# Patient Record
Sex: Male | Born: 1975 | Race: Black or African American | Hispanic: No | Marital: Married | Smoking: Current every day smoker
Health system: Southern US, Community
[De-identification: ages and names within clinical notes are randomized; demographics above are authoritative.]

## PROBLEM LIST (undated history)

## (undated) DIAGNOSIS — Z8719 Personal history of other diseases of the digestive system: Secondary | ICD-10-CM

## (undated) DIAGNOSIS — I10 Essential (primary) hypertension: Secondary | ICD-10-CM

## (undated) DIAGNOSIS — K7460 Unspecified cirrhosis of liver: Secondary | ICD-10-CM

## (undated) DIAGNOSIS — E119 Type 2 diabetes mellitus without complications: Secondary | ICD-10-CM

## (undated) DIAGNOSIS — K760 Fatty (change of) liver, not elsewhere classified: Secondary | ICD-10-CM

## (undated) DIAGNOSIS — K863 Pseudocyst of pancreas: Secondary | ICD-10-CM

## (undated) DIAGNOSIS — M1990 Unspecified osteoarthritis, unspecified site: Secondary | ICD-10-CM

## (undated) DIAGNOSIS — IMO0001 Reserved for inherently not codable concepts without codable children: Secondary | ICD-10-CM

## (undated) DIAGNOSIS — K859 Acute pancreatitis without necrosis or infection, unspecified: Secondary | ICD-10-CM

## (undated) DIAGNOSIS — K219 Gastro-esophageal reflux disease without esophagitis: Secondary | ICD-10-CM

## (undated) HISTORY — DX: Fatty (change of) liver, not elsewhere classified: K76.0

## (undated) HISTORY — DX: Reserved for inherently not codable concepts without codable children: IMO0001

## (undated) HISTORY — DX: Pseudocyst of pancreas: K86.3

## (undated) HISTORY — PX: NECK SURGERY: SHX720

## (undated) HISTORY — DX: Personal history of other diseases of the digestive system: Z87.19

## (undated) HISTORY — DX: Unspecified cirrhosis of liver: K74.60

## (undated) HISTORY — DX: Gastro-esophageal reflux disease without esophagitis: K21.9

## (undated) HISTORY — DX: Acute pancreatitis without necrosis or infection, unspecified: K85.90

## (undated) HISTORY — PX: SPINAL FUSION: SHX223

---

## 2013-02-19 ENCOUNTER — Emergency Department (HOSPITAL_COMMUNITY): Payer: BC Managed Care – PPO

## 2013-02-19 ENCOUNTER — Encounter (HOSPITAL_COMMUNITY): Payer: Self-pay

## 2013-02-19 ENCOUNTER — Emergency Department (HOSPITAL_COMMUNITY)
Admission: EM | Admit: 2013-02-19 | Discharge: 2013-02-20 | Disposition: A | Discharge status: Home / Self Care | Payer: BC Managed Care – PPO | Attending: Emergency Medicine | Admitting: Emergency Medicine

## 2013-02-19 DIAGNOSIS — Z79899 Other long term (current) drug therapy: Secondary | ICD-10-CM | POA: Insufficient documentation

## 2013-02-19 DIAGNOSIS — K2980 Duodenitis without bleeding: Secondary | ICD-10-CM

## 2013-02-19 DIAGNOSIS — F17200 Nicotine dependence, unspecified, uncomplicated: Secondary | ICD-10-CM | POA: Insufficient documentation

## 2013-02-19 DIAGNOSIS — I10 Essential (primary) hypertension: Secondary | ICD-10-CM | POA: Insufficient documentation

## 2013-02-19 DIAGNOSIS — E119 Type 2 diabetes mellitus without complications: Secondary | ICD-10-CM | POA: Insufficient documentation

## 2013-02-19 DIAGNOSIS — R109 Unspecified abdominal pain: Secondary | ICD-10-CM

## 2013-02-19 HISTORY — DX: Type 2 diabetes mellitus without complications: E11.9

## 2013-02-19 HISTORY — DX: Essential (primary) hypertension: I10

## 2013-02-19 LAB — URINALYSIS, ROUTINE W REFLEX MICROSCOPIC
Bilirubin Urine: NEGATIVE
Hgb urine dipstick: NEGATIVE
Nitrite: NEGATIVE
Protein, ur: NEGATIVE mg/dL
Urobilinogen, UA: 0.2 mg/dL (ref 0.0–1.0)

## 2013-02-19 LAB — CBC WITH DIFFERENTIAL/PLATELET
Eosinophils Absolute: 0.2 10*3/uL (ref 0.0–0.7)
Eosinophils Relative: 2 % (ref 0–5)
Hemoglobin: 15.6 g/dL (ref 13.0–17.0)
Lymphocytes Relative: 14 % (ref 12–46)
Lymphs Abs: 1 10*3/uL (ref 0.7–4.0)
MCH: 32.2 pg (ref 26.0–34.0)
MCV: 88.9 fL (ref 78.0–100.0)
Monocytes Relative: 8 % (ref 3–12)
Platelets: 223 10*3/uL (ref 150–400)
RBC: 4.85 MIL/uL (ref 4.22–5.81)
WBC: 7.2 10*3/uL (ref 4.0–10.5)

## 2013-02-19 LAB — COMPREHENSIVE METABOLIC PANEL
ALT: 28 U/L (ref 0–53)
Alkaline Phosphatase: 67 U/L (ref 39–117)
BUN: 12 mg/dL (ref 6–23)
CO2: 29 mEq/L (ref 19–32)
Calcium: 9.4 mg/dL (ref 8.4–10.5)
GFR calc Af Amer: >90 mL/min (ref >90)
GFR calc non Af Amer: 90 mL/min — ABNORMAL LOW (ref >90)
Glucose, Bld: 201 mg/dL — ABNORMAL HIGH (ref 70–99)
Potassium: 4 mEq/L (ref 3.5–5.1)
Sodium: 132 mEq/L — ABNORMAL LOW (ref 135–145)
Total Protein: 7.6 g/dL (ref 6.0–8.3)

## 2013-02-19 LAB — LIPASE, BLOOD: Lipase: 35 U/L (ref 11–59)

## 2013-02-19 MED ORDER — ONDANSETRON HCL 4 MG/2ML IJ SOLN
INTRAMUSCULAR | Status: AC
  Administered 2013-02-19: 4 mg
  Filled 2013-02-19: qty 2

## 2013-02-19 MED ORDER — ONDANSETRON HCL 4 MG/2ML IJ SOLN: 4.0000 mg | Freq: Once | INTRAMUSCULAR | Status: AC

## 2013-02-19 MED ORDER — MORPHINE SULFATE 4 MG/ML IJ SOLN
4.0000 mg | Freq: Once | INTRAMUSCULAR | Status: AC
  Administered 2013-02-19: 4 mg via INTRAVENOUS
  Filled 2013-02-19: qty 1

## 2013-02-19 MED ORDER — SODIUM CHLORIDE 0.9 % IV BOLUS (SEPSIS)
1000.0000 mL | Freq: Once | INTRAVENOUS | Status: AC
  Administered 2013-02-19: 1000 mL via INTRAVENOUS

## 2013-02-19 NOTE — ED Provider Notes (Signed)
History  This chart was scribed for Todd Speak, MD by Jenne Campus, ED Scribe. This patient was seen in room APA10/APA10 and the patient's care was started at 10:34 PM.  CSN: [id number]  Arrival date & time 02/19/13  2158   First MD Initiated Contact with Patient 02/19/13 2233      Chief Complaint  Patient presents with  . Abdominal Pain    The history is provided by the patient. No language interpreter was used.   HPI Comments: Todd Rodriguez is a 37 y.o. male who presents to the Emergency Department complaining of sudden-onset, constant abdominal pain in the RUQ described as a bloated feeling which began 2 days ago. Pt states that the pain radiates to the right side of his back, but he denies that the pain radiates to his groin. Pt reports mild constipation for which he took magnesium citrate about one hour ago. He denies emesis, diarrhea, hematuria, fever, and chills as associated symptoms.  He denies having any sick contacts with similar symptoms. Pt denies any prior abdominal surgeries. Pt also has a h/o of DM and HTN. He is a smoker but denies drinking.   Past Medical History  Diagnosis Date  . Diabetes mellitus without complication   . Hypertension     Past Surgical History  Procedure Laterality Date  . Spinal fusion      No family history on file.  History  Substance Use Topics  . Smoking status: Current Every Day Smoker  . Smokeless tobacco: Not on file  . Alcohol Use: No      Review of Systems  Constitutional: Negative for fever and chills.  Gastrointestinal: Positive for abdominal pain. Negative for nausea, vomiting and diarrhea.  Genitourinary: Negative for dysuria and hematuria.  Musculoskeletal: Positive for back pain (radiation from abdomen).  All other systems reviewed and are negative.    Allergies  Review of patient's allergies indicates no known allergies.  Home Medications   Current Outpatient Rx  Name  Route  Sig  Dispense  Refill  .  lisinopril-hydrochlorothiazide (PRINZIDE,ZESTORETIC) 20-25 MG per tablet   Oral   Take 1 tablet by mouth daily.         . metFORMIN (GLUCOPHAGE) 500 MG tablet   Oral   Take 500 mg by mouth 2 (two) times daily with a meal.         . Misc Natural Products (LAXATIVE FORMULA PO)   Oral   Take 1 tablet by mouth daily as needed (for relief).         . Naphazoline-Glycerin (REDNESS RELIEF) 0.012-0.25 % SOLN   Ophthalmic   Apply 1 drop to eye daily as needed (for redness and irritation).         Marland Kitchen omeprazole (PRILOSEC) 40 MG capsule   Oral   Take 40 mg by mouth daily.         . Simethicone (GAS-X EXTRA STRENGTH PO)   Oral   Take 1 tablet by mouth daily as needed (for relief).           Triage Vitals: BP 161/99  Pulse 91  Temp(Src) 98.5 F (36.9 C) (Oral)  Resp 20  Ht 6' 2"  (1.88 m)  Wt 230 lb (104.327 kg)  BMI 29.52 kg/m2  SpO2 100%  Physical Exam  Nursing note and vitals reviewed. Constitutional: He is oriented to person, place, and time. He appears well-developed and well-nourished. No distress.  HENT:  Head: Normocephalic and atraumatic.  Nose: Nose normal.  Mouth/Throat:  Oropharynx is clear and moist.  Eyes: Conjunctivae and EOM are normal. Pupils are equal, round, and reactive to light.  Neck: Normal range of motion. Neck supple. No tracheal deviation present.  Cardiovascular: Normal rate, regular rhythm and normal heart sounds.   Pulmonary/Chest: Effort normal. No respiratory distress.  Abdominal: Soft. Bowel sounds are normal. There is tenderness. There is no rebound and no guarding.  Tenderness to palpation in the RUQ and right flank. There are no rebound and no guarding. Bowel sounds are present.   Musculoskeletal: Normal range of motion.  Neurological: He is alert and oriented to person, place, and time.  Skin: Skin is warm and dry.  Psychiatric: He has a normal mood and affect. His behavior is normal.    ED Course  Procedures (including critical  care time)  DIAGNOSTIC STUDIES: Oxygen Saturation is 100% on room air, normal by my interpretation.    COORDINATION OF CARE:  10:37 PM Discussed treatment plan which includes CT scan and blood tests to determine if the pain could be due to gallbladder. Pt accepted offer for pain medication. Pt agreed to treatment plan.   Labs Reviewed  CBC WITH DIFFERENTIAL  COMPREHENSIVE METABOLIC PANEL  LIPASE, BLOOD  URINALYSIS, ROUTINE W REFLEX MICROSCOPIC   No results found.   No diagnosis found.    MDM  CT scan reveals inflammation either in the duodenum or uncinate of the pancreas.  As the lipase is normal, I favor duodenitis.  He is feeling better with the meds given and appears very stable for discharge.  I believe he need GI follow up and will supply him with the number for Dr. Gala Romney who is on call this evening.  To return prn for fever, hematemesis, or worsening pain.        I personally performed the services described in this documentation, which was scribed in my presence. The recorded information has been reviewed and is accurate.        Todd Speak, MD 02/20/13 [phone]

## 2013-02-19 NOTE — ED Notes (Addendum)
Pt c/o right side abd pain, denies n/v/d but states he feels like he might be constipated, took mag citrate about an hour ago.

## 2013-02-19 NOTE — ED Notes (Signed)
Patient complained of nausea immediately after administration of morphine. Dr. Stark Jock stated to give 4 mg of zofran IV.

## 2013-02-20 MED ORDER — MORPHINE SULFATE 4 MG/ML IJ SOLN
4.0000 mg | Freq: Once | INTRAMUSCULAR | Status: AC
  Administered 2013-02-20: 4 mg via INTRAVENOUS
  Filled 2013-02-20: qty 1

## 2013-02-20 MED ORDER — PANTOPRAZOLE SODIUM 20 MG PO TBEC: 20.0000 mg | DELAYED_RELEASE_TABLET | Freq: Two times a day (BID) | ORAL | Status: DC

## 2013-02-20 MED ORDER — OXYCODONE-ACETAMINOPHEN 5-325 MG PO TABS: 2.0000 | ORAL_TABLET | ORAL | Status: DC | PRN

## 2013-03-01 ENCOUNTER — Ambulatory Visit: Payer: BC Managed Care – PPO | Admitting: Gastroenterology

## 2013-03-16 ENCOUNTER — Ambulatory Visit: Payer: BC Managed Care – PPO | Admitting: Gastroenterology

## 2013-10-24 ENCOUNTER — Emergency Department (HOSPITAL_COMMUNITY): Payer: BC Managed Care – PPO

## 2013-10-24 ENCOUNTER — Telehealth: Payer: Self-pay

## 2013-10-24 ENCOUNTER — Emergency Department (HOSPITAL_COMMUNITY)
Admission: EM | Admit: 2013-10-24 | Discharge: 2013-10-24 | Disposition: A | Discharge status: Home / Self Care | Payer: BC Managed Care – PPO | Attending: Emergency Medicine | Admitting: Emergency Medicine

## 2013-10-24 ENCOUNTER — Other Ambulatory Visit: Payer: Self-pay

## 2013-10-24 ENCOUNTER — Encounter (HOSPITAL_COMMUNITY): Payer: Self-pay | Admitting: Emergency Medicine

## 2013-10-24 DIAGNOSIS — E119 Type 2 diabetes mellitus without complications: Secondary | ICD-10-CM | POA: Insufficient documentation

## 2013-10-24 DIAGNOSIS — K859 Acute pancreatitis without necrosis or infection, unspecified: Secondary | ICD-10-CM

## 2013-10-24 DIAGNOSIS — Z79899 Other long term (current) drug therapy: Secondary | ICD-10-CM | POA: Insufficient documentation

## 2013-10-24 DIAGNOSIS — I10 Essential (primary) hypertension: Secondary | ICD-10-CM | POA: Insufficient documentation

## 2013-10-24 DIAGNOSIS — F17200 Nicotine dependence, unspecified, uncomplicated: Secondary | ICD-10-CM | POA: Insufficient documentation

## 2013-10-24 LAB — COMPREHENSIVE METABOLIC PANEL
ALK PHOS: 71 U/L (ref 39–117)
ALT: 25 U/L (ref 0–53)
AST: 16 U/L (ref 0–37)
Albumin: 4.1 g/dL (ref 3.5–5.2)
BUN: 12 mg/dL (ref 6–23)
CHLORIDE: 95 meq/L — AB (ref 96–112)
CO2: 26 meq/L (ref 19–32)
CREATININE: 1.23 mg/dL (ref 0.50–1.35)
Calcium: 9.3 mg/dL (ref 8.4–10.5)
GFR calc non Af Amer: 73 mL/min — ABNORMAL LOW (ref >90)
GFR, EST AFRICAN AMERICAN: 84 mL/min — AB (ref >90)
Glucose, Bld: 174 mg/dL — ABNORMAL HIGH (ref 70–99)
Potassium: 3.8 mEq/L (ref 3.7–5.3)
SODIUM: 135 meq/L — AB (ref 137–147)
TOTAL PROTEIN: 7.5 g/dL (ref 6.0–8.3)
Total Bilirubin: 0.8 mg/dL (ref 0.3–1.2)

## 2013-10-24 LAB — CBC WITH DIFFERENTIAL/PLATELET
Basophils Absolute: 0 10*3/uL (ref 0.0–0.1)
Basophils Relative: 0 % (ref 0–1)
Eosinophils Absolute: 0.1 10*3/uL (ref 0.0–0.7)
Eosinophils Relative: 1 % (ref 0–5)
HCT: 44.4 % (ref 39.0–52.0)
HEMOGLOBIN: 15.6 g/dL (ref 13.0–17.0)
LYMPHS ABS: 1.2 10*3/uL (ref 0.7–4.0)
Lymphocytes Relative: 14 % (ref 12–46)
MCH: 31.6 pg (ref 26.0–34.0)
MCHC: 35.1 g/dL (ref 30.0–36.0)
MCV: 90.1 fL (ref 78.0–100.0)
MONOS PCT: 6 % (ref 3–12)
Monocytes Absolute: 0.5 10*3/uL (ref 0.1–1.0)
NEUTROS ABS: 6.9 10*3/uL (ref 1.7–7.7)
NEUTROS PCT: 79 % — AB (ref 43–77)
Platelets: 229 10*3/uL (ref 150–400)
RBC: 4.93 MIL/uL (ref 4.22–5.81)
RDW: 12.2 % (ref 11.5–15.5)
WBC: 8.7 10*3/uL (ref 4.0–10.5)

## 2013-10-24 LAB — TROPONIN I: Troponin I: <0.3 ng/mL (ref <0.30)

## 2013-10-24 LAB — LIPASE, BLOOD: LIPASE: 67 U/L — AB (ref 11–59)

## 2013-10-24 LAB — GLUCOSE, CAPILLARY: GLUCOSE-CAPILLARY: 191 mg/dL — AB (ref 70–99)

## 2013-10-24 MED ORDER — HYDROMORPHONE HCL PF 1 MG/ML IJ SOLN
1.0000 mg | Freq: Once | INTRAMUSCULAR | Status: AC
  Administered 2013-10-24: 1 mg via INTRAVENOUS
  Filled 2013-10-24: qty 1

## 2013-10-24 MED ORDER — IOHEXOL 300 MG/ML  SOLN
100.0000 mL | Freq: Once | INTRAMUSCULAR | Status: AC | PRN
  Administered 2013-10-24: 100 mL via INTRAVENOUS

## 2013-10-24 MED ORDER — IOHEXOL 300 MG/ML  SOLN
50.0000 mL | Freq: Once | INTRAMUSCULAR | Status: AC | PRN
  Administered 2013-10-24: 50 mL via ORAL

## 2013-10-24 MED ORDER — OXYCODONE-ACETAMINOPHEN 5-325 MG PO TABS: 1.0000 | ORAL_TABLET | ORAL | Status: DC | PRN

## 2013-10-24 MED ORDER — ONDANSETRON HCL 4 MG/2ML IJ SOLN
4.0000 mg | Freq: Once | INTRAMUSCULAR | Status: AC
  Administered 2013-10-24: 4 mg via INTRAVENOUS
  Filled 2013-10-24: qty 2

## 2013-10-24 MED ORDER — HYDROMORPHONE HCL PF 1 MG/ML IJ SOLN
1.0000 mg | Freq: Once | INTRAMUSCULAR | Status: AC
Start: 2013-10-24 — End: 2013-10-24
  Administered 2013-10-24: 1 mg via INTRAVENOUS
  Filled 2013-10-24: qty 1

## 2013-10-24 NOTE — Discharge Instructions (Signed)
PLEASE HOLD YOUR METFORMIN FOR TWO DAYS PLEASE AVOID IBUPROFEN OR NAPROXEN BE SURE TO FOLLOWUP FOR FURTHER EVALUATION BY GI DOCTOR  Acute Pancreatitis Acute pancreatitis is a disease in which the pancreas becomes suddenly inflamed. The pancreas is a large gland located behind your stomach. The pancreas produces enzymes that help digest food. The pancreas also releases the hormones glucagon and insulin that help regulate blood sugar. Damage to the pancreas occurs when the digestive enzymes from the pancreas are activated and begin attacking the pancreas before being released into the intestine. Most acute attacks last a couple of days and can cause serious complications. Some people become dehydrated and develop low blood pressure. In severe cases, bleeding into the pancreas can lead to shock and can be life-threatening. The lungs, heart, and kidneys may fail. CAUSES  Pancreatitis can happen to anyone. In some cases, the cause is unknown. Most cases are caused by:  Alcohol abuse.  Gallstones. Other less common causes are:  Certain medicines.  Exposure to certain chemicals.  Infection.  Damage caused by an accident (trauma).  Abdominal surgery. SYMPTOMS   Pain in the upper abdomen that may radiate to the back.  Tenderness and swelling of the abdomen.  Nausea and vomiting. DIAGNOSIS  Your caregiver will perform a physical exam. Blood and stool tests may be done to confirm the diagnosis. Imaging tests may also be done, such as X-rays, CT scans, or an ultrasound of the abdomen. TREATMENT  Treatment usually requires a stay in the hospital. Treatment may include:  Pain medicine.  Fluid replacement through an intravenous line (IV).  Placing a tube in the stomach to remove stomach contents and control vomiting.  Not eating for 3 or 4 days. This gives your pancreas a rest, because enzymes are not being produced that can cause further damage.  Antibiotic medicines if your condition is  caused by an infection.  Surgery of the pancreas or gallbladder. HOME CARE INSTRUCTIONS   Follow the diet advised by your caregiver. This may involve avoiding alcohol and decreasing the amount of fat in your diet.  Eat smaller, more frequent meals. This reduces the amount of digestive juices the pancreas produces.  Drink enough fluids to keep your urine clear or pale yellow.  Only take over-the-counter or prescription medicines as directed by your caregiver.  Avoid drinking alcohol if it caused your condition.  Do not smoke.  Get plenty of rest.  Check your blood sugar at home as directed by your caregiver.  Keep all follow-up appointments as directed by your caregiver. SEEK MEDICAL CARE IF:   You do not recover as quickly as expected.  You develop new or worsening symptoms.  You have persistent pain, weakness, or nausea.  You recover and then have another episode of pain. SEEK IMMEDIATE MEDICAL CARE IF:   You are unable to eat or keep fluids down.  Your pain becomes severe.  You have a fever or persistent symptoms for more than 2 to 3 days.  You have a fever and your symptoms suddenly get worse.  Your skin or the white part of your eyes turn yellow (jaundice).  You develop vomiting.  You feel dizzy, or you faint.  Your blood sugar is high (over 300 mg/dL). MAKE SURE YOU:   Understand these instructions.  Will watch your condition.  Will get help right away if you are not doing well or get worse. Document Released: 09/21/2005 Document Revised: 03/22/2012 Document Reviewed: 12/31/2011 [facility] Patient Information 2014 [place].

## 2013-10-24 NOTE — ED Notes (Addendum)
Pt c/o epigastric pain that radiates intermittent around right ribs/side since yesterday. N/v x 2 last night. Denies dizziness/sob. States pain is aching pain, rating 8. Denies cough. Nondiaphoretic. States had gotten sweaty. Alert/oriented. States eating makes pain worse. Seen at [facility] in [place] this am and sent here due to peaked t wave on EKG and pain no better with Gi cocktail.

## 2013-10-24 NOTE — Telephone Encounter (Signed)
Pt was seen today by the ER and they told him to follow up with our office in one week but we do not have anything until Feb 10. He has Acute Pancreatitis. Please advise what we can do for him he would be a new patient.

## 2013-10-24 NOTE — ED Provider Notes (Signed)
CSN: [id number]     Arrival date & time 10/24/13  1042 History  This chart was scribed for Todd Cable, MD by Roxan Diesel, ED scribe.  This patient was seen in room APA09/APA09 and the patient's care was started at 12:05 PM.   Chief Complaint  Patient presents with  . Abdominal Pain    Patient is a 38 y.o. male presenting with abdominal pain. The history is provided by the patient. No language interpreter was used.  Abdominal Pain Pain location:  Epigastric Pain radiates to:  R flank Pain severity:  Severe Duration:  20 hours Timing:  Constant Chronicity:  New Context: awakening from sleep   Associated symptoms: nausea and vomiting     HPI Comments: Alphonso Gregson is a 38 y.o. male with h/o DM and HTN who presents to the Emergency Department complaining of constant severe epigastric abdominal pain that began yesterday around 4 PM when pt woke up from a nap.  Pt states the pain radiates intermittently around right ribs/side.  He also complains of nausea and 2 episodes of vomiting last night.  He denies fever, vomiting, pain or swelling in legs, SOB, dysuria, or black or bloody stool.He denies h/o abdominal surgery or known gallbladder issues.  He denies h/o heart problems.  He denies drinking alcohol recently.     Past Medical History  Diagnosis Date  . Diabetes mellitus without complication   . Hypertension     Past Surgical History  Procedure Laterality Date  . Spinal fusion      History reviewed. No pertinent family history.   History  Substance Use Topics  . Smoking status: Current Every Day Smoker  . Smokeless tobacco: Not on file  . Alcohol Use: No     Review of Systems  Gastrointestinal: Positive for nausea, vomiting and abdominal pain.  All other systems reviewed and are negative.     Allergies  Review of patient's allergies indicates not on file.  Home Medications   Current Outpatient Rx  Name  Route  Sig  Dispense  Refill  .  lisinopril-hydrochlorothiazide (PRINZIDE,ZESTORETIC) 20-25 MG per tablet   Oral   Take 1 tablet by mouth daily.         . metFORMIN (GLUCOPHAGE) 500 MG tablet   Oral   Take 500 mg by mouth 2 (two) times daily with a meal.         . omeprazole (PRILOSEC) 40 MG capsule   Oral   Take 40 mg by mouth daily.          BP 164/94  Pulse 76  Temp(Src) 98.3 F (36.8 C) (Oral)  Resp 16  Ht 6' 1.5" (1.867 m)  Wt 220 lb (99.791 kg)  BMI 28.63 kg/m2  SpO2 100%   Physical Exam  Nursing note and vitals reviewed. CONSTITUTIONAL: Well developed/well nourished HEAD: Normocephalic/atraumatic EYES: EOMI/PERRL ENMT: Mucous membranes moist NECK: supple no meningeal signs SPINE:entire spine nontender CV: S1/S2 noted, no murmurs/rubs/gallops noted LUNGS: Lungs are clear to auscultation bilaterally, no apparent distress ABDOMEN: soft, moderate RUQ tenderness, no rebound or guarding GU:no cva tenderness NEURO: Pt is awake/alert, moves all extremitiesx4 EXTREMITIES: pulses normal, full ROM SKIN: warm, color normal PSYCH: no abnormalities of mood noted    ED Course  Procedures (including critical care time)  DIAGNOSTIC STUDIES: Oxygen Saturation is 100% on room air, normal by my interpretation.    COORDINATION OF CARE: 12:10 PM-Discussed treatment plan which includes pain medication, anti-emetics, EKG, US abdomen, CXR, and labs with  pt at bedside and pt agreed to plan.  Reviewed ekg from urgent care visit His issues seems mostly abdominal in nature, and I doubt ACS at this time 1:56 PM [place] negative for acute disease He now has RLQ tenderness, will get CT imaging  4:04 PM Ct SHOWS ?PANCREATITIS/DUODENITIS (D/W RADIOLOGY) HE WILL NEED F/U FOR FURTHER EVALUATION AND MAY NEED FURTHER EVALUATION FOR POTENTIAL CANCER EVALUATION (PT WAS INFORMED OF THIS) HE IS FEELING IMPROVED AND WANTS TO GO HOME HE WAS GIVEN PAIN MEDS AND GIVEN GI REFERRAL PT/FAMILY AGREEABLE WITH PLAN  Labs  Review Labs Reviewed  GLUCOSE, CAPILLARY - Abnormal; Notable for the following:    Glucose-Capillary 191 (*)    All other components within normal limits  CBC WITH DIFFERENTIAL - Abnormal; Notable for the following:    Neutrophils Relative % 79 (*)    All other components within normal limits  LIPASE, BLOOD - Abnormal; Notable for the following:    Lipase 67 (*)    All other components within normal limits  COMPREHENSIVE METABOLIC PANEL - Abnormal; Notable for the following:    Sodium 135 (*)    Chloride 95 (*)    Glucose, Bld 174 (*)    GFR calc non Af Amer 73 (*)    GFR calc Af Amer 84 (*)    All other components within normal limits  TROPONIN I    Imaging Review No results found.   EKG Interpretation    Date/Time:  Tuesday October 24 2013 10:39:50 EST Ventricular Rate:  79 PR Interval:  138 QRS Duration: 80 QT Interval:  368 QTC Calculation: 421 R Axis:   68 Text Interpretation:  Normal sinus rhythm with sinus arrhythmia Normal ECG No previous ECGs available Confirmed by Christy Gentles  MD, [place] ([id number]) on 10/24/2013 11:36:12 AM            MDM  No diagnosis found. Nursing notes including past medical history and social history reviewed and considered in documentation Labs/vital reviewed and considered Previous records reviewed and considered     I personally performed the services described in this documentation, which was scribed in my presence. The recorded information has been reviewed and is accurate.      Todd Cable, MD 10/24/13 [phone]

## 2013-10-25 ENCOUNTER — Inpatient Hospital Stay (HOSPITAL_COMMUNITY)
Admission: EM | Admit: 2013-10-25 | Discharge: 2013-10-29 | DRG: 440 | Disposition: A | Discharge status: Home / Self Care | Payer: BC Managed Care – PPO | Attending: Internal Medicine | Admitting: Internal Medicine

## 2013-10-25 ENCOUNTER — Encounter (HOSPITAL_COMMUNITY): Payer: Self-pay | Admitting: Emergency Medicine

## 2013-10-25 DIAGNOSIS — I10 Essential (primary) hypertension: Secondary | ICD-10-CM

## 2013-10-25 DIAGNOSIS — K861 Other chronic pancreatitis: Secondary | ICD-10-CM | POA: Diagnosis present

## 2013-10-25 DIAGNOSIS — D72829 Elevated white blood cell count, unspecified: Secondary | ICD-10-CM

## 2013-10-25 DIAGNOSIS — Z981 Arthrodesis status: Secondary | ICD-10-CM

## 2013-10-25 DIAGNOSIS — E119 Type 2 diabetes mellitus without complications: Secondary | ICD-10-CM | POA: Diagnosis present

## 2013-10-25 DIAGNOSIS — K219 Gastro-esophageal reflux disease without esophagitis: Secondary | ICD-10-CM | POA: Diagnosis present

## 2013-10-25 DIAGNOSIS — K859 Acute pancreatitis without necrosis or infection, unspecified: Secondary | ICD-10-CM

## 2013-10-25 DIAGNOSIS — IMO0001 Reserved for inherently not codable concepts without codable children: Secondary | ICD-10-CM

## 2013-10-25 DIAGNOSIS — F17200 Nicotine dependence, unspecified, uncomplicated: Secondary | ICD-10-CM | POA: Diagnosis present

## 2013-10-25 DIAGNOSIS — E1165 Type 2 diabetes mellitus with hyperglycemia: Secondary | ICD-10-CM

## 2013-10-25 HISTORY — DX: Acute pancreatitis without necrosis or infection, unspecified: K85.90

## 2013-10-25 LAB — URINALYSIS, ROUTINE W REFLEX MICROSCOPIC
Glucose, UA: NEGATIVE mg/dL
HGB URINE DIPSTICK: NEGATIVE
Ketones, ur: NEGATIVE mg/dL
Leukocytes, UA: NEGATIVE
Nitrite: NEGATIVE
Specific Gravity, Urine: >1.03 — ABNORMAL HIGH (ref 1.005–1.030)
UROBILINOGEN UA: 0.2 mg/dL (ref 0.0–1.0)
pH: 6 (ref 5.0–8.0)

## 2013-10-25 LAB — GLUCOSE, CAPILLARY
GLUCOSE-CAPILLARY: 155 mg/dL — AB (ref 70–99)
Glucose-Capillary: 174 mg/dL — ABNORMAL HIGH (ref 70–99)

## 2013-10-25 LAB — CBC WITH DIFFERENTIAL/PLATELET
Basophils Absolute: 0 10*3/uL (ref 0.0–0.1)
Basophils Relative: 0 % (ref 0–1)
EOS PCT: 0 % (ref 0–5)
Eosinophils Absolute: 0 10*3/uL (ref 0.0–0.7)
HEMATOCRIT: 44.7 % (ref 39.0–52.0)
HEMOGLOBIN: 15.9 g/dL (ref 13.0–17.0)
LYMPHS ABS: 0.7 10*3/uL (ref 0.7–4.0)
Lymphocytes Relative: 6 % — ABNORMAL LOW (ref 12–46)
MCH: 32.1 pg (ref 26.0–34.0)
MCHC: 35.6 g/dL (ref 30.0–36.0)
MCV: 90.1 fL (ref 78.0–100.0)
MONOS PCT: 6 % (ref 3–12)
Monocytes Absolute: 0.7 10*3/uL (ref 0.1–1.0)
Neutro Abs: 11.1 10*3/uL — ABNORMAL HIGH (ref 1.7–7.7)
Neutrophils Relative %: 89 % — ABNORMAL HIGH (ref 43–77)
Platelets: 209 10*3/uL (ref 150–400)
RBC: 4.96 MIL/uL (ref 4.22–5.81)
RDW: 12.1 % (ref 11.5–15.5)
WBC: 12.5 10*3/uL — ABNORMAL HIGH (ref 4.0–10.5)

## 2013-10-25 LAB — COMPREHENSIVE METABOLIC PANEL
ALK PHOS: 65 U/L (ref 39–117)
ALT: 21 U/L (ref 0–53)
AST: 15 U/L (ref 0–37)
Albumin: 4 g/dL (ref 3.5–5.2)
BUN: 8 mg/dL (ref 6–23)
CALCIUM: 9.3 mg/dL (ref 8.4–10.5)
CO2: 28 mEq/L (ref 19–32)
Chloride: 92 mEq/L — ABNORMAL LOW (ref 96–112)
Creatinine, Ser: 1.06 mg/dL (ref 0.50–1.35)
GFR, EST NON AFRICAN AMERICAN: 87 mL/min — AB (ref >90)
GLUCOSE: 190 mg/dL — AB (ref 70–99)
Potassium: 4.1 mEq/L (ref 3.7–5.3)
Sodium: 134 mEq/L — ABNORMAL LOW (ref 137–147)
TOTAL PROTEIN: 7.9 g/dL (ref 6.0–8.3)
Total Bilirubin: 0.7 mg/dL (ref 0.3–1.2)

## 2013-10-25 LAB — TROPONIN I: Troponin I: <0.3 ng/mL (ref <0.30)

## 2013-10-25 LAB — HEMOGLOBIN A1C
Hgb A1c MFr Bld: 7.1 % — ABNORMAL HIGH (ref <5.7)
Mean Plasma Glucose: 157 mg/dL — ABNORMAL HIGH (ref <117)

## 2013-10-25 LAB — CG4 I-STAT (LACTIC ACID): LACTIC ACID, VENOUS: 1.6 mmol/L (ref 0.5–2.2)

## 2013-10-25 LAB — LIPASE, BLOOD: LIPASE: 158 U/L — AB (ref 11–59)

## 2013-10-25 LAB — URINE MICROSCOPIC-ADD ON: Urine-Other: NONE SEEN

## 2013-10-25 MED ORDER — SODIUM CHLORIDE 0.9 % IV SOLN: INTRAVENOUS | Status: DC

## 2013-10-25 MED ORDER — PANTOPRAZOLE SODIUM 40 MG IV SOLR
40.0000 mg | Freq: Two times a day (BID) | INTRAVENOUS | Status: DC
  Administered 2013-10-25 – 2013-10-27 (×4): 40 mg via INTRAVENOUS
  Filled 2013-10-25 (×4): qty 40

## 2013-10-25 MED ORDER — GI COCKTAIL ~~LOC~~
ORAL | Status: AC
  Filled 2013-10-25: qty 30

## 2013-10-25 MED ORDER — INSULIN ASPART 100 UNIT/ML ~~LOC~~ SOLN
0.0000 [IU] | Freq: Three times a day (TID) | SUBCUTANEOUS | Status: DC
  Administered 2013-10-25 – 2013-10-26 (×3): 2 [IU] via SUBCUTANEOUS
  Administered 2013-10-26 – 2013-10-29 (×6): 1 [IU] via SUBCUTANEOUS

## 2013-10-25 MED ORDER — MORPHINE SULFATE 2 MG/ML IJ SOLN
1.0000 mg | INTRAMUSCULAR | Status: DC | PRN
  Administered 2013-10-25 (×2): 1 mg via INTRAVENOUS
  Filled 2013-10-25 (×2): qty 1

## 2013-10-25 MED ORDER — GI COCKTAIL ~~LOC~~
30.0000 mL | Freq: Two times a day (BID) | ORAL | Status: DC | PRN
  Administered 2013-10-27: 30 mL via ORAL
  Filled 2013-10-25: qty 30

## 2013-10-25 MED ORDER — DEXTROSE-NACL 5-0.9 % IV SOLN
INTRAVENOUS | Status: DC
  Administered 2013-10-25 – 2013-10-26 (×3): via INTRAVENOUS

## 2013-10-25 MED ORDER — HYDROMORPHONE HCL PF 1 MG/ML IJ SOLN
1.0000 mg | INTRAMUSCULAR | Status: DC | PRN
  Administered 2013-10-25 – 2013-10-29 (×18): 1 mg via INTRAVENOUS
  Filled 2013-10-25 (×18): qty 1

## 2013-10-25 MED ORDER — PANTOPRAZOLE SODIUM 40 MG IV SOLR
40.0000 mg | INTRAVENOUS | Status: DC
  Administered 2013-10-25: 40 mg via INTRAVENOUS
  Filled 2013-10-25: qty 40

## 2013-10-25 MED ORDER — ONDANSETRON HCL 4 MG/2ML IJ SOLN
4.0000 mg | Freq: Once | INTRAMUSCULAR | Status: AC
  Administered 2013-10-25: 4 mg via INTRAVENOUS
  Filled 2013-10-25: qty 2

## 2013-10-25 MED ORDER — LISINOPRIL 10 MG PO TABS
20.0000 mg | ORAL_TABLET | Freq: Every day | ORAL | Status: DC
  Administered 2013-10-26 – 2013-10-29 (×4): 20 mg via ORAL
  Filled 2013-10-25 (×4): qty 2

## 2013-10-25 MED ORDER — HYDROMORPHONE HCL PF 1 MG/ML IJ SOLN
1.0000 mg | Freq: Once | INTRAMUSCULAR | Status: AC
  Administered 2013-10-25: 1 mg via INTRAVENOUS
  Filled 2013-10-25: qty 1

## 2013-10-25 MED ORDER — OXYCODONE-ACETAMINOPHEN 5-325 MG PO TABS
1.0000 | ORAL_TABLET | ORAL | Status: DC | PRN
  Administered 2013-10-26 – 2013-10-28 (×8): 1 via ORAL
  Filled 2013-10-25 (×8): qty 1

## 2013-10-25 MED ORDER — ONDANSETRON HCL 4 MG/2ML IJ SOLN
4.0000 mg | Freq: Three times a day (TID) | INTRAMUSCULAR | Status: AC | PRN
Start: 2013-10-25 — End: 2013-10-26

## 2013-10-25 MED ORDER — SODIUM CHLORIDE 0.9 % IV BOLUS (SEPSIS)
1000.0000 mL | Freq: Once | INTRAVENOUS | Status: AC
  Administered 2013-10-25: 1000 mL via INTRAVENOUS

## 2013-10-25 MED ORDER — ENOXAPARIN SODIUM 40 MG/0.4ML ~~LOC~~ SOLN
40.0000 mg | SUBCUTANEOUS | Status: DC
  Administered 2013-10-25 – 2013-10-29 (×5): 40 mg via SUBCUTANEOUS
  Filled 2013-10-25 (×5): qty 0.4

## 2013-10-25 MED ORDER — HYDROMORPHONE HCL PF 1 MG/ML IJ SOLN
1.0000 mg | INTRAMUSCULAR | Status: DC | PRN
  Filled 2013-10-25: qty 1

## 2013-10-25 NOTE — ED Notes (Signed)
Abdominal pain to entire right side with vomiting since Monday evening. Seen here yesterday for same. EDP was wanting to admit pt but pt did not wish to stay at the time.

## 2013-10-25 NOTE — Telephone Encounter (Signed)
OK to use urgent but prefer my Tuesday, Thursday, or Friday of next week.

## 2013-10-25 NOTE — ED Notes (Signed)
Patient unable to provide a urine specimen at this time.

## 2013-10-25 NOTE — H&P (Signed)
[facility] History and Physical  Todd Rodriguez VWU:[id number] DOB: 03-01-1976 DOA: 10/25/2013 Referring physician: EDP PCP: Todd Dawson, MD   Chief Complaint: abdominal pain since sunday  HPI: Todd Rodriguez is a 38 y.o. male with prior h/o pancreatitis, continues to take alcohol occasionally, comes in for worsening epigastric abdominal pain. On arrival to ED, labs revealed elevated lipase and CT abd evident with pancreatitis. He was referred to medical service for admission.    Review of Systems:  Constitutional:  No weight loss, night sweats, Fevers, chills, fatigue.  HEENT:  No headaches, Difficulty swallowing,Tooth/dental problems,Sore throat,  No sneezing, itching, ear ache, nasal congestion, post nasal drip,  Cardio-vascular:  No chest pain, Orthopnea, PND, swelling in lower extremities, anasarca, dizziness, palpitations  GI:  Positive for abd pain, nausea, vomiting.  Resp:  No shortness of breath with exertion or at rest. No excess mucus, no productive cough, No non-productive cough, No coughing up of blood.No change in color of mucus.No wheezing.No chest wall deformity  Skin:  no rash or lesions.  GU:  no dysuria, change in color of urine, no urgency or frequency. No flank pain.  Musculoskeletal:  No joint pain or swelling. No decreased range of motion. No back pain.  Psych:  No change in mood or affect. No depression or anxiety. No memory loss.   Past Medical History  Diagnosis Date  . Diabetes mellitus without complication   . Hypertension   . Pancreatitis    Past Surgical History  Procedure Laterality Date  . Spinal fusion     Social History:  reports that he has been smoking.  He does not have any smokeless tobacco history on file. He reports that he does not drink alcohol or use illicit drugs.  No Known Allergies  No family history on file.   Prior to Admission medications   Medication Sig Start Date End Date Taking? Authorizing Provider   lisinopril-hydrochlorothiazide (PRINZIDE,ZESTORETIC) 20-25 MG per tablet Take 1 tablet by mouth daily.   Yes Historical Provider, MD  metFORMIN (GLUCOPHAGE) 500 MG tablet Take 500 mg by mouth 2 (two) times daily with a meal.   Yes Historical Provider, MD  omeprazole (PRILOSEC) 40 MG capsule Take 40 mg by mouth daily.   Yes Historical Provider, MD  oxyCODONE-acetaminophen (PERCOCET/ROXICET) 5-325 MG per tablet Take 1 tablet by mouth every 4 (four) hours as needed for severe pain. 10/24/13  Yes Sharyon Cable, MD   Physical Exam: Filed Vitals:   10/25/13 1100  BP: 152/91  Pulse: 92  Temp:   Resp:     BP 152/91  Pulse 92  Temp(Src) 98.7 F (37.1 C) (Oral)  Resp 20  Ht 6' 1.5" (1.867 m)  Wt 103.42 kg (228 lb)  BMI 29.67 kg/m2  SpO2 100%  General:  Appears calm and comfortable Eyes: PERRL, normal lids, irises & conjunctiva ENT: grossly normal hearing, lips & tongue Neck: no LAD, masses or thyromegaly Cardiovascular: RRR, no m/r/g. No LE edema. Telemetry: SR, no arrhythmias  Respiratory: CTA bilaterally, no w/r/r. Normal respiratory effort. Abdomen: soft, tender in the epigastric area more.  Skin: no rash or induration seen on limited exam Musculoskeletal: grossly normal tone BUE/BLE Psychiatric: grossly normal mood and affect, speech fluent and appropriate Neurologic: grossly non-focal.          Labs on Admission:  Basic Metabolic Panel:  Recent Labs Lab 10/24/13 1125 10/25/13 1012  NA 135* 134*  K 3.8 4.1  CL 95* 92*  CO2 26 28  GLUCOSE  174* 190*  BUN 12 8  CREATININE 1.23 1.06  CALCIUM 9.3 9.3   Liver Function Tests:  Recent Labs Lab 10/24/13 1125 10/25/13 1012  AST 16 15  ALT 25 21  ALKPHOS 71 65  BILITOT 0.8 0.7  PROT 7.5 7.9  ALBUMIN 4.1 4.0    Recent Labs Lab 10/24/13 1125 10/25/13 1012  LIPASE 67* 158*   No results found for this basename: AMMONIA,  in the last 168 hours CBC:  Recent Labs Lab 10/24/13 1125 10/25/13 1012  WBC 8.7  12.5*  NEUTROABS 6.9 11.1*  HGB 15.6 15.9  HCT 44.4 44.7  MCV 90.1 90.1  PLT 229 209   Cardiac Enzymes:  Recent Labs Lab 10/24/13 1125 10/25/13 1012  TROPONINI <0.30 <0.30    BNP (last 3 results) No results found for this basename: PROBNP,  in the last 8760 hours CBG:  Recent Labs Lab 10/24/13 1056  GLUCAP 191*    Radiological Exams on Admission: US Abdomen Complete  10/24/2013   CLINICAL DATA:  Abdominal pain.  EXAM: ULTRASOUND ABDOMEN COMPLETE  COMPARISON:  CT, 02/19/2013  FINDINGS: Gallbladder:  No gallstones or wall thickening visualized. No sonographic Murphy sign noted.  Common bile duct:  Diameter: 4.1 mm  Liver:  Liver is mildly enlarged there is increased liver echogenicity consistent with fatty infiltration. Focal fatty sparing is noted along the gallbladder fossa. No liver masses. Hepatopetal flow was documented in the portal vein.  IVC:  No abnormality visualized.  Pancreas:  Visualized portion unremarkable.  Spleen:  Size and appearance within normal limits.  Right Kidney:  Length: 11.9 cm. Echogenicity within normal limits. No mass or hydronephrosis visualized.  Left Kidney:  Length: 11.0 cm. Echogenicity within normal limits. No mass or hydronephrosis visualized.  Abdominal aorta:  No aneurysm visualized.  Other findings:  None.  IMPRESSION: 1. Hepatic steatosis with focal fatty sparing along the gallbladder fossa. 2. No other abnormalities.  No acute findings.   Electronically Signed   By: Lajean Manes M.D.   On: 10/24/2013 13:23   Ct Abdomen Pelvis W Contrast  10/24/2013   CLINICAL DATA:  Intermittent epigastric pain with nausea and vomiting. Diabetes. Hypertension.  EXAM: CT ABDOMEN AND PELVIS WITH CONTRAST  TECHNIQUE: Multidetector CT imaging of the abdomen and pelvis was performed using the standard protocol following bolus administration of intravenous contrast.  CONTRAST:  26m OMNIPAQUE IOHEXOL 300 MG/ML SOLN, 109mOMNIPAQUE IOHEXOL 300 MG/ML SOLN  COMPARISON:   [place] COMPLE[place] dated 10/24/2013; CT ABD/PELV WO CM dated 02/19/2013  FINDINGS: Mild airway thickening observed in both lower lobes. There is abnormal hypodensity within or along the head of the pancreas and uncinate process with abnormal edema and stranding in this vicinity is and tracking around the descending duodenum and proximal transverse duodenum. The remainder the pancreas appears normal, without generalized peripancreatic stranding. The extrahepatic bile ducts are obscured by the surrounding edema/stranding. There is no intrahepatic biliary dilatation, and the gallbladder does not appear to be the epicenter of the inflammatory process.  Adrenal glands, spleen, and liver unremarkable. Kidneys and proximal ureters unremarkable. No pathologic upper abdominal adenopathy is observed. Mild aortoiliac atherosclerosis noted.  Appendix normal. No dilated bowel. No free pelvic fluid. Urinary bladder normal.  IMPRESSION: 1. Abnormal continued appearance of stranding/edema in the pancreaticoduodenal groove with surrounding stranding mildly increased compared to the prior exam. 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 Differential diagnostic considerations include adenocarcinoma in  the head of the pancreas and duodenal ulceration or inflamed duodenal diverticulum in the periampullary region. The likelihood of adenocarcinoma seems remote given the patient's relatively young age.   Electronically Signed   By: Sherryl Barters M.D.   On: 10/24/2013 15:43   Dg Chest Portable 1 View  10/24/2013   CLINICAL DATA:  Chest pain since yesterday.  EXAM: PORTABLE CHEST - 1 VIEW  COMPARISON:  None.  FINDINGS: Heart size and mediastinal contours are within normal limits. Both lungs are clear. Visualized skeletal structures are unremarkable.  IMPRESSION: Negative exam.   Electronically Signed   By: Inge Rise M.D.   On:  10/24/2013 13:02      Assessment/Plan Active Problems:   Pancreatitis   Acute pancreatitis   Acute pancreatitis: Admit to med surg. NPO, bowel rest IV fluids with dextrose. Pain control IV protonix.  Stop HCTZ.   Diabetes Mellitus: hgba1c  SSI.  Holding metformin On dextrose fluids.   Hypertension; On lisinopril, holding HCTZ in view of the acute pancreatitis.    DVT prophylaxis.    Code Status: full code Family Communication:none at bedside. Discussed th eplan of care with the patient.  Disposition Plan: pending.   Time spent: 65 min  [facility] Pager [phone]

## 2013-10-25 NOTE — Progress Notes (Signed)
10/25/13 1825 Patient states abdominal pain unrelieved with morphine as ordered. Rates abdominal pain as 6/10 on pain scale of 0-10. States dilaudid relieved pain while in ER. C/o acid reflux, requested medication if possible. Patient received protonix 40 mg IV as ordered on admission. Text-paged Dr. Karleen Hampshire to notify of patient requests. Donavan Foil, RN

## 2013-10-25 NOTE — Telephone Encounter (Signed)
LMOM to call back

## 2013-10-25 NOTE — Telephone Encounter (Signed)
Put in an appt next week or 2. E 30.

## 2013-10-25 NOTE — ED Provider Notes (Signed)
CSN: [id number]     Arrival date & time 10/25/13  0917 History  This chart was scribed for Carmin Muskrat, MD by Rolanda Lundborg, ED Scribe. This patient was seen in room APA08/APA08 and the patient's care was started at 9:30 AM.    Chief Complaint  Patient presents with  . Abdominal Pain   The history is provided by the patient. No language interpreter was used.   HPI Comments: Boniface Goffe is a 38 y.o. male with a h/o pancreatitis, DM, HTN who presents to the Emergency Department complaining of worsening upper abdominal pain that radiates to the back onset 2 days ago. Pt was seen here yesterday for the same. EDP Dr Christy Gentles wanted to admit pt but pt did not wish to stay. Pt states he is back because he has been unable to manage the pain at home. Wife reports confusion. He is on metformin and lisinopril.    Past Medical History  Diagnosis Date  . Diabetes mellitus without complication   . Hypertension   . Pancreatitis    Past Surgical History  Procedure Laterality Date  . Spinal fusion     No family history on file. History  Substance Use Topics  . Smoking status: Current Every Day Smoker  . Smokeless tobacco: Not on file  . Alcohol Use: No    Review of Systems  Constitutional:       Per HPI, otherwise negative  HENT:       Per HPI, otherwise negative  Respiratory:       Per HPI, otherwise negative  Cardiovascular:       Per HPI, otherwise negative  Gastrointestinal: Negative for vomiting.  Endocrine:       Negative aside from HPI  Genitourinary:       Neg aside from HPI   Musculoskeletal:       Per HPI, otherwise negative  Skin: Negative.   Neurological: Negative for syncope.    Allergies  Review of patient's allergies indicates no known allergies.  Home Medications   Current Outpatient Rx  Name  Route  Sig  Dispense  Refill  . lisinopril-hydrochlorothiazide (PRINZIDE,ZESTORETIC) 20-25 MG per tablet   Oral   Take 1 tablet by mouth daily.         .  metFORMIN (GLUCOPHAGE) 500 MG tablet   Oral   Take 500 mg by mouth 2 (two) times daily with a meal.         . omeprazole (PRILOSEC) 40 MG capsule   Oral   Take 40 mg by mouth daily.         Marland Kitchen oxyCODONE-acetaminophen (PERCOCET/ROXICET) 5-325 MG per tablet   Oral   Take 1 tablet by mouth every 4 (four) hours as needed for severe pain.   15 tablet   0    BP 177/97  Pulse 99  Temp(Src) 98.7 F (37.1 C) (Oral)  Resp 20  Ht 6' 1.5" (1.867 m)  Wt 228 lb (103.42 kg)  BMI 29.67 kg/m2  SpO2 100% Physical Exam  Nursing note and vitals reviewed. Constitutional: He is oriented to person, place, and time. He appears well-developed. No distress.  HENT:  Head: Normocephalic and atraumatic.  Eyes: Conjunctivae and EOM are normal.  Cardiovascular: Normal rate and regular rhythm.   Pulmonary/Chest: Effort normal. No stridor. No respiratory distress. He has no wheezes.  Abdominal: Bowel sounds are normal. He exhibits no distension. There is tenderness (epigastric). There is guarding.  Musculoskeletal: He exhibits no edema.  Neurological: He  is alert and oriented to person, place, and time.  Skin: Skin is warm and dry.  Psychiatric: He has a normal mood and affect.    ED Course  Procedures (including critical care time) Medications - No data to display  DIAGNOSTIC STUDIES: Oxygen Saturation is 100% on RA, normal by my interpretation.    COORDINATION OF CARE: 9:49 AM- Discussed treatment plan with pt which includes blood work and pain management. Pt agrees to plan.    Labs Review Labs Reviewed  CBC WITH DIFFERENTIAL - Abnormal; Notable for the following:    WBC 12.5 (*)    Neutrophils Relative % 89 (*)    Neutro Abs 11.1 (*)    Lymphocytes Relative 6 (*)    All other components within normal limits  COMPREHENSIVE METABOLIC PANEL - Abnormal; Notable for the following:    Sodium 134 (*)    Chloride 92 (*)    Glucose, Bld 190 (*)    GFR calc non Af Amer 87 (*)    All other  components within normal limits  LIPASE, BLOOD - Abnormal; Notable for the following:    Lipase 158 (*)    All other components within normal limits  TROPONIN I  URINALYSIS, ROUTINE W REFLEX MICROSCOPIC  CG4 I-STAT (LACTIC ACID)   Imaging Review US Abdomen Complete  10/24/2013   CLINICAL DATA:  Abdominal pain.  EXAM: ULTRASOUND ABDOMEN COMPLETE  COMPARISON:  CT, 02/19/2013  FINDINGS: Gallbladder:  No gallstones or wall thickening visualized. No sonographic Murphy sign noted.  Common bile duct:  Diameter: 4.1 mm  Liver:  Liver is mildly enlarged there is increased liver echogenicity consistent with fatty infiltration. Focal fatty sparing is noted along the gallbladder fossa. No liver masses. Hepatopetal flow was documented in the portal vein.  IVC:  No abnormality visualized.  Pancreas:  Visualized portion unremarkable.  Spleen:  Size and appearance within normal limits.  Right Kidney:  Length: 11.9 cm. Echogenicity within normal limits. No mass or hydronephrosis visualized.  Left Kidney:  Length: 11.0 cm. Echogenicity within normal limits. No mass or hydronephrosis visualized.  Abdominal aorta:  No aneurysm visualized.  Other findings:  None.  IMPRESSION: 1. Hepatic steatosis with focal fatty sparing along the gallbladder fossa. 2. No other abnormalities.  No acute findings.   Electronically Signed   By: Lajean Manes M.D.   On: 10/24/2013 13:23   Ct Abdomen Pelvis W Contrast  10/24/2013   CLINICAL DATA:  Intermittent epigastric pain with nausea and vomiting. Diabetes. Hypertension.  EXAM: CT ABDOMEN AND PELVIS WITH CONTRAST  TECHNIQUE: Multidetector CT imaging of the abdomen and pelvis was performed using the standard protocol following bolus administration of intravenous contrast.  CONTRAST:  25m OMNIPAQUE IOHEXOL 300 MG/ML SOLN, 1044mOMNIPAQUE IOHEXOL 300 MG/ML SOLN  COMPARISON:  [place] COM[place] dated 10/24/2013; CT ABD/PELV WO CM dated 02/19/2013  FINDINGS: Mild airway thickening observed in  both lower lobes. There is abnormal hypodensity within or along the head of the pancreas and uncinate process with abnormal edema and stranding in this vicinity is and tracking around the descending duodenum and proximal transverse duodenum. The remainder the pancreas appears normal, without generalized peripancreatic stranding. The extrahepatic bile ducts are obscured by the surrounding edema/stranding. There is no intrahepatic biliary dilatation, and the gallbladder does not appear to be the epicenter of the inflammatory process.  Adrenal glands, spleen, and liver unremarkable. Kidneys and proximal ureters unremarkable. No pathologic upper abdominal adenopathy is observed. Mild aortoiliac atherosclerosis noted.  Appendix normal.  No dilated bowel. No free pelvic fluid. Urinary bladder normal.  IMPRESSION: 1. Abnormal continued appearance of stranding/edema in the pancreaticoduodenal groove with surrounding stranding mildly increased compared to the prior exam. 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 Differential diagnostic considerations include adenocarcinoma in the head of the pancreas and duodenal ulceration or inflamed duodenal diverticulum in the periampullary region. The likelihood of adenocarcinoma seems remote given the patient's relatively young age.   Electronically Signed   By: Sherryl Barters M.D.   On: 10/24/2013 15:43   Dg Chest Portable 1 View  10/24/2013   CLINICAL DATA:  Chest pain since yesterday.  EXAM: PORTABLE CHEST - 1 VIEW  COMPARISON:  None.  FINDINGS: Heart size and mediastinal contours are within normal limits. Both lungs are clear. Visualized skeletal structures are unremarkable.  IMPRESSION: Negative exam.   Electronically Signed   By: Inge Rise M.D.   On: 10/24/2013 13:02    EKG Interpretation   None       MDM   1. Pancreatitis     I personally performed the  services described in this documentation, which was scribed in my presence. The recorded information has been reviewed and is accurate. Patient presents for second time in 2 days of abdominal pain.  Notably, the patient had a CT scan performed yesterday, but demonstrated pancreatitis.  Patient was intolerant of oral analgesia at home is worsening pain, increased lipase, he was admitted for further evaluation and management.    Carmin Muskrat, MD 10/25/13 1115

## 2013-10-25 NOTE — ED Notes (Signed)
Pt not able to void at this time. Hospitalist at bedside.

## 2013-10-26 ENCOUNTER — Inpatient Hospital Stay (HOSPITAL_COMMUNITY): Payer: BC Managed Care – PPO

## 2013-10-26 DIAGNOSIS — E1165 Type 2 diabetes mellitus with hyperglycemia: Secondary | ICD-10-CM

## 2013-10-26 DIAGNOSIS — D72829 Elevated white blood cell count, unspecified: Secondary | ICD-10-CM

## 2013-10-26 DIAGNOSIS — IMO0001 Reserved for inherently not codable concepts without codable children: Secondary | ICD-10-CM

## 2013-10-26 DIAGNOSIS — I10 Essential (primary) hypertension: Secondary | ICD-10-CM

## 2013-10-26 LAB — CBC
HCT: 41.4 % (ref 39.0–52.0)
HEMOGLOBIN: 14.2 g/dL (ref 13.0–17.0)
MCH: 31.5 pg (ref 26.0–34.0)
MCHC: 34.3 g/dL (ref 30.0–36.0)
MCV: 91.8 fL (ref 78.0–100.0)
Platelets: 196 10*3/uL (ref 150–400)
RBC: 4.51 MIL/uL (ref 4.22–5.81)
RDW: 12.3 % (ref 11.5–15.5)
WBC: 11.3 10*3/uL — ABNORMAL HIGH (ref 4.0–10.5)

## 2013-10-26 LAB — LIPID PANEL
Cholesterol: 199 mg/dL (ref 0–200)
HDL: 45 mg/dL (ref >39)
LDL Cholesterol: 110 mg/dL — ABNORMAL HIGH (ref 0–99)
TRIGLYCERIDES: 219 mg/dL — AB (ref <150)
Total CHOL/HDL Ratio: 4.4 RATIO
VLDL: 44 mg/dL — ABNORMAL HIGH (ref 0–40)

## 2013-10-26 LAB — GLUCOSE, CAPILLARY
GLUCOSE-CAPILLARY: 154 mg/dL — AB (ref 70–99)
GLUCOSE-CAPILLARY: 141 mg/dL — AB (ref 70–99)
GLUCOSE-CAPILLARY: 156 mg/dL — AB (ref 70–99)
Glucose-Capillary: 131 mg/dL — ABNORMAL HIGH (ref 70–99)

## 2013-10-26 LAB — BASIC METABOLIC PANEL
BUN: 8 mg/dL (ref 6–23)
CO2: 29 mEq/L (ref 19–32)
Calcium: 9 mg/dL (ref 8.4–10.5)
Chloride: 98 mEq/L (ref 96–112)
Creatinine, Ser: 1.1 mg/dL (ref 0.50–1.35)
GFR calc Af Amer: >90 mL/min (ref >90)
GFR, EST NON AFRICAN AMERICAN: 83 mL/min — AB (ref >90)
Glucose, Bld: 166 mg/dL — ABNORMAL HIGH (ref 70–99)
POTASSIUM: 4 meq/L (ref 3.7–5.3)
SODIUM: 138 meq/L (ref 137–147)

## 2013-10-26 LAB — LIPASE, BLOOD: Lipase: 61 U/L — ABNORMAL HIGH (ref 11–59)

## 2013-10-26 LAB — SEDIMENTATION RATE: Sed Rate: 50 mm/hr — ABNORMAL HIGH (ref 0–16)

## 2013-10-26 MED ORDER — SODIUM CHLORIDE 0.9 % IV SOLN
INTRAVENOUS | Status: DC
  Administered 2013-10-26 – 2013-10-29 (×5): via INTRAVENOUS

## 2013-10-26 MED ORDER — GADOBENATE DIMEGLUMINE 529 MG/ML IV SOLN
20.0000 mL | Freq: Once | INTRAVENOUS | Status: AC | PRN
  Administered 2013-10-26: 20 mL via INTRAVENOUS

## 2013-10-26 NOTE — Progress Notes (Signed)
Utilization Review Complete  

## 2013-10-26 NOTE — Plan of Care (Signed)
Problem: Phase II Progression Outcomes Goal: Tolerates PO clear liquids Outcome: Not Progressing [id number] Patient tolerated sips of water this afternoon, clear liquids ordered. Pt preferred to have sips of water only due to abdominal discomfort. Donavan Foil ,RN

## 2013-10-26 NOTE — Telephone Encounter (Signed)
Tried to call with no answer  

## 2013-10-26 NOTE — Progress Notes (Signed)
[facility] PROGRESS NOTE  Todd Rodriguez JYN:[id number] DOB: Mar 28, 1976 DOA: 10/25/2013 PCP: Lottie Dawson, MD  Assessment/Plan: 1. Acute on chronic pancreatitis. Patient presenting with complaints of abdominal pain. He states drinking occasionally, denies alcohol abuse. Will check a fasting lipid panel and sedimentation rate. CT scan of abdomen and pelvis done on admission reported by radiology to reveal findings characteristic for focal chronic peritoneal pancreatitis. However also mentioned radiology report was differential diagnostic considerations include adenocarcinoma of the head of the pancreas. I have ordered an MRCP to help distinguish between chronic pancreatitis and malignancy. Otherwise plan to advance his diet to clears today, continue IV fluids, as needed narcotic analgesics, supportive care.  2. Type 2 diabetes mellitus. Patient having hemoglobin A1c of 7.1 on 10/25/2013. Blood sugars remaining in the 150s to 190s. Metformin was held on admission. Will provide Accu-Cheks with sliding scale coverage. 3. Hypertension. He remains on lisinopril 20 mg by mouth daily. Blood pressure this morning of 145/84. Will continue monitoring vital signs, consider increasing ACE inhibitor dose of blood pressures remain elevated.  4. Gastroesophageal reflux disease. Continue Protonix 40 mg daily 5. DVT prophylaxis. Lovenox  Code Status: Full code Disposition Plan: Advancing diet to clears, MRCP ordered   HPI/Subjective: Patient is a pleasant 38 year old gentleman with a past medical history of chronic pancreatitis, who was admitted to the medicine service on 10/25/2013, presenting with epigastric abdominal pain. Initial lab work revealed a lipase of 158 as a CT scan of abdomen and pelvis revealed findings characteristic for focal chronic paraduodenal pancreatitis. However radiology also mention differential diagnostic considerations include adenocarcinoma in the head of the pancreas.  Patient was made n.p.o. for bowel rest, started on IV fluids and when necessary narcotic analgesics. This morning he complains of some abdominal pain in the epigastric area however improved since yesterday. He continues to require as needed narcotic analgesics.  Objective: Filed Vitals:   10/26/13 0648  BP: 145/84  Pulse: 85  Temp: 98.7 F (37.1 C)  Resp: 19    Intake/Output Summary (Last 24 hours) at 10/26/13 0953 Last data filed at 10/25/13 1900  Gross per 24 hour  Intake    525 ml  Output      0 ml  Net    525 ml   Filed Weights   10/25/13 0927 10/25/13 1238  Weight: 103.42 kg (228 lb) 100 kg (220 lb 7.4 oz)    Exam:   General:  Patient is in no acute stress, awake alert oriented  Cardiovascular: Rregular rate rhythm normal S1-S2  Respiratory: Lungs are clear to auscultation bilaterally, normal respiratory effort  Abdomen: Patient having mild tenderness to palpation over epigastric region, no peritoneal signs, positive bowel sounds  Musculoskeletal: No edema   Data Reviewed: Basic Metabolic Panel:  Recent Labs Lab 10/24/13 1125 10/25/13 1012 10/26/13 0527  NA 135* 134* 138  K 3.8 4.1 4.0  CL 95* 92* 98  CO2 26 28 29   GLUCOSE 174* 190* 166*  BUN 12 8 8   CREATININE 1.23 1.06 1.10  CALCIUM 9.3 9.3 9.0   Liver Function Tests:  Recent Labs Lab 10/24/13 1125 10/25/13 1012  AST 16 15  ALT 25 21  ALKPHOS 71 65  BILITOT 0.8 0.7  PROT 7.5 7.9  ALBUMIN 4.1 4.0    Recent Labs Lab 10/24/13 1125 10/25/13 1012 10/26/13 0523  LIPASE 67* 158* 61*   No results found for this basename: AMMONIA,  in the last 168 hours CBC:  Recent Labs Lab 10/24/13 1125 10/25/13 1012  10/26/13 0527  WBC 8.7 12.5* 11.3*  NEUTROABS 6.9 11.1*  --   HGB 15.6 15.9 14.2  HCT 44.4 44.7 41.4  MCV 90.1 90.1 91.8  PLT 229 209 196   Cardiac Enzymes:  Recent Labs Lab 10/24/13 1125 10/25/13 1012  TROPONINI <0.30 <0.30   BNP (last 3 results) No results found for this  basename: PROBNP,  in the last 8760 hours CBG:  Recent Labs Lab 10/24/13 1056 10/25/13 1159 10/25/13 1637 10/26/13 0740  GLUCAP 191* 174* 155* 154*    No results found for this or any previous visit (from the past 240 hour(s)).   Studies: US Abdomen Complete  10/24/2013   CLINICAL DATA:  Abdominal pain.  EXAM: ULTRASOUND ABDOMEN COMPLETE  COMPARISON:  CT, 02/19/2013  FINDINGS: Gallbladder:  No gallstones or wall thickening visualized. No sonographic Murphy sign noted.  Common bile duct:  Diameter: 4.1 mm  Liver:  Liver is mildly enlarged there is increased liver echogenicity consistent with fatty infiltration. Focal fatty sparing is noted along the gallbladder fossa. No liver masses. Hepatopetal flow was documented in the portal vein.  IVC:  No abnormality visualized.  Pancreas:  Visualized portion unremarkable.  Spleen:  Size and appearance within normal limits.  Right Kidney:  Length: 11.9 cm. Echogenicity within normal limits. No mass or hydronephrosis visualized.  Left Kidney:  Length: 11.0 cm. Echogenicity within normal limits. No mass or hydronephrosis visualized.  Abdominal aorta:  No aneurysm visualized.  Other findings:  None.  IMPRESSION: 1. Hepatic steatosis with focal fatty sparing along the gallbladder fossa. 2. No other abnormalities.  No acute findings.   Electronically Signed   By: Lajean Manes M.D.   On: 10/24/2013 13:23   Ct Abdomen Pelvis W Contrast  10/24/2013   CLINICAL DATA:  Intermittent epigastric pain with nausea and vomiting. Diabetes. Hypertension.  EXAM: CT ABDOMEN AND PELVIS WITH CONTRAST  TECHNIQUE: Multidetector CT imaging of the abdomen and pelvis was performed using the standard protocol following bolus administration of intravenous contrast.  CONTRAST:  72m OMNIPAQUE IOHEXOL 300 MG/ML SOLN, 1035mOMNIPAQUE IOHEXOL 300 MG/ML SOLN  COMPARISON:  [place] COM[place] dated 10/24/2013; CT ABD/PELV WO CM dated 02/19/2013  FINDINGS: Mild airway thickening observed in both  lower lobes. There is abnormal hypodensity within or along the head of the pancreas and uncinate process with abnormal edema and stranding in this vicinity is and tracking around the descending duodenum and proximal transverse duodenum. The remainder the pancreas appears normal, without generalized peripancreatic stranding. The extrahepatic bile ducts are obscured by the surrounding edema/stranding. There is no intrahepatic biliary dilatation, and the gallbladder does not appear to be the epicenter of the inflammatory process.  Adrenal glands, spleen, and liver unremarkable. Kidneys and proximal ureters unremarkable. No pathologic upper abdominal adenopathy is observed. Mild aortoiliac atherosclerosis noted.  Appendix normal. No dilated bowel. No free pelvic fluid. Urinary bladder normal.  IMPRESSION: 1. Abnormal continued appearance of stranding/edema in the pancreaticoduodenal groove with surrounding stranding mildly increased compared to the prior exam. 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 Differential diagnostic considerations include adenocarcinoma in the head of the pancreas and duodenal ulceration or inflamed duodenal diverticulum in the periampullary region. The likelihood of adenocarcinoma seems remote given the patient's relatively young age.   Electronically Signed   By: WaSherryl Barters.D.   On: 10/24/2013 15:43   Dg Chest Portable 1 View  10/24/2013   CLINICAL DATA:  Chest pain since yesterday.  EXAM: PORTABLE CHEST - 1 VIEW  COMPARISON:  None.  FINDINGS: Heart size and mediastinal contours are within normal limits. Both lungs are clear. Visualized skeletal structures are unremarkable.  IMPRESSION: Negative exam.   Electronically Signed   By: Inge Rise M.D.   On: 10/24/2013 13:02    Scheduled Meds: . enoxaparin (LOVENOX) injection  40 mg Subcutaneous Q24H  . insulin aspart  0-9 Units  Subcutaneous TID WC  . lisinopril  20 mg Oral Daily  . pantoprazole (PROTONIX) IV  40 mg Intravenous Q12H   Continuous Infusions: . dextrose 5 % and 0.9% NaCl 100 mL/hr at 10/26/13 2820    Active Problems:   Pancreatitis   Acute pancreatitis   Type II or unspecified type diabetes mellitus without mention of complication, uncontrolled   Essential hypertension, benign   Leukocytosis, unspecified    Time spent: 35 minutes   Kelvin Cellar  [facility] Pager [phone]. If 7PM-7AM, please contact night-coverage at [email or web address], password [facility] 10/26/2013, 9:53 AM  LOS: 1 day

## 2013-10-27 LAB — BASIC METABOLIC PANEL
BUN: 7 mg/dL (ref 6–23)
CHLORIDE: 98 meq/L (ref 96–112)
CO2: 27 meq/L (ref 19–32)
CREATININE: 1.1 mg/dL (ref 0.50–1.35)
Calcium: 8.8 mg/dL (ref 8.4–10.5)
GFR calc Af Amer: >90 mL/min (ref >90)
GFR calc non Af Amer: 83 mL/min — ABNORMAL LOW (ref >90)
GLUCOSE: 127 mg/dL — AB (ref 70–99)
Potassium: 3.8 mEq/L (ref 3.7–5.3)
Sodium: 136 mEq/L — ABNORMAL LOW (ref 137–147)

## 2013-10-27 LAB — CBC
HEMATOCRIT: 38.3 % — AB (ref 39.0–52.0)
HEMOGLOBIN: 13.1 g/dL (ref 13.0–17.0)
MCH: 31.6 pg (ref 26.0–34.0)
MCHC: 34.2 g/dL (ref 30.0–36.0)
MCV: 92.3 fL (ref 78.0–100.0)
Platelets: 207 10*3/uL (ref 150–400)
RBC: 4.15 MIL/uL — AB (ref 4.22–5.81)
RDW: 12.2 % (ref 11.5–15.5)
WBC: 8.6 10*3/uL (ref 4.0–10.5)

## 2013-10-27 LAB — GLUCOSE, CAPILLARY
GLUCOSE-CAPILLARY: 146 mg/dL — AB (ref 70–99)
Glucose-Capillary: 96 mg/dL (ref 70–99)
Glucose-Capillary: 124 mg/dL — ABNORMAL HIGH (ref 70–99)
Glucose-Capillary: 110 mg/dL — ABNORMAL HIGH (ref 70–99)

## 2013-10-27 LAB — LIPASE, BLOOD: LIPASE: 42 U/L (ref 11–59)

## 2013-10-27 MED ORDER — PANTOPRAZOLE SODIUM 40 MG PO TBEC
40.0000 mg | DELAYED_RELEASE_TABLET | Freq: Two times a day (BID) | ORAL | Status: DC
  Administered 2013-10-27 – 2013-10-29 (×4): 40 mg via ORAL
  Filled 2013-10-27 (×4): qty 1

## 2013-10-27 NOTE — Care Management Note (Signed)
    Page 1 of 1   10/27/2013     1:56:40 PM   CARE MANAGEMENT NOTE 10/27/2013  Patient:  [facility]   Account Number:  [id number]  Date Initiated:  10/27/2013  Documentation initiated by:  Claretha Cooper  Subjective/Objective Assessment:   Pt from home. Lives with spouse and plans on returning home. Needs a new CBG meter.     Action/Plan:   Anticipated DC Date:     Anticipated DC Plan:  HOME/SELF CARE      DC Planning Services  CM consult      Choice offered to / List presented to:             Status of service:  Completed, signed off Medicare Important Message given?   (If response is "NO", the following Medicare IM given date fields will be blank) Date Medicare IM given:   Date Additional Medicare IM given:    Discharge Disposition:    Per UR Regulation:    If discussed at Long Length of Stay Meetings, dates discussed:    Comments:  10/27/13 Claretha Cooper RN BSN CM Order for meter in shadow chart.

## 2013-10-27 NOTE — Progress Notes (Signed)
[facility] PROGRESS NOTE  Todd Rodriguez MQK:[id number] DOB: Dec 01, 1975 DOA: 10/25/2013 PCP: Lottie Dawson, MD  Assessment/Plan: 1. Acute on chronic pancreatitis. Patient presenting with complaints of abdominal pain. He states drinking occasionally, denies alcohol abuse. Will check a fasting lipid panel and sedimentation rate. CT scan of abdomen and pelvis done on admission reported by radiology to reveal findings characteristic for focal chronic peritoneal pancreatitis. However also mentioned radiology report was differential diagnostic considerations include adenocarcinoma of the head of the pancreas. We have ordered an MRI  to help distinguish between chronic pancreatitis and malignancy. MRI abdomen showed acute pancreatitis, recommend repeating it after the acute episode is over.  Otherwise plan to advance his diet to full liquids today, continue IV fluids, as needed narcotic analgesics, supportive care.  2. Type 2 diabetes mellitus. Patient having hemoglobin A1c of 7.1 on 10/25/2013. Blood sugars remaining in the 150s to 190s. Metformin was held on admission. Will provide Accu-Cheks with sliding scale coverage. 3. Hypertension. He remains on lisinopril 20 mg by mouth daily. Blood pressure this morning of 145/84. Will continue monitoring vital signs, consider increasing ACE inhibitor dose of blood pressures remain elevated.  4. Gastroesophageal reflux disease. Continue Protonix 40 mg daily 5. DVT prophylaxis. Lovenox  Code Status: Full code Disposition Plan: Advancing diet to full liquid diet.    HPI/Subjective: Patient is a pleasant 38 year old gentleman with a past medical history of chronic pancreatitis, who was admitted to the medicine service on 10/25/2013, presenting with epigastric abdominal pain. Initial lab work revealed a lipase of 158 as a CT scan of abdomen and pelvis revealed findings characteristic for focal chronic paraduodenal pancreatitis. However radiology also  mention differential diagnostic considerations include adenocarcinoma in the head of the pancreas. Patient was made n.p.o. for bowel rest, started on IV fluids and when necessary narcotic analgesics. This morning he complains of some abdominal pain in the epigastric area however improved since yesterday. He continues to require as needed narcotic analgesics.  Objective: Filed Vitals:   10/27/13 0750  BP: 145/83  Pulse:   Temp:   Resp:     Intake/Output Summary (Last 24 hours) at 10/27/13 1148 Last data filed at 10/27/13 0900  Gross per 24 hour  Intake   2605 ml  Output      0 ml  Net   2605 ml   Filed Weights   10/25/13 0927 10/25/13 1238  Weight: 103.42 kg (228 lb) 100 kg (220 lb 7.4 oz)    Exam:   General:  Patient is in no acute stress, awake alert oriented  Cardiovascular: Rregular rate rhythm normal S1-S2  Respiratory: Lungs are clear to auscultation bilaterally, normal respiratory effort  Abdomen: Patient having mild tenderness to palpation over epigastric region, no peritoneal signs, positive bowel sounds  Musculoskeletal: No edema   Data Reviewed: Basic Metabolic Panel:  Recent Labs Lab 10/24/13 1125 10/25/13 1012 10/26/13 0527 10/27/13 0521  NA 135* 134* 138 136*  K 3.8 4.1 4.0 3.8  CL 95* 92* 98 98  CO2 26 28 29 27   GLUCOSE 174* 190* 166* 127*  BUN 12 8 8 7   CREATININE 1.23 1.06 1.10 1.10  CALCIUM 9.3 9.3 9.0 8.8   Liver Function Tests:  Recent Labs Lab 10/24/13 1125 10/25/13 1012  AST 16 15  ALT 25 21  ALKPHOS 71 65  BILITOT 0.8 0.7  PROT 7.5 7.9  ALBUMIN 4.1 4.0    Recent Labs Lab 10/24/13 1125 10/25/13 1012 10/26/13 0523 10/27/13 0745  LIPASE 67* 158*  61* 42   No results found for this basename: AMMONIA,  in the last 168 hours CBC:  Recent Labs Lab 10/24/13 1125 10/25/13 1012 10/26/13 0527 2013-11-01 0521  WBC 8.7 12.5* 11.3* 8.6  NEUTROABS 6.9 11.1*  --   --   HGB 15.6 15.9 14.2 13.1  HCT 44.4 44.7 41.4 38.3*  MCV  90.1 90.1 91.8 92.3  PLT 229 209 196 207   Cardiac Enzymes:  Recent Labs Lab 10/24/13 1125 10/25/13 1012  TROPONINI <0.30 <0.30   BNP (last 3 results) No results found for this basename: PROBNP,  in the last 8760 hours CBG:  Recent Labs Lab 10/26/13 1106 10/26/13 1751 10/26/13 2112 11/01/2013 0816 11/01/2013 1141  GLUCAP 131* 141* 156* 146* 96    No results found for this or any previous visit (from the past 240 hour(s)).   Studies: Mr 3d Recon At Scanner  November 01, 2013   CLINICAL DATA:  Abdominal pain with abnormal pancreatic head and edema/ inflammation in the retroperitoneal tissues around the duodenum and pancreas.  EXAM: MRI ABDOMEN WITHOUT AND WITH CONTRAST (INCLUDING MRCP)  TECHNIQUE: Multiplanar multisequence MR imaging of the abdomen was performed both before and after the administration of intravenous contrast. Heavily T2-weighted images of the biliary and pancreatic ducts were obtained, and three-dimensional MRCP images were rendered by post processing.  CONTRAST:  7m MULTIHANCE GADOBENATE DIMEGLUMINE 529 MG/ML IV SOLN  COMPARISON:  CT scan from 10/24/2013.  FINDINGS: 9 mm cyst is identified in the posterior left liver (see image 36 series 11). Otherwise liver parenchyma is unremarkable.  Trace prominence of the intrahepatic biliary ducts is evident. Extrahepatic common duct diameter is 4 mm, normal. No filling defect within the bile ducts to suggest choledocholithiasis. No evidence for gallstones.  As seen on the recent CT scan, there is edema/inflammation involving the second segment of the duodenum and pancreatic head/ uncinate process. This is associated with circumferential duodenal wall thickening and obscuration of intervening fat planes between the pancreatic parenchyma and the duodenum. Two small areas of hypo enhancement are identified in the pancreatic head, each measuring 1.5- 2.0 cm in maximum diameter. These show intermediate signal intensity on T2 weighted imaging  and are intermediate to high signal intensity on T1 weighted imaging. These areas do not show substantial enhancement after IV contrast administration.  The portal vein, superior mesenteric vein, and splenic vein are patent. Celiac axis and superior mesenteric artery abnormal MR imaging features.  No focal abnormality is seen in the spleen. The stomach, adrenal glands, and kidneys are normal in appearance. There is no abdominal aortic aneurysm.  No abnormal marrow signal within the visualized bony structures.  IMPRESSION: Edema/ inflammation in the region of the pancreatic head and descending duodenum, as noted on recent CT scan. Two abnormal areas within the pancreatic head parenchyma probably represent tiny fluid collections with proteinaceous debris or hemorrhage within. MR imaging features remain compatible with pancreatic head inflammation and secondary irritation of the duodenum. Alternatively, duodenitis/ peptic ulcer disease with secondary inflammation of the pancreas could have this appearance. While the study was performed to assess for pancreatic adenocarcinoma, given the degree of edema/inflammation within the pancreatic parenchyma and the heterogeneous pancreatic enhancement presumably resulting from these changes, it is not possible to exclude neoplastic involvement of the pancreas by imaging today. Consider a repeat MRI without and with contrast after the acute signs/symptoms of disease have resolved.   Electronically Signed   By: EMisty StanleyM.D.   On: 0Jan 28, 201508:31   Mr  Abd W/wo Cm/mrcp  10/27/2013   CLINICAL DATA:  Abdominal pain with abnormal pancreatic head and edema/ inflammation in the retroperitoneal tissues around the duodenum and pancreas.  EXAM: MRI ABDOMEN WITHOUT AND WITH CONTRAST (INCLUDING MRCP)  TECHNIQUE: Multiplanar multisequence MR imaging of the abdomen was performed both before and after the administration of intravenous contrast. Heavily T2-weighted images of the biliary  and pancreatic ducts were obtained, and three-dimensional MRCP images were rendered by post processing.  CONTRAST:  64m MULTIHANCE GADOBENATE DIMEGLUMINE 529 MG/ML IV SOLN  COMPARISON:  CT scan from 10/24/2013.  FINDINGS: 9 mm cyst is identified in the posterior left liver (see image 36 series 11). Otherwise liver parenchyma is unremarkable.  Trace prominence of the intrahepatic biliary ducts is evident. Extrahepatic common duct diameter is 4 mm, normal. No filling defect within the bile ducts to suggest choledocholithiasis. No evidence for gallstones.  As seen on the recent CT scan, there is edema/inflammation involving the second segment of the duodenum and pancreatic head/ uncinate process. This is associated with circumferential duodenal wall thickening and obscuration of intervening fat planes between the pancreatic parenchyma and the duodenum. Two small areas of hypo enhancement are identified in the pancreatic head, each measuring 1.5- 2.0 cm in maximum diameter. These show intermediate signal intensity on T2 weighted imaging and are intermediate to high signal intensity on T1 weighted imaging. These areas do not show substantial enhancement after IV contrast administration.  The portal vein, superior mesenteric vein, and splenic vein are patent. Celiac axis and superior mesenteric artery abnormal MR imaging features.  No focal abnormality is seen in the spleen. The stomach, adrenal glands, and kidneys are normal in appearance. There is no abdominal aortic aneurysm.  No abnormal marrow signal within the visualized bony structures.  IMPRESSION: Edema/ inflammation in the region of the pancreatic head and descending duodenum, as noted on recent CT scan. Two abnormal areas within the pancreatic head parenchyma probably represent tiny fluid collections with proteinaceous debris or hemorrhage within. MR imaging features remain compatible with pancreatic head inflammation and secondary irritation of the duodenum.  Alternatively, duodenitis/ peptic ulcer disease with secondary inflammation of the pancreas could have this appearance. While the study was performed to assess for pancreatic adenocarcinoma, given the degree of edema/inflammation within the pancreatic parenchyma and the heterogeneous pancreatic enhancement presumably resulting from these changes, it is not possible to exclude neoplastic involvement of the pancreas by imaging today. Consider a repeat MRI without and with contrast after the acute signs/symptoms of disease have resolved.   Electronically Signed   By: EMisty StanleyM.D.   On: 10/27/2013 08:31    Scheduled Meds: . enoxaparin (LOVENOX) injection  40 mg Subcutaneous Q24H  . insulin aspart  0-9 Units Subcutaneous TID WC  . lisinopril  20 mg Oral Daily  . pantoprazole  40 mg Oral BID   Continuous Infusions: . sodium chloride 100 mL/hr at 10/26/13 1754    Active Problems:   Pancreatitis   Acute pancreatitis   Type II or unspecified type diabetes mellitus without mention of complication, uncontrolled   Essential hypertension, benign   Leukocytosis, unspecified    Time spent: 35 minutes   Sheriden Archibeque  [facility] Pager [phone] If 7PM-7AM, please contact night-coverage at [email or web address], password [email or web address]1/23/2015, 11:48 AM  LOS: 2 days

## 2013-10-27 NOTE — Progress Notes (Signed)
[place]'s information on pancreatitis and smoking cessation given.

## 2013-10-28 LAB — GLUCOSE, CAPILLARY
GLUCOSE-CAPILLARY: 104 mg/dL — AB (ref 70–99)
GLUCOSE-CAPILLARY: 111 mg/dL — AB (ref 70–99)
GLUCOSE-CAPILLARY: 148 mg/dL — AB (ref 70–99)
Glucose-Capillary: 138 mg/dL — ABNORMAL HIGH (ref 70–99)

## 2013-10-28 NOTE — Progress Notes (Signed)
[facility] PROGRESS NOTE  Todd Rodriguez EOF:[id number] DOB: [birth year] DOA: 10/25/2013 PCP: Lottie Dawson, MD  Assessment/Plan:   Acute on chronic pancreatitis. Patient presenting with complaints of abdominal pain. He states drinking occasionally, denies alcohol abuse. Will check a fasting lipid panel and sedimentation rate. CT scan of abdomen and pelvis done on admission reported by radiology to reveal findings characteristic for focal chronic peritoneal pancreatitis. However also mentioned radiology report was differential diagnostic considerations include adenocarcinoma of the head of the pancreas. We have ordered an MRI  to help distinguish between chronic pancreatitis and malignancy. MRI abdomen showed acute pancreatitis, recommend repeating it after the acute episode is over.   His diet was advanced to solids last night, but this morning, he reports persistent epigastric pain and his diet was changed to full liquids. We will monitor him on full liquds, IV fluids and pain control.   Type 2 diabetes mellitus: Patient having hemoglobin A1c of 7.1 on 10/25/2013. Blood sugars remaining in the 150s to 190s. Metformin was held on admission. Will provide Accu-Cheks with sliding scale coverage.  CBG (last 3)   Recent Labs  10/27/13 1725 10/27/13 2038 10/28/13 0744  GLUCAP 124* 110* 104*       Hypertension. He remains on lisinopril 20 mg by mouth daily. Blood pressure this morning of 145/84. Will continue monitoring vital signs, consider increasing ACE inhibitor dose of blood pressures remain elevated. His bp probably elevated from epigastric pain.    Gastroesophageal reflux disease. Continue Protonix 40 mg daily  DVT prophylaxis. Lovenox  Code Status: Full code Disposition Plan: Advancing diet to full liquid diet.    HPI/Subjective: Patient is a pleasant 38 year old gentleman with a past medical history of chronic pancreatitis, who was admitted to the medicine service on  10/25/2013, presenting with epigastric abdominal pain. Initial lab work revealed a lipase of 158 as a CT scan of abdomen and pelvis revealed findings characteristic for focal chronic paraduodenal pancreatitis. Pt reports persistent abdominal pain after eating solids, he is back on full liquid diet.    Objective: Filed Vitals:   10/28/13 0406  BP: 165/89  Pulse: 77  Temp: 98.1 F (36.7 C)  Resp: 18    Intake/Output Summary (Last 24 hours) at 10/28/13 1100 Last data filed at 10/28/13 0824  Gross per 24 hour  Intake    600 ml  Output      0 ml  Net    600 ml   Filed Weights   10/25/13 0927 10/25/13 1238  Weight: 103.42 kg (228 lb) 100 kg (220 lb 7.4 oz)    Exam:   General:  Patient is in no acute stress, awake alert oriented  Cardiovascular: Rregular rate rhythm normal S1-S2  Respiratory: Lungs are clear to auscultation bilaterally, normal respiratory effort  Abdomen: Patient having mild tenderness to palpation over epigastric region, no peritoneal signs, positive bowel sounds  Musculoskeletal: No edema   Data Reviewed: Basic Metabolic Panel:  Recent Labs Lab 10/24/13 1125 10/25/13 1012 10/26/13 0527 10/27/13 0521  NA 135* 134* 138 136*  K 3.8 4.1 4.0 3.8  CL 95* 92* 98 98  CO2 26 28 29 27   GLUCOSE 174* 190* 166* 127*  BUN 12 8 8 7   CREATININE 1.23 1.06 1.10 1.10  CALCIUM 9.3 9.3 9.0 8.8   Liver Function Tests:  Recent Labs Lab 10/24/13 1125 10/25/13 1012  AST 16 15  ALT 25 21  ALKPHOS 71 65  BILITOT 0.8 0.7  PROT 7.5 7.9  ALBUMIN 4.1  4.0    Recent Labs Lab 10/24/13 1125 10/25/13 1012 10/26/13 0523 11/04/2013 0745  LIPASE 67* 158* 61* 42   No results found for this basename: AMMONIA,  in the last 168 hours CBC:  Recent Labs Lab 10/24/13 1125 10/25/13 1012 10/26/13 0527 11-04-13 0521  WBC 8.7 12.5* 11.3* 8.6  NEUTROABS 6.9 11.1*  --   --   HGB 15.6 15.9 14.2 13.1  HCT 44.4 44.7 41.4 38.3*  MCV 90.1 90.1 91.8 92.3  PLT 229 209 196  207   Cardiac Enzymes:  Recent Labs Lab 10/24/13 1125 10/25/13 1012  TROPONINI <0.30 <0.30   BNP (last 3 results) No results found for this basename: PROBNP,  in the last 8760 hours CBG:  Recent Labs Lab November 04, 2013 0816 2013/11/04 1141 04-Nov-2013 1725 11-04-13 2038 10/28/13 0744  GLUCAP 146* 96 124* 110* 104*    No results found for this or any previous visit (from the past 240 hour(s)).   Studies: Mr 3d Recon At Scanner  Nov 04, 2013   CLINICAL DATA:  Abdominal pain with abnormal pancreatic head and edema/ inflammation in the retroperitoneal tissues around the duodenum and pancreas.  EXAM: MRI ABDOMEN WITHOUT AND WITH CONTRAST (INCLUDING MRCP)  TECHNIQUE: Multiplanar multisequence MR imaging of the abdomen was performed both before and after the administration of intravenous contrast. Heavily T2-weighted images of the biliary and pancreatic ducts were obtained, and three-dimensional MRCP images were rendered by post processing.  CONTRAST:  64m MULTIHANCE GADOBENATE DIMEGLUMINE 529 MG/ML IV SOLN  COMPARISON:  CT scan from 10/24/2013.  FINDINGS: 9 mm cyst is identified in the posterior left liver (see image 36 series 11). Otherwise liver parenchyma is unremarkable.  Trace prominence of the intrahepatic biliary ducts is evident. Extrahepatic common duct diameter is 4 mm, normal. No filling defect within the bile ducts to suggest choledocholithiasis. No evidence for gallstones.  As seen on the recent CT scan, there is edema/inflammation involving the second segment of the duodenum and pancreatic head/ uncinate process. This is associated with circumferential duodenal wall thickening and obscuration of intervening fat planes between the pancreatic parenchyma and the duodenum. Two small areas of hypo enhancement are identified in the pancreatic head, each measuring 1.5- 2.0 cm in maximum diameter. These show intermediate signal intensity on T2 weighted imaging and are intermediate to high signal  intensity on T1 weighted imaging. These areas do not show substantial enhancement after IV contrast administration.  The portal vein, superior mesenteric vein, and splenic vein are patent. Celiac axis and superior mesenteric artery abnormal MR imaging features.  No focal abnormality is seen in the spleen. The stomach, adrenal glands, and kidneys are normal in appearance. There is no abdominal aortic aneurysm.  No abnormal marrow signal within the visualized bony structures.  IMPRESSION: Edema/ inflammation in the region of the pancreatic head and descending duodenum, as noted on recent CT scan. Two abnormal areas within the pancreatic head parenchyma probably represent tiny fluid collections with proteinaceous debris or hemorrhage within. MR imaging features remain compatible with pancreatic head inflammation and secondary irritation of the duodenum. Alternatively, duodenitis/ peptic ulcer disease with secondary inflammation of the pancreas could have this appearance. While the study was performed to assess for pancreatic adenocarcinoma, given the degree of edema/inflammation within the pancreatic parenchyma and the heterogeneous pancreatic enhancement presumably resulting from these changes, it is not possible to exclude neoplastic involvement of the pancreas by imaging today. Consider a repeat MRI without and with contrast after the acute signs/symptoms of disease have resolved.  Electronically Signed   By: Misty Stanley M.D.   On: 10/27/2013 08:31   Mr Jeananne Rama W/wo Cm/mrcp  10/27/2013   CLINICAL DATA:  Abdominal pain with abnormal pancreatic head and edema/ inflammation in the retroperitoneal tissues around the duodenum and pancreas.  EXAM: MRI ABDOMEN WITHOUT AND WITH CONTRAST (INCLUDING MRCP)  TECHNIQUE: Multiplanar multisequence MR imaging of the abdomen was performed both before and after the administration of intravenous contrast. Heavily T2-weighted images of the biliary and pancreatic ducts were obtained,  and three-dimensional MRCP images were rendered by post processing.  CONTRAST:  81m MULTIHANCE GADOBENATE DIMEGLUMINE 529 MG/ML IV SOLN  COMPARISON:  CT scan from 10/24/2013.  FINDINGS: 9 mm cyst is identified in the posterior left liver (see image 36 series 11). Otherwise liver parenchyma is unremarkable.  Trace prominence of the intrahepatic biliary ducts is evident. Extrahepatic common duct diameter is 4 mm, normal. No filling defect within the bile ducts to suggest choledocholithiasis. No evidence for gallstones.  As seen on the recent CT scan, there is edema/inflammation involving the second segment of the duodenum and pancreatic head/ uncinate process. This is associated with circumferential duodenal wall thickening and obscuration of intervening fat planes between the pancreatic parenchyma and the duodenum. Two small areas of hypo enhancement are identified in the pancreatic head, each measuring 1.5- 2.0 cm in maximum diameter. These show intermediate signal intensity on T2 weighted imaging and are intermediate to high signal intensity on T1 weighted imaging. These areas do not show substantial enhancement after IV contrast administration.  The portal vein, superior mesenteric vein, and splenic vein are patent. Celiac axis and superior mesenteric artery abnormal MR imaging features.  No focal abnormality is seen in the spleen. The stomach, adrenal glands, and kidneys are normal in appearance. There is no abdominal aortic aneurysm.  No abnormal marrow signal within the visualized bony structures.  IMPRESSION: Edema/ inflammation in the region of the pancreatic head and descending duodenum, as noted on recent CT scan. Two abnormal areas within the pancreatic head parenchyma probably represent tiny fluid collections with proteinaceous debris or hemorrhage within. MR imaging features remain compatible with pancreatic head inflammation and secondary irritation of the duodenum. Alternatively, duodenitis/ peptic  ulcer disease with secondary inflammation of the pancreas could have this appearance. While the study was performed to assess for pancreatic adenocarcinoma, given the degree of edema/inflammation within the pancreatic parenchyma and the heterogeneous pancreatic enhancement presumably resulting from these changes, it is not possible to exclude neoplastic involvement of the pancreas by imaging today. Consider a repeat MRI without and with contrast after the acute signs/symptoms of disease have resolved.   Electronically Signed   By: EMisty StanleyM.D.   On: 10/27/2013 08:31    Scheduled Meds: . enoxaparin (LOVENOX) injection  40 mg Subcutaneous Q24H  . insulin aspart  0-9 Units Subcutaneous TID WC  . lisinopril  20 mg Oral Daily  . pantoprazole  40 mg Oral BID   Continuous Infusions: . sodium chloride 100 mL/hr at 10/27/13 2338    Active Problems:   Pancreatitis   Acute pancreatitis   Type II or unspecified type diabetes mellitus without mention of complication, uncontrolled   Essential hypertension, benign   Leukocytosis, unspecified    Time spent: 35 minutes   Leylanie Woodmansee  [facility] Pager [phone] If 7PM-7AM, please contact night-coverage at [email or web address], password [facility]1/24/2015, 11:00 AM  LOS: 3 days

## 2013-10-29 LAB — GLUCOSE, CAPILLARY
GLUCOSE-CAPILLARY: 135 mg/dL — AB (ref 70–99)
GLUCOSE-CAPILLARY: 110 mg/dL — AB (ref 70–99)
Glucose-Capillary: 109 mg/dL — ABNORMAL HIGH (ref 70–99)

## 2013-10-29 MED ORDER — LISINOPRIL 20 MG PO TABS: 20.0000 mg | ORAL_TABLET | Freq: Every day | ORAL | Status: DC

## 2013-10-29 MED ORDER — PANTOPRAZOLE SODIUM 40 MG PO TBEC: 40.0000 mg | DELAYED_RELEASE_TABLET | Freq: Two times a day (BID) | ORAL | Status: DC

## 2013-10-29 NOTE — Discharge Summary (Signed)
Physician Discharge Summary  Todd Rodriguez TUU:[id number] DOB: 23-Apr-1976 DOA: 10/25/2013  PCP: Lottie Dawson, MD  Admit date: 10/25/2013 Discharge date: 10/29/2013  Time spent: 30 minutes  Recommendations for Outpatient Follow-up:  1. Follow up with PCP in one week 2. Follow up with MRI of the abdomen in 4 weeks to further evaluate pancreas  Discharge Diagnoses:  Active Problems:   Pancreatitis   Acute pancreatitis   Type II or unspecified type diabetes mellitus without mention of complication, uncontrolled   Essential hypertension, benign   Leukocytosis, unspecified   Discharge Condition: improved.   Diet recommendation: carb modified diet  Filed Weights   10/25/13 7915 10/25/13 1238 10/28/13 1740  Weight: 103.42 kg (228 lb) 100 kg (220 lb 7.4 oz) 101.424 kg (223 lb 9.6 oz)    History of present illness:  Todd Rodriguez is a 38 y.o. male with prior h/o pancreatitis, continues to take alcohol occasionally, comes in for worsening epigastric abdominal pain. On arrival to ED, labs revealed elevated lipase and CT abd evident with pancreatitis. He was referred to medical service for admission   Hospital Course:  Acute on chronic pancreatitis.  Resolved on discharge. Patient presenting with complaints of abdominal pain. He states drinking occasionally, denies alcohol abuse.CT scan of abdomen and pelvis done on admission reported by radiology to reveal findings characteristic for focal chronic peritoneal pancreatitis. However also mentioned radiology report was differential diagnostic considerations include adenocarcinoma of the head of the pancreas. We have ordered an MRI to help distinguish between chronic pancreatitis and malignancy. MRI abdomen showed acute pancreatitis, recommend repeating it after the acute episode is over.  His diet was advanced to solids  And he remained pain free.  Type 2 diabetes mellitus: Patient having hemoglobin A1c of 7.1 on 10/25/2013. Blood sugars  remaining in the 150s to 190s. CBG (last 3)   Recent Labs   10/27/13 1725  10/27/13 2038  10/28/13 0744   GLUCAP  124*  110*  104*    Hypertension. He remains on lisinopril 20 mg by mouth daily. Blood pressure this morning of 145/84.   Gastroesophageal reflux disease. Continue Protonix 40 mg daily   Procedures:  MRI abdomen  Consultations: none Discharge Exam: Filed Vitals:   10/29/13 1500  BP: 145/72  Pulse: 71  Temp: 97.9 F (36.6 C)  Resp: 18    General: Patient is in no acute stress, awake alert oriented  Cardiovascular: Rregular rate rhythm normal S1-S2  Respiratory: Lungs are clear to auscultation bilaterally, normal respiratory effort  Abdomen: no tenderness.  no peritoneal signs, positive bowel sounds  Musculoskeletal: No edema    Discharge Instructions  Discharge Orders   Future Appointments Provider Department Dept Phone   01/03/2014 2:30 PM Burnis Medin, MD [facility]   Future Orders Complete By Expires   Discharge instructions  As directed    Comments:     Follow up withPCP in 1 one week and obtain a repeat MRI of the abdomen to evaluate for resolution of the pancreatitis in 2 to 4 weeks.       Medication List    STOP taking these medications       lisinopril-hydrochlorothiazide 20-25 MG per tablet  Commonly known as:  PRINZIDE,ZESTORETIC     omeprazole 40 MG capsule  Commonly known as:  PRILOSEC      TAKE these medications       lisinopril 20 MG tablet  Commonly known as:  PRINIVIL,ZESTRIL  Take 1 tablet (20  mg total) by mouth daily.     metFORMIN 500 MG tablet  Commonly known as:  GLUCOPHAGE  Take 500 mg by mouth 2 (two) times daily with a meal.     oxyCODONE-acetaminophen 5-325 MG per tablet  Commonly known as:  PERCOCET/ROXICET  Take 1 tablet by mouth every 4 (four) hours as needed for severe pain.     pantoprazole 40 MG tablet  Commonly known as:  PROTONIX  Take 1 tablet (40 mg total) by  mouth 2 (two) times daily.       No Known Allergies     Follow-up Information   Follow up with Lottie Dawson, MD. Schedule an appointment as soon as possible for a visit in 1 week. (please obtain MRi of the abdomen in 2 to 4 weeks to evaluate for resolution of the pancreatitis. )    Specialty:  Internal Medicine   Contact information:   [place] [address] [phone]        The results of significant diagnostics from this hospitalization (including imaging, microbiology, ancillary and laboratory) are listed below for reference.    Significant Diagnostic Studies: US Abdomen Complete  10/24/2013   CLINICAL DATA:  Abdominal pain.  EXAM: ULTRASOUND ABDOMEN COMPLETE  COMPARISON:  CT, 02/19/2013  FINDINGS: Gallbladder:  No gallstones or wall thickening visualized. No sonographic Murphy sign noted.  Common bile duct:  Diameter: 4.1 mm  Liver:  Liver is mildly enlarged there is increased liver echogenicity consistent with fatty infiltration. Focal fatty sparing is noted along the gallbladder fossa. No liver masses. Hepatopetal flow was documented in the portal vein.  IVC:  No abnormality visualized.  Pancreas:  Visualized portion unremarkable.  Spleen:  Size and appearance within normal limits.  Right Kidney:  Length: 11.9 cm. Echogenicity within normal limits. No mass or hydronephrosis visualized.  Left Kidney:  Length: 11.0 cm. Echogenicity within normal limits. No mass or hydronephrosis visualized.  Abdominal aorta:  No aneurysm visualized.  Other findings:  None.  IMPRESSION: 1. Hepatic steatosis with focal fatty sparing along the gallbladder fossa. 2. No other abnormalities.  No acute findings.   Electronically Signed   By: Lajean Manes M.D.   On: 10/24/2013 13:23   Ct Abdomen Pelvis W Contrast  10/24/2013   CLINICAL DATA:  Intermittent epigastric pain with nausea and vomiting. Diabetes. Hypertension.  EXAM: CT ABDOMEN AND PELVIS WITH CONTRAST  TECHNIQUE:  Multidetector CT imaging of the abdomen and pelvis was performed using the standard protocol following bolus administration of intravenous contrast.  CONTRAST:  4m OMNIPAQUE IOHEXOL 300 MG/ML SOLN, 1033mOMNIPAQUE IOHEXOL 300 MG/ML SOLN  COMPARISON:  [place]OM[place] COMPLETE dated 10/24/2013; CT ABD/PELV WO CM dated 02/19/2013  FINDINGS: Mild airway thickening observed in both lower lobes. There is abnormal hypodensity within or along the head of the pancreas and uncinate process with abnormal edema and stranding in this vicinity is and tracking around the descending duodenum and proximal transverse duodenum. The remainder the pancreas appears normal, without generalized peripancreatic stranding. The extrahepatic bile ducts are obscured by the surrounding edema/stranding. There is no intrahepatic biliary dilatation, and the gallbladder does not appear to be the epicenter of the inflammatory process.  Adrenal glands, spleen, and liver unremarkable. Kidneys and proximal ureters unremarkable. No pathologic upper abdominal adenopathy is observed. Mild aortoiliac atherosclerosis noted.  Appendix normal. No dilated bowel. No free pelvic fluid. Urinary bladder normal.  IMPRESSION: 1. Abnormal continued appearance of stranding/edema in the pancreaticoduodenal groove with surrounding stranding mildly  increased compared to the prior exam. 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 Differential diagnostic considerations include adenocarcinoma in the head of the pancreas and duodenal ulceration or inflamed duodenal diverticulum in the periampullary region. The likelihood of adenocarcinoma seems remote given the patient's relatively young age.   Electronically Signed   By: Sherryl Barters M.D.   On: 10/24/2013 15:43   Mr 3d Recon At Scanner  10/27/2013   CLINICAL DATA:  Abdominal pain with abnormal pancreatic head and edema/  inflammation in the retroperitoneal tissues around the duodenum and pancreas.  EXAM: MRI ABDOMEN WITHOUT AND WITH CONTRAST (INCLUDING MRCP)  TECHNIQUE: Multiplanar multisequence MR imaging of the abdomen was performed both before and after the administration of intravenous contrast. Heavily T2-weighted images of the biliary and pancreatic ducts were obtained, and three-dimensional MRCP images were rendered by post processing.  CONTRAST:  72m MULTIHANCE GADOBENATE DIMEGLUMINE 529 MG/ML IV SOLN  COMPARISON:  CT scan from 10/24/2013.  FINDINGS: 9 mm cyst is identified in the posterior left liver (see image 36 series 11). Otherwise liver parenchyma is unremarkable.  Trace prominence of the intrahepatic biliary ducts is evident. Extrahepatic common duct diameter is 4 mm, normal. No filling defect within the bile ducts to suggest choledocholithiasis. No evidence for gallstones.  As seen on the recent CT scan, there is edema/inflammation involving the second segment of the duodenum and pancreatic head/ uncinate process. This is associated with circumferential duodenal wall thickening and obscuration of intervening fat planes between the pancreatic parenchyma and the duodenum. Two small areas of hypo enhancement are identified in the pancreatic head, each measuring 1.5- 2.0 cm in maximum diameter. These show intermediate signal intensity on T2 weighted imaging and are intermediate to high signal intensity on T1 weighted imaging. These areas do not show substantial enhancement after IV contrast administration.  The portal vein, superior mesenteric vein, and splenic vein are patent. Celiac axis and superior mesenteric artery abnormal MR imaging features.  No focal abnormality is seen in the spleen. The stomach, adrenal glands, and kidneys are normal in appearance. There is no abdominal aortic aneurysm.  No abnormal marrow signal within the visualized bony structures.  IMPRESSION: Edema/ inflammation in the region of the  pancreatic head and descending duodenum, as noted on recent CT scan. Two abnormal areas within the pancreatic head parenchyma probably represent tiny fluid collections with proteinaceous debris or hemorrhage within. MR imaging features remain compatible with pancreatic head inflammation and secondary irritation of the duodenum. Alternatively, duodenitis/ peptic ulcer disease with secondary inflammation of the pancreas could have this appearance. While the study was performed to assess for pancreatic adenocarcinoma, given the degree of edema/inflammation within the pancreatic parenchyma and the heterogeneous pancreatic enhancement presumably resulting from these changes, it is not possible to exclude neoplastic involvement of the pancreas by imaging today. Consider a repeat MRI without and with contrast after the acute signs/symptoms of disease have resolved.   Electronically Signed   By: EMisty StanleyM.D.   On: 10/27/2013 08:31   Dg Chest Portable 1 View  10/24/2013   CLINICAL DATA:  Chest pain since yesterday.  EXAM: PORTABLE CHEST - 1 VIEW  COMPARISON:  None.  FINDINGS: Heart size and mediastinal contours are within normal limits. Both lungs are clear. Visualized skeletal structures are unremarkable.  IMPRESSION: Negative exam.   Electronically Signed   By: TInge RiseM.D.   On: 10/24/2013 13:02   Mr Abd  W/wo Cm/mrcp  10/27/2013   CLINICAL DATA:  Abdominal pain with abnormal pancreatic head and edema/ inflammation in the retroperitoneal tissues around the duodenum and pancreas.  EXAM: MRI ABDOMEN WITHOUT AND WITH CONTRAST (INCLUDING MRCP)  TECHNIQUE: Multiplanar multisequence MR imaging of the abdomen was performed both before and after the administration of intravenous contrast. Heavily T2-weighted images of the biliary and pancreatic ducts were obtained, and three-dimensional MRCP images were rendered by post processing.  CONTRAST:  1m MULTIHANCE GADOBENATE DIMEGLUMINE 529 MG/ML IV SOLN   COMPARISON:  CT scan from 10/24/2013.  FINDINGS: 9 mm cyst is identified in the posterior left liver (see image 36 series 11). Otherwise liver parenchyma is unremarkable.  Trace prominence of the intrahepatic biliary ducts is evident. Extrahepatic common duct diameter is 4 mm, normal. No filling defect within the bile ducts to suggest choledocholithiasis. No evidence for gallstones.  As seen on the recent CT scan, there is edema/inflammation involving the second segment of the duodenum and pancreatic head/ uncinate process. This is associated with circumferential duodenal wall thickening and obscuration of intervening fat planes between the pancreatic parenchyma and the duodenum. Two small areas of hypo enhancement are identified in the pancreatic head, each measuring 1.5- 2.0 cm in maximum diameter. These show intermediate signal intensity on T2 weighted imaging and are intermediate to high signal intensity on T1 weighted imaging. These areas do not show substantial enhancement after IV contrast administration.  The portal vein, superior mesenteric vein, and splenic vein are patent. Celiac axis and superior mesenteric artery abnormal MR imaging features.  No focal abnormality is seen in the spleen. The stomach, adrenal glands, and kidneys are normal in appearance. There is no abdominal aortic aneurysm.  No abnormal marrow signal within the visualized bony structures.  IMPRESSION: Edema/ inflammation in the region of the pancreatic head and descending duodenum, as noted on recent CT scan. Two abnormal areas within the pancreatic head parenchyma probably represent tiny fluid collections with proteinaceous debris or hemorrhage within. MR imaging features remain compatible with pancreatic head inflammation and secondary irritation of the duodenum. Alternatively, duodenitis/ peptic ulcer disease with secondary inflammation of the pancreas could have this appearance. While the study was performed to assess for pancreatic  adenocarcinoma, given the degree of edema/inflammation within the pancreatic parenchyma and the heterogeneous pancreatic enhancement presumably resulting from these changes, it is not possible to exclude neoplastic involvement of the pancreas by imaging today. Consider a repeat MRI without and with contrast after the acute signs/symptoms of disease have resolved.   Electronically Signed   By: EMisty StanleyM.D.   On: 10/27/2013 08:31    Microbiology: No results found for this or any previous visit (from the past 240 hour(s)).   Labs: Basic Metabolic Panel:  Recent Labs Lab 10/24/13 1125 10/25/13 1012 10/26/13 0527 10/27/13 0521  NA 135* 134* 138 136*  K 3.8 4.1 4.0 3.8  CL 95* 92* 98 98  CO2 26 28 29 27   GLUCOSE 174* 190* 166* 127*  BUN 12 8 8 7   CREATININE 1.23 1.06 1.10 1.10  CALCIUM 9.3 9.3 9.0 8.8   Liver Function Tests:  Recent Labs Lab 10/24/13 1125 10/25/13 1012  AST 16 15  ALT 25 21  ALKPHOS 71 65  BILITOT 0.8 0.7  PROT 7.5 7.9  ALBUMIN 4.1 4.0    Recent Labs Lab 10/24/13 1125 10/25/13 1012 10/26/13 0523 10/27/13 0745  LIPASE 67* 158* 61* 42   No results found for this basename: AMMONIA,  in the  last 168 hours CBC:  Recent Labs Lab 10/24/13 1125 10/25/13 1012 10/26/13 0527 10/27/13 0521  WBC 8.7 12.5* 11.3* 8.6  NEUTROABS 6.9 11.1*  --   --   HGB 15.6 15.9 14.2 13.1  HCT 44.4 44.7 41.4 38.3*  MCV 90.1 90.1 91.8 92.3  PLT 229 209 196 207   Cardiac Enzymes:  Recent Labs Lab 10/24/13 1125 10/25/13 1012  TROPONINI <0.30 <0.30   BNP: BNP (last 3 results) No results found for this basename: PROBNP,  in the last 8760 hours CBG:  Recent Labs Lab 10/28/13 1120 10/28/13 1650 10/28/13 2048 10/29/13 0752 10/29/13 1156  GLUCAP 111* 138* 148* 135* 109*       Signed:  Elynore Dolinski  [facility] 10/29/2013, 4:20 PM

## 2013-10-29 NOTE — Progress Notes (Signed)
Dc instructions reviewed with patient and wife.  Verbalized understanding.  Pt dc'd to home with wife. Schonewitz, Eulis Canner 10/29/2013

## 2013-10-30 ENCOUNTER — Telehealth: Payer: Self-pay | Admitting: Internal Medicine

## 2013-10-30 NOTE — Telephone Encounter (Signed)
Pt was just released from hospital on yesterday for pancreatitis and needs to see PCP ASAP, pt does not establish care with Dr. Mamie Nick until 4/1.  Will you see this pt for a post hosp? Please advise.

## 2013-10-30 NOTE — Telephone Encounter (Signed)
Tried to call with no answer  

## 2013-11-05 NOTE — Telephone Encounter (Signed)
i reviewed record  Advise get him in with a GI doc  . I see that he lives in [place].  ask him if he has one since this is not the first episode of pancreatitis    No PCP or GI docs  was mentioned in the dc summary. Although it  appears that [facility] has been trying to contact him to get appt  FU ( see phone notes )  He must have a previous pcp to manage his medications . Please find out who is managing his meds  If urgent intervention  is needed they may be able to help.  Until we can get him in.  Let me know to see  We  can see what path is best . And when we can fit him in

## 2013-11-06 NOTE — Telephone Encounter (Signed)
Pt returned my call.  Tried calling him back at the below listed number and received a message that this number is no longer in service.

## 2013-11-06 NOTE — Telephone Encounter (Signed)
Left message at the below listed number for the pt to return my call.

## 2013-11-07 NOTE — Telephone Encounter (Signed)
Tried reaching the patient.  Received a message that the number listed below is no longer in service.

## 2013-11-08 ENCOUNTER — Encounter: Payer: Self-pay | Admitting: Family Medicine

## 2013-11-08 NOTE — Telephone Encounter (Signed)
Tried reaching the patient and received a message that the number listed below is no longer in service.  Will now send a contact letter.

## 2013-11-13 ENCOUNTER — Telehealth: Payer: Self-pay | Admitting: Internal Medicine

## 2013-11-13 NOTE — Telephone Encounter (Signed)
Pt's wife calling on behalf of pt to request a sooner "new patient" appt with Dr.Panosh due to pt being diagnosed with pancreatitis.  Pt has an appt scheduled with Dr. Regis Bill on 01/03/14.  Pt also needs medication (Percocet) refilled as the hospital only prescribed a temporary supply.  Please advise.

## 2013-11-13 NOTE — Telephone Encounter (Signed)
Spoke to the pt.  Advised him that he should be seen by GI per [facility] as he has a hx of pancreatitis and they could refill his percocet.  Informed him that GI has been trying to reach him.  Notified him that I would get in contact with GI so they could reach him to make an appt.  Sent a staff message to Ginger at GI for their office to contact the pt.  Received a message back that the pt has an appt on 12/20/13 @ 3pm.

## 2013-11-22 ENCOUNTER — Encounter: Payer: Self-pay | Admitting: Gastroenterology

## 2013-11-22 ENCOUNTER — Ambulatory Visit (INDEPENDENT_AMBULATORY_CARE_PROVIDER_SITE_OTHER): Payer: BC Managed Care – PPO | Admitting: Gastroenterology

## 2013-11-22 VITALS — BP 151/96 | HR 83 | Temp 97.4°F | Ht 74.0 in | Wt 233.6 lb

## 2013-11-22 DIAGNOSIS — K859 Acute pancreatitis without necrosis or infection, unspecified: Secondary | ICD-10-CM

## 2013-11-22 DIAGNOSIS — K219 Gastro-esophageal reflux disease without esophagitis: Secondary | ICD-10-CM | POA: Insufficient documentation

## 2013-11-22 NOTE — Progress Notes (Signed)
Primary Care Physician:  Lottie Dawson, MD  Primary Gastroenterologist:  Barney Drain, MD   Chief Complaint  Patient presents with  . Abdominal Pain    HPI:  Todd Rodriguez is a 38 y.o. male here for further evaluation of pancreatitis. He was initially seen in emergent department on January 20. He was sent home but returned the following day for admission. He had a benign three-day hospitalization. Etiology for pancreatitis suspected to be related to alcohol use.  -Abdominal ultrasound showed fatty liver but otherwise no gallstones. -CT of the abdomen and pelvis with contrast showed stranding and edema in the pancreaticoduodenal groove with surrounding stranding mildly increased from study back in May of 2014. 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 Lipase was elevated at 67 at that time. The following day lipase is 158. -MRI abdomen showed edema/inflammation in the region of the pancreatic head and descending duodenum, 2 abnormal areas within the pancreatic head parenchyma probably representing tiny fluid collections with proteinaceous debris or hemorrhage. Features most compatible with pancreatic head inflammation and secondary irritation duodenum however cannot exclude daily /Peptic ulcer disease with secondary inflammation the pancreas. Study inconclusive for excluding neoplastic process with ongoing inflammation.  Patient reports that he has had an additional MRI last week in [place]. We have requested these records.  At this point he states he feels much better. He denies any abdominal pain. He is back to eating a regular diet. Denies any vomiting. Heartburn is well controlled on pantoprazole 40 mg daily. States he was discharged on twice a day but his insurance will not pay for it. Stopped the hctz while in hospital. States he had a similar episode back in May of 2014 but was not hospitalized at that time. At that  time his lipase was normal. Bowel movements are regular. No melena rectal bleeding.    Current Outpatient Prescriptions  Medication Sig Dispense Refill  . lisinopril (PRINIVIL,ZESTRIL) 20 MG tablet Take 1 tablet (20 mg total) by mouth daily.  30 tablet  2  . metFORMIN (GLUCOPHAGE) 500 MG tablet Take 500 mg by mouth 2 (two) times daily with a meal.      . pantoprazole (PROTONIX) 40 MG tablet Take 40 mg by mouth daily.       No current facility-administered medications for this visit.    Allergies as of 11/22/2013  . (No Known Allergies)    Past Medical History  Diagnosis Date  . Diabetes mellitus without complication   . Hypertension   . Pancreatitis     Past Surgical History  Procedure Laterality Date  . Spinal fusion    . Neck surgery      2 disc    Family History  Problem Relation Age of Onset  . Colon cancer Neg Hx   . Pancreatitis Neg Hx   . Pancreatic cancer Neg Hx   . Diabetes Mother   . Diabetes Father     History   Social History  . Marital Status: Married    Spouse Name: N/A    Number of Children: 1  . Years of Education: N/A   Occupational History  .  [place] History Main Topics  . Smoking status: Current Every Day Smoker -- 1.00 packs/day  . Smokeless tobacco: Not on file  . Alcohol Use: No     Comment: None 10/2013. Prior to that couple of days per week, heavy drinker for years. vodka, beer, states "usually something each  day"  . Drug Use: No  . Sexual Activity: Not on file   Other Topics Concern  . Not on file   Social History Narrative  . No narrative on file      ROS:  General: Negative for anorexia, weight loss, fever, chills, fatigue, weakness. Eyes: Negative for vision changes.  ENT: Negative for hoarseness, difficulty swallowing , nasal congestion. CV: Negative for chest pain, angina, palpitations, dyspnea on exertion, peripheral edema.  Respiratory: Negative for dyspnea at rest, dyspnea on exertion, cough,  sputum, wheezing.  GI: See history of present illness. GU:  Negative for dysuria, hematuria, urinary incontinence, urinary frequency, nocturnal urination.  MS: Negative for joint pain, low back pain.  Derm: Negative for rash or itching.  Neuro: Negative for weakness, abnormal sensation, seizure, frequent headaches, memory loss, confusion.  Psych: Negative for anxiety, depression, suicidal ideation, hallucinations.  Endo: Negative for unusual weight change.  Heme: Negative for bruising or bleeding. Allergy: Negative for rash or hives.    Physical Examination:  BP 151/96  Pulse 83  Temp(Src) 97.4 F (36.3 C) (Oral)  Ht 6' 2"  (1.88 m)  Wt 233 lb 9.6 oz (105.96 kg)  BMI 29.98 kg/m2   General: Well-nourished, well-developed in no acute distress.  Head: Normocephalic, atraumatic.   Eyes: Conjunctiva pink, no icterus. Mouth: Oropharyngeal mucosa moist and pink , no lesions erythema or exudate. Neck: Supple without thyromegaly, masses, or lymphadenopathy.  Lungs: Clear to auscultation bilaterally.  Heart: Regular rate and rhythm, no murmurs rubs or gallops.  Abdomen: Bowel sounds are normal, nontender, nondistended, no hepatosplenomegaly or masses, no abdominal bruits or    hernia , no rebound or guarding.   Rectal: Not performed Extremities: No lower extremity edema. No clubbing or deformities.  Neuro: Alert and oriented x 4 , grossly normal neurologically.  Skin: Warm and dry, no rash or jaundice.   Psych: Alert and cooperative, normal mood and affect.  Labs: Lab Results  Component Value Date   LIPASE 42 10/27/2013   Lab Results  Component Value Date   CREATININE 1.10 10/27/2013   BUN 7 10/27/2013   NA 136* 10/27/2013   K 3.8 10/27/2013   CL 98 10/27/2013   CO2 27 10/27/2013   Lab Results  Component Value Date   ALT 21 10/25/2013   AST 15 10/25/2013   ALKPHOS 65 10/25/2013   BILITOT 0.7 10/25/2013   Lab Results  Component Value Date   WBC 8.6 10/27/2013   HGB 13.1 10/27/2013    HCT 38.3* 10/27/2013   MCV 92.3 10/27/2013   PLT 207 10/27/2013     Imaging Studies: US Abdomen Complete  10/24/2013   CLINICAL DATA:  Abdominal pain.  EXAM: ULTRASOUND ABDOMEN COMPLETE  COMPARISON:  CT, 02/19/2013  FINDINGS: Gallbladder:  No gallstones or wall thickening visualized. No sonographic Murphy sign noted.  Common bile duct:  Diameter: 4.1 mm  Liver:  Liver is mildly enlarged there is increased liver echogenicity consistent with fatty infiltration. Focal fatty sparing is noted along the gallbladder fossa. No liver masses. Hepatopetal flow was documented in the portal vein.  IVC:  No abnormality visualized.  Pancreas:  Visualized portion unremarkable.  Spleen:  Size and appearance within normal limits.  Right Kidney:  Length: 11.9 cm. Echogenicity within normal limits. No mass or hydronephrosis visualized.  Left Kidney:  Length: 11.0 cm. Echogenicity within normal limits. No mass or hydronephrosis visualized.  Abdominal aorta:  No aneurysm visualized.  Other findings:  None.  IMPRESSION: 1. Hepatic steatosis  with focal fatty sparing along the gallbladder fossa. 2. No other abnormalities.  No acute findings.   Electronically Signed   By: Lajean Manes M.D.   On: 10/24/2013 13:23   Ct Abdomen Pelvis W Contrast  10/24/2013   CLINICAL DATA:  Intermittent epigastric pain with nausea and vomiting. Diabetes. Hypertension.  EXAM: CT ABDOMEN AND PELVIS WITH CONTRAST  TECHNIQUE: Multidetector CT imaging of the abdomen and pelvis was performed using the standard protocol following bolus administration of intravenous contrast.  CONTRAST:  64m OMNIPAQUE IOHEXOL 300 MG/ML SOLN, 1035mOMNIPAQUE IOHEXOL 300 MG/ML SOLN  COMPARISON:  [place] dated 10/24/2013; CT ABD/PELV WO CM dated 02/19/2013  FINDINGS: Mild airway thickening observed in both lower lobes. There is abnormal hypodensity within or along the head of the pancreas and uncinate process with abnormal edema and stranding in this vicinity is  and tracking around the descending duodenum and proximal transverse duodenum. The remainder the pancreas appears normal, without generalized peripancreatic stranding. The extrahepatic bile ducts are obscured by the surrounding edema/stranding. There is no intrahepatic biliary dilatation, and the gallbladder does not appear to be the epicenter of the inflammatory process.  Adrenal glands, spleen, and liver unremarkable. Kidneys and proximal ureters unremarkable. No pathologic upper abdominal adenopathy is observed. Mild aortoiliac atherosclerosis noted.  Appendix normal. No dilated bowel. No free pelvic fluid. Urinary bladder normal.  IMPRESSION: 1. Abnormal continued appearance of stranding/edema in the pancreaticoduodenal groove with surrounding stranding mildly increased compared to the prior exam. 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 Differential diagnostic considerations include adenocarcinoma in the head of the pancreas and duodenal ulceration or inflamed duodenal diverticulum in the periampullary region. The likelihood of adenocarcinoma seems remote given the patient's relatively young age.   Electronically Signed   By: WaSherryl Barters.D.   On: 10/24/2013 15:43   Mr 3d Recon At Scanner  10/27/2013   CLINICAL DATA:  Abdominal pain with abnormal pancreatic head and edema/ inflammation in the retroperitoneal tissues around the duodenum and pancreas.  EXAM: MRI ABDOMEN WITHOUT AND WITH CONTRAST (INCLUDING MRCP)  TECHNIQUE: Multiplanar multisequence MR imaging of the abdomen was performed both before and after the administration of intravenous contrast. Heavily T2-weighted images of the biliary and pancreatic ducts were obtained, and three-dimensional MRCP images were rendered by post processing.  CONTRAST:  2073mULTIHANCE GADOBENATE DIMEGLUMINE 529 MG/ML IV SOLN  COMPARISON:  CT scan from 10/24/2013.   FINDINGS: 9 mm cyst is identified in the posterior left liver (see image 36 series 11). Otherwise liver parenchyma is unremarkable.  Trace prominence of the intrahepatic biliary ducts is evident. Extrahepatic common duct diameter is 4 mm, normal. No filling defect within the bile ducts to suggest choledocholithiasis. No evidence for gallstones.  As seen on the recent CT scan, there is edema/inflammation involving the second segment of the duodenum and pancreatic head/ uncinate process. This is associated with circumferential duodenal wall thickening and obscuration of intervening fat planes between the pancreatic parenchyma and the duodenum. Two small areas of hypo enhancement are identified in the pancreatic head, each measuring 1.5- 2.0 cm in maximum diameter. These show intermediate signal intensity on T2 weighted imaging and are intermediate to high signal intensity on T1 weighted imaging. These areas do not show substantial enhancement after IV contrast administration.  The portal vein, superior mesenteric vein, and splenic vein are patent. Celiac axis and superior mesenteric artery abnormal MR imaging features.  No  focal abnormality is seen in the spleen. The stomach, adrenal glands, and kidneys are normal in appearance. There is no abdominal aortic aneurysm.  No abnormal marrow signal within the visualized bony structures.  IMPRESSION: Edema/ inflammation in the region of the pancreatic head and descending duodenum, as noted on recent CT scan. Two abnormal areas within the pancreatic head parenchyma probably represent tiny fluid collections with proteinaceous debris or hemorrhage within. MR imaging features remain compatible with pancreatic head inflammation and secondary irritation of the duodenum. Alternatively, duodenitis/ peptic ulcer disease with secondary inflammation of the pancreas could have this appearance. While the study was performed to assess for pancreatic adenocarcinoma, given the degree of  edema/inflammation within the pancreatic parenchyma and the heterogeneous pancreatic enhancement presumably resulting from these changes, it is not possible to exclude neoplastic involvement of the pancreas by imaging today. Consider a repeat MRI without and with contrast after the acute signs/symptoms of disease have resolved.   Electronically Signed   By: Misty Stanley M.D.   On: 10/27/2013 08:31   Dg Chest Portable 1 View  10/24/2013   CLINICAL DATA:  Chest pain since yesterday.  EXAM: PORTABLE CHEST - 1 VIEW  COMPARISON:  None.  FINDINGS: Heart size and mediastinal contours are within normal limits. Both lungs are clear. Visualized skeletal structures are unremarkable.  IMPRESSION: Negative exam.   Electronically Signed   By: Inge Rise M.D.   On: 10/24/2013 13:02   Mr Jeananne Rama W/wo Cm/mrcp  10/27/2013   CLINICAL DATA:  Abdominal pain with abnormal pancreatic head and edema/ inflammation in the retroperitoneal tissues around the duodenum and pancreas.  EXAM: MRI ABDOMEN WITHOUT AND WITH CONTRAST (INCLUDING MRCP)  TECHNIQUE: Multiplanar multisequence MR imaging of the abdomen was performed both before and after the administration of intravenous contrast. Heavily T2-weighted images of the biliary and pancreatic ducts were obtained, and three-dimensional MRCP images were rendered by post processing.  CONTRAST:  92m MULTIHANCE GADOBENATE DIMEGLUMINE 529 MG/ML IV SOLN  COMPARISON:  CT scan from 10/24/2013.  FINDINGS: 9 mm cyst is identified in the posterior left liver (see image 36 series 11). Otherwise liver parenchyma is unremarkable.  Trace prominence of the intrahepatic biliary ducts is evident. Extrahepatic common duct diameter is 4 mm, normal. No filling defect within the bile ducts to suggest choledocholithiasis. No evidence for gallstones.  As seen on the recent CT scan, there is edema/inflammation involving the second segment of the duodenum and pancreatic head/ uncinate process. This is associated  with circumferential duodenal wall thickening and obscuration of intervening fat planes between the pancreatic parenchyma and the duodenum. Two small areas of hypo enhancement are identified in the pancreatic head, each measuring 1.5- 2.0 cm in maximum diameter. These show intermediate signal intensity on T2 weighted imaging and are intermediate to high signal intensity on T1 weighted imaging. These areas do not show substantial enhancement after IV contrast administration.  The portal vein, superior mesenteric vein, and splenic vein are patent. Celiac axis and superior mesenteric artery abnormal MR imaging features.  No focal abnormality is seen in the spleen. The stomach, adrenal glands, and kidneys are normal in appearance. There is no abdominal aortic aneurysm.  No abnormal marrow signal within the visualized bony structures.  IMPRESSION: Edema/ inflammation in the region of the pancreatic head and descending duodenum, as noted on recent CT scan. Two abnormal areas within the pancreatic head parenchyma probably represent tiny fluid collections with proteinaceous debris or hemorrhage within. MR imaging features remain compatible with pancreatic head  inflammation and secondary irritation of the duodenum. Alternatively, duodenitis/ peptic ulcer disease with secondary inflammation of the pancreas could have this appearance. While the study was performed to assess for pancreatic adenocarcinoma, given the degree of edema/inflammation within the pancreatic parenchyma and the heterogeneous pancreatic enhancement presumably resulting from these changes, it is not possible to exclude neoplastic involvement of the pancreas by imaging today. Consider a repeat MRI without and with contrast after the acute signs/symptoms of disease have resolved.   Electronically Signed   By: Misty Stanley M.D.   On: 10/27/2013 08:31

## 2013-11-22 NOTE — Assessment & Plan Note (Signed)
Well-controlled on current regimen. ?

## 2013-11-22 NOTE — Patient Instructions (Signed)
1. Please continue to avoid alcohol. This is in your best interest especially with regards to protecting your pancreas from further damage. 2. Once we review your most recent MRI findings, we will give further recommendations. 3. Continue pantoprazole once daily. 4. Call if you have recurrent abdominal pain.

## 2013-11-22 NOTE — Assessment & Plan Note (Signed)
Suspect pancreatitis related to alcohol use. Patient reports last MRI done in [place] was normal. We have requested these results. Findings so far have been managed most consistent with pancreatitis rather than a duodenal process. Not mentioned above however patient did have history of aspirin powder use which he has not used since his hospitalization. He has had one alcoholic beverage since his hospitalization. Triglyceride levels unremarkable. Serum calcium was normal. Abdominal ultrasound showed no evidence of stones.  Obtain copy of MRI findings. Further recommendations to follow. He'll call if he has any recurrent symptoms. Avoid alcohol.

## 2013-11-23 NOTE — Progress Notes (Signed)
cc'd to pcp 

## 2014-01-03 ENCOUNTER — Telehealth: Payer: Self-pay | Admitting: Internal Medicine

## 2014-01-03 ENCOUNTER — Ambulatory Visit (INDEPENDENT_AMBULATORY_CARE_PROVIDER_SITE_OTHER): Payer: BC Managed Care – PPO | Admitting: Internal Medicine

## 2014-01-03 ENCOUNTER — Encounter: Payer: Self-pay | Admitting: Internal Medicine

## 2014-01-03 VITALS — BP 166/102 | HR 78 | Temp 98.3°F | Ht 73.0 in | Wt 233.0 lb

## 2014-01-03 DIAGNOSIS — I10 Essential (primary) hypertension: Secondary | ICD-10-CM

## 2014-01-03 DIAGNOSIS — E785 Hyperlipidemia, unspecified: Secondary | ICD-10-CM

## 2014-01-03 DIAGNOSIS — K219 Gastro-esophageal reflux disease without esophagitis: Secondary | ICD-10-CM

## 2014-01-03 DIAGNOSIS — IMO0001 Reserved for inherently not codable concepts without codable children: Secondary | ICD-10-CM

## 2014-01-03 DIAGNOSIS — F17200 Nicotine dependence, unspecified, uncomplicated: Secondary | ICD-10-CM

## 2014-01-03 DIAGNOSIS — E1165 Type 2 diabetes mellitus with hyperglycemia: Principal | ICD-10-CM

## 2014-01-03 DIAGNOSIS — Z8719 Personal history of other diseases of the digestive system: Secondary | ICD-10-CM

## 2014-01-03 MED ORDER — ATORVASTATIN CALCIUM 20 MG PO TABS: 20.0000 mg | ORAL_TABLET | Freq: Every day | ORAL | Status: DC

## 2014-01-03 MED ORDER — LISINOPRIL-HYDROCHLOROTHIAZIDE 20-25 MG PO TABS: 1.0000 | ORAL_TABLET | Freq: Every day | ORAL | Status: DC

## 2014-01-03 NOTE — Assessment & Plan Note (Signed)
Plan for followup A1c in about 3 months lab immunizations other parameters if needed

## 2014-01-03 NOTE — Assessment & Plan Note (Signed)
Elevation today will add back the diuretic with caution get back with [place] with any recurrent symptoms

## 2014-01-03 NOTE — Assessment & Plan Note (Signed)
Continue on Protonix we'll refill at this time uncertain if GI should be managing this complaint but he is stable at this time

## 2014-01-03 NOTE — Telephone Encounter (Signed)
Relevant patient education mailed to patient.  

## 2014-01-03 NOTE — Patient Instructions (Addendum)
Begin  lipitor gemeric for cholesterol.  Refill the protonix  Today stay on this.  Blood pressure    Is up  Monitor and we will change back to diuretic based medication  Contact us if have problem s   bmp lipid [id number] pre visit in about 3 months or as needed. Avoid all alcohol at this time. Continue tobacco cessation process.

## 2014-01-03 NOTE — Progress Notes (Signed)
Pre visit review using our clinic review tool, if applicable. No additional management support is needed unless otherwise documented below in the visit note.   Chief Complaint  Patient presents with  . Establish Care    Pt is changing PCP.  Marland Kitchen Hypertension  . Diabetes    HPI: Patient comes in as new patient visit . Spouse Aniceto Boss in  our practice   He has diabetes been on medication for 2 years metformin no side effects had checks his blood sugar reasonably well-controlled denies complication has had eye check within last year normal. No neuropathy hypoglycemia.  Tobacco dependence;  has been on one pack per day trying to quit has been on chantix for about 2 months.  gerd ; Out of medication.  Alanda Amass had given him omeprazole and trhen changed at [facility]. When he was hospitalized earlier this year for acute pancreatitis of unknown cause possible alcohol.  Refill protonix needed it seems to help his reflux gastritis.   History of pancreatitis earlier in this year February; has been seen at [facility] him GI post hospital awaiting records do not feel he is having destructive lesion or secondary problem. When he was hospitalized with severe pain and some vomiting his hydrochlorothiazide was stopped in case it was adding to his problem. He is discharged and has drank only a rare alcohol since them before he was admitted he stated he had only one to 2 drinks a night after he got home from work sleep during the day. 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 Denies a significant alcohol problem otherwise.  Bp up off hctz and    Stress uncle to go to [employer] may have terminal respiratory failure.  Lipids;  Used to be on crestor   Not prescrived at some point and after hospitalization Review of Systems  Constitutional: Negative.   HENT: Negative.   Respiratory: Negative.   Cardiovascular: Negative.   Genitourinary: Negative.   Musculoskeletal: Negative.   Skin: Negative.    Neurological: Negative.   Endo/Heme/Allergies: Negative.   Psychiatric/Behavioral: Negative.      Past Medical History  Diagnosis Date  . Diabetes mellitus without complication   . Hypertension   . Pancreatitis   . GERD (gastroesophageal reflux disease)     Family History  Problem Relation Age of Onset  . Colon cancer Neg Hx   . Pancreatitis Neg Hx   . Pancreatic cancer Neg Hx   . Diabetes Mother   . Hypertension Mother   . Diabetes Father   . Hypertension Father   . Arthritis Maternal Grandmother   . Hypertension Maternal Grandfather   . Diabetes Maternal Grandfather     History   Social History  . Marital Status: Married    Spouse Name: N/A    Number of Children: 1  . Years of Education: N/A   Occupational History  .  [place] History Main Topics  . Smoking status: Current Every Day Smoker -- 1.00 packs/day  . Smokeless tobacco: Not on file  . Alcohol Use: Yes     Comment: Social Drinker  . Drug Use: No  . Sexual Activity: Yes    Partners: Female     Comment: Patient's Wife   Other Topics Concern  . Not on file   Social History Narrative   5 hours of sleep    2 people living in the home   A dog living in the home   [employer]  education tirerunner    Eye exam nany clark    FA tobacco some etoh neg rd exercises             Outpatient Encounter Prescriptions as of 01/03/2014  Medication Sig  . metFORMIN (GLUCOPHAGE) 500 MG tablet Take 500 mg by mouth 2 (two) times daily with a meal.  . pantoprazole (PROTONIX) 40 MG tablet Take 40 mg by mouth daily.  . tadalafil (CIALIS) 20 MG tablet Take 20 mg by mouth daily as needed for erectile dysfunction.  . [DISCONTINUED] lisinopril (PRINIVIL,ZESTRIL) 20 MG tablet Take 1 tablet (20 mg total) by mouth daily.  Marland Kitchen atorvastatin (LIPITOR) 20 MG tablet Take 1 tablet (20 mg total) by mouth daily.  Marland Kitchen lisinopril-hydrochlorothiazide (PRINZIDE,ZESTORETIC) 20-25 MG per tablet Take 1 tablet by mouth  daily.    EXAM:  BP 166/102  Pulse 78  Temp(Src) 98.3 F (36.8 C) (Oral)  Ht 6' 1"  (1.854 m)  Wt 233 lb (105.688 kg)  BMI 30.75 kg/m2  SpO2 98%  Body mass index is 30.75 kg/(m^2). Physical Exam: Vital signs reviewed WRU:EAVW is a well-developed well-nourished alert cooperative   male  who appears   stated age in no acute distress.  HEENT: normocephalic  traumatic , Eyes: PERRL EOM's full, conjunctiva pink  Nares: patent no deformity discharge or tenderness., Ears: no deformity EAC's clear TMs with normal landmarks. Mouth: clear OP, no lesions, edema.  Moist mucous membranes. Dentition in adequate repair. NECK: supple without masses, thyromegaly or bruits. CHEST/PULM:  Clear to auscultation and percussion breath sounds equal no wheeze , rales or rhonchi. No chest wall deformities or tenderness. CV: PMI is nondisplaced, S1 S2 no gallops, murmurs, rubs. Peripheral pulses are full without delay.No JVD .  ABDOMEN: Bowel sounds normal nontender  No guard or rebound, no hepato splenomegal no CVA tenderness.   Extremtities:  No clubbing cyanosis or edema, no acute joint swelling or redness no focal atrophy NEURO:  Oriented x3, cranial nerves 3-12 appear to be intact, no obvious focal weakness,gait within normal limits no abnormal reflexes or asymmetrical SKIN: No acute rashes normal turgor, color, no bruising or petechiae. PSYCH: Oriented, good eye contact, no obvious depression anxiety, cognition and judgment appear normal. LN:  No cervical  adenopathy   Lab Results  Component Value Date   WBC 8.6 10/27/2013   HGB 13.1 10/27/2013   HCT 38.3* 10/27/2013   PLT 207 10/27/2013   GLUCOSE 127* 10/27/2013   CHOL 199 10/26/2013   TRIG 219* 10/26/2013   HDL 45 10/26/2013   LDLCALC 110* 10/26/2013   ALT 21 10/25/2013   AST 15 10/25/2013   NA 136* 10/27/2013   K 3.8 10/27/2013   CL 98 10/27/2013   CREATININE 1.10 10/27/2013   BUN 7 10/27/2013   CO2 27 10/27/2013   HGBA1C 7.1* 10/25/2013     ASSESSMENT AND PLAN:  Discussed the following assessment and plan:  Type II or unspecified type diabetes mellitus without mention of complication, uncontrolled - on metformin  Essential hypertension, benign  Hx of acute pancreatitis - pt says no fu uncertain if this is correct  certainly stable at this time avoid etoh  GERD (gastroesophageal reflux disease)  Tobacco dependence  Other and unspecified hyperlipidemia - Had been on Crestor before hospitalization we'll give atorvastatin and followup labs in 3 months.  -Patient advised to return or notify health care team  if symptoms worsen ,persist or new concerns arise.  Patient Instructions  Begin  lipitor gemeric for cholesterol.  Refill the protonix  Today stay on this.  Blood pressure    Is up  Monitor and we will change back to diuretic based medication  Contact us if have problem s   bmp lipid hgba1c pre visit in about 3 months or as needed. Avoid all alcohol at this time. Continue tobacco cessation process.    Standley Brooking. Panosh M.D.

## 2014-01-09 ENCOUNTER — Other Ambulatory Visit: Payer: Self-pay | Admitting: Internal Medicine

## 2014-01-22 ENCOUNTER — Telehealth: Payer: Self-pay

## 2014-01-22 NOTE — Telephone Encounter (Signed)
Relevant patient education assigned to patient using Emmi. ° °

## 2014-03-26 ENCOUNTER — Encounter: Payer: Self-pay | Admitting: Internal Medicine

## 2014-03-26 ENCOUNTER — Ambulatory Visit (INDEPENDENT_AMBULATORY_CARE_PROVIDER_SITE_OTHER)
Admission: RE | Admit: 2014-03-26 | Discharge: 2014-03-26 | Disposition: A | Discharge status: Home / Self Care | Payer: BC Managed Care – PPO | Source: Ambulatory Visit | Attending: Internal Medicine | Admitting: Internal Medicine

## 2014-03-26 ENCOUNTER — Ambulatory Visit (INDEPENDENT_AMBULATORY_CARE_PROVIDER_SITE_OTHER): Payer: BC Managed Care – PPO | Admitting: Internal Medicine

## 2014-03-26 VITALS — BP 130/80 | Temp 98.4°F | Ht 73.0 in | Wt 212.0 lb

## 2014-03-26 DIAGNOSIS — R509 Fever, unspecified: Secondary | ICD-10-CM

## 2014-03-26 DIAGNOSIS — R079 Chest pain, unspecified: Secondary | ICD-10-CM

## 2014-03-26 DIAGNOSIS — F17200 Nicotine dependence, unspecified, uncomplicated: Secondary | ICD-10-CM

## 2014-03-26 DIAGNOSIS — R05 Cough: Secondary | ICD-10-CM

## 2014-03-26 DIAGNOSIS — IMO0001 Reserved for inherently not codable concepts without codable children: Secondary | ICD-10-CM

## 2014-03-26 DIAGNOSIS — R059 Cough, unspecified: Secondary | ICD-10-CM

## 2014-03-26 DIAGNOSIS — E1165 Type 2 diabetes mellitus with hyperglycemia: Secondary | ICD-10-CM

## 2014-03-26 LAB — HEPATIC FUNCTION PANEL
ALT: 29 U/L (ref 0–53)
AST: 28 U/L (ref 0–37)
Albumin: 4.6 g/dL (ref 3.5–5.2)
Alkaline Phosphatase: 57 U/L (ref 39–117)
BILIRUBIN DIRECT: 0.1 mg/dL (ref 0.0–0.3)
BILIRUBIN TOTAL: 1 mg/dL (ref 0.2–1.2)
Total Protein: 8.8 g/dL — ABNORMAL HIGH (ref 6.0–8.3)

## 2014-03-26 LAB — CBC WITH DIFFERENTIAL/PLATELET
BASOS PCT: 0.4 % (ref 0.0–3.0)
Basophils Absolute: 0 10*3/uL (ref 0.0–0.1)
EOS ABS: 0.2 10*3/uL (ref 0.0–0.7)
Eosinophils Relative: 2.1 % (ref 0.0–5.0)
HCT: 46.8 % (ref 39.0–52.0)
Hemoglobin: 15.9 g/dL (ref 13.0–17.0)
LYMPHS PCT: 22.7 % (ref 12.0–46.0)
Lymphs Abs: 1.7 10*3/uL (ref 0.7–4.0)
MCHC: 33.9 g/dL (ref 30.0–36.0)
MCV: 91.5 fl (ref 78.0–100.0)
MONO ABS: 0.8 10*3/uL (ref 0.1–1.0)
Monocytes Relative: 10.4 % (ref 3.0–12.0)
NEUTROS PCT: 64.4 % (ref 43.0–77.0)
Neutro Abs: 4.8 10*3/uL (ref 1.4–7.7)
Platelets: 331 10*3/uL (ref 150.0–400.0)
RBC: 5.12 Mil/uL (ref 4.22–5.81)
RDW: 13.3 % (ref 11.5–15.5)
WBC: 7.4 10*3/uL (ref 4.0–10.5)

## 2014-03-26 LAB — BASIC METABOLIC PANEL
BUN: 15 mg/dL (ref 6–23)
CALCIUM: 10.3 mg/dL (ref 8.4–10.5)
CO2: 33 meq/L — AB (ref 19–32)
CREATININE: 1.1 mg/dL (ref 0.4–1.5)
Chloride: 92 mEq/L — ABNORMAL LOW (ref 96–112)
GFR: 97.38 mL/min (ref >60.00)
Glucose, Bld: 178 mg/dL — ABNORMAL HIGH (ref 70–99)
Potassium: 4.3 mEq/L (ref 3.5–5.1)
Sodium: 134 mEq/L — ABNORMAL LOW (ref 135–145)

## 2014-03-26 LAB — HEMOGLOBIN A1C: HEMOGLOBIN A1C: 7.3 % — AB (ref 4.6–6.5)

## 2014-03-26 LAB — CK: Total CK: 359 U/L — ABNORMAL HIGH (ref 7–232)

## 2014-03-26 NOTE — Patient Instructions (Signed)
Acts viral but long lasting  Get chest x ray to check for pneurmonia  If you have persistent fever we may add doxycycline  As we discussed but ant to get back lab firs No work for the next 2-3 days until improved .  Will do fmla form when have more information.

## 2014-03-26 NOTE — Progress Notes (Signed)
Chief Complaint  Patient presents with  . Fever  . Generalized Body Aches    Ongoing for 1 wk. Recently traveled to [place] for vacation.  . Nausea  . Diarrhea    HPI:  Patient comes in today for SDA for  new problem evaluation. Also has FMLA form missed owrk June 14 17 and 22 for illness  Works good year  [employer]   Last visit was t to establish he is a 38 yo 1ppd smoker diabetic and hx of pancreatitis hosptialized  Poss from etoh  Ht gerd  And hyperlipidemia .  He presents with 8 days of sx   Stayed in bed in [place].    Onset with headache and cough and drainage  Body aches fatigue  Achy  And then diarrhea and vomiting .for 1-2 days     V and d better . Now left side  Hurting  Chest .   Still coughing  Dry cough .  Drippy no sob.,  Fatigue and body aches. Stayed in bed in [place]. wehre he traveled for his son 4 days ago . HA now better   Took ibuprofen  Once a day. Some help.  sudafed.   Wife has a cold. Not this sick   No tick bites or rash   101.  fri and Saturday.   2 days ago no chills  ? None last pm?  ROS: See pertinent positives and negatives per HPI. No uti sx pancreatitis sx no recent etoh.   Past Medical History  Diagnosis Date  . Diabetes mellitus without complication   . Hypertension   . Pancreatitis   . GERD (gastroesophageal reflux disease)     Family History  Problem Relation Age of Onset  . Colon cancer Neg Hx   . Pancreatitis Neg Hx   . Pancreatic cancer Neg Hx   . Diabetes Mother   . Hypertension Mother   . Diabetes Father   . Hypertension Father   . Arthritis Maternal Grandmother   . Hypertension Maternal Grandfather   . Diabetes Maternal Grandfather     History   Social History  . Marital Status: Married    Spouse Name: N/A    Number of Children: 1  . Years of Education: N/A   Occupational History  .  [place] History Main Topics  . Smoking status: Current Every Day Smoker -- 1.00 packs/day  . Smokeless tobacco: None    . Alcohol Use: Yes     Comment: Social Drinker  . Drug Use: No  . Sexual Activity: Yes    Partners: Female     Comment: Patient's Wife   Other Topics Concern  . 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 atorvastatin (LIPITOR) 20 MG tablet Take 1 tablet (20 mg total) by mouth daily.  Marland Kitchen lisinopril-hydrochlorothiazide (PRINZIDE,ZESTORETIC) 20-25 MG per tablet Take 1 tablet by mouth daily.  . metFORMIN (GLUCOPHAGE) 500 MG tablet Take 500 mg by mouth 2 (two) times daily with a meal.  . pantoprazole (PROTONIX) 40 MG tablet TAKE 1 TABLET BY MOUTH TWICE A DAY  .  tadalafil (CIALIS) 20 MG tablet Take 20 mg by mouth daily as needed for erectile dysfunction.    EXAM:  BP 130/80  Temp(Src) 98.4 F (36.9 C) (Oral)  Ht 6' 1"  (1.854 m)  Wt 212 lb (96.163 kg)  BMI 27.98 kg/m2  Body mass index is 27.98 kg/(m^2).  GENERAL: vitals reviewed and listed above, alert, oriented, appears well hydrated and in no acute distress red eyes sick non toxic tired  HEENT: atraumatic, conjunctiva  clear, no obvious abnormalities on inspection of external nose and ears OP : no lesion edema or exudate  NECK: no obvious masses on inspection palpation no adenopathy supple  LUNGS: clear to auscultation bilaterally, no wheezes, rales or rhonchi, good air movement CV: HRRR, no clubbing cyanosis or  peripheral edema nl cap refill  Abdomen:  Sof,t normal bowel sounds without hepatosplenomegaly, no guarding rebound or masses no CVA tenderness Skin nl turgor no rashes hands feet no petechia MS: moves all extremities without noticeable focal  abnormality PSYCH: pleasant and cooperative, no obvious depression or anxiety  ASSESSMENT AND PLAN:  Discussed the  following assessment and plan:  Fever, unspecified - Plan: DG Chest 2 View, Basic metabolic panel, CBC with Differential, Hemoglobin A1c, Hepatic function panel, CK, POCT Urinalysis, Dipstick  Myalgia and myositis - Plan: DG Chest 2 View, Basic metabolic panel, CBC with Differential, Hemoglobin A1c, Hepatic function panel, CK, POCT Urinalysis, Dipstick  Cough - Plan: DG Chest 2 View, Basic metabolic panel, CBC with Differential, Hemoglobin A1c, Hepatic function panel, CK, POCT Urinalysis, Dipstick  Right-sided chest pain - Plan: DG Chest 2 View, Basic metabolic panel, CBC with Differential, Hemoglobin A1c, Hepatic function panel, CK, POCT Urinalysis, Dipstick  Type II or unspecified type diabetes mellitus without mention of complication, uncontrolled  Tobacco dependence Flu like illness but seems prolonged although seem like fever may be subsiding  c xray labs  Observe will do fmla form before July 6  No work unless better by Friday June 26   -Patient advised to return or notify health care team  if symptoms worsen ,persist or new concerns arise.  Patient Instructions  Acts viral but long lasting  Get chest x ray to check for pneurmonia  If you have persistent fever we may add doxycycline  As we discussed but ant to get back lab firs No work for the next 2-3 days until improved .  Will do fmla form when have more information.    Standley Brooking. Panosh M.D.  Pre visit review using our clinic review tool, if applicable. No additional management support is needed unless otherwise documented below in the visit note.

## 2014-04-03 ENCOUNTER — Telehealth: Payer: Self-pay | Admitting: Internal Medicine

## 2014-04-03 NOTE — Telephone Encounter (Signed)
Pt following up on paperwork that he dropped off. pls call

## 2014-04-04 ENCOUNTER — Telehealth: Payer: Self-pay | Admitting: Internal Medicine

## 2014-04-04 NOTE — Telephone Encounter (Signed)
Pt states that [facility] has been unsuccessful with getting in touch with dr. Regis Bill regarding pt's disability paperwork.

## 2014-04-04 NOTE — Telephone Encounter (Signed)
LMOM for pt to call office back and ask for [facility] or myself.

## 2014-04-04 NOTE — Telephone Encounter (Signed)
Patient's wife called this morning to follow up on request for paperwork.

## 2014-04-04 NOTE — Telephone Encounter (Signed)
Misty  I gave you the fmla form this am  Please get it to the correct people  Thanks [facility]

## 2014-04-05 NOTE — Telephone Encounter (Signed)
Patient notified to pick up at the front desk.  Copy sent to scan.

## 2014-04-11 ENCOUNTER — Other Ambulatory Visit: Payer: Self-pay | Admitting: *Deleted

## 2014-04-11 DIAGNOSIS — E119 Type 2 diabetes mellitus without complications: Secondary | ICD-10-CM

## 2014-04-11 DIAGNOSIS — E785 Hyperlipidemia, unspecified: Secondary | ICD-10-CM

## 2014-04-12 ENCOUNTER — Other Ambulatory Visit (INDEPENDENT_AMBULATORY_CARE_PROVIDER_SITE_OTHER): Payer: BC Managed Care – PPO

## 2014-04-12 DIAGNOSIS — E119 Type 2 diabetes mellitus without complications: Secondary | ICD-10-CM

## 2014-04-12 DIAGNOSIS — E785 Hyperlipidemia, unspecified: Secondary | ICD-10-CM

## 2014-04-12 LAB — BASIC METABOLIC PANEL
BUN: 10 mg/dL (ref 6–23)
CALCIUM: 9.6 mg/dL (ref 8.4–10.5)
CO2: 29 meq/L (ref 19–32)
Chloride: 102 mEq/L (ref 96–112)
Creatinine, Ser: 1.2 mg/dL (ref 0.4–1.5)
GFR: 83.01 mL/min (ref >60.00)
Glucose, Bld: 177 mg/dL — ABNORMAL HIGH (ref 70–99)
Potassium: 3.7 mEq/L (ref 3.5–5.1)
SODIUM: 139 meq/L (ref 135–145)

## 2014-04-12 LAB — HEMOGLOBIN A1C: Hgb A1c MFr Bld: 7.3 % — ABNORMAL HIGH (ref 4.6–6.5)

## 2014-04-12 LAB — LIPID PANEL
CHOL/HDL RATIO: 4
Cholesterol: 152 mg/dL (ref 0–200)
HDL: 41.9 mg/dL (ref >39.00)
LDL Cholesterol: 50 mg/dL (ref 0–99)
NonHDL: 110.1
TRIGLYCERIDES: 302 mg/dL — AB (ref 0.0–149.0)
VLDL: 60.4 mg/dL — AB (ref 0.0–40.0)

## 2014-04-20 ENCOUNTER — Encounter: Payer: Self-pay | Admitting: Internal Medicine

## 2014-04-20 ENCOUNTER — Ambulatory Visit (INDEPENDENT_AMBULATORY_CARE_PROVIDER_SITE_OTHER): Payer: BC Managed Care – PPO | Admitting: Internal Medicine

## 2014-04-20 VITALS — BP 134/86 | Temp 98.4°F | Ht 73.0 in | Wt 215.0 lb

## 2014-04-20 DIAGNOSIS — I10 Essential (primary) hypertension: Secondary | ICD-10-CM

## 2014-04-20 DIAGNOSIS — E1165 Type 2 diabetes mellitus with hyperglycemia: Principal | ICD-10-CM

## 2014-04-20 DIAGNOSIS — IMO0001 Reserved for inherently not codable concepts without codable children: Secondary | ICD-10-CM

## 2014-04-20 DIAGNOSIS — E785 Hyperlipidemia, unspecified: Secondary | ICD-10-CM

## 2014-04-20 DIAGNOSIS — F17200 Nicotine dependence, unspecified, uncomplicated: Secondary | ICD-10-CM

## 2014-04-20 MED ORDER — METFORMIN HCL ER 500 MG PO TB24: 1000.0000 mg | ORAL_TABLET | Freq: Every day | ORAL | Status: DC

## 2014-04-20 NOTE — Patient Instructions (Addendum)
Take 2 metformin per day . Will change to extended release to avoid stomach side effect. Goal is hg a1c below 7.  Will get form done. Stop tobacco for health . Intensify lifestyle interventions. Avoid simple carbs     sweert beverages processed foods and juices  Fruit is better than juice to avoid sugar swings.

## 2014-04-20 NOTE — Progress Notes (Signed)
Pre visit review using our clinic review tool, if applicable. No additional management support is needed unless otherwise documented below in the visit note.  Chief Complaint  Patient presents with  . Follow-up    febrile illness  has disabiity form also   . Diabetes    HPI: Todd Rodriguez  comes in today for follow up of  multiple medical problems.  See last visit  Illness feels much better  Was out of work June 14 to June 26 ;works 12 hours shifts  Alternating  ( 80 hours per 2 weeks ) feels back to baseline BPmedication no change no se  LIPIDS active   Again  Help for the illness DM  ocass soda some  Juices  No fried foods  Only taking 500 of metformin  ROS: See pertinent positives and negatives per HPI. Works days  10 pm   No abd pian cv Pulm sx today   No numbness weakness vision change no etoh Tobacco 1ppd. chantix  And started back with  Uncle  No etoh   Past Medical History  Diagnosis Date  . Diabetes mellitus without complication   . Hypertension   . Pancreatitis   . GERD (gastroesophageal reflux disease)     Family History  Problem Relation Age of Onset  . Colon cancer Neg Hx   . Pancreatitis Neg Hx   . Pancreatic cancer Neg Hx   . Diabetes Mother   . Hypertension Mother   . Diabetes Father   . Hypertension Father   . Arthritis Maternal Grandmother   . Hypertension Maternal Grandfather   . Diabetes Maternal Grandfather     History   Social History  . Marital Status: Married    Spouse Name: N/A    Number of Children: 1  . Years of Education: N/A   Occupational History  .  [place] History Main Topics  . Smoking status: Current Every Day Smoker -- 1.00 packs/day  . Smokeless tobacco: None  . Alcohol Use: Yes     Comment: Social Drinker  . Drug Use: No  . Sexual Activity: Yes    Partners: Female     Comment: Patient's Wife   Other Topics Concern  . 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 atorvastatin (LIPITOR) 20 MG tablet Take 1 tablet (20 mg total) by mouth daily.  Marland Kitchen lisinopril-hydrochlorothiazide (PRINZIDE,ZESTORETIC) 20-25 MG per tablet Take 1 tablet by mouth daily.  . pantoprazole (PROTONIX) 40 MG tablet TAKE 1 TABLET BY MOUTH TWICE A DAY  . tadalafil (CIALIS) 20 MG tablet Take 20 mg by mouth daily as needed for erectile dysfunction.  . [DISCONTINUED] metFORMIN (GLUCOPHAGE) 500 MG tablet Take 500 mg by mouth 2 (two) times daily with a meal.  . metFORMIN (GLUCOPHAGE-XR) 500 MG 24 hr tablet Take 2 tablets (1,000 mg total) by mouth daily with breakfast.    EXAM:  BP 134/86  Temp(Src) 98.4 F (36.9 C) (Oral)  Ht 6' 1"  (1.854 m)  Wt 215 lb (97.523 kg)  BMI 28.37 kg/m2  Body mass index is 28.37 kg/(m^2).  GENERAL: vitals reviewed and listed above, alert, oriented, appears well hydrated and in no acute distress HEENT: atraumatic, conjunctiva  clear, no obvious abnormalities on inspection of external nose and ears NECK: no obvious masses on inspection palpation  LUNGS: clear to auscultation bilaterally, no wheezes, rales or rhonchi, good air movement CV: HRRR, no clubbing cyanosis or  peripheral edema nl cap refill  Abdomen:  Sof,t normal bowel sounds without hepatosplenomegaly, no guarding rebound or masses no CVA tenderness MS: moves all extremities without noticeable focal  abnormality PSYCH: pleasant and cooperative, no obvious depression or anxiety Lab Results  Component Value Date   WBC 7.4 03/26/2014   HGB 15.9 03/26/2014   HCT 46.8 03/26/2014   PLT 331.0 03/26/2014   GLUCOSE 177* 04/12/2014   CHOL 152 04/12/2014   TRIG 302.0* 04/12/2014   HDL 41.90 04/12/2014   LDLCALC 50 04/12/2014   ALT 29 03/26/2014    AST 28 03/26/2014   NA 139 04/12/2014   K 3.7 04/12/2014   CL 102 04/12/2014   CREATININE 1.2 04/12/2014   BUN 10 04/12/2014   CO2 29 04/12/2014   HGBA1C 7.3* 04/12/2014   BP Readings from Last 3 Encounters:  04/20/14 134/86  03/26/14 130/80  01/03/14 166/102    ASSESSMENT AND PLAN:  Discussed the following assessment and plan:  Type II or unspecified type diabetes mellitus without mention of complication, uncontrolled - intesify inc metformin only taking 500 poer day  change to xr inc to 1081m  Essential hypertension, benign  Other and unspecified hyperlipidemia - ldl at goal tg not  Tobacco dependence - counseled  Myalgia and myositis - much better from illness presumed infectious    has disability form  will complete  Continue lsi in addition to  Inc metformin  And advised tob cessation -Patient advised to return or notify health care team  if symptoms worsen ,persist or new concerns arise.  Patient Instructions  Take 2 metformin per day . Will change to extended release to avoid stomach side effect. Goal is hg a1c below 7.  Will get form done. Stop tobacco for health . Intensify lifestyle interventions. Avoid simple carbs     sweert beverages processed foods and juices  Fruit is better than juice to avoid sugar swings.      WStandley Brooking Greycen Felter M.D.  Total visit 282ms > 50% spent counseling and coordinating care  Also do form 4 pages was out June 14- 26

## 2014-04-21 ENCOUNTER — Telehealth: Payer: Self-pay | Admitting: Internal Medicine

## 2014-04-21 ENCOUNTER — Encounter: Payer: Self-pay | Admitting: Internal Medicine

## 2014-04-21 DIAGNOSIS — IMO0001 Reserved for inherently not codable concepts without codable children: Secondary | ICD-10-CM | POA: Insufficient documentation

## 2014-04-21 HISTORY — DX: Reserved for inherently not codable concepts without codable children: IMO0001

## 2014-04-21 NOTE — Telephone Encounter (Signed)
Relevant patient education assigned to patient using Emmi. ° °

## 2014-04-26 ENCOUNTER — Telehealth: Payer: Self-pay | Admitting: Gastroenterology

## 2014-04-26 NOTE — Telephone Encounter (Signed)
We never received records from [facility]. Need last MRI abd or CT abd ASAP.

## 2014-04-30 NOTE — Telephone Encounter (Signed)
Requested Records.

## 2014-06-13 ENCOUNTER — Encounter: Payer: Self-pay | Admitting: Internal Medicine

## 2014-06-13 NOTE — Telephone Encounter (Signed)
PATIENT SCHEDULED AND LETTER SENT  °

## 2014-06-13 NOTE — Telephone Encounter (Signed)
We never received any records.  Let's offer patient follow up OV for "abnormal pancreas/consider one last MRI."

## 2014-06-25 ENCOUNTER — Encounter: Payer: Self-pay | Admitting: Family Medicine

## 2014-06-25 ENCOUNTER — Ambulatory Visit (INDEPENDENT_AMBULATORY_CARE_PROVIDER_SITE_OTHER): Payer: BC Managed Care – PPO | Admitting: Family Medicine

## 2014-06-25 VITALS — BP 132/80 | HR 94 | Wt 218.0 lb

## 2014-06-25 DIAGNOSIS — R1013 Epigastric pain: Secondary | ICD-10-CM

## 2014-06-25 LAB — BASIC METABOLIC PANEL
BUN: 10 mg/dL (ref 6–23)
CO2: 25 mEq/L (ref 19–32)
Calcium: 9.7 mg/dL (ref 8.4–10.5)
Chloride: 95 mEq/L — ABNORMAL LOW (ref 96–112)
Creatinine, Ser: 1.1 mg/dL (ref 0.4–1.5)
GFR: 95.22 mL/min (ref >60.00)
GLUCOSE: 131 mg/dL — AB (ref 70–99)
POTASSIUM: 3.8 meq/L (ref 3.5–5.1)
Sodium: 136 mEq/L (ref 135–145)

## 2014-06-25 LAB — HEPATIC FUNCTION PANEL
ALT: 28 U/L (ref 0–53)
AST: 18 U/L (ref 0–37)
Albumin: 4.8 g/dL (ref 3.5–5.2)
Alkaline Phosphatase: 77 U/L (ref 39–117)
BILIRUBIN DIRECT: 0.1 mg/dL (ref 0.0–0.3)
BILIRUBIN TOTAL: 0.9 mg/dL (ref 0.2–1.2)
Total Protein: 9 g/dL — ABNORMAL HIGH (ref 6.0–8.3)

## 2014-06-25 LAB — CBC WITH DIFFERENTIAL/PLATELET
BASOS ABS: 0 10*3/uL (ref 0.0–0.1)
Basophils Relative: 0.2 % (ref 0.0–3.0)
Eosinophils Absolute: 0.1 10*3/uL (ref 0.0–0.7)
Eosinophils Relative: 0.8 % (ref 0.0–5.0)
HEMATOCRIT: 48.4 % (ref 39.0–52.0)
Hemoglobin: 16.5 g/dL (ref 13.0–17.0)
LYMPHS ABS: 1.2 10*3/uL (ref 0.7–4.0)
Lymphocytes Relative: 12 % (ref 12.0–46.0)
MCHC: 34 g/dL (ref 30.0–36.0)
MCV: 91.4 fl (ref 78.0–100.0)
MONOS PCT: 6.2 % (ref 3.0–12.0)
Monocytes Absolute: 0.6 10*3/uL (ref 0.1–1.0)
NEUTROS PCT: 80.8 % — AB (ref 43.0–77.0)
Neutro Abs: 8 10*3/uL — ABNORMAL HIGH (ref 1.4–7.7)
PLATELETS: 281 10*3/uL (ref 150.0–400.0)
RBC: 5.29 Mil/uL (ref 4.22–5.81)
RDW: 12.6 % (ref 11.5–15.5)
WBC: 10 10*3/uL (ref 4.0–10.5)

## 2014-06-25 LAB — LIPASE: LIPASE: 31 U/L (ref 11.0–59.0)

## 2014-06-25 NOTE — Patient Instructions (Signed)

## 2014-06-25 NOTE — Progress Notes (Signed)
Pre visit review using our clinic review tool, if applicable. No additional management support is needed unless otherwise documented below in the visit note. 

## 2014-06-25 NOTE — Progress Notes (Signed)
   Subjective:    Patient ID: Todd Rodriguez, male    DOB: 28-Mar-1976, 38 y.o.   MRN: [id number]  Abdominal Pain Pertinent negatives include no diarrhea, dysuria, fever, headaches, nausea or vomiting.   Patient seen with abdominal pain. Onset this past Friday. He has a bloated feeling and loss of appetite but no nausea or vomiting. He had relatively constant pain since then. Location is mostly epigastric and just left of center. Denies any nausea, vomiting, or diarrhea. No melena. No recent weight loss. Normal bowel movements. He describes a dull constant pain moderate severity. Prior history of acute pancreatitis and symptoms were slightly different with that. No history of peptic ulcer disease. Takes Protonix 40 mg regularly. Symptoms are nonexertional. He has scheduled followup with gastroenterology in October. He had CT abdomen and pelvis and abdominal ultrasound last winter.  Past Medical History  Diagnosis Date  . Diabetes mellitus without complication   . Hypertension   . Pancreatitis   . GERD (gastroesophageal reflux disease)    Past Surgical History  Procedure Laterality Date  . Spinal fusion    . Neck surgery      2 disc    reports that he has been smoking.  He does not have any smokeless tobacco history on file. He reports that he drinks alcohol. He reports that he does not use illicit drugs. family history includes Arthritis in his maternal grandmother; Diabetes in his father, maternal grandfather, and mother; Hypertension in his father, maternal grandfather, and mother. There is no history of Colon cancer, Pancreatitis, or Pancreatic cancer. No Known Allergies    Review of Systems  Constitutional: Positive for appetite change. Negative for fever, chills and unexpected weight change.  Respiratory: Negative for cough and shortness of breath.   Cardiovascular: Negative for chest pain.  Gastrointestinal: Positive for abdominal pain. Negative for nausea, vomiting, diarrhea and  blood in stool.  Genitourinary: Negative for dysuria.  Neurological: Negative for dizziness and headaches.       Objective:   Physical Exam  Constitutional: He appears well-developed and well-nourished. No distress.  Cardiovascular: Normal rate and regular rhythm.   Pulmonary/Chest: Effort normal and breath sounds normal. No respiratory distress. He has no wheezes. He has no rales.  Abdominal: Soft. Bowel sounds are normal. He exhibits no distension and no mass. There is no rebound and no guarding.  Minimally tender epigastric region. No guarding or rebound. No hepatomegaly or splenomegaly.  Musculoskeletal: He exhibits no edema.          Assessment & Plan:  Abdominal pain. Epigastric and left upper quadrant. Nonacute abdomen. He denies any red flags such as recent weight loss. Likelihood of peptic ulcer disease would seem low with chronic PPI use. Prior history of acute pancreatitis. Labs with CBC, basic metabolic panel, hepatic panel, lipase. He is scheduled followup with GI in October

## 2014-07-17 ENCOUNTER — Ambulatory Visit: Payer: BC Managed Care – PPO | Admitting: Gastroenterology

## 2014-08-15 ENCOUNTER — Telehealth: Payer: Self-pay | Admitting: Internal Medicine

## 2014-08-15 ENCOUNTER — Encounter: Payer: Self-pay | Admitting: Gastroenterology

## 2014-08-15 ENCOUNTER — Encounter: Payer: Self-pay | Admitting: Internal Medicine

## 2014-08-15 ENCOUNTER — Ambulatory Visit: Payer: BC Managed Care – PPO | Admitting: Gastroenterology

## 2014-08-15 NOTE — Telephone Encounter (Signed)
PATIENT WAS A NO SHOW/LETTER SENT

## 2014-08-21 ENCOUNTER — Other Ambulatory Visit: Payer: BC Managed Care – PPO

## 2014-09-03 ENCOUNTER — Encounter: Payer: BC Managed Care – PPO | Admitting: Internal Medicine

## 2014-09-03 NOTE — Progress Notes (Signed)
Document opened and reviewed for CPX but appt  canceled same day .

## 2015-01-01 ENCOUNTER — Ambulatory Visit (INDEPENDENT_AMBULATORY_CARE_PROVIDER_SITE_OTHER): Payer: BLUE CROSS/BLUE SHIELD | Admitting: Internal Medicine

## 2015-01-01 ENCOUNTER — Encounter: Payer: Self-pay | Admitting: Internal Medicine

## 2015-01-01 ENCOUNTER — Ambulatory Visit (INDEPENDENT_AMBULATORY_CARE_PROVIDER_SITE_OTHER)
Admission: RE | Admit: 2015-01-01 | Discharge: 2015-01-01 | Disposition: A | Discharge status: Home / Self Care | Payer: BLUE CROSS/BLUE SHIELD | Source: Ambulatory Visit | Attending: Internal Medicine | Admitting: Internal Medicine

## 2015-01-01 ENCOUNTER — Encounter: Payer: Self-pay | Admitting: Family Medicine

## 2015-01-01 VITALS — BP 140/100 | Temp 98.2°F | Ht 72.75 in | Wt 214.7 lb

## 2015-01-01 DIAGNOSIS — M898X1 Other specified disorders of bone, shoulder: Secondary | ICD-10-CM | POA: Diagnosis not present

## 2015-01-01 DIAGNOSIS — R05 Cough: Secondary | ICD-10-CM | POA: Diagnosis not present

## 2015-01-01 DIAGNOSIS — M545 Low back pain: Secondary | ICD-10-CM

## 2015-01-01 DIAGNOSIS — R112 Nausea with vomiting, unspecified: Secondary | ICD-10-CM

## 2015-01-01 DIAGNOSIS — E119 Type 2 diabetes mellitus without complications: Secondary | ICD-10-CM

## 2015-01-01 DIAGNOSIS — R197 Diarrhea, unspecified: Secondary | ICD-10-CM

## 2015-01-01 DIAGNOSIS — R531 Weakness: Secondary | ICD-10-CM | POA: Diagnosis not present

## 2015-01-01 DIAGNOSIS — R059 Cough, unspecified: Secondary | ICD-10-CM

## 2015-01-01 DIAGNOSIS — Z9889 Other specified postprocedural states: Secondary | ICD-10-CM

## 2015-01-01 LAB — CBC WITH DIFFERENTIAL/PLATELET
Basophils Absolute: 0 10*3/uL (ref 0.0–0.1)
Basophils Relative: 0.4 % (ref 0.0–3.0)
Eosinophils Absolute: 0.3 10*3/uL (ref 0.0–0.7)
Eosinophils Relative: 4.6 % (ref 0.0–5.0)
HEMATOCRIT: 44.3 % (ref 39.0–52.0)
HEMOGLOBIN: 15.1 g/dL (ref 13.0–17.0)
Lymphocytes Relative: 17.6 % (ref 12.0–46.0)
Lymphs Abs: 1.3 10*3/uL (ref 0.7–4.0)
MCHC: 34.1 g/dL (ref 30.0–36.0)
MCV: 88.6 fl (ref 78.0–100.0)
MONO ABS: 0.6 10*3/uL (ref 0.1–1.0)
Monocytes Relative: 8.3 % (ref 3.0–12.0)
NEUTROS PCT: 69.1 % (ref 43.0–77.0)
Neutro Abs: 5 10*3/uL (ref 1.4–7.7)
Platelets: 250 10*3/uL (ref 150.0–400.0)
RBC: 5 Mil/uL (ref 4.22–5.81)
RDW: 12.3 % (ref 11.5–15.5)
WBC: 7.2 10*3/uL (ref 4.0–10.5)

## 2015-01-01 LAB — POCT URINALYSIS DIPSTICK
Bilirubin, UA: NEGATIVE
GLUCOSE UA: NEGATIVE
KETONES UA: NEGATIVE
LEUKOCYTES UA: NEGATIVE
NITRITE UA: NEGATIVE
Protein, UA: NEGATIVE
SPEC GRAV UA: 1.015
Urobilinogen, UA: 0.2
pH, UA: 7

## 2015-01-01 LAB — BASIC METABOLIC PANEL
BUN: 11 mg/dL (ref 6–23)
CO2: 32 mEq/L (ref 19–32)
Calcium: 9.9 mg/dL (ref 8.4–10.5)
Chloride: 91 mEq/L — ABNORMAL LOW (ref 96–112)
Creatinine, Ser: 1.15 mg/dL (ref 0.40–1.50)
GFR: 90.23 mL/min (ref >60.00)
Glucose, Bld: 259 mg/dL — ABNORMAL HIGH (ref 70–99)
Potassium: 3.9 mEq/L (ref 3.5–5.1)
Sodium: 130 mEq/L — ABNORMAL LOW (ref 135–145)

## 2015-01-01 LAB — CK: Total CK: 184 U/L (ref 7–232)

## 2015-01-01 LAB — HEMOGLOBIN A1C: Hgb A1c MFr Bld: 9 % — ABNORMAL HIGH (ref 4.6–6.5)

## 2015-01-01 LAB — HEPATIC FUNCTION PANEL
ALBUMIN: 4.3 g/dL (ref 3.5–5.2)
ALK PHOS: 67 U/L (ref 39–117)
ALT: 14 U/L (ref 0–53)
AST: 14 U/L (ref 0–37)
BILIRUBIN TOTAL: 0.6 mg/dL (ref 0.2–1.2)
Bilirubin, Direct: 0.1 mg/dL (ref 0.0–0.3)
Total Protein: 8.3 g/dL (ref 6.0–8.3)

## 2015-01-01 LAB — LIPASE: LIPASE: 106 U/L — AB (ref 11.0–59.0)

## 2015-01-01 MED ORDER — ONDANSETRON 4 MG PO TBDP
4.0000 mg | ORAL_TABLET | Freq: Three times a day (TID) | ORAL | Status: DC | PRN
Start: 2015-01-01 — End: 2015-06-25

## 2015-01-01 NOTE — Patient Instructions (Signed)
This could be a bad stomach virus . Dehydration can cause the sx  Will notify you  of labs when available.  Push fluids ( without sugars)  No alcohol . Depending on labs we  Hold the metformin while sick .  Will send in med for nausea.  Will do referral to new NS about back  You will need to get records  Of prev  Surgeries etc.  Plan fu depending on results

## 2015-01-01 NOTE — Progress Notes (Signed)
Pre visit review using our clinic review tool, if applicable. No additional management support is needed unless otherwise documented below in the visit note.   Chief Complaint  Patient presents with  . Shoulder Pain    X2wks  . Nausea    vomingn diarrhe illness feels weak    HPI: Patient Todd Rodriguez  comes in today for SDA for  new problem evaluation. Had stomach pain Friday 4 days ago.   at party had vomiting adn diarrhea  Some better next day  Now loose stool stopped yesterday but still very nauseaous   abd discomfort    Feels weak  All over no fever . Has periscapular pain and ocass cough  ? If shoulder hurts with lifting  No injury pers e.   WN:UUVO check weeks. 99 No vomiting   Last day diarrhea  2 days ago  Took pepto  Pain lower  Chest abd  also had right shoulder blade  Then other side  When dad was in hospital  Dx chf  .   [place]. At home.  One drink vodka  2 days ago . friends came over  Still some tobacco . 1ppd   Says some of pain seems like his back pain [place]  [place] needs to establish with new doc if having sx again   ROS: See pertinent positives and negatives per HPI. No cp sob sweating bleeding  Past Medical History  Diagnosis Date  . Diabetes mellitus without complication   . Hypertension   . Pancreatitis   . GERD (gastroesophageal reflux disease)     Family History  Problem Relation Age of Onset  . Colon cancer Neg Hx   . Pancreatitis Neg Hx   . Pancreatic cancer Neg Hx   . Diabetes Mother   . Hypertension Mother   . Diabetes Father   . Hypertension Father   . Arthritis Maternal Grandmother   . Hypertension Maternal Grandfather   . Diabetes Maternal Grandfather     History   Social History  . Marital Status: Married    Spouse Name: N/A  . Number of Children: 1  . Years of Education: N/A   Occupational History  .  [employer] History Main Topics  . Smoking status: Current Every Day Smoker  -- 1.00 packs/day  . Smokeless tobacco: Not on file  . Alcohol Use: Yes     Comment: Social Drinker  . Drug Use: No  . Sexual Activity:    Partners: Female     Comment: Patient's Wife   Other Topics Concern  . 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 atorvastatin (LIPITOR) 20 MG tablet Take 1 tablet (20 mg total) by mouth daily.  Marland Kitchen lisinopril-hydrochlorothiazide (PRINZIDE,ZESTORETIC) 20-25 MG per tablet Take 1 tablet by mouth daily.  . metFORMIN (GLUCOPHAGE-XR) 500 MG 24 hr tablet Take 2 tablets (1,000 mg total) by mouth daily with breakfast.  . pantoprazole (PROTONIX) 40 MG tablet TAKE 1 TABLET BY MOUTH TWICE A DAY  .  tadalafil (CIALIS) 20 MG tablet Take 20 mg by mouth daily as needed for erectile dysfunction.  . ondansetron (ZOFRAN-ODT) 4 MG disintegrating tablet Take 1 tablet (4 mg total) by mouth every 8 (eight) hours as needed for nausea or vomiting.    EXAM:  BP 140/100 mmHg  Temp(Src) 98.2 F (36.8 C) (Oral)  Ht 6' 0.75" (1.848 m)  Wt 214 lb 11.2 oz (97.387 kg)  BMI 28.52 kg/m2  Body mass index is 28.52 kg/(m^2).  GENERAL: vitals reviewed and listed above, alert, oriented, appears well hydrated and in no acute distress mild ill non toxic non icteric  HEENT: atraumatic, conjunctiva  clear, no obvious abnormalities on inspection of external nose and ears tms clear OP : no lesion edema or exudate  NECK: no obvious masses on inspection palpation  LUNGS: clear to auscultation bilaterally, no wheezes, rales or rhonchi, good air movement CV: HRRR, no clubbing cyanosis or  peripheral edema nl cap refill  Abdomen:  Sof,t normal to dec  bowel sounds without hepatosplenomegaly, no  guarding rebound or masses no CVA tenderness MS: moves all extremities without noticeable focal  Abnormality moves shoulder some pullin sensation upper body discomfort  PSYCH: pleasant and cooperative, cognition intact   ASSESSMENT AND PLAN:  Discussed the following assessment and plan:  Nausea vomiting and diarrhea - Plan: Basic metabolic panel, CBC with Differential/Platelet, Hemoglobin A1c, Hepatic function panel, Lipase, POCT urinalysis dipstick, CK  Weakness generalized - Plan: Basic metabolic panel, CBC with Differential/Platelet, Hemoglobin A1c, Hepatic function panel, Lipase, POCT urinalysis dipstick, CK  Periscapular pain - Plan: Basic metabolic panel, CBC with Differential/Platelet, Hemoglobin A1c, Hepatic function panel, Lipase, POCT urinalysis dipstick, CK, DG Chest 2 View  Diabetes mellitus without complication - Plan: Basic metabolic panel, CBC with Differential/Platelet, Hemoglobin A1c, Hepatic function panel, Lipase, POCT urinalysis dipstick, CK  Cough - Plan: DG Chest 2 View  History of back surgery - Plan: Ambulatory referral to Neurosurgery  Midline low back pain, with sciatica presence unspecified - Plan: Ambulatory referral to Neurosurgery -Patient advised to return or notify health care team  if symptoms worsen ,persist or new concerns arise.     Patient Instructions  This could be a bad stomach virus . Dehydration can cause the sx  Will notify you  of labs when available.  Push fluids ( without sugars)  No alcohol . Depending on labs we  Hold the metformin while sick .  Will send in med for nausea.  Will do referral to new NS about back  You will need to get records  Of prev  Surgeries etc.  Plan fu depending on results     Standley Brooking. Panosh M.D.

## 2015-01-17 ENCOUNTER — Other Ambulatory Visit (INDEPENDENT_AMBULATORY_CARE_PROVIDER_SITE_OTHER): Payer: BLUE CROSS/BLUE SHIELD

## 2015-01-17 DIAGNOSIS — R7989 Other specified abnormal findings of blood chemistry: Secondary | ICD-10-CM | POA: Diagnosis not present

## 2015-01-17 DIAGNOSIS — Z0000 Encounter for general adult medical examination without abnormal findings: Secondary | ICD-10-CM | POA: Diagnosis not present

## 2015-01-17 LAB — LIPID PANEL
CHOL/HDL RATIO: 6
Cholesterol: 199 mg/dL (ref 0–200)
HDL: 34.2 mg/dL — ABNORMAL LOW (ref >39.00)
NONHDL: 164.8
Triglycerides: 379 mg/dL — ABNORMAL HIGH (ref 0.0–149.0)
VLDL: 75.8 mg/dL — AB (ref 0.0–40.0)

## 2015-01-17 LAB — COMPREHENSIVE METABOLIC PANEL
ALBUMIN: 4.3 g/dL (ref 3.5–5.2)
ALK PHOS: 73 U/L (ref 39–117)
ALT: 20 U/L (ref 0–53)
AST: 14 U/L (ref 0–37)
BILIRUBIN TOTAL: 0.9 mg/dL (ref 0.2–1.2)
BUN: 14 mg/dL (ref 6–23)
CO2: 30 mEq/L (ref 19–32)
Calcium: 10.2 mg/dL (ref 8.4–10.5)
Chloride: 98 mEq/L (ref 96–112)
Creatinine, Ser: 1.15 mg/dL (ref 0.40–1.50)
GFR: 90.21 mL/min (ref >60.00)
Glucose, Bld: 245 mg/dL — ABNORMAL HIGH (ref 70–99)
Potassium: 4.3 mEq/L (ref 3.5–5.1)
SODIUM: 134 meq/L — AB (ref 135–145)
TOTAL PROTEIN: 7.2 g/dL (ref 6.0–8.3)

## 2015-01-17 LAB — CBC WITH DIFFERENTIAL/PLATELET
BASOS ABS: 0 10*3/uL (ref 0.0–0.1)
Basophils Relative: 0.5 % (ref 0.0–3.0)
EOS ABS: 0.3 10*3/uL (ref 0.0–0.7)
Eosinophils Relative: 3.5 % (ref 0.0–5.0)
HCT: 42.3 % (ref 39.0–52.0)
Hemoglobin: 14.8 g/dL (ref 13.0–17.0)
Lymphocytes Relative: 23.8 % (ref 12.0–46.0)
Lymphs Abs: 1.7 10*3/uL (ref 0.7–4.0)
MCHC: 34.9 g/dL (ref 30.0–36.0)
MCV: 87.9 fl (ref 78.0–100.0)
Monocytes Absolute: 0.5 10*3/uL (ref 0.1–1.0)
Monocytes Relative: 7.1 % (ref 3.0–12.0)
Neutro Abs: 4.8 10*3/uL (ref 1.4–7.7)
Neutrophils Relative %: 65.1 % (ref 43.0–77.0)
Platelets: 299 10*3/uL (ref 150.0–400.0)
RBC: 4.81 Mil/uL (ref 4.22–5.81)
RDW: 12.3 % (ref 11.5–15.5)
WBC: 7.3 10*3/uL (ref 4.0–10.5)

## 2015-01-17 LAB — MICROALBUMIN / CREATININE URINE RATIO
Creatinine,U: 133.3 mg/dL
Microalb Creat Ratio: 0.5 mg/g (ref 0.0–30.0)
Microalb, Ur: <0.7 mg/dL (ref 0.0–1.9)

## 2015-01-17 LAB — TSH: TSH: 1.03 u[IU]/mL (ref 0.35–4.50)

## 2015-01-17 LAB — PSA: PSA: 0.82 ng/mL (ref 0.10–4.00)

## 2015-01-17 LAB — LDL CHOLESTEROL, DIRECT: Direct LDL: 112 mg/dL

## 2015-01-17 LAB — HEMOGLOBIN A1C: Hgb A1c MFr Bld: 9.9 % — ABNORMAL HIGH (ref 4.6–6.5)

## 2015-01-18 ENCOUNTER — Other Ambulatory Visit: Payer: Self-pay | Admitting: Internal Medicine

## 2015-01-18 NOTE — Telephone Encounter (Signed)
Sent to the pharmacy by e-scribe.  Pt has appt on 01/25/15

## 2015-01-25 ENCOUNTER — Ambulatory Visit (INDEPENDENT_AMBULATORY_CARE_PROVIDER_SITE_OTHER): Payer: BLUE CROSS/BLUE SHIELD | Admitting: Internal Medicine

## 2015-01-25 ENCOUNTER — Encounter: Payer: Self-pay | Admitting: Internal Medicine

## 2015-01-25 VITALS — BP 120/86 | Temp 98.4°F | Ht 73.0 in | Wt 216.0 lb

## 2015-01-25 DIAGNOSIS — R252 Cramp and spasm: Secondary | ICD-10-CM

## 2015-01-25 DIAGNOSIS — I10 Essential (primary) hypertension: Secondary | ICD-10-CM

## 2015-01-25 DIAGNOSIS — E1165 Type 2 diabetes mellitus with hyperglycemia: Secondary | ICD-10-CM

## 2015-01-25 DIAGNOSIS — F17200 Nicotine dependence, unspecified, uncomplicated: Secondary | ICD-10-CM

## 2015-01-25 DIAGNOSIS — E785 Hyperlipidemia, unspecified: Secondary | ICD-10-CM | POA: Diagnosis not present

## 2015-01-25 DIAGNOSIS — Z8719 Personal history of other diseases of the digestive system: Secondary | ICD-10-CM | POA: Diagnosis not present

## 2015-01-25 DIAGNOSIS — Z0000 Encounter for general adult medical examination without abnormal findings: Secondary | ICD-10-CM | POA: Diagnosis not present

## 2015-01-25 DIAGNOSIS — IMO0001 Reserved for inherently not codable concepts without codable children: Secondary | ICD-10-CM

## 2015-01-25 MED ORDER — METFORMIN HCL ER 500 MG PO TB24: 1000.0000 mg | ORAL_TABLET | Freq: Two times a day (BID) | ORAL | Status: DC

## 2015-01-25 MED ORDER — EMPAGLIFLOZIN 10 MG PO TABS: 10.0000 mg | ORAL_TABLET | Freq: Every day | ORAL | Status: DC

## 2015-01-25 NOTE — Progress Notes (Signed)
Pre visit review using our clinic review tool, if applicable. No additional management support is needed unless otherwise documented below in the visit note.  Chief Complaint  Patient presents with  . Annual Exam    HPI: Patient  Todd Rodriguez  39 y.o. comes in today for Preventive Health Care visit  Chronic disease management Gi illness better  . Left ihand dtongling ssation finger  sometims at sleep.  DM checks when not at worlk  3rd shift 12 hours   In high 100s 200 range  No lows on 1000 mg metformin per day. No se noted "dont eat sweets prob may be bread. " water a nd 2 gatorade per day ( hot at work physical )  In past few weeks cutting out fird and breads  More fruits and veggesBP med no se noted  Lipid med taking daily  Taking protonix helps  Once a day  Ins not pay for bid  Still tobacco decreasing Over due for eye exam  Health Maintenance  Topic Date Due  . FOOT EXAM  03/22/1984  . OPHTHALMOLOGY EXAM  03/22/1984  . PNEUMOCOCCAL POLYSACCHARIDE VACCINE (1) 01/06/2016 (Originally 03/22/1976)  . HIV Screening  01/06/2016 (Originally 03/22/1989)  . INFLUENZA VACCINE  05/06/2015  . HEMOGLOBIN A1C  07/19/2015  . URINE MICROALBUMIN  01/17/2016  . TETANUS/TDAP  11/05/2018   Health Maintenance Review LIFESTYLE:  Exercise:  Yard work  Tobacco/ETS:  Working on int still smoking 1/2 ppd  Alcohol:   Not now  Sugar beverages: ocass gatorade . 2 per day .   Sleep: hours  3rd shift .   Off . No osa  Drug use: no  ROS:  GEN/ HEENT: No fever, significant weight changes sweats headaches vision problems hearing changes, CV/ PULM; No chest pain shortness of breath cough, syncope,edema  change in exercise tolerance. GI /GU: No adominal pain, vomiting, change in bowel habits. No blood in the stool. No significant GU symptoms. SKIN/HEME: ,no acute skin rashes suspicious lesions or bleeding. No lymphadenopathy, nodules, masses.  NEURO/ PSYCH:  No neurologic signs such as weakness numbness.  No depression anxiety. IMM/ Allergy: No unusual infections.  Allergy .   REST of 12 system review negative except as per HPI   Past Medical History  Diagnosis Date  . Diabetes mellitus without complication   . Hypertension   . Pancreatitis   . GERD (gastroesophageal reflux disease)     Past Surgical History  Procedure Laterality Date  . Spinal fusion    . Neck surgery      2 disc    Family History  Problem Relation Age of Onset  . Colon cancer Neg Hx   . Pancreatitis Neg Hx   . Pancreatic cancer Neg Hx   . Diabetes Mother   . Hypertension Mother   . Diabetes Father   . Hypertension Father   . Arthritis Maternal Grandmother   . Hypertension Maternal Grandfather   . Diabetes Maternal Grandfather     History   Social History  . Marital Status: Married    Spouse Name: N/A  . Number of Children: 1  . Years of Education: N/A   Occupational History  .  [place] History Main Topics  . Smoking status: Current Every Day Smoker -- 1.00 packs/day  . Smokeless tobacco: Not on file  . Alcohol Use: Yes     Comment: Social Drinker  . Drug Use: No  . Sexual Activity:    Partners: Female  Comment: Patient's Wife   Other Topics Concern  . 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 atorvastatin (LIPITOR) 20 MG tablet TAKE 1 TABLET BY MOUTH DAILY  . lisinopril-hydrochlorothiazide (PRINZIDE,ZESTORETIC) 20-25 MG per tablet TAKE 1 TABLET BY MOUTH EVERY DAY  . metFORMIN (GLUCOPHAGE-XR) 500 MG 24 hr tablet Take 2 tablets (1,000 mg total) by mouth 2 (two) times daily.  . ondansetron (ZOFRAN-ODT) 4 MG disintegrating tablet Take 1  tablet (4 mg total) by mouth every 8 (eight) hours as needed for nausea or vomiting.  . pantoprazole (PROTONIX) 40 MG tablet TAKE 1 TABLET BY MOUTH TWICE A DAY  . tadalafil (CIALIS) 20 MG tablet Take 20 mg by mouth daily as needed for erectile dysfunction.  . [DISCONTINUED] metFORMIN (GLUCOPHAGE-XR) 500 MG 24 hr tablet Take 2 tablets (1,000 mg total) by mouth daily with breakfast.  . empagliflozin (JARDIANCE) 10 MG TABS tablet Take 10 mg by mouth daily.    EXAM:  BP 120/86 mmHg  Temp(Src) 98.4 F (36.9 C) (Oral)  Ht 6' 1"  (1.854 m)  Wt 216 lb (97.977 kg)  BMI 28.50 kg/m2  Body mass index is 28.5 kg/(m^2).  Physical Exam: Vital signs reviewed ZES:PQZR is a well-developed well-nourished alert cooperative    who appearsr stated age in no acute distress.  HEENT: normocephalic atraumatic , Eyes: PERRL EOM's full, conjunctiva pterigium , Nares: paten,t no deformity discharge or tenderness., Ears: no deformity EAC's clear TMs with normal landmarks. Mouth: clear OP, no lesions, edema.  Moist mucous membranes. Dentition in adequate repair. NECK: supple without masses, thyromegaly or bruits. CHEST/PULM:  Clear to auscultation and percussion breath sounds equal no wheeze , rales or rhonchi. No chest wall deformities or tenderness. CV: PMI is nondisplaced, S1 S2 no gallops, murmurs, rubs. Peripheral pulses are full without delay.No JVD .  ABDOMEN: Bowel sounds normal nontender  No guard or rebound, no hepato splenomegal no CVA tenderness.  No hernia. Extremtities:  No clubbing cyanosis or edema, no acute joint swelling or redness no focal atrophy NEURO:  Oriented x3, cranial nerves 3-12 appear to be intact, no obvious focal weakness,gait within normal limits no abnormal reflexes or asymmetrical SKIN: No acute rashes normal turgor, color, no bruising or petechiae. PSYCH: Oriented, good eye contact, no obvious depression anxiety, cognition and judgment appear normal. LN: no cervical axillary inguinal  adenopathy  Diabetic Foot Exam - Simple   Simple Foot Form  Diabetic Foot exam was performed with the following findings:  Yes 01/25/2015 12:15 PM  Visual Inspection  No deformities, no ulcerations, no other skin breakdown bilaterally:  Yes  Sensation Testing  Intact to touch and monofilament testing bilaterally:  Yes  Pulse Check  Posterior Tibialis and Dorsalis pulse intact bilaterally:  Yes  Comments       Lab Results  Component Value Date   WBC 7.3 01/17/2015   HGB 14.8 01/17/2015   HCT 42.3 01/17/2015   PLT 299.0 01/17/2015   GLUCOSE 245* 01/17/2015   CHOL 199  01/17/2015   TRIG 379.0* 01/17/2015   HDL 34.20* 01/17/2015   LDLDIRECT 112.0 01/17/2015   LDLCALC 50 04/12/2014   ALT 20 01/17/2015   AST 14 01/17/2015   NA 134* 01/17/2015   K 4.3 01/17/2015   CL 98 01/17/2015   CREATININE 1.15 01/17/2015   BUN 14 01/17/2015   CO2 30 01/17/2015   TSH 1.03 01/17/2015   PSA 0.82 01/17/2015   HGBA1C 9.9* 01/17/2015   MICROALBUR <0.7 01/17/2015   BP Readings from Last 3 Encounters:  01/25/15 120/86  01/01/15 140/100  06/25/14 132/80    Wt Readings from Last 3 Encounters:  01/25/15 216 lb (97.977 kg)  01/01/15 214 lb 11.2 oz (97.387 kg)  06/25/14 218 lb (98.884 kg)      ASSESSMENT AND PLAN:  Discussed the following assessment and plan:  Visit for preventive health examination  Diabetes mellitus type 2, uncontrolled, without complications - Plan: Ambulatory referral to diabetic education, Amb ref to Medical Nutrition Therapy-MNT  Essential hypertension, benign - controlled   Hyperlipidemia - Plan: Amb ref to Medical Nutrition Therapy-MNT  Hx of acute pancreatitis - x 2   Hand cramps - playing drums  ? if metabolic vs  cts like  get sugar iunder control  consider other eval  Tobacco dependence - disc  stopping contact us  Plan cbg bid and rov in 1 month or go to endo  Max out metfomin  Increase after 3-7 days as tolerated and add   Currently avoiding    glp1 whx of pancreatitis  Vs insulin other considier inc statin but wait until dm undercontrol  Disc dietary referral Patient Care Team: Burnis Medin, MD as PCP - General (Internal Medicine) Mahala Menghini, PA-C as Physician Assistant (Gastroenterology) Patient Instructions  Diabetes is  Now out of control . nurtition  Dietary referral as discussed  Get eye exam .  Increase metformin to 4 pills per day 2000 mg . Avoid dehydration.  After  Increasing the metformin  Begin the jardiance once a day This medication can increase urination  Make sure dont get dehydrated . Is assosicated with weight loss and better  Control.   May need to begin insulin if not successful with this regiem  Can get endorinology consult as needed  Check   Glucose levels   tiwce a day during transition.  Goal less thant 120 fasting  Less than 150 - 180 after eating  Evenetually. ROV in   6 weeks  With BG results  To see if on the right track     [place]. Alfhild Partch M.D.

## 2015-01-25 NOTE — Patient Instructions (Signed)
Diabetes is  Now out of control . nurtition  Dietary referral as discussed  Get eye exam .  Increase metformin to 4 pills per day 2000 mg . Avoid dehydration.  After  Increasing the metformin  Begin the jardiance once a day This medication can increase urination  Make sure dont get dehydrated . Is assosicated with weight loss and better  Control.   May need to begin insulin if not successful with this regiem  Can get endorinology consult as needed  Check   Glucose levels   tiwce a day during transition.  Goal less thant 120 fasting  Less than 150 - 180 after eating  Evenetually. ROV in   6 weeks  With BG results  To see if on the right track

## 2015-02-19 ENCOUNTER — Telehealth: Payer: Self-pay | Admitting: Internal Medicine

## 2015-02-19 MED ORDER — METFORMIN HCL ER 500 MG PO TB24: 1000.0000 mg | ORAL_TABLET | Freq: Two times a day (BID) | ORAL | Status: DC

## 2015-02-19 MED ORDER — LISINOPRIL-HYDROCHLOROTHIAZIDE 20-25 MG PO TABS: 1.0000 | ORAL_TABLET | Freq: Every day | ORAL | Status: DC

## 2015-02-19 MED ORDER — ATORVASTATIN CALCIUM 20 MG PO TABS: 20.0000 mg | ORAL_TABLET | Freq: Every day | ORAL | Status: DC

## 2015-02-19 MED ORDER — PANTOPRAZOLE SODIUM 40 MG PO TBEC: 40.0000 mg | DELAYED_RELEASE_TABLET | Freq: Two times a day (BID) | ORAL | Status: DC

## 2015-02-19 NOTE — Telephone Encounter (Signed)
Todd Rodriguez is needing 4 prescriptions renewed there are 0 refills for: atorvastatin (LIPITOR) 20 MG tablet, lisinopril-hydrochlorothiazide (PRINZIDE,ZESTORETIC) 20-25 MG per tablet, metFORMIN (GLUCOPHAGE-XR) 500 MG 24 hr tablet, and  pantoprazole (PROTONIX) 40 MG tablet sent to [facility]  [address] At [place].

## 2015-02-19 NOTE — Telephone Encounter (Signed)
Rx sent to pharmacy.  Called and left pt a vm making aware.

## 2015-02-28 ENCOUNTER — Ambulatory Visit: Payer: BLUE CROSS/BLUE SHIELD | Admitting: *Deleted

## 2015-03-08 ENCOUNTER — Ambulatory Visit: Payer: BLUE CROSS/BLUE SHIELD | Admitting: Internal Medicine

## 2015-03-14 ENCOUNTER — Other Ambulatory Visit: Payer: Self-pay | Admitting: Internal Medicine

## 2015-03-14 NOTE — Telephone Encounter (Signed)
Denied.  Filled on 02/19/15 for six months.  Request is too early.

## 2015-03-21 ENCOUNTER — Other Ambulatory Visit: Payer: Self-pay | Admitting: Internal Medicine

## 2015-03-21 NOTE — Telephone Encounter (Signed)
Denied.  Filled on  02/18/15 for six months

## 2015-05-21 NOTE — Progress Notes (Signed)
REVIEWED-NO ADDITIONAL RECOMMENDATIONS. 

## 2015-05-25 ENCOUNTER — Other Ambulatory Visit: Payer: Self-pay | Admitting: Internal Medicine

## 2015-05-28 NOTE — Telephone Encounter (Signed)
Sent to the pharmacy by e-scribe. 

## 2015-06-25 ENCOUNTER — Encounter: Payer: Self-pay | Admitting: Internal Medicine

## 2015-06-25 ENCOUNTER — Ambulatory Visit (INDEPENDENT_AMBULATORY_CARE_PROVIDER_SITE_OTHER): Payer: BLUE CROSS/BLUE SHIELD | Admitting: Internal Medicine

## 2015-06-25 VITALS — BP 112/72 | Temp 98.4°F | Wt 212.5 lb

## 2015-06-25 DIAGNOSIS — E1165 Type 2 diabetes mellitus with hyperglycemia: Secondary | ICD-10-CM

## 2015-06-25 DIAGNOSIS — IMO0001 Reserved for inherently not codable concepts without codable children: Secondary | ICD-10-CM | POA: Insufficient documentation

## 2015-06-25 DIAGNOSIS — R252 Cramp and spasm: Secondary | ICD-10-CM

## 2015-06-25 DIAGNOSIS — N529 Male erectile dysfunction, unspecified: Secondary | ICD-10-CM

## 2015-06-25 DIAGNOSIS — F17200 Nicotine dependence, unspecified, uncomplicated: Secondary | ICD-10-CM

## 2015-06-25 DIAGNOSIS — I10 Essential (primary) hypertension: Secondary | ICD-10-CM

## 2015-06-25 LAB — BASIC METABOLIC PANEL
BUN: 21 mg/dL (ref 6–23)
CALCIUM: 10.7 mg/dL — AB (ref 8.4–10.5)
CO2: 31 meq/L (ref 19–32)
CREATININE: 1.52 mg/dL — AB (ref 0.40–1.50)
Chloride: 92 mEq/L — ABNORMAL LOW (ref 96–112)
GFR: 65.24 mL/min (ref >60.00)
GLUCOSE: 179 mg/dL — AB (ref 70–99)
Potassium: 4.5 mEq/L (ref 3.5–5.1)
Sodium: 133 mEq/L — ABNORMAL LOW (ref 135–145)

## 2015-06-25 LAB — HEMOGLOBIN A1C: Hgb A1c MFr Bld: 7.7 % — ABNORMAL HIGH (ref 4.6–6.5)

## 2015-06-25 LAB — CK: Total CK: 489 U/L — ABNORMAL HIGH (ref 7–232)

## 2015-06-25 LAB — MAGNESIUM: Magnesium: 2.4 mg/dL (ref 1.5–2.5)

## 2015-06-25 MED ORDER — ATORVASTATIN CALCIUM 20 MG PO TABS: 20.0000 mg | ORAL_TABLET | Freq: Every day | ORAL | Status: DC

## 2015-06-25 MED ORDER — LISINOPRIL 20 MG PO TABS: 20.0000 mg | ORAL_TABLET | Freq: Every day | ORAL | Status: DC

## 2015-06-25 MED ORDER — METFORMIN HCL ER 500 MG PO TB24: 1000.0000 mg | ORAL_TABLET | Freq: Two times a day (BID) | ORAL | Status: DC

## 2015-06-25 MED ORDER — VARENICLINE TARTRATE 0.5 MG X 11 & 1 MG X 42 PO MISC: ORAL | Status: DC

## 2015-06-25 MED ORDER — TADALAFIL 20 MG PO TABS: 20.0000 mg | ORAL_TABLET | Freq: Every day | ORAL | Status: DC | PRN

## 2015-06-25 MED ORDER — EMPAGLIFLOZIN 10 MG PO TABS: 10.0000 mg | ORAL_TABLET | Freq: Every day | ORAL | Status: DC

## 2015-06-25 MED ORDER — GLUCOSE BLOOD VI STRP: ORAL_STRIP | Status: DC

## 2015-06-25 MED ORDER — PANTOPRAZOLE SODIUM 40 MG PO TBEC: 40.0000 mg | DELAYED_RELEASE_TABLET | Freq: Two times a day (BID) | ORAL | Status: DC

## 2015-06-25 NOTE — Progress Notes (Signed)
Pre visit review using our clinic review tool, if applicable. No additional management support is needed unless otherwise documented below in the visit note.  Chief Complaint  Patient presents with  . Follow-up    HPI: Todd Rodriguez 39 y.o. and Chronic disease management  DM  bg 180 and  170   Fasting  And not before .    Juice   Slowed down. No sweet beverages   Water.   Concerned about arm and leg cramps sometimes at night in his arms when he gets dehydrated and overheated and exercising he gets leg cramps. Started taking over-the-counter potassium recently.  Complaint of sexual dysfunction wife complained to Cialis seems to work decreased libido and erectile dysfunction. No alcohol continues to smoke a half pack per day did stop in the remote past and then started back under stress. Interested in stopping again asked about nicotine patches and Chantix.  Blood pressure has been very well-controlled taking medicines every day reviewed. Says that he gets eye exam every year no problems. Denies any numbness in his hands or feet except residual and 2 fingers in his left hand from his back neck predicament.  ROS: See pertinent positives and negatives per HPI.  Past Medical History  Diagnosis Date  . Diabetes mellitus without complication   . Hypertension   . Pancreatitis   . GERD (gastroesophageal reflux disease)     Family History  Problem Relation Age of Onset  . Colon cancer Neg Hx   . Pancreatitis Neg Hx   . Pancreatic cancer Neg Hx   . Diabetes Mother   . Hypertension Mother   . Diabetes Father   . Hypertension Father   . Arthritis Maternal Grandmother   . Hypertension Maternal Grandfather   . Diabetes Maternal Grandfather     Social History   Social History  . Marital Status: Married    Spouse Name: N/A  . Number of Children: 1  . Years of Education: N/A   Occupational History  .  [place] History Main Topics  . Smoking status: Current  Every Day Smoker -- 1.00 packs/day  . Smokeless tobacco: None  . Alcohol Use: Yes     Comment: Social Drinker  . Drug Use: No  . Sexual Activity:    Partners: Female     Comment: Patient's Wife   Other Topics Concern  . 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 lisinopril-hydrochlorothiazide (PRINZIDE,ZESTORETIC) 20-25 MG per tablet Take 1 tablet by mouth daily. 90 tablet 1  . atorvastatin (LIPITOR) 20 MG tablet Take 1 tablet (20 mg total) by mouth daily. 90 tablet 1  . empagliflozin (JARDIANCE) 10 MG TABS tablet Take 10 mg by mouth daily. 30 tablet 12  . metFORMIN (GLUCOPHAGE-XR) 500 MG 24 hr tablet Take 2 tablets (1,000 mg total) by mouth 2 (two) times daily. 360 tablet 1  . pantoprazole (PROTONIX) 40 MG tablet Take 1 tablet (40 mg total) by mouth 2 (two) times  daily. 180 tablet 1  . pantoprazole (PROTONIX) 40 MG tablet TAKE 1 TABLET BY MOUTH TWICE A DAY 180 tablet 1  . tadalafil (CIALIS) 20 MG tablet Take 20 mg by mouth daily as needed for erectile dysfunction.    . ondansetron (ZOFRAN-ODT) 4 MG disintegrating tablet Take 1 tablet (4 mg total) by mouth every 8 (eight) hours as needed for nausea or vomiting. 20 tablet 0   No facility-administered medications prior to visit.     EXAM:  BP 112/72 mmHg  Temp(Src) 98.4 F (36.9 C) (Oral)  Wt 212 lb 8 oz (96.389 kg)  Body mass index is 28.04 kg/(m^2).  GENERAL: vitals reviewed and listed above, alert, oriented, appears well hydrated and in no acute distress HEENT: atraumatic,  NECK: no obvious masses on inspection palpation  LUNGS: clear to auscultation bilaterally, no wheezes, rales or rhonchi,  good air movement CV: HRRR, no clubbing cyanosis or  peripheral edema nl cap refill  Abdomen:  Sof,t normal bowel sounds without hepatosplenomegaly, no guarding rebound or masses no CVA tenderness MS: moves all extremities without noticeable focal  Abnormality no obv atrophy  PSYCH: pleasant and cooperative, no obvious depression or anxiety   ASSESSMENT AND PLAN:  Discussed the following assessment and plan:  Cramps, extremity - upper at night lower poss hydration check mg K trial off hctz - Plan: Basic metabolic panel, Hemoglobin A1c, Magnesium, CK  Diabetes mellitus type 2, uncontrolled, without complications - gets yearly eyeexam / control check today - Plan: Basic metabolic panel, Hemoglobin A1c, Magnesium, CK  Tobacco dependence - Plan: Basic metabolic panel, Hemoglobin A1c, Magnesium, CK  Essential hypertension, benign - Plan: Basic metabolic panel, Hemoglobin A1c, Magnesium, CK  Erectile dysfunction, unspecified erectile dysfunction type - rx cialis optimize cv risk factors  chantix sent in  To pharmacy  Plan fu appt in 2-3 months or as needed Declined pneumovax today but may  Get in future -Patient advised to return or notify health care team  if symptoms worsen ,persist or new concerns arise.  Patient Instructions  Will notify you  of labs when available. Change  bp medication to  Lisinopril without the  Diuretic incase causing the cramps. Dehydration can also  cause a problem . Tobacco is a risk factor for sexual dysfunction.   If Bg not controlled may consider other  Interventions  Or daibetes specialist.  Need eye exam  For the diabetes if not done Due for pneumonia vaccines .    Standley Brooking. Panosh M.D.

## 2015-06-25 NOTE — Patient Instructions (Signed)
Will notify you  of labs when available. Change  bp medication to  Lisinopril without the  Diuretic incase causing the cramps. Dehydration can also  cause a problem . Tobacco is a risk factor for sexual dysfunction.   If Bg not controlled may consider other  Interventions  Or daibetes specialist.  Need eye exam  For the diabetes if not done Due for pneumonia vaccines .

## 2015-07-10 ENCOUNTER — Encounter: Payer: Self-pay | Admitting: Family Medicine

## 2015-08-20 ENCOUNTER — Other Ambulatory Visit: Payer: Self-pay | Admitting: Family Medicine

## 2015-08-20 ENCOUNTER — Telehealth: Payer: Self-pay | Admitting: Family Medicine

## 2015-08-20 DIAGNOSIS — R748 Abnormal levels of other serum enzymes: Secondary | ICD-10-CM

## 2015-08-20 DIAGNOSIS — R7989 Other specified abnormal findings of blood chemistry: Secondary | ICD-10-CM

## 2015-08-20 NOTE — Telephone Encounter (Signed)
Spoke to the pt about his lab results from 06/25/15.  He has switched to plain lisinopril.  Stopped the potassium.  He has scheduled lab work for 09/03/15 @ 11:30.  Orders placed in the system.

## 2015-09-03 ENCOUNTER — Other Ambulatory Visit: Payer: BLUE CROSS/BLUE SHIELD

## 2015-10-09 ENCOUNTER — Other Ambulatory Visit (INDEPENDENT_AMBULATORY_CARE_PROVIDER_SITE_OTHER): Payer: BLUE CROSS/BLUE SHIELD

## 2015-10-09 DIAGNOSIS — R748 Abnormal levels of other serum enzymes: Secondary | ICD-10-CM | POA: Diagnosis not present

## 2015-10-09 DIAGNOSIS — R7989 Other specified abnormal findings of blood chemistry: Secondary | ICD-10-CM

## 2015-10-09 LAB — BASIC METABOLIC PANEL
BUN: 11 mg/dL (ref 6–23)
CO2: 28 meq/L (ref 19–32)
CREATININE: 1.07 mg/dL (ref 0.40–1.50)
Calcium: 9.5 mg/dL (ref 8.4–10.5)
Chloride: 97 mEq/L (ref 96–112)
GFR: 97.68 mL/min (ref >60.00)
Glucose, Bld: 186 mg/dL — ABNORMAL HIGH (ref 70–99)
Potassium: 4.1 mEq/L (ref 3.5–5.1)
SODIUM: 135 meq/L (ref 135–145)

## 2015-10-09 LAB — MICROALBUMIN / CREATININE URINE RATIO
CREATININE, U: 104.9 mg/dL
MICROALB/CREAT RATIO: 0.7 mg/g (ref 0.0–30.0)
Microalb, Ur: <0.7 mg/dL (ref 0.0–1.9)

## 2015-10-15 ENCOUNTER — Telehealth: Payer: Self-pay | Admitting: Family Medicine

## 2015-10-15 ENCOUNTER — Other Ambulatory Visit: Payer: Self-pay | Admitting: Family Medicine

## 2015-10-15 DIAGNOSIS — E1165 Type 2 diabetes mellitus with hyperglycemia: Principal | ICD-10-CM

## 2015-10-15 DIAGNOSIS — IMO0001 Reserved for inherently not codable concepts without codable children: Secondary | ICD-10-CM

## 2015-10-15 DIAGNOSIS — Z0000 Encounter for general adult medical examination without abnormal findings: Secondary | ICD-10-CM

## 2015-10-15 NOTE — Telephone Encounter (Signed)
Pt is due for fasting lab work and CPX in April 2017.  I have placed the lab orders.  Please help the pt to make both appointments.  Thanks!

## 2015-10-15 NOTE — Telephone Encounter (Signed)
lmom for pt to call back

## 2015-10-16 NOTE — Telephone Encounter (Signed)
Pt scheduled  

## 2015-10-24 ENCOUNTER — Encounter (HOSPITAL_COMMUNITY): Payer: Self-pay | Admitting: Emergency Medicine

## 2015-10-24 ENCOUNTER — Emergency Department (HOSPITAL_COMMUNITY)
Admission: EM | Admit: 2015-10-24 | Discharge: 2015-10-24 | Disposition: A | Discharge status: Home / Self Care | Payer: BLUE CROSS/BLUE SHIELD | Attending: Emergency Medicine | Admitting: Emergency Medicine

## 2015-10-24 ENCOUNTER — Emergency Department (HOSPITAL_COMMUNITY): Payer: BLUE CROSS/BLUE SHIELD

## 2015-10-24 DIAGNOSIS — F17200 Nicotine dependence, unspecified, uncomplicated: Secondary | ICD-10-CM | POA: Insufficient documentation

## 2015-10-24 DIAGNOSIS — R1084 Generalized abdominal pain: Secondary | ICD-10-CM | POA: Diagnosis present

## 2015-10-24 DIAGNOSIS — I10 Essential (primary) hypertension: Secondary | ICD-10-CM | POA: Diagnosis not present

## 2015-10-24 DIAGNOSIS — Z79899 Other long term (current) drug therapy: Secondary | ICD-10-CM | POA: Insufficient documentation

## 2015-10-24 DIAGNOSIS — E119 Type 2 diabetes mellitus without complications: Secondary | ICD-10-CM | POA: Insufficient documentation

## 2015-10-24 DIAGNOSIS — K858 Other acute pancreatitis without necrosis or infection: Secondary | ICD-10-CM | POA: Insufficient documentation

## 2015-10-24 DIAGNOSIS — Z7984 Long term (current) use of oral hypoglycemic drugs: Secondary | ICD-10-CM | POA: Insufficient documentation

## 2015-10-24 DIAGNOSIS — K219 Gastro-esophageal reflux disease without esophagitis: Secondary | ICD-10-CM | POA: Diagnosis not present

## 2015-10-24 LAB — COMPREHENSIVE METABOLIC PANEL
ALBUMIN: 4.4 g/dL (ref 3.5–5.0)
ALT: 12 U/L — AB (ref 17–63)
AST: 13 U/L — AB (ref 15–41)
Alkaline Phosphatase: 63 U/L (ref 38–126)
Anion gap: 10 (ref 5–15)
BUN: 6 mg/dL (ref 6–20)
CHLORIDE: 96 mmol/L — AB (ref 101–111)
CO2: 30 mmol/L (ref 22–32)
CREATININE: 1.05 mg/dL (ref 0.61–1.24)
Calcium: 9.5 mg/dL (ref 8.9–10.3)
GFR calc non Af Amer: >60 mL/min (ref >60)
GLUCOSE: 240 mg/dL — AB (ref 65–99)
Potassium: 4.3 mmol/L (ref 3.5–5.1)
SODIUM: 136 mmol/L (ref 135–145)
Total Bilirubin: 1.1 mg/dL (ref 0.3–1.2)
Total Protein: 8.2 g/dL — ABNORMAL HIGH (ref 6.5–8.1)

## 2015-10-24 LAB — URINE MICROSCOPIC-ADD ON: WBC, UA: NONE SEEN WBC/hpf (ref 0–5)

## 2015-10-24 LAB — CBC WITH DIFFERENTIAL/PLATELET
BASOS ABS: 0 10*3/uL (ref 0.0–0.1)
BASOS PCT: 0 %
EOS ABS: 0.1 10*3/uL (ref 0.0–0.7)
EOS PCT: 0 %
HCT: 47.2 % (ref 39.0–52.0)
Hemoglobin: 16.6 g/dL (ref 13.0–17.0)
Lymphocytes Relative: 7 %
Lymphs Abs: 0.8 10*3/uL (ref 0.7–4.0)
MCH: 31.9 pg (ref 26.0–34.0)
MCHC: 35.2 g/dL (ref 30.0–36.0)
MCV: 90.6 fL (ref 78.0–100.0)
Monocytes Absolute: 0.8 10*3/uL (ref 0.1–1.0)
Monocytes Relative: 6 %
NEUTROS PCT: 87 %
Neutro Abs: 10.5 10*3/uL — ABNORMAL HIGH (ref 1.7–7.7)
PLATELETS: 220 10*3/uL (ref 150–400)
RBC: 5.21 MIL/uL (ref 4.22–5.81)
RDW: 11.7 % (ref 11.5–15.5)
WBC: 12.1 10*3/uL — AB (ref 4.0–10.5)

## 2015-10-24 LAB — URINALYSIS, ROUTINE W REFLEX MICROSCOPIC
Bilirubin Urine: NEGATIVE
Glucose, UA: 100 mg/dL — AB
Leukocytes, UA: NEGATIVE
Nitrite: NEGATIVE
pH: 7.5 (ref 5.0–8.0)

## 2015-10-24 LAB — LIPASE, BLOOD: Lipase: 25 U/L (ref 11–51)

## 2015-10-24 LAB — AMYLASE: AMYLASE: 47 U/L (ref 28–100)

## 2015-10-24 MED ORDER — HYDROCODONE-ACETAMINOPHEN 5-325 MG PO TABS: 1.0000 | ORAL_TABLET | Freq: Four times a day (QID) | ORAL | Status: DC | PRN

## 2015-10-24 MED ORDER — KETOROLAC TROMETHAMINE 30 MG/ML IJ SOLN
30.0000 mg | Freq: Once | INTRAMUSCULAR | Status: AC
  Administered 2015-10-24: 30 mg via INTRAVENOUS
  Filled 2015-10-24: qty 1

## 2015-10-24 MED ORDER — ONDANSETRON HCL 4 MG/2ML IJ SOLN
4.0000 mg | Freq: Once | INTRAMUSCULAR | Status: AC
  Administered 2015-10-24: 4 mg via INTRAVENOUS
  Filled 2015-10-24: qty 2

## 2015-10-24 MED ORDER — HYDROMORPHONE HCL 1 MG/ML IJ SOLN
1.0000 mg | Freq: Once | INTRAMUSCULAR | Status: AC
  Administered 2015-10-24: 1 mg via INTRAVENOUS
  Filled 2015-10-24: qty 1

## 2015-10-24 MED ORDER — IOHEXOL 300 MG/ML  SOLN
100.0000 mL | Freq: Once | INTRAMUSCULAR | Status: AC | PRN
  Administered 2015-10-24: 100 mL via INTRAVENOUS

## 2015-10-24 MED ORDER — ONDANSETRON 4 MG PO TBDP: ORAL_TABLET | ORAL | Status: DC

## 2015-10-24 NOTE — ED Notes (Signed)
Pt states that he has been having abdominal and back pain since Tuesday.  Took laxative with no relief.

## 2015-10-24 NOTE — ED Provider Notes (Signed)
CSN: [id number]     Arrival date & time 10/24/15  1753 History   First MD Initiated Contact with Patient 10/24/15 1934     Chief Complaint  Patient presents with  . Abdominal Pain     (Consider location/radiation/quality/duration/timing/severity/associated sxs/prior Treatment) Patient is a 40 y.o. male presenting with abdominal pain. The history is provided by the patient (The patient complains of abdominal pain. This is been going on for a few days).  Abdominal Pain Pain location:  Generalized Pain quality: aching   Pain radiates to:  Does not radiate Pain severity:  Moderate Onset quality:  Sudden Timing:  Constant Progression:  Waxing and waning Chronicity:  New Associated symptoms: no chest pain, no cough, no diarrhea, no fatigue and no hematuria     Past Medical History  Diagnosis Date  . Diabetes mellitus without complication ([place])   . Hypertension   . Pancreatitis   . GERD (gastroesophageal reflux disease)    Past Surgical History  Procedure Laterality Date  . Spinal fusion    . Neck surgery      2 disc   Family History  Problem Relation Age of Onset  . Colon cancer Neg Hx   . Pancreatitis Neg Hx   . Pancreatic cancer Neg Hx   . Diabetes Mother   . Hypertension Mother   . Diabetes Father   . Hypertension Father   . Arthritis Maternal Grandmother   . Hypertension Maternal Grandfather   . Diabetes Maternal Grandfather    Social History  Substance Use Topics  . Smoking status: Current Every Day Smoker -- 1.00 packs/day  . Smokeless tobacco: None  . Alcohol Use: Yes     Comment: every weekend    Review of Systems  Constitutional: Negative for appetite change and fatigue.  HENT: Negative for congestion, ear discharge and sinus pressure.   Eyes: Negative for discharge.  Respiratory: Negative for cough.   Cardiovascular: Negative for chest pain.  Gastrointestinal: Positive for abdominal pain. Negative for diarrhea.  Genitourinary: Negative for frequency  and hematuria.  Musculoskeletal: Negative for back pain.  Skin: Negative for rash.  Neurological: Negative for seizures and headaches.  Psychiatric/Behavioral: Negative for hallucinations.      Allergies  Review of patient's allergies indicates no known allergies.  Home Medications   Prior to Admission medications   Medication Sig Start Date End Date Taking? Authorizing Provider  atorvastatin (LIPITOR) 20 MG tablet Take 1 tablet (20 mg total) by mouth daily. 06/25/15  Yes Burnis Medin, MD  empagliflozin (JARDIANCE) 10 MG TABS tablet Take 10 mg by mouth daily. 06/25/15  Yes Burnis Medin, MD  lisinopril (PRINIVIL,ZESTRIL) 20 MG tablet Take 1 tablet (20 mg total) by mouth daily. 06/25/15  Yes Burnis Medin, MD  metFORMIN (GLUCOPHAGE-XR) 500 MG 24 hr tablet Take 2 tablets (1,000 mg total) by mouth 2 (two) times daily. 06/25/15  Yes Burnis Medin, MD  pantoprazole (PROTONIX) 40 MG tablet Take 1 tablet (40 mg total) by mouth 2 (two) times daily. Patient taking differently: Take 40 mg by mouth daily.  06/25/15  Yes Burnis Medin, MD  glucose blood (ONE TOUCH TEST STRIPS) test strip Use as instructed 06/25/15   Burnis Medin, MD  HYDROcodone-acetaminophen (NORCO/VICODIN) 5-325 MG tablet Take 1 tablet by mouth every 6 (six) hours as needed. 10/24/15   Milton Ferguson, MD  lisinopril-hydrochlorothiazide (PRINZIDE,ZESTORETIC) 20-25 MG per tablet Take 1 tablet by mouth daily. Patient not taking: Reported on 10/24/2015 02/19/15  Burnis Medin, MD  ondansetron (ZOFRAN ODT) 4 MG disintegrating tablet 9m ODT q4 hours prn nausea/vomit 10/24/15   JMilton Ferguson MD  tadalafil (CIALIS) 20 MG tablet Take 1 tablet (20 mg total) by mouth daily as needed for erectile dysfunction. Patient not taking: Reported on 10/24/2015 06/25/15   WBurnis Medin MD  varenicline (CHANTIX STARTING MONTH PAK) 0.5 MG X 11 & 1 MG X 42 tablet 0.5 mg tablet by po Qd for 3 days,then0.5 mg tablet twice daily for 4 days, then 1 mg tablet  bid. Patient not taking: Reported on 10/24/2015 06/25/15   WBurnis Medin MD   BP 136/80 mmHg  Pulse 93  Temp(Src) 99.3 F (37.4 C) (Oral)  Resp 20  Ht 6' 2"  (1.88 m)  Wt 220 lb (99.791 kg)  BMI 28.23 kg/m2  SpO2 100% Physical Exam  Constitutional: He is oriented to person, place, and time. He appears well-developed.  HENT:  Head: Normocephalic.  Eyes: Conjunctivae and EOM are normal. No scleral icterus.  Neck: Neck supple. No thyromegaly present.  Cardiovascular: Normal rate and regular rhythm.  Exam reveals no gallop and no friction rub.   No murmur heard. Pulmonary/Chest: No stridor. He has no wheezes. He has no rales. He exhibits no tenderness.  Abdominal: He exhibits no distension. There is tenderness. There is no rebound.  Musculoskeletal: Normal range of motion. He exhibits no edema.  Lymphadenopathy:    He has no cervical adenopathy.  Neurological: He is oriented to person, place, and time. He exhibits normal muscle tone. Coordination normal.  Skin: No rash noted. No erythema.  Psychiatric: He has a normal mood and affect. His behavior is normal.    ED Course  Procedures (including critical care time) Labs Review Labs Reviewed  CBC WITH DIFFERENTIAL/PLATELET - Abnormal; Notable for the following:    WBC 12.1 (*)    Neutro Abs 10.5 (*)    All other components within normal limits  COMPREHENSIVE METABOLIC PANEL - Abnormal; Notable for the following:    Chloride 96 (*)    Glucose, Bld 240 (*)    Total Protein 8.2 (*)    AST 13 (*)    ALT 12 (*)    All other components within normal limits  URINALYSIS, ROUTINE W REFLEX MICROSCOPIC [facility] - Abnormal; Notable for the following:    Specific Gravity, Urine <1.005 (*)    Glucose, UA 100 (*)    Hgb urine dipstick TRACE (*)    Ketones, ur TRACE (*)    Protein, ur TRACE (*)    All other components within normal limits  URINE MICROSCOPIC-ADD ON - Abnormal; Notable for the following:    Squamous Epithelial / LPF  0-5 (*)    Bacteria, UA RARE (*)    All other components within normal limits  AMYLASE  LIPASE, BLOOD    Imaging Review Ct Abdomen Pelvis W Contrast  10/24/2015  CLINICAL DATA:  40year old male with lower back and abdominal pain since 10/22/2015. EXAM: CT ABDOMEN AND PELVIS WITH CONTRAST TECHNIQUE: Multidetector CT imaging of the abdomen and pelvis was performed using the standard protocol following bolus administration of intravenous contrast. CONTRAST:  1012mOMNIPAQUE IOHEXOL 300 MG/ML  SOLN COMPARISON:  CT the abdomen and pelvis 10/24/2013. MRI of the abdomen 10/27/2013. FINDINGS: Lower chest:  Unremarkable. Hepatobiliary: No cystic or solid hepatic lesions. No intra or extrahepatic biliary ductal dilatation. Gallbladder is normal in appearance. Pancreas: Subtle inflammatory changes adjacent to the tail of the pancreas. No  pancreatic mass. No pancreatic ductal dilatation. Spleen: Unremarkable. Adrenals/Urinary Tract: Bilateral adrenal glands and bilateral kidneys are normal in appearance. No hydroureteronephrosis. Urinary bladder is normal in appearance. Stomach/Bowel: Normal appearance of the stomach. No pathologic dilatation of small bowel or colon. Normal appendix. Vascular/Lymphatic: Mild atherosclerosis throughout the abdominal and pelvic vasculature, without evidence of aneurysm or dissection. No lymphadenopathy noted in the abdomen or pelvis. Reproductive: Prostate gland and seminal vesicles are unremarkable in appearance. Other: No significant volume of ascites.  No pneumoperitoneum. Musculoskeletal: There are no aggressive appearing lytic or blastic lesions noted in the visualized portions of the skeleton. IMPRESSION: 1. Subtle inflammatory changes adjacent to the tail of the pancreas, concerning for early acute pancreatitis. 2. Normal appendix. 3. Atherosclerosis. Electronically Signed   By: Vinnie Langton M.D.   On: 10/24/2015 20:12   I have personally reviewed and evaluated these images  and lab results as part of my medical decision-making.   EKG Interpretation None      MDM   Final diagnoses:  Other acute pancreatitis    CT scan shows mild pancreatitis. Patient symptoms improved with pain medicine and nausea medicine. He'll be sent home with Vicodin and Zofran and will follow-up with his PCP next week. patient instructed to take liquids only this weekend    Milton Ferguson, MD 10/24/15 2209

## 2015-10-24 NOTE — Discharge Instructions (Signed)
Take liquids only over the weekend. Follow-up with her doctor next week

## 2015-10-28 ENCOUNTER — Ambulatory Visit (INDEPENDENT_AMBULATORY_CARE_PROVIDER_SITE_OTHER): Payer: BLUE CROSS/BLUE SHIELD | Admitting: Family Medicine

## 2015-10-28 ENCOUNTER — Encounter: Payer: Self-pay | Admitting: Family Medicine

## 2015-10-28 VITALS — BP 136/88 | HR 94 | Temp 98.2°F | Wt 221.5 lb

## 2015-10-28 DIAGNOSIS — K858 Other acute pancreatitis without necrosis or infection: Secondary | ICD-10-CM | POA: Insufficient documentation

## 2015-10-28 DIAGNOSIS — Z8719 Personal history of other diseases of the digestive system: Secondary | ICD-10-CM

## 2015-10-28 DIAGNOSIS — K859 Acute pancreatitis without necrosis or infection, unspecified: Secondary | ICD-10-CM

## 2015-10-28 DIAGNOSIS — K861 Other chronic pancreatitis: Secondary | ICD-10-CM | POA: Insufficient documentation

## 2015-10-28 MED ORDER — HYDROCODONE-ACETAMINOPHEN 5-325 MG PO TABS: 1.0000 | ORAL_TABLET | Freq: Four times a day (QID) | ORAL | Status: DC | PRN

## 2015-10-28 NOTE — Progress Notes (Signed)
Pre visit review using our clinic review tool, if applicable. No additional management support is needed unless otherwise documented below in the visit note. 

## 2015-10-28 NOTE — Progress Notes (Signed)
   Subjective:    Patient ID: Todd Rodriguez, male    DOB: 1976-09-10, 40 y.o.   MRN: [id number]  HPI    Review of Systems     Objective:   Physical Exam        Assessment & Plan:

## 2015-10-28 NOTE — Patient Instructions (Signed)
Continue drinking fluids to maintain hydration as discussed. Avoid all alcohol. Diet low residue, low fat with examples of diluted juices, broths, soups, now and increase as tolerated. If symptoms do not improve in 3-4 days symptoms worsen, please contact clinic for further follow up.   Low-Fat Diet for Pancreatitis or Gallbladder Conditions A low-fat diet can be helpful if you have pancreatitis or a gallbladder condition. With these conditions, your pancreas and gallbladder have trouble digesting fats. A healthy eating plan with less fat will help rest your pancreas and gallbladder and reduce your symptoms. WHAT DO I NEED TO KNOW ABOUT THIS DIET?  Eat a low-fat diet.  Reduce your fat intake to less than 20-30% of your total daily calories. This is less than 50-60 g of fat per day.  Remember that you need some fat in your diet. Ask your dietician what your daily goal should be.  Choose nonfat and low-fat healthy foods. Look for the words "nonfat," "low fat," or "fat free."  As a guide, look on the label and choose foods with less than 3 g of fat per serving. Eat only one serving.  Avoid alcohol.  Do not smoke. If you need help quitting, talk with your health care provider.  Eat small frequent meals instead of three large heavy meals. WHAT FOODS CAN I EAT? Grains Include healthy grains and starches such as potatoes, wheat bread, fiber-rich cereal, and brown rice. Choose whole grain options whenever possible. In adults, whole grains should account for 45-65% of your daily calories.  Fruits and Vegetables Eat plenty of fruits and vegetables. Fresh fruits and vegetables add fiber to your diet. Meats and Other Protein Sources Eat lean meat such as chicken and pork. Trim any fat off of meat before cooking it. Eggs, fish, and beans are other sources of protein. In adults, these foods should account for 10-35% of your daily calories. Dairy Choose low-fat milk and dairy options. Dairy includes  fat and protein, as well as calcium.  Fats and Oils Limit high-fat foods such as fried foods, sweets, baked goods, sugary drinks.  Other Creamy sauces and condiments, such as mayonnaise, can add extra fat. Think about whether or not you need to use them, or use smaller amounts or low fat options. WHAT FOODS ARE NOT RECOMMENDED?  High fat foods, such as:  [employer].  Ice cream.  [place] toast.  Sweet rolls.  Pizza.  Cheese bread.  Foods covered with batter, butter, creamy sauces, or cheese.  Fried foods.  Sugary drinks and desserts.  Foods that cause gas or bloating   This information is not intended to replace advice given to you by your health care provider. Make sure you discuss any questions you have with your health care provider.   Document Released: 09/26/2013 Document Reviewed: 09/26/2013 Elsevier Interactive Patient Education [employer].

## 2015-10-28 NOTE — Progress Notes (Signed)
Subjective:    Patient ID: Todd Rodriguez, male    DOB: 14-Jan-1976, 40 y.o.   MRN: [id number]  HPI  Mr. Gendron is a 40 year old male who presents today for a follow up visit to the ER for acute pancreatitis. He has a history of pancreatitis and states that the symptoms he experienced are similar to previous episodes.  Abdominal pain today is described as achy, generalized over mid abdomen, rating as a 5-6 today which is down from 9-10 when symptoms started.  He states that his pain does not radiate to his back now but he had experienced this during his visit to the ER.  He is taking Vicodin for pain every 6 hours which he states provides benefit "some of the time." His diet is consisting of fluids such as water, gatorade, broths, and soups. His appetite is limited at this time.  He denies nausea, vomiting, and all ETOH intake. He states that his last drink was over 1 week ago which consisted of 2-3 drinks of vodka only on weekend nights. He denies GERD symptoms and states that protonix is controlling his symptoms. He monitors his blood sugar daily which runs in the low 90's fasting AM.  He has not checked his blood sugar in a couple of days but will resume this today.   His blood pressure is elevated today which he states is due to "being late for his appointment" and "pain".    Review of Systems  Constitutional: Negative for fever and chills.       Decreased appetite during this episode  Respiratory: Negative for cough, chest tightness and shortness of breath.   Cardiovascular: Negative for chest pain and leg swelling.  Gastrointestinal: Positive for abdominal pain. Negative for nausea, vomiting, blood in stool and abdominal distention.       Patient has noted loose stools since episode started  Endocrine: Negative for polydipsia, polyphagia and polyuria.  Genitourinary: Negative for dysuria, urgency and frequency.   Past Medical History  Diagnosis Date  . Diabetes mellitus without  complication ([place])   . Hypertension   . Pancreatitis   . GERD (gastroesophageal reflux disease)     Social History   Social History  . Marital Status: Married    Spouse Name: N/A  . Number of Children: 1  . Years of Education: N/A   Occupational History  .  [employer] History Main Topics  . Smoking status: Current Every Day Smoker -- 1.00 packs/day  . Smokeless tobacco: Not on file  . Alcohol Use: Yes     Comment: every weekend  . Drug Use: No  . Sexual Activity:    Partners: Female     Comment: Patient's Wife   Other Topics Concern  . Not on file   Social History Narrative   5 hours of sleep    2 people living in the home   A dog living in the home   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   Eye exam nany clark    FA tobacco some etoh neg rd exercises      Plays drummer in a band                 Past Surgical History  Procedure Laterality Date  . Spinal fusion    . Neck surgery      2 disc    Family History  Problem Relation Age of Onset  . Colon  cancer Neg Hx   . Pancreatitis Neg Hx   . Pancreatic cancer Neg Hx   . Diabetes Mother   . Hypertension Mother   . Diabetes Father   . Hypertension Father   . Arthritis Maternal Grandmother   . Hypertension Maternal Grandfather   . Diabetes Maternal Grandfather     No Known Allergies  Current Outpatient Prescriptions on File Prior to Visit  Medication Sig Dispense Refill  . atorvastatin (LIPITOR) 20 MG tablet Take 1 tablet (20 mg total) by mouth daily. 90 tablet 1  . empagliflozin (JARDIANCE) 10 MG TABS tablet Take 10 mg by mouth daily. 30 tablet 12  . glucose blood (ONE TOUCH TEST STRIPS) test strip Use as instructed 100 each 11  . lisinopril (PRINIVIL,ZESTRIL) 20 MG tablet Take 1 tablet (20 mg total) by mouth daily. 90 tablet 1  . metFORMIN (GLUCOPHAGE-XR) 500 MG 24 hr tablet Take 2 tablets (1,000 mg total) by mouth 2 (two) times daily. 360 tablet 1    . ondansetron (ZOFRAN ODT) 4 MG disintegrating tablet 69m ODT q4 hours prn nausea/vomit 12 tablet 0  . pantoprazole (PROTONIX) 40 MG tablet Take 1 tablet (40 mg total) by mouth 2 (two) times daily. (Patient taking differently: Take 40 mg by mouth daily. ) 180 tablet 1  . tadalafil (CIALIS) 20 MG tablet Take 1 tablet (20 mg total) by mouth daily as needed for erectile dysfunction. 10 tablet 3   No current facility-administered medications on file prior to visit.    BP 136/88 mmHg  Pulse 94  Temp(Src) 98.2 F (36.8 C) (Oral)  Wt 221 lb 8 oz (100.472 kg)  SpO2 96%       Objective:   Physical Exam  Constitutional: He is oriented to person, place, and time. He appears well-developed and well-nourished.  Cardiovascular: Normal rate, regular rhythm and normal heart sounds.   Pulmonary/Chest: Effort normal and breath sounds normal. He has no wheezes. He has no rales.  Abdominal: Soft. Bowel sounds are normal. There is tenderness. There is no rebound.  Neurological: He is alert and oriented to person, place, and time.  Skin: Skin is warm and dry.          Assessment & Plan:  Suspect resolving pancreatitis: Pain is decreasing but still present. RX: hydrocodone-acetaminophen [id number] to use for pain as needed. Discussed low residue, low fat diet, with adequate hydration. Encouraged patient to increase diet as tolerated. Stressed the importance of avoiding all ETOH. Reviewed lab results from ER visit. Amylase and Lipase WNL. Patient has a history of pancreatitis and CT scan demonstrated subtle inflammatory changes adjacent to the tail of the pancreas. No ductal dilatation or mass noted. Findings suggested possible early acute pancreatis in the ER. Patient advised to follow up for further evaluation if symptoms do not improve in 3-4 days, worsen, or he develops new symptoms. Patient encouraged to keep upcoming physical with PCP as scheduled. He voiced understanding. Patient encouraged  to keep upcoming physical with PCP as scheduled and complete lab work prior to this visit as ordered.

## 2015-11-06 ENCOUNTER — Encounter: Payer: Self-pay | Admitting: Internal Medicine

## 2015-11-07 ENCOUNTER — Telehealth: Payer: Self-pay | Admitting: Internal Medicine

## 2015-11-07 NOTE — Telephone Encounter (Signed)
Wife states pt needs FMLA paperwork done by tomorrow , Friday 2/3 to give to employer.

## 2015-11-07 NOTE — Telephone Encounter (Signed)
Sent these papers up front.  Please check.

## 2016-01-07 ENCOUNTER — Other Ambulatory Visit (INDEPENDENT_AMBULATORY_CARE_PROVIDER_SITE_OTHER): Payer: BLUE CROSS/BLUE SHIELD

## 2016-01-07 DIAGNOSIS — E1165 Type 2 diabetes mellitus with hyperglycemia: Secondary | ICD-10-CM

## 2016-01-07 DIAGNOSIS — R7989 Other specified abnormal findings of blood chemistry: Secondary | ICD-10-CM

## 2016-01-07 DIAGNOSIS — Z0000 Encounter for general adult medical examination without abnormal findings: Secondary | ICD-10-CM

## 2016-01-07 DIAGNOSIS — IMO0001 Reserved for inherently not codable concepts without codable children: Secondary | ICD-10-CM

## 2016-01-07 LAB — CBC WITH DIFFERENTIAL/PLATELET
BASOS ABS: 0 10*3/uL (ref 0.0–0.1)
Basophils Relative: 0.6 % (ref 0.0–3.0)
EOS ABS: 0.2 10*3/uL (ref 0.0–0.7)
Eosinophils Relative: 4.3 % (ref 0.0–5.0)
HEMATOCRIT: 43.1 % (ref 39.0–52.0)
Hemoglobin: 15 g/dL (ref 13.0–17.0)
LYMPHS PCT: 22.1 % (ref 12.0–46.0)
Lymphs Abs: 1.3 10*3/uL (ref 0.7–4.0)
MCHC: 34.8 g/dL (ref 30.0–36.0)
MCV: 89.3 fl (ref 78.0–100.0)
Monocytes Absolute: 0.5 10*3/uL (ref 0.1–1.0)
Monocytes Relative: 9 % (ref 3.0–12.0)
NEUTROS ABS: 3.7 10*3/uL (ref 1.4–7.7)
NEUTROS PCT: 64 % (ref 43.0–77.0)
PLATELETS: 264 10*3/uL (ref 150.0–400.0)
RBC: 4.82 Mil/uL (ref 4.22–5.81)
RDW: 13.3 % (ref 11.5–15.5)
WBC: 5.8 10*3/uL (ref 4.0–10.5)

## 2016-01-07 LAB — HEPATIC FUNCTION PANEL
ALBUMIN: 4.7 g/dL (ref 3.5–5.2)
ALK PHOS: 65 U/L (ref 39–117)
ALT: 23 U/L (ref 0–53)
AST: 20 U/L (ref 0–37)
Bilirubin, Direct: 0.1 mg/dL (ref 0.0–0.3)
TOTAL PROTEIN: 7.5 g/dL (ref 6.0–8.3)
Total Bilirubin: 0.8 mg/dL (ref 0.2–1.2)

## 2016-01-07 LAB — LIPID PANEL
CHOLESTEROL: 191 mg/dL (ref 0–200)
HDL: 40.8 mg/dL (ref >39.00)
NonHDL: 150.59
TRIGLYCERIDES: 215 mg/dL — AB (ref 0.0–149.0)
Total CHOL/HDL Ratio: 5
VLDL: 43 mg/dL — ABNORMAL HIGH (ref 0.0–40.0)

## 2016-01-07 LAB — HEMOGLOBIN A1C: HEMOGLOBIN A1C: 9.2 % — AB (ref 4.6–6.5)

## 2016-01-07 LAB — PSA: PSA: 0.76 ng/mL (ref 0.10–4.00)

## 2016-01-07 LAB — BASIC METABOLIC PANEL
BUN: 8 mg/dL (ref 6–23)
CALCIUM: 9.9 mg/dL (ref 8.4–10.5)
CO2: 32 meq/L (ref 19–32)
CREATININE: 1.1 mg/dL (ref 0.40–1.50)
Chloride: 100 mEq/L (ref 96–112)
GFR: 94.5 mL/min (ref >60.00)
Glucose, Bld: 175 mg/dL — ABNORMAL HIGH (ref 70–99)
Potassium: 4 mEq/L (ref 3.5–5.1)
Sodium: 140 mEq/L (ref 135–145)

## 2016-01-07 LAB — LDL CHOLESTEROL, DIRECT: Direct LDL: 115 mg/dL

## 2016-01-07 LAB — TSH: TSH: 1.39 u[IU]/mL (ref 0.35–4.50)

## 2016-01-14 NOTE — Progress Notes (Signed)
Pre visit review using our clinic review tool, if applicable. No additional management support is needed unless otherwise documented below in the visit note.  Chief Complaint  Patient presents with  . Annual Exam  . Hypertension  . Diabetes    HPI: Patient  Todd Rodriguez  40 y.o. comes in today for Preventive Health Care visit  And Chronic disease management Works nights active but no time for exercise otherwise .  On weigh t watchers per wife  intervention eating better for the last month except with the [employer].    Not checking bgs .  Only taking 2 metformin per day not 4 rest of meds ok  Eye check due. To get this No numbness pancreatitis  Sx .  Has had a chest cold flu amonth ago and still has an irritation cough not at night but day after out or mowing grass with runny nose . No hx allergy no sob wheeze.   Health Maintenance  Topic Date Due  . PNEUMOCOCCAL POLYSACCHARIDE VACCINE (1) 03/22/1976  . OPHTHALMOLOGY EXAM  03/22/1984  . HIV Screening  03/22/1989  . INFLUENZA VACCINE  06/24/2016 (Originally 05/05/2016)  . HEMOGLOBIN A1C  07/08/2016  . FOOT EXAM  01/14/2017  . TETANUS/TDAP  11/05/2018   Health Maintenance Review LIFESTYLE:  Exercise:  Work no time  Tobacco/ETS:  Slowed down 1 ppd to 1/2ppd .  Works nights  Alcohol:  Slowed  Down every blue moon. Sugar beverages:  Sleep: 6-7  Drug use: no   ROS: x above GEN/ HEENT: No fever, significant weight changes sweats headaches vision problems hearing changes, , syncope,edema  change in exercise tolerance. GI /GU: No adominal pain, vomiting, change in bowel habits. No blood in the stool. No significant GU symptoms. SKIN/HEME: ,no acute skin rashes suspicious lesions or bleeding. No lymphadenopathy, nodules, masses.  NEURO/ PSYCH:  No neurologic signs such as weakness numbness. No depression anxiety. IMM/ Allergy: No unusual infections.  Allergy .   REST of 12 system review negative except as per  HPI   Past Medical History  Diagnosis Date  . Diabetes mellitus without complication ([place])   . Hypertension   . Pancreatitis   . GERD (gastroesophageal reflux disease)     Past Surgical History  Procedure Laterality Date  . Spinal fusion    . Neck surgery      2 disc    Family History  Problem Relation Age of Onset  . Colon cancer Neg Hx   . Pancreatitis Neg Hx   . Pancreatic cancer Neg Hx   . Diabetes Mother   . Hypertension Mother   . Diabetes Father   . Hypertension Father   . Arthritis Maternal Grandmother   . Hypertension Maternal Grandfather   . Diabetes Maternal Grandfather     Social History   Social History  . Marital Status: Married    Spouse Name: N/A  . Number of Children: 1  . Years of Education: N/A   Occupational History  .  [employer] History Main Topics  . Smoking status: Current Every Day Smoker -- 1.00 packs/day  . Smokeless tobacco: None  . Alcohol Use: Yes     Comment: every weekend  . Drug Use: No  . Sexual Activity:    Partners: Female     Comment: Patient's Wife   Other Topics Concern  . None   Social History Narrative   5 hours of sleep    2 people living in  the home   A dog living in the home   [employer] night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   Eye exam nany clark    FA tobacco some etoh neg rd exercises      Plays drummer in a band                 Outpatient Prescriptions Prior to Visit  Medication Sig Dispense Refill  . atorvastatin (LIPITOR) 20 MG tablet Take 1 tablet (20 mg total) by mouth daily. 90 tablet 1  . empagliflozin (JARDIANCE) 10 MG TABS tablet Take 10 mg by mouth daily. 30 tablet 12  . glucose blood (ONE TOUCH TEST STRIPS) test strip Use as instructed 100 each 11  . HYDROcodone-acetaminophen (NORCO/VICODIN) 5-325 MG tablet Take 1 tablet by mouth every 6 (six) hours as needed. 20 tablet 0  . lisinopril (PRINIVIL,ZESTRIL) 20 MG tablet Take 1 tablet (20 mg total)  by mouth daily. 90 tablet 1  . metFORMIN (GLUCOPHAGE-XR) 500 MG 24 hr tablet Take 2 tablets (1,000 mg total) by mouth 2 (two) times daily. 360 tablet 1  . pantoprazole (PROTONIX) 40 MG tablet Take 1 tablet (40 mg total) by mouth 2 (two) times daily. (Patient taking differently: Take 40 mg by mouth daily. ) 180 tablet 1  . tadalafil (CIALIS) 20 MG tablet Take 1 tablet (20 mg total) by mouth daily as needed for erectile dysfunction. 10 tablet 3  . ondansetron (ZOFRAN ODT) 4 MG disintegrating tablet 57m ODT q4 hours prn nausea/vomit 12 tablet 0   No facility-administered medications prior to visit.     EXAM:  BP 126/84 mmHg  Temp(Src) 99 F (37.2 C) (Oral)  Ht 6' 0.5" (1.842 m)  Wt 216 lb (97.977 kg)  BMI 28.88 kg/m2  Body mass index is 28.88 kg/(m^2).  Physical Exam: Vital signs reviewed GJFH:LKTGis a well-developed well-nourished alert cooperative    who appearsr stated age in no acute distress.  HEENT: normocephalic atraumatic , Eyes: PERRL EOM's full, conjunctiva muddy  Nares: paten,t no deformity discharge or tenderness.midl congestino, Ears: no deformity EAC's clear TMs with normal landmarks. Mouth: clear OP, no lesions, edema.  Moist mucous membranes. Dentition in adequate repair. NECK: supple without masses, thyromegaly or bruits. CHEST/PULM:  Clear to auscultation and percussion breath sounds equal no wheeze , rales or rhonchi. No chest wall deformities or tenderness. CV: PMI is nondisplaced, S1 S2 no gallops, murmurs, rubs. Peripheral pulses are full without delay.No JVD .  ABDOMEN: Bowel sounds normal nontender  No guard or rebound, no hepato splenomegal no CVA tenderness.  No hernia. Extremtities:  No clubbing cyanosis or edema, no acute joint swelling or redness no focal atrophy NEURO:  Oriented x3, cranial nerves 3-12 appear to be intact, no obvious focal weakness,gait within normal limits no abnormal reflexes or asymmetrical SKIN: No acute rashes normal turgor, color, no  bruising or petechiae. PSYCH: Oriented, good eye contact, no obvious depression anxiety, cognition and judgment appear normal. LN: no cervical axillary inguinal adenopathy Diabetic Foot Exam - Simple   Simple Foot Form  Diabetic Foot exam was performed with the following findings:  Yes 01/15/2016  3:09 PM  Visual Inspection  No deformities, no ulcerations, no other skin breakdown bilaterally:  Yes  Sensation Testing  Intact to touch and monofilament testing bilaterally:  Yes  Pulse Check  Posterior Tibialis and Dorsalis pulse intact bilaterally:  Yes  Comments      Lab Results  Component Value Date  WBC 5.8 01/07/2016   HGB 15.0 01/07/2016   HCT 43.1 01/07/2016   PLT 264.0 01/07/2016   GLUCOSE 175* 01/07/2016   CHOL 191 01/07/2016   TRIG 215.0* 01/07/2016   HDL 40.80 01/07/2016   LDLDIRECT 115.0 01/07/2016   LDLCALC 50 04/12/2014   ALT 23 01/07/2016   AST 20 01/07/2016   NA 140 01/07/2016   K 4.0 01/07/2016   CL 100 01/07/2016   CREATININE 1.10 01/07/2016   BUN 8 01/07/2016   CO2 32 01/07/2016   TSH 1.39 01/07/2016   PSA 0.76 01/07/2016   HGBA1C 9.2* 01/07/2016   MICROALBUR <0.7 10/09/2015    ASSESSMENT AND PLAN:  Discussed the following assessment and plan:  Visit for preventive health examination  Uncontrolled type 2 diabetes mellitus without complication, without long-term current use of insulin ([place]) - revewied  take the max metformin and check bg and cont weigh twatcher  ( only on for a month)? nutriion referral?     Tobacco dependence - chantix caused night mares try patch etc  Essential hypertension, benign - controlled  Hyperlipidemia - cont atorva intesnify lsi  Need for vaccination with 13-polyvalent pneumococcal conjugate vaccine - Plan: Pneumococcal conjugate vaccine 13-valent  Cough - post viral but po tobacco and on ace  however sounds like allergy wo hx of same see instruction plan  Hx of acute pancreatitis - dsic caution etoh etc  may not be  a candidate for some diabetes meds  Patient Care Team: Burnis Medin, MD as PCP - General (Internal Medicine) Mahala Menghini, PA-C as Physician Assistant (Gastroenterology) Patient Instructions  Continue lifestyle intervention healthy eating and exercise .  Your blood sugar   3 months test was a lot worse  Increase metformin to 2 in am and 2 in pm .  Check blood sugar at least 2 days per week .   Consider seeing  nutritionist and or diabetes specialist  bg not coming down  There are other meds to add but  Some you cant take cause you have a history of pancreatitis.   blood pressure is good today   Try adding flonase  Nasal spray every day  And  Antihistamine such as  Generic claritin and see if cough better  If not then we may get a chest x ray and change your BP medication   . Get your eye exam as planned.   rov in 3 months   Will check hga1c at that time in office .  Avoid alcohol cause of your hx of  Pancreatitis.  Can try patch for tobacco cessation also      Health Maintenance, Male A healthy lifestyle and preventative care can promote health and wellness.  Maintain regular health, dental, and eye exams.  Eat a healthy diet. Foods like vegetables, fruits, whole grains, low-fat dairy products, and lean protein foods contain the nutrients you need and are low in calories. Decrease your intake of foods high in solid fats, added sugars, and salt. Get information about a proper diet from your health care provider, if necessary.  Regular physical exercise is one of the most important things you can do for your health. Most adults should get at least 150 minutes of moderate-intensity exercise (any activity that increases your heart rate and causes you to sweat) each week. In addition, most adults need muscle-strengthening exercises on 2 or more days a week.   Maintain a healthy weight. The body mass index (BMI) is a screening tool to identify  possible weight problems. It provides an  estimate of body fat based on height and weight. Your health care provider can find your BMI and can help you achieve or maintain a healthy weight. For males 20 years and older:  A BMI below 18.5 is considered underweight.  A BMI of 18.5 to 24.9 is normal.  A BMI of 25 to 29.9 is considered overweight.  A BMI of 30 and above is considered obese.  Maintain normal blood lipids and cholesterol by exercising and minimizing your intake of saturated fat. Eat a balanced diet with plenty of fruits and vegetables. Blood tests for lipids and cholesterol should begin at age 50 and be repeated every 5 years. If your lipid or cholesterol levels are high, you are over age 73, or you are at high risk for heart disease, you may need your cholesterol levels checked more frequently.Ongoing high lipid and cholesterol levels should be treated with medicines if diet and exercise are not working.  If you smoke, find out from your health care provider how to quit. If you do not use tobacco, do not start.  Lung cancer screening is recommended for adults aged 66-80 years who are at high risk for developing lung cancer because of a history of smoking. A yearly low-dose CT scan of the lungs is recommended for people who have at least a 30-pack-year history of smoking and are current smokers or have quit within the past 15 years. A pack year of smoking is smoking an average of 1 pack of cigarettes a day for 1 year (for example, a 30-pack-year history of smoking could mean smoking 1 pack a day for 30 years or 2 packs a day for 15 years). Yearly screening should continue until the smoker has stopped smoking for at least 15 years. Yearly screening should be stopped for people who develop a health problem that would prevent them from having lung cancer treatment.  If you choose to drink alcohol, do not have more than 2 drinks per day. One drink is considered to be 12 oz (360 mL) of beer, 5 oz (150 mL) of wine, or 1.5 oz (45 mL) of  liquor.  Avoid the use of street drugs. Do not share needles with anyone. Ask for help if you need support or instructions about stopping the use of drugs.  High blood pressure causes heart disease and increases the risk of stroke. High blood pressure is more likely to develop in:  People who have blood pressure in the end of the normal range (100-139/85-89 mm Hg).  People who are overweight or obese.  People who are African American.  If you are 22-71 years of age, have your blood pressure checked every 3-5 years. If you are 77 years of age or older, have your blood pressure checked every year. You should have your blood pressure measured twice--once when you are at a hospital or clinic, and once when you are not at a hospital or clinic. Record the average of the two measurements. To check your blood pressure when you are not at a hospital or clinic, you can use:  An automated blood pressure machine at a pharmacy.  A home blood pressure monitor.  If you are 58-20 years old, ask your health care provider if you should take aspirin to prevent heart disease.  Diabetes screening involves taking a blood sample to check your fasting blood sugar level. This should be done once every 3 years after age 49 if you are at  a normal weight and without risk factors for diabetes. Testing should be considered at a younger age or be carried out more frequently if you are overweight and have at least 1 risk factor for diabetes.  Colorectal cancer can be detected and often prevented. Most routine colorectal cancer screening begins at the age of 87 and continues through age 54. However, your health care provider may recommend screening at an earlier age if you have risk factors for colon cancer. On a yearly basis, your health care provider may provide home test kits to check for hidden blood in the stool. A small camera at the end of a tube may be used to directly examine the colon (sigmoidoscopy or colonoscopy)  to detect the earliest forms of colorectal cancer. Talk to your health care provider about this at age 18 when routine screening begins. A direct exam of the colon should be repeated every 5-10 years through age 24, unless early forms of precancerous polyps or small growths are found.  People who are at an increased risk for hepatitis B should be screened for this virus. You are considered at high risk for hepatitis B if:  You were born in a country where hepatitis B occurs often. Talk with your health care provider about which countries are considered high risk.  Your parents were born in a high-risk country and you have not received a shot to protect against hepatitis B (hepatitis B vaccine).  You have HIV or AIDS.  You use needles to inject street drugs.  You live with, or have sex with, someone who has hepatitis B.  You are a man who has sex with other men (MSM).  You get hemodialysis treatment.  You take certain medicines for conditions like cancer, organ transplantation, and autoimmune conditions.  Hepatitis C blood testing is recommended for all people born from [birth year] through 1967 and any individual with known risk factors for hepatitis C.  Healthy men should no longer receive prostate-specific antigen (PSA) blood tests as part of routine cancer screening. Talk to your health care provider about prostate cancer screening.  Testicular cancer screening is not recommended for adolescents or adult males who have no symptoms. Screening includes self-exam, a health care provider exam, and other screening tests. Consult with your health care provider about any symptoms you have or any concerns you have about testicular cancer.  Practice safe sex. Use condoms and avoid high-risk sexual practices to reduce the spread of sexually transmitted infections (STIs).  You should be screened for STIs, including gonorrhea and chlamydia if:  You are sexually active and are younger than 24  years.  You are older than 24 years, and your health care provider tells you that you are at risk for this type of infection.  Your sexual activity has changed since you were last screened, and you are at an increased risk for chlamydia or gonorrhea. Ask your health care provider if you are at risk.  If you are at risk of being infected with HIV, it is recommended that you take a prescription medicine daily to prevent HIV infection. This is called pre-exposure prophylaxis (PrEP). You are considered at risk if:  You are a man who has sex with other men (MSM).  You are a heterosexual man who is sexually active with multiple partners.  You take drugs by injection.  You are sexually active with a partner who has HIV.  Talk with your health care provider about whether you are at high risk of  being infected with HIV. If you choose to begin PrEP, you should first be tested for HIV. You should then be tested every 3 months for as long as you are taking PrEP.  Use sunscreen. Apply sunscreen liberally and repeatedly throughout the day. You should seek shade when your shadow is shorter than you. Protect yourself by wearing long sleeves, pants, a wide-brimmed hat, and sunglasses year round whenever you are outdoors.  Tell your health care provider of new moles or changes in moles, especially if there is a change in shape or color. Also, tell your health care provider if a mole is larger than the size of a pencil eraser.  A one-time screening for abdominal aortic aneurysm (AAA) and surgical repair of large AAAs by ultrasound is recommended for men aged 89-75 years who are current or former smokers.  Stay current with your vaccines (immunizations).   This information is not intended to replace advice given to you by your health care provider. Make sure you discuss any questions you have with your health care provider.   Document Released: 03/19/2008 Document Revised: 10/12/2014 Document Reviewed:  02/16/2011 Elsevier Interactive Patient Education 2016 Rutherford K. Panosh M.D.

## 2016-01-14 NOTE — Patient Instructions (Addendum)
Continue lifestyle intervention healthy eating and exercise .  Your blood sugar   3 months test was a lot worse  Increase metformin to 2 in am and 2 in pm .  Check blood sugar at least 2 days per week .   Consider seeing  nutritionist and or diabetes specialist  bg not coming down  There are other meds to add but  Some you cant take cause you have a history of pancreatitis.   blood pressure is good today   Try adding flonase  Nasal spray every day  And  Antihistamine such as  Generic claritin and see if cough better  If not then we may get a chest x ray and change your BP medication   . Get your eye exam as planned.   rov in 3 months   Will check hga1c at that time in office .  Avoid alcohol cause of your hx of  Pancreatitis.  Can try patch for tobacco cessation also      Health Maintenance, Male A healthy lifestyle and preventative care can promote health and wellness.  Maintain regular health, dental, and eye exams.  Eat a healthy diet. Foods like vegetables, fruits, whole grains, low-fat dairy products, and lean protein foods contain the nutrients you need and are low in calories. Decrease your intake of foods high in solid fats, added sugars, and salt. Get information about a proper diet from your health care provider, if necessary.  Regular physical exercise is one of the most important things you can do for your health. Most adults should get at least 150 minutes of moderate-intensity exercise (any activity that increases your heart rate and causes you to sweat) each week. In addition, most adults need muscle-strengthening exercises on 2 or more days a week.   Maintain a healthy weight. The body mass index (BMI) is a screening tool to identify possible weight problems. It provides an estimate of body fat based on height and weight. Your health care provider can find your BMI and can help you achieve or maintain a healthy weight. For males 20 years and older:  A BMI below 18.5 is  considered underweight.  A BMI of 18.5 to 24.9 is normal.  A BMI of 25 to 29.9 is considered overweight.  A BMI of 30 and above is considered obese.  Maintain normal blood lipids and cholesterol by exercising and minimizing your intake of saturated fat. Eat a balanced diet with plenty of fruits and vegetables. Blood tests for lipids and cholesterol should begin at age 66 and be repeated every 5 years. If your lipid or cholesterol levels are high, you are over age 31, or you are at high risk for heart disease, you may need your cholesterol levels checked more frequently.Ongoing high lipid and cholesterol levels should be treated with medicines if diet and exercise are not working.  If you smoke, find out from your health care provider how to quit. If you do not use tobacco, do not start.  Lung cancer screening is recommended for adults aged 24-80 years who are at high risk for developing lung cancer because of a history of smoking. A yearly low-dose CT scan of the lungs is recommended for people who have at least a 30-pack-year history of smoking and are current smokers or have quit within the past 15 years. A pack year of smoking is smoking an average of 1 pack of cigarettes a day for 1 year (for example, a 30-pack-year history of smoking  could mean smoking 1 pack a day for 30 years or 2 packs a day for 15 years). Yearly screening should continue until the smoker has stopped smoking for at least 15 years. Yearly screening should be stopped for people who develop a health problem that would prevent them from having lung cancer treatment.  If you choose to drink alcohol, do not have more than 2 drinks per day. One drink is considered to be 12 oz (360 mL) of beer, 5 oz (150 mL) of wine, or 1.5 oz (45 mL) of liquor.  Avoid the use of street drugs. Do not share needles with anyone. Ask for help if you need support or instructions about stopping the use of drugs.  High blood pressure causes heart  disease and increases the risk of stroke. High blood pressure is more likely to develop in:  People who have blood pressure in the end of the normal range (100-139/85-89 mm Hg).  People who are overweight or obese.  People who are African American.  If you are 79-61 years of age, have your blood pressure checked every 3-5 years. If you are 36 years of age or older, have your blood pressure checked every year. You should have your blood pressure measured twice--once when you are at a hospital or clinic, and once when you are not at a hospital or clinic. Record the average of the two measurements. To check your blood pressure when you are not at a hospital or clinic, you can use:  An automated blood pressure machine at a pharmacy.  A home blood pressure monitor.  If you are 45-16 years old, ask your health care provider if you should take aspirin to prevent heart disease.  Diabetes screening involves taking a blood sample to check your fasting blood sugar level. This should be done once every 3 years after age 76 if you are at a normal weight and without risk factors for diabetes. Testing should be considered at a younger age or be carried out more frequently if you are overweight and have at least 1 risk factor for diabetes.  Colorectal cancer can be detected and often prevented. Most routine colorectal cancer screening begins at the age of 83 and continues through age 21. However, your health care provider may recommend screening at an earlier age if you have risk factors for colon cancer. On a yearly basis, your health care provider may provide home test kits to check for hidden blood in the stool. A small camera at the end of a tube may be used to directly examine the colon (sigmoidoscopy or colonoscopy) to detect the earliest forms of colorectal cancer. Talk to your health care provider about this at age 31 when routine screening begins. A direct exam of the colon should be repeated every 5-10  years through age 64, unless early forms of precancerous polyps or small growths are found.  People who are at an increased risk for hepatitis B should be screened for this virus. You are considered at high risk for hepatitis B if:  You were born in a country where hepatitis B occurs often. Talk with your health care provider about which countries are considered high risk.  Your parents were born in a high-risk country and you have not received a shot to protect against hepatitis B (hepatitis B vaccine).  You have HIV or AIDS.  You use needles to inject street drugs.  You live with, or have sex with, someone who has hepatitis B.  You are a man who has sex with other men (MSM).  You get hemodialysis treatment.  You take certain medicines for conditions like cancer, organ transplantation, and autoimmune conditions.  Hepatitis C blood testing is recommended for all people born from [birth year] through 1967 and any individual with known risk factors for hepatitis C.  Healthy men should no longer receive prostate-specific antigen (PSA) blood tests as part of routine cancer screening. Talk to your health care provider about prostate cancer screening.  Testicular cancer screening is not recommended for adolescents or adult males who have no symptoms. Screening includes self-exam, a health care provider exam, and other screening tests. Consult with your health care provider about any symptoms you have or any concerns you have about testicular cancer.  Practice safe sex. Use condoms and avoid high-risk sexual practices to reduce the spread of sexually transmitted infections (STIs).  You should be screened for STIs, including gonorrhea and chlamydia if:  You are sexually active and are younger than 24 years.  You are older than 24 years, and your health care provider tells you that you are at risk for this type of infection.  Your sexual activity has changed since you were last screened, and you are  at an increased risk for chlamydia or gonorrhea. Ask your health care provider if you are at risk.  If you are at risk of being infected with HIV, it is recommended that you take a prescription medicine daily to prevent HIV infection. This is called pre-exposure prophylaxis (PrEP). You are considered at risk if:  You are a man who has sex with other men (MSM).  You are a heterosexual man who is sexually active with multiple partners.  You take drugs by injection.  You are sexually active with a partner who has HIV.  Talk with your health care provider about whether you are at high risk of being infected with HIV. If you choose to begin PrEP, you should first be tested for HIV. You should then be tested every 3 months for as long as you are taking PrEP.  Use sunscreen. Apply sunscreen liberally and repeatedly throughout the day. You should seek shade when your shadow is shorter than you. Protect yourself by wearing long sleeves, pants, a wide-brimmed hat, and sunglasses year round whenever you are outdoors.  Tell your health care provider of new moles or changes in moles, especially if there is a change in shape or color. Also, tell your health care provider if a mole is larger than the size of a pencil eraser.  A one-time screening for abdominal aortic aneurysm (AAA) and surgical repair of large AAAs by ultrasound is recommended for men aged 46-75 years who are current or former smokers.  Stay current with your vaccines (immunizations).   This information is not intended to replace advice given to you by your health care provider. Make sure you discuss any questions you have with your health care provider.   Document Released: 03/19/2008 Document Revised: 10/12/2014 Document Reviewed: 02/16/2011 Elsevier Interactive Patient Education [employer].

## 2016-01-15 ENCOUNTER — Ambulatory Visit (INDEPENDENT_AMBULATORY_CARE_PROVIDER_SITE_OTHER): Payer: BLUE CROSS/BLUE SHIELD | Admitting: Internal Medicine

## 2016-01-15 ENCOUNTER — Encounter: Payer: Self-pay | Admitting: Internal Medicine

## 2016-01-15 VITALS — BP 126/84 | Temp 99.0°F | Ht 72.5 in | Wt 216.0 lb

## 2016-01-15 DIAGNOSIS — IMO0001 Reserved for inherently not codable concepts without codable children: Secondary | ICD-10-CM

## 2016-01-15 DIAGNOSIS — I10 Essential (primary) hypertension: Secondary | ICD-10-CM | POA: Diagnosis not present

## 2016-01-15 DIAGNOSIS — F17200 Nicotine dependence, unspecified, uncomplicated: Secondary | ICD-10-CM | POA: Diagnosis not present

## 2016-01-15 DIAGNOSIS — E1165 Type 2 diabetes mellitus with hyperglycemia: Secondary | ICD-10-CM

## 2016-01-15 DIAGNOSIS — R059 Cough, unspecified: Secondary | ICD-10-CM

## 2016-01-15 DIAGNOSIS — Z0000 Encounter for general adult medical examination without abnormal findings: Secondary | ICD-10-CM | POA: Diagnosis not present

## 2016-01-15 DIAGNOSIS — Z23 Encounter for immunization: Secondary | ICD-10-CM | POA: Diagnosis not present

## 2016-01-15 DIAGNOSIS — E785 Hyperlipidemia, unspecified: Secondary | ICD-10-CM | POA: Diagnosis not present

## 2016-01-15 DIAGNOSIS — R05 Cough: Secondary | ICD-10-CM

## 2016-01-15 DIAGNOSIS — Z8719 Personal history of other diseases of the digestive system: Secondary | ICD-10-CM

## 2016-01-20 ENCOUNTER — Other Ambulatory Visit: Payer: Self-pay | Admitting: Internal Medicine

## 2016-01-21 NOTE — Telephone Encounter (Signed)
Sent to the pharmacy by e-scribe. 

## 2016-04-08 NOTE — Progress Notes (Signed)
Document opened and reviewed for wellness visit . No showed .

## 2016-04-09 ENCOUNTER — Encounter: Payer: BLUE CROSS/BLUE SHIELD | Admitting: Internal Medicine

## 2016-06-10 ENCOUNTER — Other Ambulatory Visit: Payer: Self-pay | Admitting: Internal Medicine

## 2016-07-11 ENCOUNTER — Other Ambulatory Visit: Payer: Self-pay | Admitting: Internal Medicine

## 2016-07-14 ENCOUNTER — Telehealth: Payer: Self-pay | Admitting: Family Medicine

## 2016-07-14 NOTE — Telephone Encounter (Signed)
lmom for pt to call back

## 2016-07-14 NOTE — Telephone Encounter (Signed)
Pt is past due for diabetes check up.  Please schedule ASAP.  Thanks!!!

## 2016-07-14 NOTE — Telephone Encounter (Signed)
Message sent to scheduling.  Pt is past due for diabetes check up. Due in July.

## 2016-07-17 NOTE — Telephone Encounter (Signed)
lmom for pt to call back

## 2016-07-17 NOTE — Telephone Encounter (Signed)
Pt has been sch

## 2016-07-21 ENCOUNTER — Encounter: Payer: Self-pay | Admitting: Internal Medicine

## 2016-07-21 ENCOUNTER — Ambulatory Visit (INDEPENDENT_AMBULATORY_CARE_PROVIDER_SITE_OTHER): Payer: BLUE CROSS/BLUE SHIELD | Admitting: Internal Medicine

## 2016-07-21 VITALS — BP 154/80 | Temp 98.0°F | Wt 216.6 lb

## 2016-07-21 DIAGNOSIS — E1165 Type 2 diabetes mellitus with hyperglycemia: Secondary | ICD-10-CM | POA: Diagnosis not present

## 2016-07-21 DIAGNOSIS — E785 Hyperlipidemia, unspecified: Secondary | ICD-10-CM

## 2016-07-21 DIAGNOSIS — F17200 Nicotine dependence, unspecified, uncomplicated: Secondary | ICD-10-CM | POA: Diagnosis not present

## 2016-07-21 DIAGNOSIS — I10 Essential (primary) hypertension: Secondary | ICD-10-CM

## 2016-07-21 DIAGNOSIS — Z2821 Immunization not carried out because of patient refusal: Secondary | ICD-10-CM

## 2016-07-21 DIAGNOSIS — Z8719 Personal history of other diseases of the digestive system: Secondary | ICD-10-CM

## 2016-07-21 DIAGNOSIS — IMO0001 Reserved for inherently not codable concepts without codable children: Secondary | ICD-10-CM

## 2016-07-21 LAB — POCT GLYCOSYLATED HEMOGLOBIN (HGB A1C): Hemoglobin A1C: 12.2

## 2016-07-21 MED ORDER — AMLODIPINE BESY-BENAZEPRIL HCL 5-20 MG PO CAPS: 1.0000 | ORAL_CAPSULE | Freq: Every day | ORAL | 5 refills | Status: DC

## 2016-07-21 MED ORDER — GLIMEPIRIDE 4 MG PO TABS
2.0000 mg | ORAL_TABLET | Freq: Every day | ORAL | 2 refills | Status: DC
Start: 2016-07-21 — End: 2016-12-08

## 2016-07-21 NOTE — Patient Instructions (Addendum)
bp is hiogh today   Change medication to combination medication Will refill the cholesterol medication.  The jardiance is a good medicine but if  You cant afford this other optinos may still be   Expensive  .   Can add  A group of meds that can lower bg but  Can also cause  Hypoglycemia weight gain and eventually stop working.    Your diabetes is out of control if it stays that way will affect her blood vessels at risk for heart attack stroke and kidney disease and amputations over time. Since you don't want to start on insulin and oral medicine that may cost too myuch  or more we can try the older medicines. You need to check your blood sugar twice a day and write it down. You need to follow-up visit in about 6-8 weeks with your blood sugar readings to make a decision about other treatments. For medication adjustments. I think he would benefit from seeing an endocrinologist who is a specialist in diabetes about next steps if we are having a hard time controlling the blood sugar.

## 2016-07-21 NOTE — Progress Notes (Signed)
Chief Complaint  Patient presents with  . Follow-up    HPI: Todd Rodriguez 40 y.o. comes in today for chronic disease management. Hypertension: Taking medication lisinopril without side effects thinks his blood pressures okay but not taking his readings. Stop the diuretic because it causes leg cramps. Diabetes: Took the majority hands for about 3 months the because it costs $30 a month can afford it even with the coupon. Doesn't check his sugars very much did check a couple weeks ago onset it was 90. No major change in vision. Taking the equivalent of 2000 mg metformin a day. Numbness tingling extremity symptoms. No recurrence of pancreatitis rare use of alcohol. Tobacco still smoking 1 pack per day. 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 Declines flu vaccine today doesn't think he needs it doesn't get the flu. ROS: See pertinent positives and negatives per HPI.  Past Medical History:  Diagnosis Date  . Diabetes mellitus without complication ([place])   . GERD (gastroesophageal reflux disease)   . Hypertension   . Pancreatitis     Family History  Problem Relation Age of Onset  . Colon cancer Neg Hx   . Pancreatitis Neg Hx   . Pancreatic cancer Neg Hx   . Diabetes Mother   . Hypertension Mother   . Diabetes Father   . Hypertension Father   . Arthritis Maternal Grandmother   . Hypertension Maternal Grandfather   . Diabetes Maternal Grandfather     Social History   Social History  . Marital status: Married    Spouse name: N/A  . Number of children: 1  . Years of education: N/A   Occupational History  .  [employer] Main Topics  . Smoking status: Current Every Day Smoker    Packs/day: 1.00  . Smokeless tobacco: None  . Alcohol use Yes     Comment: every weekend  . Drug use: No  . Sexual activity: Yes    Partners: Female     Comment: Patient's Wife   Other Topics Concern  . 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 atorvastatin (LIPITOR) 20 MG tablet TAKE 1 TABLET (20 MG TOTAL) BY MOUTH DAILY. 90 tablet 0  . empagliflozin (JARDIANCE) 10 MG TABS tablet Take 10 mg by mouth daily. 30 tablet 12  . glucose blood (ONE TOUCH TEST STRIPS) test strip Use as instructed 100 each 11  . lisinopril (PRINIVIL,ZESTRIL) 20 MG tablet TAKE 1 TABLET (20 MG TOTAL) BY MOUTH DAILY. 90 tablet 2  . metFORMIN (GLUCOPHAGE-XR) 500 MG 24 hr tablet TAKE 2 TABLETS (1,000 MG TOTAL) BY MOUTH 2 (TWO) TIMES DAILY. 360 tablet 0  . pantoprazole (PROTONIX) 40 MG tablet Take 1 tablet (40 mg total) by mouth 2 (two) times daily. (Patient taking differently: Take 40 mg by mouth daily. ) 180 tablet 1  . pantoprazole (PROTONIX) 40 MG tablet TAKE 1 TABLET BY MOUTH TWICE A DAY 180 tablet 2  .  tadalafil (CIALIS) 20 MG tablet Take 1 tablet (20 mg total) by mouth daily as needed for erectile dysfunction. 10 tablet 3  . HYDROcodone-acetaminophen (NORCO/VICODIN) 5-325 MG tablet Take 1 tablet by mouth every 6 (six) hours as needed. 20 tablet 0   No facility-administered medications prior to visit.      EXAM:  BP (!) 154/80   Temp 98 F (36.7 C) (Oral)   Wt 216 lb 9.6 oz (98.2 kg)   BMI 28.97 kg/m   Body mass index is 28.97 kg/m.  GENERAL: vitals reviewed and listed above, alert, oriented, appears well hydrated and in no acute distress HEENT: atraumatic, conjunctiva  clear, no obvious abnormalities on inspection of external nose and ears OP : no lesion edema or exudate   NECK: no obvious masses on inspection palpation  LUNGS: clear to auscultation bilaterally, no wheezes, rales or rhonchi, good air movement CV:  HRRR, no clubbing cyanosis or  peripheral edema nl cap refill  MS: moves all extremities without noticeable focal  Abnormality    Diabetic Foot Exam - Simple   Simple Foot Form Diabetic Foot exam was performed with the following findings:  Yes 07/21/2016  2:20 PM  Visual Inspection No deformities, no ulcerations, no other skin breakdown bilaterally:  Yes Sensation Testing Intact to touch and monofilament testing bilaterally:  Yes Pulse Check Posterior Tibialis and Dorsalis pulse intact bilaterally:  Yes Comments     PSYCH: pleasant and cooperative, no obvious depression or anxiety Lab Results  Component Value Date   WBC 5.8 01/07/2016   HGB 15.0 01/07/2016   HCT 43.1 01/07/2016   PLT 264.0 01/07/2016   GLUCOSE 175 (H) 01/07/2016   CHOL 191 01/07/2016   TRIG 215.0 (H) 01/07/2016   HDL 40.80 01/07/2016   LDLDIRECT 115.0 01/07/2016   LDLCALC 50 04/12/2014   ALT 23 01/07/2016   AST 20 01/07/2016   NA 140 01/07/2016   K 4.0 01/07/2016   CL 100 01/07/2016   CREATININE 1.10 01/07/2016   BUN 8 01/07/2016   CO2 32 01/07/2016   TSH 1.39 01/07/2016   PSA 0.76 01/07/2016   HGBA1C 12.2 07/21/2016   MICROALBUR <0.7 10/09/2015   Wt Readings from Last 3 Encounters:  07/21/16 216 lb 9.6 oz (98.2 kg)  01/15/16 216 lb (98 kg)  10/28/15 221 lb 8 oz (100.5 kg)     ASSESSMENT AND PLAN:  Discussed the following assessment and plan:  Uncontrolled type 2 diabetes mellitus without complication, without long-term current use of insulin ([place]) - Plan: POCT A1C  Tobacco dependence  Essential hypertension, benign  Hyperlipidemia, unspecified hyperlipidemia type  Hx of acute pancreatitis - stable however limits using glp1 meds  Influenza vaccination declined by patient  Shared Decision Making  Stopped the jardiance medication because $30 a month was too expensive. Discussed insulin other medications. He states he is not ready for an injection but did discuss basals insulin. His  diabetes is so out of control consideration of sending him to endocrinologist. His work schedule finances however limiting. Warned him that if not under control he can have severe consequences to his health in the future. Currently appears he is not having complications which is fortunate. Although I have reservations will begin Amaryl 2 mg a day consideration of increasing to 4 mg a day but he must check his sugars follow-up in 6-8 weeks. Blood pressure not optimal control. Had side effects of diuretics so will add a CBE change to Lotrel 520 and follow-up at next visit.  He is to stay on the metformin. Refill statin medicine. -Patient advised to return or notify health care team  if symptoms worsen ,persist or new concerns arise.  Patient Instructions  bp is hiogh today   Change medication to combination medication Will refill the cholesterol medication.  The jardiance is a good medicine but if  You cant afford this other optinos may still be   Expensive  .   Can add  A group of meds that can lower bg but  Can also cause  Hypoglycemia weight gain and eventually stop working.    Your diabetes is out of control if it stays that way will affect her blood vessels at risk for heart attack stroke and kidney disease and amputations over time. Since you don't want to start on insulin and oral medicine that may cost too myuch  or more we can try the older medicines. You need to check your blood sugar twice a day and write it down. You need to follow-up visit in about 6-8 weeks with your blood sugar readings to make a decision about other treatments. For medication adjustments. I think he would benefit from seeing an endocrinologist who is a specialist in diabetes about next steps if we are having a hard time controlling the blood sugar.     Standley Brooking. Panosh M.D.

## 2016-07-21 NOTE — Telephone Encounter (Signed)
Ok to refill x 1  Is on schedule for fu

## 2016-07-22 ENCOUNTER — Encounter: Payer: Self-pay | Admitting: Internal Medicine

## 2016-09-29 ENCOUNTER — Telehealth: Payer: Self-pay | Admitting: Family Medicine

## 2016-09-29 ENCOUNTER — Ambulatory Visit: Payer: BLUE CROSS/BLUE SHIELD | Admitting: Internal Medicine

## 2016-09-29 DIAGNOSIS — Z0289 Encounter for other administrative examinations: Secondary | ICD-10-CM

## 2016-09-29 NOTE — Telephone Encounter (Signed)
Pt missed appointment today with Dr. Regis Bill.  Left a message for the pt to return call to office.  Needs to be rescheduled.

## 2016-09-29 NOTE — Progress Notes (Deleted)
No chief complaint on file.   HPI: Todd Rodriguez 40 y.o.   Fu visit for  Non controlled dm  ROS: See pertinent positives and negatives per HPI.  Past Medical History:  Diagnosis Date  . Diabetes mellitus without complication ([place])   . GERD (gastroesophageal reflux disease)   . Hypertension   . Pancreatitis     Family History  Problem Relation Age of Onset  . Colon cancer Neg Hx   . Pancreatitis Neg Hx   . Pancreatic cancer Neg Hx   . Diabetes Mother   . Hypertension Mother   . Diabetes Father   . Hypertension Father   . Arthritis Maternal Grandmother   . Hypertension Maternal Grandfather   . Diabetes Maternal Grandfather     Social History   Social History  . Marital status: Married    Spouse name: N/A  . Number of children: 1  . Years of education: N/A   Occupational History  .  [place] History Main Topics  . Smoking status: Current Every Day Smoker    Packs/day: 1.00  . Smokeless tobacco: Not on file  . Alcohol use Yes     Comment: every weekend  . Drug use: No  . Sexual activity: Yes    Partners: Female     Comment: Patient's Wife   Other Topics Concern  . Not on file   Social History Narrative   5 hours of sleep    2 people living in the home   A dog living in the home   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   Eye exam nany clark    FA tobacco some etoh neg rd exercises      Plays drummer in a band                 Outpatient Medications Prior to Visit  Medication Sig Dispense Refill  . amLODipine-benazepril (LOTREL) 5-20 MG capsule Take 1 capsule by mouth daily. 30 capsule 5  . atorvastatin (LIPITOR) 20 MG tablet TAKE 1 TABLET (20 MG TOTAL) BY MOUTH DAILY. 90 tablet 0  . empagliflozin (JARDIANCE) 10 MG TABS tablet Take 10 mg by mouth daily. 30 tablet 12  . glimepiride (AMARYL) 4 MG tablet Take 0.5 tablets (2 mg total) by mouth daily before breakfast. Can increase  As directed 30  tablet 2  . glucose blood (ONE TOUCH TEST STRIPS) test strip Use as instructed 100 each 11  . lisinopril (PRINIVIL,ZESTRIL) 20 MG tablet TAKE 1 TABLET (20 MG TOTAL) BY MOUTH DAILY. 90 tablet 2  . metFORMIN (GLUCOPHAGE-XR) 500 MG 24 hr tablet TAKE 2 TABLETS (1,000 MG TOTAL) BY MOUTH 2 (TWO) TIMES DAILY. 360 tablet 0  . pantoprazole (PROTONIX) 40 MG tablet Take 1 tablet (40 mg total) by mouth 2 (two) times daily. (Patient taking differently: Take 40 mg by mouth daily. ) 180 tablet 1  . pantoprazole (PROTONIX) 40 MG tablet TAKE 1 TABLET BY MOUTH TWICE A DAY 180 tablet 2  . tadalafil (CIALIS) 20 MG tablet Take 1 tablet (20 mg total) by mouth daily as needed for erectile dysfunction. 10 tablet 3   No facility-administered medications prior to visit.      EXAM:  There were no vitals taken for this visit.  There is no height or weight on file to calculate BMI.  GENERAL: vitals reviewed and listed above, alert, oriented, appears well hydrated and in no acute distress HEENT: atraumatic,  conjunctiva  clear, no obvious abnormalities on inspection of external nose and ears OP : no lesion edema or exudate  NECK: no obvious masses on inspection palpation  LUNGS: clear to auscultation bilaterally, no wheezes, rales or rhonchi, good air movement CV: HRRR, no clubbing cyanosis or  peripheral edema nl cap refill  MS: moves all extremities without noticeable focal  abnormality PSYCH: pleasant and cooperative, no obvious depression or anxiety Lab Results  Component Value Date   WBC 5.8 01/07/2016   HGB 15.0 01/07/2016   HCT 43.1 01/07/2016   PLT 264.0 01/07/2016   GLUCOSE 175 (H) 01/07/2016   CHOL 191 01/07/2016   TRIG 215.0 (H) 01/07/2016   HDL 40.80 01/07/2016   LDLDIRECT 115.0 01/07/2016   LDLCALC 50 04/12/2014   ALT 23 01/07/2016   AST 20 01/07/2016   NA 140 01/07/2016   K 4.0 01/07/2016   CL 100 01/07/2016   CREATININE 1.10 01/07/2016   BUN 8 01/07/2016   CO2 32 01/07/2016   TSH 1.39  01/07/2016   PSA 0.76 01/07/2016   HGBA1C 12.2 07/21/2016   MICROALBUR <0.7 10/09/2015   BP Readings from Last 3 Encounters:  07/21/16 (!) 154/80  01/15/16 126/84  10/28/15 136/88   Wt Readings from Last 3 Encounters:  07/21/16 216 lb 9.6 oz (98.2 kg)  01/15/16 216 lb (98 kg)  10/28/15 221 lb 8 oz (100.5 kg)    ASSESSMENT AND PLAN:  Discussed the following assessment and plan:  No diagnosis found.  -Patient advised to return or notify health care team  if symptoms worsen ,persist or new concerns arise.  There are no Patient Instructions on file for this visit.   Standley Brooking. Panosh M.D. Uncontrolled type 2 diabetes mellitus without complication, without long-term current use of insulin ([place]) - Plan: POCT A1C  Tobacco dependence  Essential hypertension, benign  Hyperlipidemia, unspecified hyperlipidemia type  Hx of acute pancreatitis - stable however limits using glp1 meds  Influenza vaccination declined by patient  Shared Decision Making  Stopped the jardiance medication because $30 a month was too expensive. Discussed insulin other medications. He states he is not ready for an injection but did discuss basals insulin. His diabetes is so out of control consideration of sending him to endocrinologist. His work schedule finances however limiting. Warned him that if not under control he can have severe consequences to his health in the future. Currently appears he is not having complications which is fortunate. Although I have reservations will begin Amaryl 2 mg a day consideration of increasing to 4 mg a day but he must check his sugars follow-up in 6-8 weeks. Blood pressure not optimal control. Had side effects of diuretics so will add a CBE change to Lotrel 520 and follow-up at next visit. He is to stay on the metformin. Refill statin medicine. -Patient advised to return or notify health care team  if symptoms worsen ,persist or new concerns arise.  Patient  Instructions  bp is hiogh today   Change medication to combination medication Will refill the cholesterol medication.  The jardiance is a good medicine but if  You cant afford this other optinos may still be   Expensive  .   Can add  A group of meds that can lower bg but  Can also cause  Hypoglycemia weight gain and eventually stop working.    Your diabetes is out of control if it stays that way will affect her blood vessels at risk for heart attack stroke and  kidney disease and amputations over time. Since you don't want to start on insulin and oral medicine that may cost too myuch  or more we can try the older medicines. You need to check your blood sugar twice a day and write it down. You need to follow-up visit in about 6-8 weeks with your blood sugar readings to make a decision about other treatments. For medication adjustments. I think he would benefit from seeing an endocrinologist who is a specialist in diabetes about next steps if we are having a hard time controlling the blood sugar.

## 2016-11-01 IMAGING — CR DG CHEST 2V
2 series · 2 of 2 positions shown · non-contrast
Comparison: 03/26/2014.

CLINICAL DATA: Cough.

EXAM:
CHEST  2 VIEW

[view not recorded (1 of 2)]
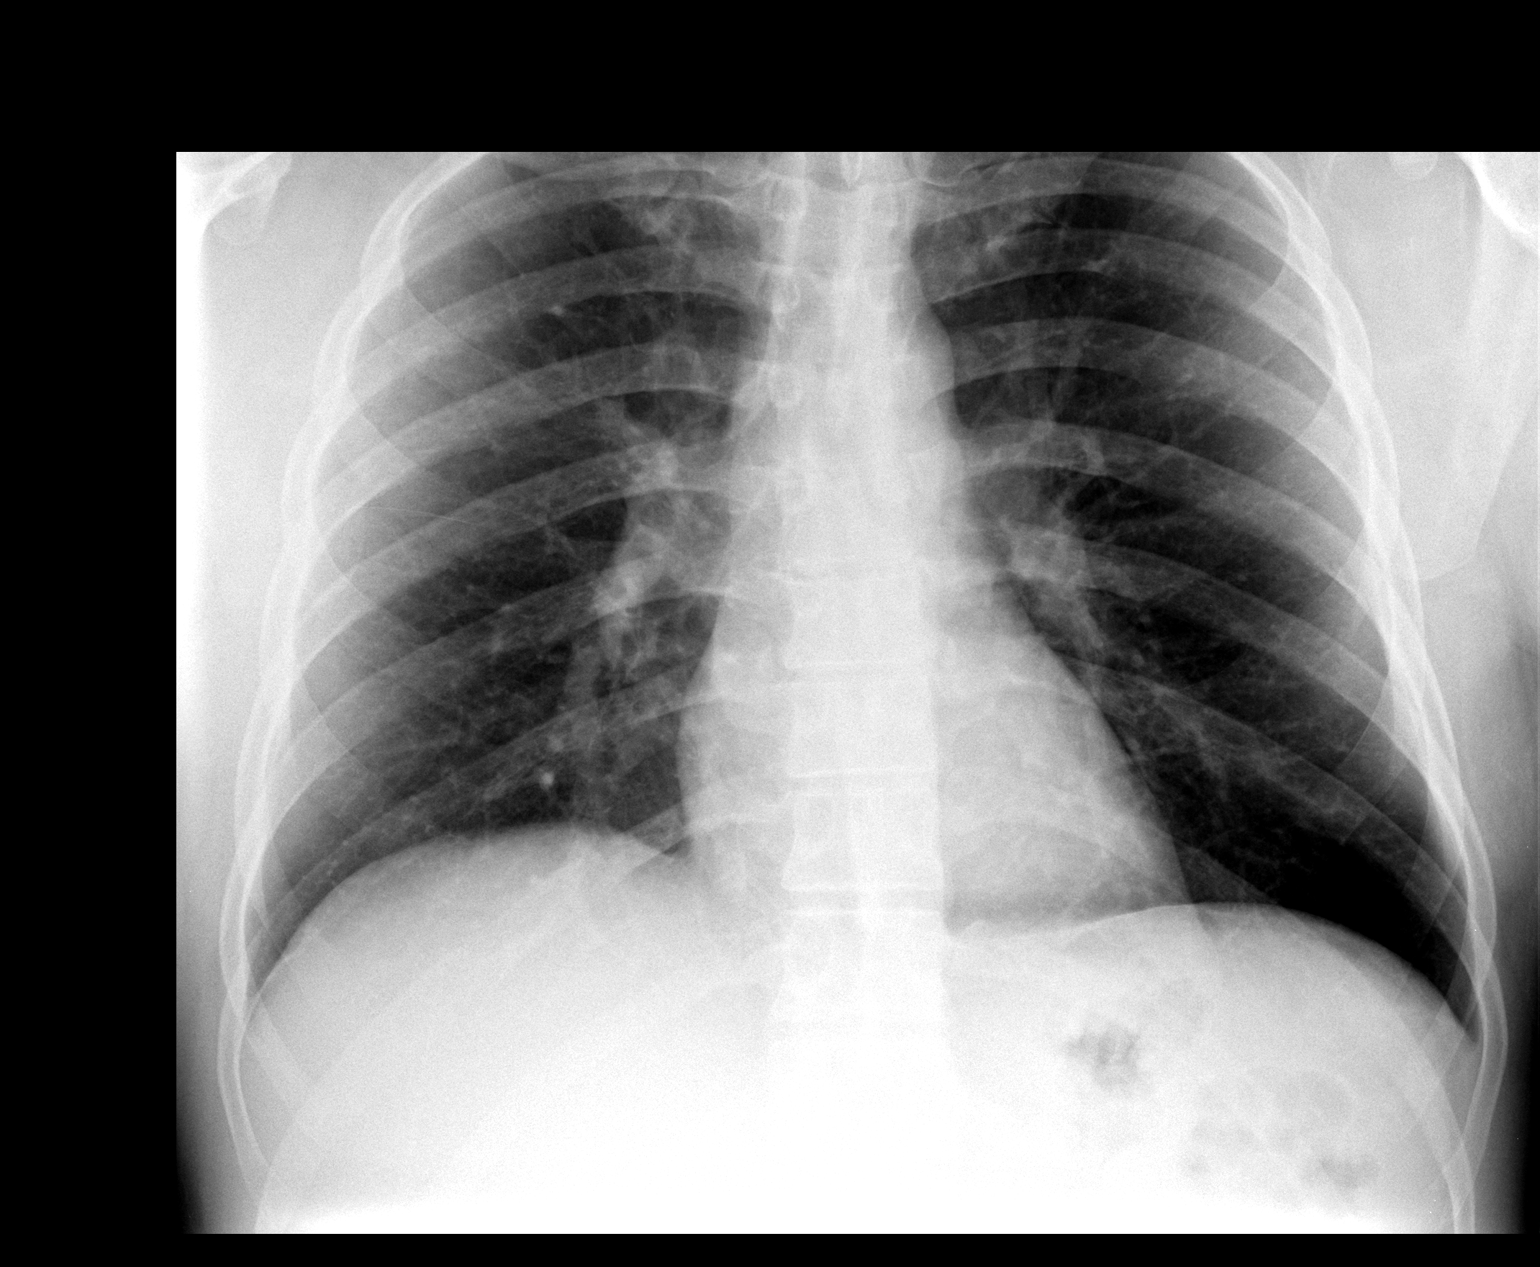

[view not recorded (2 of 2)]
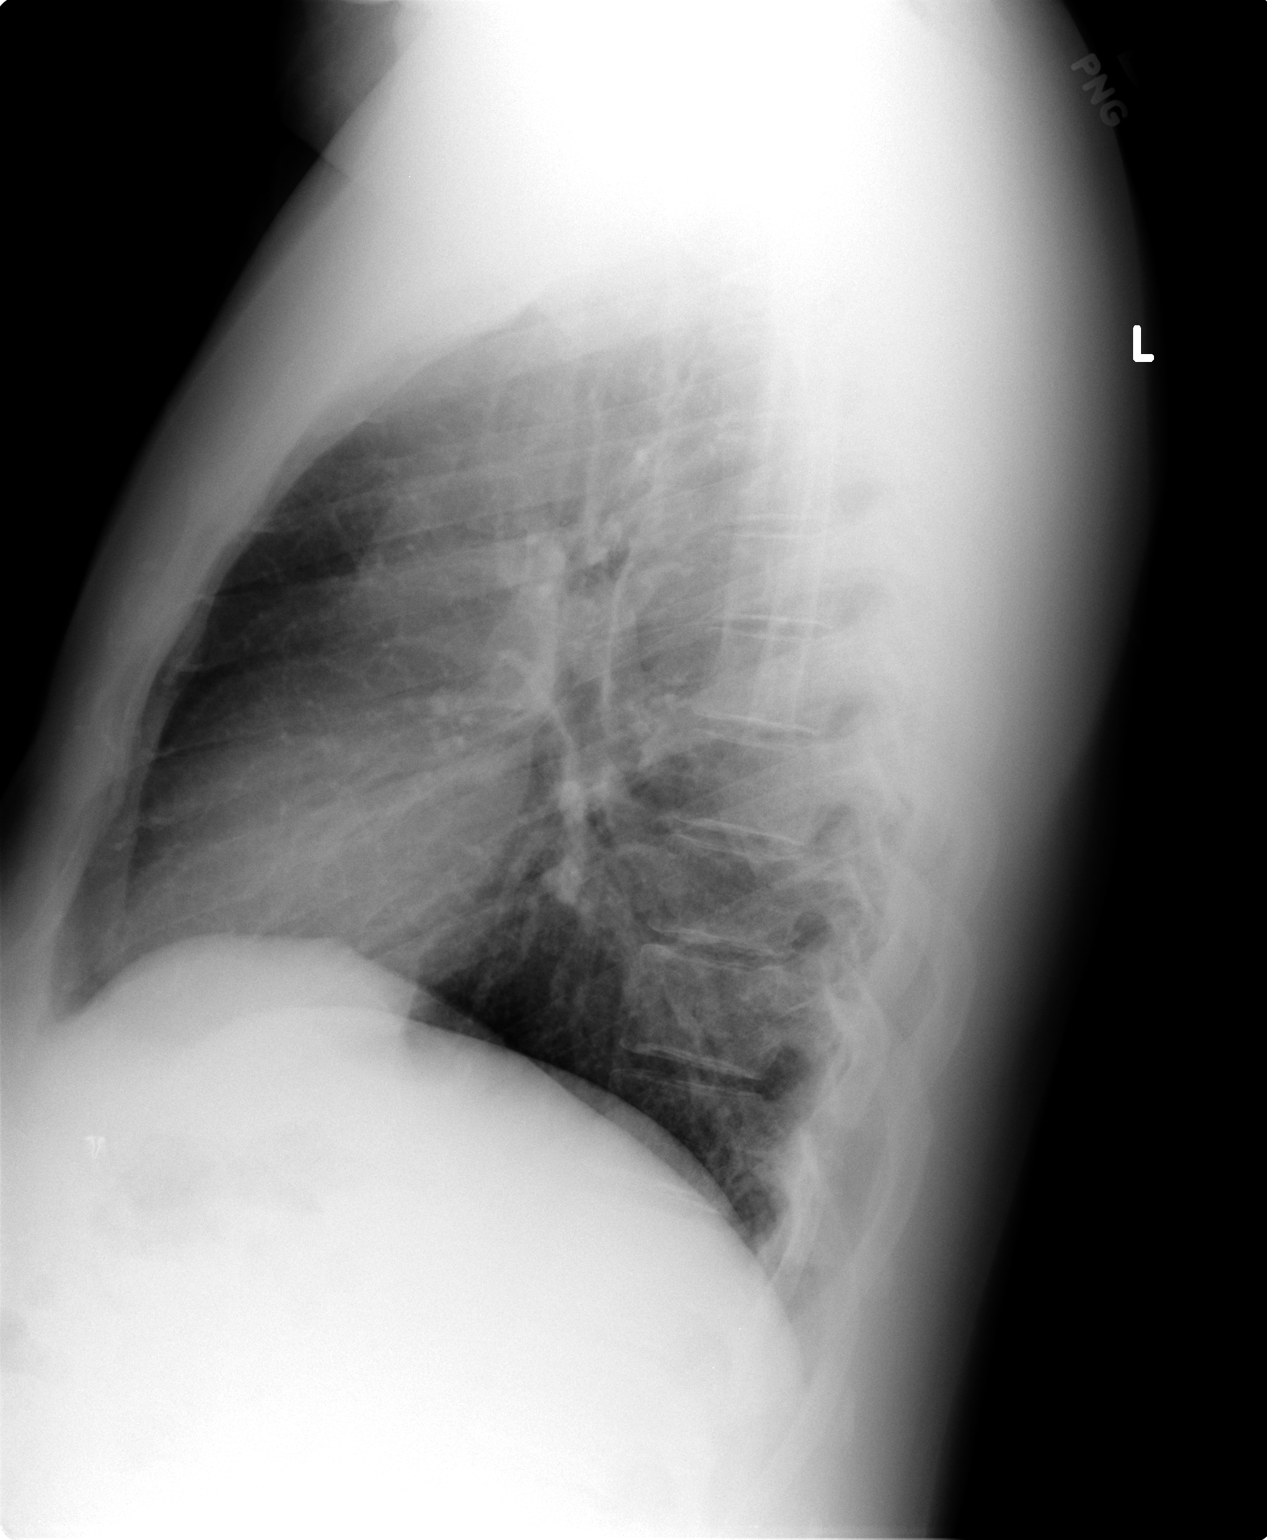

[2 of 2 positions shown; findings below may reference images not displayed]

FINDINGS: Mediastinum and hilar structures normal. Lungs are clear. Heart size
normal. No pleural effusion or pneumothorax.
IMPRESSION: No acute cardiopulmonary disease.

## 2016-11-21 ENCOUNTER — Other Ambulatory Visit: Payer: Self-pay | Admitting: Internal Medicine

## 2016-11-24 ENCOUNTER — Telehealth: Payer: Self-pay | Admitting: Family Medicine

## 2016-11-24 NOTE — Telephone Encounter (Signed)
Sent to the pharmacy by e-scribe for #60.  Message sent to scheduling.

## 2016-11-24 NOTE — Telephone Encounter (Signed)
Pt needs appt within the next 60 days.  Dr. Regis Bill requests 30 minutes.  Please help schedule.  Thanks!!!

## 2016-11-24 NOTE — Telephone Encounter (Signed)
Can refill 60 days    Due for  Lab monitoring and fu  I know he works extra jopb hours and nights  Missed last appt NS last appt    Please arrange an OV extended 30 minutes at ime that fits  his  Schedule  And we can do lab at the visit   Lab Results  Component Value Date   WBC 5.8 01/07/2016   HGB 15.0 01/07/2016   HCT 43.1 01/07/2016   PLT 264.0 01/07/2016   GLUCOSE 175 (H) 01/07/2016   CHOL 191 01/07/2016   TRIG 215.0 (H) 01/07/2016   HDL 40.80 01/07/2016   LDLDIRECT 115.0 01/07/2016   LDLCALC 50 04/12/2014   ALT 23 01/07/2016   AST 20 01/07/2016   NA 140 01/07/2016   K 4.0 01/07/2016   CL 100 01/07/2016   CREATININE 1.10 01/07/2016   BUN 8 01/07/2016   CO2 32 01/07/2016   TSH 1.39 01/07/2016   PSA 0.76 01/07/2016   HGBA1C 12.2 07/21/2016   MICROALBUR <0.7 10/09/2015

## 2016-11-25 NOTE — Telephone Encounter (Signed)
lmom for pt to call back

## 2016-11-27 NOTE — Telephone Encounter (Signed)
lmom for pt to call back

## 2016-11-30 NOTE — Telephone Encounter (Signed)
Pt has been rescheduled for Tues,  March 6 at 2:30.

## 2016-11-30 NOTE — Telephone Encounter (Signed)
lmom for pt to callblack

## 2016-12-08 ENCOUNTER — Encounter: Payer: Self-pay | Admitting: Internal Medicine

## 2016-12-08 ENCOUNTER — Ambulatory Visit (INDEPENDENT_AMBULATORY_CARE_PROVIDER_SITE_OTHER): Payer: BLUE CROSS/BLUE SHIELD | Admitting: Internal Medicine

## 2016-12-08 VITALS — BP 144/88 | HR 92 | Temp 98.4°F | Ht 72.0 in | Wt 224.0 lb

## 2016-12-08 DIAGNOSIS — I10 Essential (primary) hypertension: Secondary | ICD-10-CM | POA: Diagnosis not present

## 2016-12-08 DIAGNOSIS — R202 Paresthesia of skin: Secondary | ICD-10-CM

## 2016-12-08 DIAGNOSIS — R200 Anesthesia of skin: Secondary | ICD-10-CM

## 2016-12-08 DIAGNOSIS — F17200 Nicotine dependence, unspecified, uncomplicated: Secondary | ICD-10-CM | POA: Diagnosis not present

## 2016-12-08 DIAGNOSIS — E1165 Type 2 diabetes mellitus with hyperglycemia: Secondary | ICD-10-CM

## 2016-12-08 DIAGNOSIS — IMO0001 Reserved for inherently not codable concepts without codable children: Secondary | ICD-10-CM

## 2016-12-08 LAB — POCT GLYCOSYLATED HEMOGLOBIN (HGB A1C): Hemoglobin A1C: 9.4

## 2016-12-08 MED ORDER — GLIMEPIRIDE 4 MG PO TABS: 4.0000 mg | ORAL_TABLET | Freq: Every day | ORAL | 3 refills | Status: DC

## 2016-12-08 MED ORDER — BUPROPION HCL ER (XL) 150 MG PO TB24: 150.0000 mg | ORAL_TABLET | Freq: Every day | ORAL | 3 refills | Status: DC

## 2016-12-08 NOTE — Patient Instructions (Addendum)
   Your blood sugar is too high.   Stay on the metformin.  Increase the glimepramide to to 4 mg a day .   After  A month increase to 6 mg per day   There are many things to try for diabetes but because of your history of pancreatitis want to avoid certain medicines.  I would like to see a diabetes specialist and I will put that on your FMLA form also . And refer for appt.   We'll arrange a referral in regard to your right hand numbness. You can change the appointment is not convenient.  Stop smoking and use nicotine patches and try Wellbutrin in addition.  Your blood pressure is also elevated today continue your medicines lower the salt in your diet.  We'll try to complete the FMLA form in regard to your past history of pancreatitis in your diabetes.  ROV in   2  months  Can do labs at the visit  Or later if seeing Diabetes specialist   Goal fasting below 120 Goal 2 hours after eating  Below 180  Acceptable but best 140- 150 and below

## 2016-12-08 NOTE — Progress Notes (Signed)
Chief Complaint  Patient presents with  . Diabetes    fmla form  tingling meds     HPI: Todd Rodriguez 41 y.o. come in for Chronic disease management   Last visit 10 17 and delayed  Planned fu   Last cpx 4 17   He comes in today noting that his blood sugar is up has been checking his readings recently and the lowest was 170 often in the 200s.  He is only taking 2 mg of Amaryl and 2000 mg of metformin. In the past to stop the chart he has because of cost and had initially declined insulin shots are expensive interventions but because his sugar is up now is willing to try other methods.  The last couple months his right hand has been tingling and numb this is different then when he had radicular symptoms from his neck that went to his middle 2 fingers. At that time he saw a specialist in [place] who is no longer there.  Denies any traumas hand he is right-handed not really week wonders if it's from the diabetes. Denies tingling on his feet.  Still smoking took the Chantix but had really bad dreams wants to stop tobacco ,willing to try the patch.  Taking blood pressure medicine every day compliant.  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  Minimal to no alcohol no flareup of his pancreatitis.  He has an FMLA form today his previous one that we write for his pancreatitis is expired needs to be renewed.  Still taking the Protonix once a day.   ROS: See pertinent positives and negatives per HPI. No cp sob fever  Neck pain now  Feet issues   Past Medical History:  Diagnosis Date  . Diabetes mellitus without complication ([place])   . GERD (gastroesophageal reflux disease)   . Hypertension   . Pancreatitis     Family History  Problem Relation Age of Onset  . Colon cancer Neg Hx   . Pancreatitis Neg Hx   . Pancreatic cancer Neg Hx   . Diabetes Mother   . Hypertension Mother   . Diabetes Father   . Hypertension Father   . Arthritis Maternal  Grandmother   . Hypertension Maternal Grandfather   . Diabetes Maternal Grandfather     Social History   Social History  . Marital status: Married    Spouse name: N/A  . Number of children: 1  . Years of education: N/A   Occupational History  .  [place] History Main Topics  . Smoking status: Current Every Day Smoker    Packs/day: 1.00  . Smokeless tobacco: Never Used  . Alcohol use Yes     Comment: every weekend  . Drug use: No  . Sexual activity: Yes    Partners: Female     Comment: Patient's Wife   Other Topics Concern  . 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 amLODipine-benazepril (  LOTREL) 5-20 MG capsule Take 1 capsule by mouth daily. 30 capsule 5  . atorvastatin (LIPITOR) 20 MG tablet TAKE 1 TABLET (20 MG TOTAL) BY MOUTH DAILY. 90 tablet 0  . glucose blood (ONE TOUCH TEST STRIPS) test strip Use as instructed 100 each 11  . metFORMIN (GLUCOPHAGE-XR) 500 MG 24 hr tablet TAKE 2 TABLETS (1,000 MG TOTAL) BY MOUTH 2 (TWO) TIMES DAILY. 360 tablet 0  . pantoprazole (PROTONIX) 40 MG tablet Take 1 tablet (40 mg total) by mouth 2 (two) times daily. (Patient taking differently: Take 40 mg by mouth daily. ) 180 tablet 1  . pantoprazole (PROTONIX) 40 MG tablet TAKE 1 TABLET BY MOUTH TWICE A DAY 180 tablet 2  . tadalafil (CIALIS) 20 MG tablet Take 1 tablet (20 mg total) by mouth daily as needed for erectile dysfunction. 10 tablet 3  . glimepiride (AMARYL) 4 MG tablet Take 0.5 tablets (2 mg total) by mouth daily before breakfast. Can increase  As directed 30 tablet 2  . empagliflozin (JARDIANCE) 10 MG TABS tablet Take 10 mg by mouth  daily. (Patient not taking: Reported on 12/08/2016) 30 tablet 12  . lisinopril (PRINIVIL,ZESTRIL) 20 MG tablet TAKE 1 TABLET (20 MG TOTAL) BY MOUTH DAILY. (Patient not taking: Reported on 12/08/2016) 60 tablet 0   No facility-administered medications prior to visit.      EXAM:  BP (!) 144/88 (BP Location: Right Arm)   Pulse 92   Temp 98.4 F (36.9 C) (Oral)   Ht 6' (1.829 m)   Wt 224 lb (101.6 kg)   BMI 30.38 kg/m   Body mass index is 30.38 kg/m.  GENERAL: vitals reviewed and listed above, alert, oriented, appears well hydrated and in no acute distress HEENT: atraumatic, conjunctiva  clear, no obvious abnormalities on inspection of external nose and ears NECK: no obvious masses on inspection palpation  LUNGS: clear to auscultation bilaterally, no wheezes, rales or rhonchi, good air movement CV: HRRR, no clubbing cyanosis or  peripheral edema nl cap refill  MS: moves all extremities without noticeable focal  Abnormality Right hand has normal pulses and perfusion. Grip strength appears to be normal as well as interosseous muscles subjective tingling on all fingers except for the palm. No obvious atrophy. PSYCH: pleasant and cooperative, no obvious depression   Lab Results  Component Value Date   WBC 5.8 01/07/2016   HGB 15.0 01/07/2016   HCT 43.1 01/07/2016   PLT 264.0 01/07/2016   GLUCOSE 175 (H) 01/07/2016   CHOL 191 01/07/2016   TRIG 215.0 (H) 01/07/2016   HDL 40.80 01/07/2016   LDLDIRECT 115.0 01/07/2016   LDLCALC 50 04/12/2014   ALT 23 01/07/2016   AST 20 01/07/2016   NA 140 01/07/2016   K 4.0 01/07/2016   CL 100 01/07/2016   CREATININE 1.10 01/07/2016   BUN 8 01/07/2016   CO2 32 01/07/2016   TSH 1.39 01/07/2016   PSA 0.76 01/07/2016   HGBA1C 9.4 12/08/2016   MICROALBUR <0.7 10/09/2015   BP Readings from Last 3 Encounters:  12/08/16 (!) 144/88  07/21/16 (!) 154/80  01/15/16 126/84   Wt Readings from Last 3 Encounters:  12/08/16 224 lb (101.6 kg)  07/21/16  216 lb 9.6 oz (98.2 kg)  01/15/16 216 lb (98 kg)   Lab Results  Component Value Date   HGBA1C 9.4 12/08/2016   HGBA1C 12.2 07/21/2016   HGBA1C 9.2 (H) 01/07/2016   Lab Results  Component Value Date   MICROALBUR <0.7 10/09/2015  [place] 50 04/12/2014   CREATININE 1.10 01/07/2016     ASSESSMENT AND PLAN:  Discussed the following assessment and plan:  Uncontrolled diabetes mellitus type 2 without complications, unspecified long term insulin use status ([facility]) - 9.2 downfrom 11.2 pt aware of goals - Plan: POC HgB A1c, Ambulatory referral to Endocrinology  Tobacco dependence  Essential hypertension, benign  Numbness and tingling in right hand - Plan: Ambulatory referral to Orthopedic Surgery He does night work. Needs FMLA form no recurrence of his pancreatitis at this time. He is willing to try other methods to get his blood sugar down at this time. He certainly may benefit from insulin and other meds that doesn't affect the pancreas. At this time however will increase his amaryl  to 4 mg after 3 weeks increase it to 6 mg stay on the metformin do a referral for diabetes specialist. He may be willing to go back on the charting events despite the $30 co-pay cost. Blood pressure was up today coming down after first reading. May need to intensify that medicine to follow up will recheck. Tobacco cessation trial had side effects with Chantix sending Wellbutrin and continue using nicotine patch when he stops In regard to the tingling in his right hand suspecting more of a compression neuropathy we'll do a local referral orthopedics  Patient aware if he can't keep appointment cc needs to send a message or colon cancer reschedule. Is been a challenge to manage his medicines recently because of lack of data and delayed follow-up however he does seem motivated. FMLA form  -Patient advised to return or notify health care team  if  new concerns arise.  Patient Instructions   Your blood sugar is  too high.   Stay on the metformin.  Increase the glimepramide to to 4 mg a day .   After  A month increase to 6 mg per day   There are many things to try for diabetes but because of your history of pancreatitis want to avoid certain medicines.  I would like to see a diabetes specialist and I will put that on your FMLA form also . And refer for appt.   We'll arrange a referral in regard to your right hand numbness. You can change the appointment is not convenient.  Stop smoking and use nicotine patches and try Wellbutrin in addition.  Your blood pressure is also elevated today continue your medicines lower the salt in your diet.  We'll try to complete the FMLA form in regard to your past history of pancreatitis in your diabetes.  ROV in   2  months  Can do labs at the visit  Or later if seeing Diabetes specialist   Goal fasting below 120 Goal 2 hours after eating  Below 180  Acceptable but best 140- 150 and below              Bearden K. Panosh M.D.

## 2016-12-17 ENCOUNTER — Ambulatory Visit (INDEPENDENT_AMBULATORY_CARE_PROVIDER_SITE_OTHER): Payer: BLUE CROSS/BLUE SHIELD | Admitting: Endocrinology

## 2016-12-17 ENCOUNTER — Encounter: Payer: Self-pay | Admitting: Endocrinology

## 2016-12-17 VITALS — BP 146/92 | HR 88 | Ht 72.0 in | Wt 217.0 lb

## 2016-12-17 DIAGNOSIS — I10 Essential (primary) hypertension: Secondary | ICD-10-CM | POA: Diagnosis not present

## 2016-12-17 DIAGNOSIS — E1165 Type 2 diabetes mellitus with hyperglycemia: Secondary | ICD-10-CM | POA: Diagnosis not present

## 2016-12-17 DIAGNOSIS — E782 Mixed hyperlipidemia: Secondary | ICD-10-CM

## 2016-12-17 LAB — COMPREHENSIVE METABOLIC PANEL
ALT: 22 U/L (ref 0–53)
AST: 16 U/L (ref 0–37)
Albumin: 4.5 g/dL (ref 3.5–5.2)
Alkaline Phosphatase: 65 U/L (ref 39–117)
BUN: 9 mg/dL (ref 6–23)
CO2: 28 meq/L (ref 19–32)
Calcium: 10 mg/dL (ref 8.4–10.5)
Chloride: 104 mEq/L (ref 96–112)
Creatinine, Ser: 1 mg/dL (ref 0.40–1.50)
GFR: 105.01 mL/min (ref >60.00)
GLUCOSE: 174 mg/dL — AB (ref 70–99)
POTASSIUM: 4.5 meq/L (ref 3.5–5.1)
Sodium: 138 mEq/L (ref 135–145)
Total Bilirubin: 0.4 mg/dL (ref 0.2–1.2)
Total Protein: 7.5 g/dL (ref 6.0–8.3)

## 2016-12-17 LAB — MICROALBUMIN / CREATININE URINE RATIO
Creatinine,U: 140.3 mg/dL
MICROALB/CREAT RATIO: 1.1 mg/g (ref 0.0–30.0)
Microalb, Ur: 1.5 mg/dL (ref 0.0–1.9)

## 2016-12-17 MED ORDER — EMPAGLIFLOZIN 10 MG PO TABS: 10.0000 mg | ORAL_TABLET | Freq: Every day | ORAL | 1 refills | Status: DC

## 2016-12-17 NOTE — Patient Instructions (Signed)
  Start using diet drinks and as much as possible avoid regular soft drinks and juice  Check blood sugars on waking up  2 or 3 times a week  Also check blood sugars about 2-3 hours after a meal and do this after different meals by rotation  Recommended blood sugar levels on waking up is 90-130 and about 2 hours after meal is 130-160  Please bring your blood sugar monitor to each visit, thank you  Start doing brisk walking for 30 minutes on  days off  JARDIANCE: Take it when you wake up  Continue metformin and glimepiride If the blood sugars are starting to get below 100 Will need to cut the glimepiride in half again  Check blood pressure periodically at home, blood pressure range should be 120-130/75-85  Scheduling when blood sugars are more easily controlled

## 2016-12-17 NOTE — Progress Notes (Signed)
Patient ID: Todd Rodriguez, male   DOB: [birth year], 41 y.o.   MRN: [id number]            Reason for Appointment: Consultation for Type 2 Diabetes  Referring physician:   History of Present Illness:          Date of diagnosis of type 2 diabetes mellitus: 10/2013        Background history:   He was diagnosed to have diabetes when he was admitted for pancreatitis Initial A1c was 7.1 and he was not having symptoms Initially treated with metformin alone and subsequently Amaryl was added However his blood sugars have been progressively higher with A1c not being below 7% at any time He had been tried on Jardiance in 2016 but this was stopped because of cost  Recent history:   His A1c has been consistently over 9% in 2017 and most recently 9.4        Non-insulin hypoglycemic drugs the patient is taking are: Amaryl 4 mg on waking up, metformin 1000 mg twice a day  Current management, blood sugar patterns and problems identified:  His blood sugars have been mostly over 200, relatively higher when he comes back from work.  He thinks he is consistent with taking his medications as directed  Although he is trying to eat healthy and avoiding fast food he is at times having high fat meals  Also his drinking at least 2 regular soft drinks and some juice and Gatorade at work.  He does not check his sugars after meals but mostly on waking up in coming back from work, he does work night shifts           Side effects from medications have been: None  Compliance with the medical regimen: Fair Hypoglycemia:   none  Glucose monitoring:  done  times a day         Glucometer: One Touch.      Blood Glucose readings by recall   PREMEAL Breakfast Lunch Dinner Bedtime  Overall   Glucose range: 200   210     Median:        Self-care: The diet that the patient has been following is: tries to limit Fast food .     Meal times are:   Lunch: 9 pm  Dinner: 2 am  Typical meal intake: Breakfast is  sometimes eggs and cheese.  Has sandwiches or baked chicken for meals Dietician visit, most recent: none               Exercise:  none except being active at work  Weight history:  Wt Readings from Last 3 Encounters:  12/17/16 217 lb (98.4 kg)  12/08/16 224 lb (101.6 kg)  07/21/16 216 lb 9.6 oz (98.2 kg)    Glycemic control:   Lab Results  Component Value Date   HGBA1C 9.4 12/08/2016   HGBA1C 12.2 07/21/2016   HGBA1C 9.2 (H) 01/07/2016   Lab Results  Component Value Date   MICROALBUR <0.7 10/09/2015   LDLCALC 50 04/12/2014   CREATININE 1.10 01/07/2016   Lab Results  Component Value Date   MICRALBCREAT 0.7 10/09/2015    No results found for: FRUCTOSAMINE    Allergies as of 12/17/2016   No Known Allergies     Medication List       Accurate as of 12/17/16  3:05 PM. Always use your most recent med list.          amLODipine-benazepril 5-20 MG capsule Commonly  known as:  LOTREL Take 1 capsule by mouth daily.   atorvastatin 20 MG tablet Commonly known as:  LIPITOR TAKE 1 TABLET (20 MG TOTAL) BY MOUTH DAILY.   buPROPion 150 MG 24 hr tablet Commonly known as:  WELLBUTRIN XL Take 1 tablet (150 mg total) by mouth daily.   empagliflozin 10 MG Tabs tablet Commonly known as:  JARDIANCE Take 10 mg by mouth daily.   glimepiride 4 MG tablet Commonly known as:  AMARYL Take 1 tablet (4 mg total) by mouth daily before breakfast. Increase to 6 mg per day after  4 weeks   glucose blood test strip Commonly known as:  ONE TOUCH TEST STRIPS Use as instructed   metFORMIN 500 MG 24 hr tablet Commonly known as:  GLUCOPHAGE-XR TAKE 2 TABLETS (1,000 MG TOTAL) BY MOUTH 2 (TWO) TIMES DAILY.   pantoprazole 40 MG tablet Commonly known as:  PROTONIX Take 1 tablet (40 mg total) by mouth 2 (two) times daily.   pantoprazole 40 MG tablet Commonly known as:  PROTONIX TAKE 1 TABLET BY MOUTH TWICE A DAY   tadalafil 20 MG tablet Commonly known as:  CIALIS Take 1 tablet (20 mg  total) by mouth daily as needed for erectile dysfunction.       Allergies: No Known Allergies  Past Medical History:  Diagnosis Date  . Diabetes mellitus without complication ([place])   . GERD (gastroesophageal reflux disease)   . Hypertension   . Pancreatitis     Past Surgical History:  Procedure Laterality Date  . NECK SURGERY     2 disc  . SPINAL FUSION      Family History  Problem Relation Age of Onset  . Colon cancer Neg Hx   . Pancreatitis Neg Hx   . Pancreatic cancer Neg Hx   . Diabetes Mother   . Hypertension Mother   . Diabetes Father   . Hypertension Father   . Arthritis Maternal Grandmother   . Hypertension Maternal Grandfather   . Diabetes Maternal Grandfather     Social History:  reports that he has been smoking.  He has been smoking about 1.00 pack per day. He has never used smokeless tobacco. He reports that he drinks alcohol. He reports that he does not use drugs.   Review of Systems  Constitutional: Negative for weight loss.  HENT: Negative for headaches.   Eyes: Negative for blurred vision.  Respiratory: Negative for shortness of breath.   Cardiovascular: Negative for chest pain and leg swelling.  Gastrointestinal: Negative for abdominal pain.  Endocrine: Positive for erectile dysfunction. Negative for fatigue and polydipsia.  Genitourinary: Negative for frequency and nocturia.  Musculoskeletal: Negative for joint pain.  Skin: Negative for rash.  Neurological: Positive for numbness. Negative for weakness and tingling.       He has numbness in the 4 days and not the thumb, is being referred by her PCP  Psychiatric/Behavioral: Negative for insomnia.   He has been given bupropion to help him quit smoking  Lipid history: He has been on Lipitor 20 mg, followed by PCP    Lab Results  Component Value Date   CHOL 191 01/07/2016   HDL 40.80 01/07/2016   LDLCALC 50 04/12/2014   LDLDIRECT 115.0 01/07/2016   TRIG 215.0 (H) 01/07/2016   CHOLHDL 5  01/07/2016           Hypertension: On Rx 10 years  BP Readings from Last 3 Encounters:  12/17/16 (!) 146/92  12/08/16 (!) 144/88  07/21/16 (!) 154/80     Most recent eye exam was years ago  Most recent foot exam: 3/18    LABS:  No visits with results within 1 Week(s) from this visit.  Latest known visit with results is:  Office Visit on 12/08/2016  Component Date Value Ref Range Status  . Hemoglobin A1C 12/08/2016 9.4   Final    Physical Examination:  BP (!) 146/92   Pulse 88   Ht 6' (1.829 m)   Wt 217 lb (98.4 kg)   SpO2 97%   BMI 29.43 kg/m   GENERAL:     He is well built and nourished, no significant obesity HEENT:         Eye exam shows normal external appearance. Fundus exam shows no retinopathy.  Oral exam shows normal mucosa .  NECK:   There is no lymphadenopathy Thyroid is not enlarged and no nodules felt.  Carotids are normal to palpation and no bruit heard LUNGS:         Chest is symmetrical. Lungs are clear to auscultation.Marland Kitchen   HEART:         Heart sounds:  S1 and S2 are normal. No murmur or click heard., no S3 or S4.   ABDOMEN:   There is no distention present. Liver and spleen are not palpable. No other mass or tenderness present.   NEUROLOGICAL:   Ankle jerks are 1+ bilaterally.    Diabetic Foot Exam - Simple   Simple Foot Form Diabetic Foot exam was performed with the following findings:  Yes   Visual Inspection No deformities, no ulcerations, no other skin breakdown bilaterally:  Yes Sensation Testing Intact to touch and monofilament testing bilaterally:  Yes Pulse Check Posterior Tibialis and Dorsalis pulse intact bilaterally:  Yes Comments            Vibration sense is Normal in distal first toes. Tinel's sign negative on the right hand  MUSCULOSKELETAL:  There is no swelling or deformity of the peripheral joints.   EXTREMITIES:     There is no edema. No skin lesions present.Marland Kitchen SKIN:       No rash or lesions of concern.          ASSESSMENT:  Diabetes type 2, uncontrolled     He has had a progressive worsening of his diabetes since diagnosis about 2 years ago Currently taking only metformin and Amaryl and this likely indicates decreased insulin production since he is not significantly obese  However he can do better with his diet as far as cutting back on regular soft drinks, juice and other sweetened drinks; he Also can do better with lower fat choices Although he is active at work this is not daily and he can do aerobic exercise on his own on his days off to help with insulin sensitivity He is checking his blood sugars but not clear how often and did not bring his monitor for download today Has had minimal diabetes education  Complications of diabetes:No evidence of neuropathy, needs confirmation of normal retinal exam and follow-up urine microalbumin.  Erectile dysfunction may or may not be related to diabetes  HYPERTENSION: This is long-standing and his blood pressure readings appear to be consistently high  PLAN:     He will need additional medications to help with blood sugar control  Although he needs to reduce his A1c at least 2% he may do well with taking Jardiance along with lifestyle changes Discussed action of SGLT 2 drugs on  lowering glucose by decreasing kidney absorption of glucose, benefits of weight loss and lower blood pressure, possible side effects including candidiasis and dosage regimen   He will start with 10 mg of JARDIANCE and consider increasing to 25 mg on the next visit  Does need baseline renal function to be checked today  He also needs to eliminate regular soft drinks, regular juices and Gatorade and go to sugar free drinks.  Consider consultation with dietitian  He does need to exercise aerobically on the days he is not working  He will need to bring his monitor for download and discussed checking blood sugars either on waking up or 2-3 hours after meals as well as blood  sugar targets  He does need to monitor his blood pressure at home periodically especially with starting Jardiance and also to enable adjustment of his medications  Patient Instructions   Start using diet drinks and as much as possible avoid regular soft drinks and juice  Check blood sugars on waking up  2 or 3 times a week  Also check blood sugars about 2-3 hours after a meal and do this after different meals by rotation  Recommended blood sugar levels on waking up is 90-130 and about 2 hours after meal is 130-160  Please bring your blood sugar monitor to each visit, thank you  Start doing brisk walking for 30 minutes on  days off  JARDIANCE: Take it when you wake up  Continue metformin and glimepiride If the blood sugars are starting to get below 100 Will need to cut the glimepiride in half again  Check blood pressure periodically at home, blood pressure range should be 120-130/75-85  Scheduling when blood sugars are more easily controlled             Consultation note has been sent to the referring physician  [facility] 12/17/2016, 3:05 PM   Note: This office note was prepared with Dragon voice recognition system technology. Any transcriptional errors that result from this process are unintentional.

## 2017-01-19 ENCOUNTER — Ambulatory Visit: Payer: BLUE CROSS/BLUE SHIELD | Admitting: Endocrinology

## 2017-01-27 ENCOUNTER — Emergency Department (HOSPITAL_COMMUNITY)
Admission: EM | Admit: 2017-01-27 | Discharge: 2017-01-28 | Disposition: A | Discharge status: Home / Self Care | Payer: BLUE CROSS/BLUE SHIELD | Attending: Emergency Medicine | Admitting: Emergency Medicine

## 2017-01-27 ENCOUNTER — Other Ambulatory Visit: Payer: Self-pay | Admitting: Internal Medicine

## 2017-01-27 ENCOUNTER — Encounter (HOSPITAL_COMMUNITY): Payer: Self-pay | Admitting: Cardiology

## 2017-01-27 DIAGNOSIS — E119 Type 2 diabetes mellitus without complications: Secondary | ICD-10-CM | POA: Diagnosis not present

## 2017-01-27 DIAGNOSIS — R1013 Epigastric pain: Secondary | ICD-10-CM | POA: Diagnosis present

## 2017-01-27 DIAGNOSIS — K8500 Idiopathic acute pancreatitis without necrosis or infection: Secondary | ICD-10-CM

## 2017-01-27 DIAGNOSIS — F17200 Nicotine dependence, unspecified, uncomplicated: Secondary | ICD-10-CM | POA: Diagnosis not present

## 2017-01-27 DIAGNOSIS — Z7984 Long term (current) use of oral hypoglycemic drugs: Secondary | ICD-10-CM | POA: Diagnosis not present

## 2017-01-27 DIAGNOSIS — I10 Essential (primary) hypertension: Secondary | ICD-10-CM | POA: Diagnosis not present

## 2017-01-27 DIAGNOSIS — Z79899 Other long term (current) drug therapy: Secondary | ICD-10-CM | POA: Insufficient documentation

## 2017-01-27 LAB — CBC
HCT: 47.9 % (ref 39.0–52.0)
HEMOGLOBIN: 16.6 g/dL (ref 13.0–17.0)
MCH: 32.4 pg (ref 26.0–34.0)
MCHC: 34.7 g/dL (ref 30.0–36.0)
MCV: 93.6 fL (ref 78.0–100.0)
PLATELETS: 224 10*3/uL (ref 150–400)
RBC: 5.12 MIL/uL (ref 4.22–5.81)
RDW: 12.7 % (ref 11.5–15.5)
WBC: 11.5 10*3/uL — AB (ref 4.0–10.5)

## 2017-01-27 LAB — URINALYSIS, ROUTINE W REFLEX MICROSCOPIC
BILIRUBIN URINE: NEGATIVE
Bacteria, UA: NONE SEEN
HGB URINE DIPSTICK: NEGATIVE
Ketones, ur: 80 mg/dL — AB
LEUKOCYTES UA: NEGATIVE
NITRITE: NEGATIVE
PROTEIN: NEGATIVE mg/dL
Specific Gravity, Urine: 1.029 (ref 1.005–1.030)
Squamous Epithelial / LPF: NONE SEEN
pH: 5 (ref 5.0–8.0)

## 2017-01-27 LAB — COMPREHENSIVE METABOLIC PANEL
ALK PHOS: 68 U/L (ref 38–126)
ALT: 35 U/L (ref 17–63)
ANION GAP: 13 (ref 5–15)
AST: 27 U/L (ref 15–41)
Albumin: 4.5 g/dL (ref 3.5–5.0)
BILIRUBIN TOTAL: 1.3 mg/dL — AB (ref 0.3–1.2)
BUN: 7 mg/dL (ref 6–20)
CO2: 23 mmol/L (ref 22–32)
CREATININE: 1.05 mg/dL (ref 0.61–1.24)
Calcium: 9.1 mg/dL (ref 8.9–10.3)
Chloride: 96 mmol/L — ABNORMAL LOW (ref 101–111)
Glucose, Bld: 109 mg/dL — ABNORMAL HIGH (ref 65–99)
Potassium: 4.3 mmol/L (ref 3.5–5.1)
Sodium: 132 mmol/L — ABNORMAL LOW (ref 135–145)
TOTAL PROTEIN: 8 g/dL (ref 6.5–8.1)

## 2017-01-27 LAB — LIPASE, BLOOD: Lipase: 56 U/L — ABNORMAL HIGH (ref 11–51)

## 2017-01-27 MED ORDER — SODIUM CHLORIDE 0.9 % IV BOLUS (SEPSIS)
1000.0000 mL | Freq: Once | INTRAVENOUS | Status: AC
  Administered 2017-01-27: 1000 mL via INTRAVENOUS

## 2017-01-27 MED ORDER — HYDROMORPHONE HCL 1 MG/ML IJ SOLN
1.0000 mg | Freq: Once | INTRAMUSCULAR | Status: AC
  Administered 2017-01-27: 1 mg via INTRAVENOUS
  Filled 2017-01-27: qty 1

## 2017-01-27 MED ORDER — PANTOPRAZOLE SODIUM 40 MG IV SOLR
40.0000 mg | Freq: Once | INTRAVENOUS | Status: AC
  Administered 2017-01-27: 40 mg via INTRAVENOUS
  Filled 2017-01-27: qty 40

## 2017-01-27 MED ORDER — OXYCODONE-ACETAMINOPHEN 5-325 MG PO TABS: 1.0000 | ORAL_TABLET | Freq: Four times a day (QID) | ORAL | 0 refills | Status: DC | PRN

## 2017-01-27 MED ORDER — ONDANSETRON HCL 4 MG/2ML IJ SOLN
4.0000 mg | Freq: Once | INTRAMUSCULAR | Status: AC
  Administered 2017-01-27: 4 mg via INTRAVENOUS
  Filled 2017-01-27: qty 2

## 2017-01-27 MED ORDER — ONDANSETRON 4 MG PO TBDP: ORAL_TABLET | ORAL | 0 refills | Status: DC

## 2017-01-27 NOTE — ED Provider Notes (Signed)
[facility] DEPT Provider Note   CSN: [id number] Arrival date & time: 01/27/17  1756 By signing my name below, I, Dyke Brackett, attest that this documentation has been prepared under the direction and in the presence of Milton Ferguson, MD . Electronically Signed: Dyke Brackett, Scribe. 01/27/2017. 9:53 PM.  History   Chief Complaint Chief Complaint  Patient presents with  . Abdominal Pain   HPI Todd Rodriguez is a 41 y.o. male with a history of pancreatitis, GERD, DM and HTN who presents to the Emergency Department complaining of constant, moderate, progressively worsening upper abdominal pain that radiates to his back onset last night. He reports associated nausea, vomiting 4x, and mild diarrhea. Pt states this feels similar to prior pancreatitis. No alleviating or modifying factors noted. He is not currently followed by a GI. Pt denies any hematemesis or any other associated symptoms.  The history is provided by the patient. No language interpreter was used.  Abdominal Pain   This is a new problem. The current episode started yesterday. The problem occurs constantly. The problem has been rapidly worsening. The pain is associated with an unknown factor. The pain is located in the epigastric region. The pain is at a severity of 7/10. Associated symptoms include diarrhea, nausea and vomiting. Pertinent negatives include frequency, hematuria and headaches.    Past Medical History:  Diagnosis Date  . Diabetes mellitus without complication ([place])   . GERD (gastroesophageal reflux disease)   . Hypertension   . Pancreatitis     Patient Active Problem List   Diagnosis Date Noted  . Other pancreatitis 10/28/2015  . Cramps, extremity 06/25/2015  . Diabetes mellitus type 2, uncontrolled, without complications ([place]) 61/44/3154  . Hand cramps 01/25/2015  . Myalgia and myositis 04/21/2014  . Fever, unspecified 03/26/2014  . Hx of acute pancreatitis 01/03/2014  . Tobacco dependence  01/03/2014  . Other and unspecified hyperlipidemia 01/03/2014  . GERD (gastroesophageal reflux disease) 11/22/2013  . Acute pancreatitis 10/25/2013  . Diabetes mellitus type 2, controlled, without complications ([place]) 00/86/7619  . Essential hypertension, benign 10/25/2013  . Leukocytosis, unspecified 10/25/2013    Past Surgical History:  Procedure Laterality Date  . NECK SURGERY     2 disc  . SPINAL FUSION       Home Medications    Prior to Admission medications   Medication Sig Start Date End Date Taking? Authorizing Provider  amLODipine-benazepril (LOTREL) 5-20 MG capsule Take 1 capsule by mouth daily. 07/21/16   Burnis Medin, MD  atorvastatin (LIPITOR) 20 MG tablet TAKE 1 TABLET (20 MG TOTAL) BY MOUTH DAILY. 07/21/16   Burnis Medin, MD  buPROPion (WELLBUTRIN XL) 150 MG 24 hr tablet Take 1 tablet (150 mg total) by mouth daily. 12/08/16   Burnis Medin, MD  empagliflozin (JARDIANCE) 10 MG TABS tablet Take 10 mg by mouth daily. 12/17/16   Elayne Snare, MD  glimepiride (AMARYL) 4 MG tablet Take 1 tablet (4 mg total) by mouth daily before breakfast. Increase to 6 mg per day after  4 weeks 12/08/16   Burnis Medin, MD  glucose blood (ONE TOUCH TEST STRIPS) test strip Use as instructed 06/25/15   Burnis Medin, MD  metFORMIN (GLUCOPHAGE-XR) 500 MG 24 hr tablet TAKE 2 TABLETS (1,000 MG TOTAL) BY MOUTH 2 (TWO) TIMES DAILY. 07/21/16   Burnis Medin, MD  pantoprazole (PROTONIX) 40 MG tablet Take 1 tablet (40 mg total) by mouth 2 (two) times daily. Patient taking differently: Take 40 mg by mouth  daily.  06/25/15   Burnis Medin, MD  pantoprazole (PROTONIX) 40 MG tablet TAKE 1 TABLET BY MOUTH TWICE A DAY Patient not taking: Reported on 12/17/2016 06/10/16   Burnis Medin, MD  tadalafil (CIALIS) 20 MG tablet Take 1 tablet (20 mg total) by mouth daily as needed for erectile dysfunction. 06/25/15   Burnis Medin, MD   Family History Family History  Problem Relation Age of Onset  . Diabetes  Mother   . Hypertension Mother   . Diabetes Father   . Hypertension Father   . Arthritis Maternal Grandmother   . Hypertension Maternal Grandfather   . Diabetes Maternal Grandfather   . Colon cancer Neg Hx   . Pancreatitis Neg Hx   . Pancreatic cancer Neg Hx     Social History Social History  Substance Use Topics  . Smoking status: Current Every Day Smoker    Packs/day: 1.00  . Smokeless tobacco: Never Used  . Alcohol use Yes     Comment: every weekend    Allergies   Patient has no known allergies.  Review of Systems Review of Systems  Constitutional: Negative for appetite change and fatigue.  HENT: Negative for congestion, ear discharge and sinus pressure.   Eyes: Negative for discharge.  Respiratory: Negative for cough.   Cardiovascular: Negative for chest pain.  Gastrointestinal: Positive for abdominal pain, diarrhea, nausea and vomiting.  Genitourinary: Negative for frequency and hematuria.  Musculoskeletal: Negative for back pain.  Skin: Negative for rash.  Neurological: Negative for seizures and headaches.  Psychiatric/Behavioral: Negative for hallucinations.   Physical Exam Updated Vital Signs BP (!) 166/98 (BP Location: Right Arm)   Pulse 87   Temp 98.4 F (36.9 C) (Oral)   Resp 16   Ht 6' 2"  (1.88 m)   Wt 215 lb (97.5 kg)   SpO2 100%   BMI 27.60 kg/m   Physical Exam  Constitutional: He is oriented to person, place, and time. He appears well-developed.  HENT:  Head: Normocephalic.  Eyes: Conjunctivae and EOM are normal. No scleral icterus.  Neck: Neck supple. No thyromegaly present.  Cardiovascular: Normal rate and regular rhythm.  Exam reveals no gallop and no friction rub.   No murmur heard. Pulmonary/Chest: No stridor. He has no wheezes. He has no rales. He exhibits no tenderness.  Abdominal: He exhibits no distension. There is tenderness (moderate epigastric tenderness). There is no rebound.  Musculoskeletal: Normal range of motion. He  exhibits no edema.  Lymphadenopathy:    He has no cervical adenopathy.  Neurological: He is oriented to person, place, and time. He exhibits normal muscle tone. Coordination normal.  Skin: No rash noted. No erythema.  Psychiatric: He has a normal mood and affect. His behavior is normal.   ED Treatments / Results  DIAGNOSTIC STUDIES:  Oxygen Saturation is 100% on RA, normal by my interpretation.    COORDINATION OF CARE:  9:51 PM Discussed treatment plan with pt at bedside and pt agreed to plan.   Labs (all labs ordered are listed, but only abnormal results are displayed) Labs Reviewed  LIPASE, BLOOD - Abnormal; Notable for the following:       Result Value   Lipase 56 (*)    All other components within normal limits  COMPREHENSIVE METABOLIC PANEL - Abnormal; Notable for the following:    Sodium 132 (*)    Chloride 96 (*)    Glucose, Bld 109 (*)    Total Bilirubin 1.3 (*)    All  other components within normal limits  CBC - Abnormal; Notable for the following:    WBC 11.5 (*)    All other components within normal limits  URINALYSIS, ROUTINE W REFLEX MICROSCOPIC    EKG  EKG Interpretation None       Radiology No results found.  Procedures Procedures (including critical care time)  Medications Ordered in ED Medications - No data to display   Initial Impression / Assessment and Plan / ED Course  I have reviewed the triage vital signs and the nursing notes.  Pertinent labs & imaging results that were available during my care of the patient were reviewed by me and considered in my medical decision making (see chart for details).     Pancreatitis.  tx with liquids,  Pain meds, zofran and follow up   Final Clinical Impressions(s) / ED Diagnoses   Final diagnoses:  None    New Prescriptions New Prescriptions   No medications on file   The chart was scribed for me under my direct supervision.  I personally performed the history, physical, and medical  decision making and all procedures in the evaluation of this patient.Milton Ferguson, MD 01/29/17 [phone]

## 2017-01-27 NOTE — ED Triage Notes (Signed)
Abdominal pain and vomiting since yesterday.

## 2017-01-27 NOTE — Discharge Instructions (Signed)
Take liquids only for the next 2 or 3 days. Follow-up with your doctor next week. Return sooner if problems

## 2017-01-27 NOTE — Telephone Encounter (Signed)
Right  He should not be on lisinopril because he should be on lotrel which inculdes an ace inhibitor .   Please dc this med.

## 2017-01-27 NOTE — Telephone Encounter (Signed)
Pharmacy is requesting a refill. Saw in your last note that patient is no longer taking this medication. Just confirming or would you like for me to call the pt to confirm?

## 2017-01-28 NOTE — ED Notes (Signed)
Pt ambulatory to waiting room. Pt verbalized understanding of discharge instructions.   

## 2017-01-29 ENCOUNTER — Ambulatory Visit: Payer: BLUE CROSS/BLUE SHIELD | Admitting: Endocrinology

## 2017-02-02 ENCOUNTER — Other Ambulatory Visit: Payer: Self-pay | Admitting: Internal Medicine

## 2017-02-15 ENCOUNTER — Telehealth: Payer: Self-pay | Admitting: Endocrinology

## 2017-02-15 NOTE — Telephone Encounter (Signed)
Schedule patient for follow-up at the earliest appointment

## 2017-02-15 NOTE — Telephone Encounter (Signed)
Patient no showed today's appt. Please advise on how to follow up. °A. No follow up necessary. °B. Follow up urgent. Contact patient immediately. °C. Follow up necessary. Contact patient and schedule visit in ___ days. °D. Follow up advised. Contact patient and schedule visit in ____weeks. ° °

## 2017-02-16 ENCOUNTER — Encounter: Payer: Self-pay | Admitting: Internal Medicine

## 2017-02-16 ENCOUNTER — Ambulatory Visit (INDEPENDENT_AMBULATORY_CARE_PROVIDER_SITE_OTHER): Payer: BLUE CROSS/BLUE SHIELD | Admitting: Internal Medicine

## 2017-02-16 VITALS — BP 150/90 | HR 97 | Temp 98.3°F | Ht 74.0 in | Wt 214.2 lb

## 2017-02-16 DIAGNOSIS — R202 Paresthesia of skin: Secondary | ICD-10-CM

## 2017-02-16 DIAGNOSIS — R200 Anesthesia of skin: Secondary | ICD-10-CM | POA: Diagnosis not present

## 2017-02-16 DIAGNOSIS — E1165 Type 2 diabetes mellitus with hyperglycemia: Secondary | ICD-10-CM | POA: Diagnosis not present

## 2017-02-16 DIAGNOSIS — F17200 Nicotine dependence, unspecified, uncomplicated: Secondary | ICD-10-CM | POA: Diagnosis not present

## 2017-02-16 DIAGNOSIS — I10 Essential (primary) hypertension: Secondary | ICD-10-CM

## 2017-02-16 DIAGNOSIS — Z79899 Other long term (current) drug therapy: Secondary | ICD-10-CM | POA: Diagnosis not present

## 2017-02-16 DIAGNOSIS — N529 Male erectile dysfunction, unspecified: Secondary | ICD-10-CM

## 2017-02-16 DIAGNOSIS — IMO0001 Reserved for inherently not codable concepts without codable children: Secondary | ICD-10-CM

## 2017-02-16 MED ORDER — AMLODIPINE BESY-BENAZEPRIL HCL 10-40 MG PO CAPS: 1.0000 | ORAL_CAPSULE | Freq: Every day | ORAL | 5 refills | Status: DC

## 2017-02-16 MED ORDER — TADALAFIL 20 MG PO TABS: 20.0000 mg | ORAL_TABLET | Freq: Every day | ORAL | 3 refills | Status: DC | PRN

## 2017-02-16 NOTE — Progress Notes (Signed)
Chief Complaint  Patient presents with  . Follow-up    HPI: Todd Rodriguez 41 y.o. come in for Chronic disease management  [facility] ed  Last month for pancreatitis .  Ant to have fu with dr Dwyane Dee  No etoh   Check at home bp up some  Concern about ed ? Se med  On Wellbutrin  Down to 1/2ppd  Eye exam ok som iop? Yearly check advised  r hand arm pain and numbness  Problematic   To see neuro  Not until June for ncs?  ROS: See pertinent positives and negatives per HPI. No cp sob other  weaknkess  Dm back on jardiance  Past Medical History:  Diagnosis Date  . Diabetes mellitus without complication ([place])   . GERD (gastroesophageal reflux disease)   . Hypertension   . Pancreatitis     Family History  Problem Relation Age of Onset  . Diabetes Mother   . Hypertension Mother   . Diabetes Father   . Hypertension Father   . Arthritis Maternal Grandmother   . Hypertension Maternal Grandfather   . Diabetes Maternal Grandfather   . Colon cancer Neg Hx   . Pancreatitis Neg Hx   . Pancreatic cancer Neg Hx     Social History   Social History  . Marital status: Married    Spouse name: N/A  . Number of children: 1  . Years of education: N/A   Occupational History  .  [employer] History Main Topics  . Smoking status: Current Every Day Smoker    Packs/day: 1.00  . Smokeless tobacco: Never Used  . Alcohol use Yes     Comment: every weekend  . Drug use: No  . Sexual activity: Yes    Partners: Female     Comment: Patient's Wife   Other Topics Concern  . 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 atorvastatin (LIPITOR)  20 MG tablet TAKE 1 TABLET (20 MG TOTAL) BY MOUTH DAILY. 90 tablet 0  . buPROPion (WELLBUTRIN XL) 150 MG 24 hr tablet Take 1 tablet (150 mg total) by mouth daily. 60 tablet 3  . empagliflozin (JARDIANCE) 10 MG TABS tablet Take 10 mg by mouth daily. 30 tablet 1  . glimepiride (AMARYL) 4 MG tablet Take 1 tablet (4 mg total) by mouth daily before breakfast. Increase to 6 mg per day after  4 weeks (Patient taking differently: Take 4 mg by mouth daily before breakfast. ) 45 tablet 3  . glucose blood (ONE TOUCH TEST STRIPS) test strip Use as instructed 100 each 11  . metFORMIN (GLUCOPHAGE-XR) 500 MG 24 hr tablet TAKE 2 TABLETS (1,000 MG TOTAL) BY MOUTH 2 (TWO) TIMES DAILY. 360 tablet 0  . ondansetron (ZOFRAN ODT) 4 MG disintegrating tablet 62m ODT q4 hours prn nausea/vomit 12 tablet 0  . oxyCODONE-acetaminophen (PERCOCET/ROXICET) 5-325 MG tablet Take 1 tablet by mouth every 6 (six) hours as needed. 20 tablet 0  .  pantoprazole (PROTONIX) 40 MG tablet Take 1 tablet (40 mg total) by mouth 2 (two) times daily. (Patient taking differently: Take 40 mg by mouth daily. ) 180 tablet 1  . amLODipine-benazepril (LOTREL) 5-20 MG capsule TAKE 1 CAPSULE BY MOUTH DAILY. 30 capsule 4  . tadalafil (CIALIS) 20 MG tablet Take 1 tablet (20 mg total) by mouth daily as needed for erectile dysfunction. 10 tablet 3   No facility-administered medications prior to visit.      EXAM:  BP (!) 150/90 (BP Location: Left Arm)   Pulse 97   Temp 98.3 F (36.8 C) (Oral)   Ht 6' 2"  (1.88 m)   Wt 214 lb 3.2 oz (97.2 kg)   BMI 27.50 kg/m   Body mass index is 27.5 kg/m. Repeat bp 148/80 GENERAL: vitals reviewed and listed above, alert, oriented, appears well hydrated and in no acute distress HEENT: atraumatic, conjunctiva  clear, no obvious abnormalities on inspection of external nose and ears   NECK: no obvious masses on inspection palpation   CV: HRRR, no clubbing cyanosis or  peripheral edema nl cap refill  MS: moves all  extremities without noticeable focal  abnormality PSYCH: pleasant and cooperative, no obvious depression or anxiety Lab Results  Component Value Date   WBC 11.5 (H) 01/27/2017   HGB 16.6 01/27/2017   HCT 47.9 01/27/2017   PLT 224 01/27/2017   GLUCOSE 109 (H) 01/27/2017   CHOL 191 01/07/2016   TRIG 215.0 (H) 01/07/2016   HDL 40.80 01/07/2016   LDLDIRECT 115.0 01/07/2016   LDLCALC 50 04/12/2014   ALT 35 01/27/2017   AST 27 01/27/2017   NA 132 (L) 01/27/2017   K 4.3 01/27/2017   CL 96 (L) 01/27/2017   CREATININE 1.05 01/27/2017   BUN 7 01/27/2017   CO2 23 01/27/2017   TSH 1.39 01/07/2016   PSA 0.76 01/07/2016   HGBA1C 9.4 12/08/2016   MICROALBUR 1.5 12/17/2016   BP Readings from Last 3 Encounters:  02/16/17 (!) 150/90  01/27/17 (!) 158/98  12/17/16 (!) 146/92    ASSESSMENT AND PLAN:  Discussed the following assessment and plan:  Essential hypertension, benign - sub optimal control  intesnify rx  Medication management  Tobacco dependence - continue cessation efforts  can effect ed   Uncontrolled type 2 diabetes mellitus without complication, without long-term current use of insulin ([place]) - fu dr Dwyane Dee  Numbness and tingling in right hand - problematic under eval  Erectile dysfunction, unspecified erectile dysfunction type  -Patient advised to return or notify health care team  if  new concerns arise.  Patient Instructions  Stopping tobacco  Is best for your   Artery health  And ed.   Can try doubling up on the lotrel   5 20       Then can   Try 10 /40 to be sent in .  I don think this med is causing ed  Sx .  Bp control important. Can check readings at home.  See if you can move up your  Arm numb neuro  evaluation.  Will refill the cilias.    At this time.   ROV in 3 months       Standley Brooking. Panosh M.D.

## 2017-02-16 NOTE — Patient Instructions (Addendum)
Stopping tobacco  Is best for your   Artery health  And ed.   Can try doubling up on the lotrel   5 20       Then can   Try 10 /40 to be sent in .  I don think this med is causing ed  Sx .  Bp control important. Can check readings at home.  See if you can move up your  Arm numb neuro  evaluation.  Will refill the cilias.    At this time.   ROV in 3 months

## 2017-02-21 ENCOUNTER — Other Ambulatory Visit: Payer: Self-pay | Admitting: Endocrinology

## 2017-03-08 NOTE — Progress Notes (Signed)
Chief Complaint  Patient presents with  . Stress  . Insomnia    HPI: Todd Rodriguez 41 y.o. come in for  Problems  realetd to death of fathert  He has been feeling like he has stress anxiety and insomnia since his father's death and funeral. 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 Funeral on May 25 . 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 He has lots of stress is previous sleep schedule was 8 AM to 2 PM and now is trying to go to bed at 10:00 but can fall asleep until 1 AM and has to get up at 6. He also has palpitations from stress He did miss his diabetes appointment because of his father's death and funeral. Denies chest pain shortness of breath otherwise no alcohol. ROS: See pertinent positives and negatives per HPI. No etoh NS dr Dwyane Dee _0 Past Medical History:  Diagnosis Date  . Diabetes mellitus without complication ([place])   . GERD (gastroesophageal reflux disease)   . Hypertension   . Pancreatitis     Family History  Problem Relation Age of Onset  . Diabetes Mother   . Hypertension Mother   . Diabetes Father   . Hypertension Father   . Arthritis Maternal Grandmother   . Hypertension Maternal Grandfather   . Diabetes Maternal Grandfather   . Colon cancer Neg Hx   . Pancreatitis Neg Hx   . Pancreatic cancer Neg Hx     Social History   Social History  . Marital status: Married    Spouse name: N/A  . Number of children: 1  . Years of education: N/A   Occupational History  .  [employer] History Main Topics  . Smoking status: Current Every Day Smoker    Packs/day: 1.00  . Smokeless tobacco: Never Used  . Alcohol use Yes     Comment: every weekend  . Drug use: No  . Sexual activity: Yes    Partners: Female     Comment:  Patient's Wife   Other Topics Concern  . 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  GENERAL: vitals reviewed and listed above, alert, oriented, appears well hydrated and in no acute distress appropriate for situation  PSYCH:cooperative,  Nl thought and attention  Lab Results  Component Value Date   WBC 11.5 (H) 01/27/2017   HGB 16.6 01/27/2017   HCT 47.9 01/27/2017   PLT 224 01/27/2017   GLUCOSE  109 (H) 01/27/2017   CHOL 191 01/07/2016   TRIG 215.0 (H) 01/07/2016   HDL 40.80 01/07/2016   LDLDIRECT 115.0 01/07/2016   LDLCALC 50 04/12/2014   ALT 35 01/27/2017   AST 27 01/27/2017   NA 132 (L) 01/27/2017   K 4.3 01/27/2017   CL 96 (L) 01/27/2017   CREATININE 1.05 01/27/2017   BUN 7 01/27/2017   CO2 23 01/27/2017   TSH 1.39 01/07/2016   PSA 0.76 01/07/2016   HGBA1C 9.4 12/08/2016   MICROALBUR 1.5 12/17/2016   BP Readings from Last 3 Encounters:  03/09/17 130/80  02/16/17 (!) 150/90  01/27/17 (!) 158/98   Wt Readings from Last 3 Encounters:  03/09/17 211 lb 14.4 oz (96.1 kg)  02/16/17 214 lb 3.2 oz (97.2 kg)  01/27/17 215 lb (97.5 kg)    ASSESSMENT AND PLAN:  Discussed the following assessment and plan:  Situational insomnia  Death of parent  Uncontrolled type 2 diabetes mellitus without complication, without long-term current use of insulin ([facility])  Medication management  Situational anxiety - as above  Stress I believe that his insomnia is related both to having  to do a rapid shift change from nights to days take a new business over and sudden death of his father recently. Discussed self-care make a follow-up appointment with Dr. Dwyane Dee even though he is busy and he agrees At this point we'll try evening of Remeron which may not reset his clock but may help with anxiety and sleep if that isn't helpful we did discuss Ambien short-term intermittent risk-benefit. We'll have him contact us in 1-2 weeks about how things are going to try Expectant management with  Work of grief and bereavement of sudden death.in father who was "best friend" -Patient advised to return or notify health care team  if  new concerns arise. Total visit 22mns > 50% spent counseling and coordinating care as indicated in above note and in instructions to patient .    Patient Instructions  Try new medication at night it is not a sleeping pill but used for sleep  Has been used for   Anxiety in past. At higher doses.    Contact me in 1-2 weeks and see how this is doing    Because you are having to do a shift phasing her sleep sometimes I will use Ambien for 2-3 days in arrival but that medication as a sleep aid can be dependent producing previously so I do not recommend long-term. Get another appointment with Dr. KDwyane Deeas we discussed and I'll send a message. Be gentle with yourself.let [place]now how we can help.     Stress and Stress Management Stress is a normal reaction to life events. It is what you feel when life demands more than you are used to or more than you can handle. Some stress can be useful. For example, the stress reaction can help you catch the last bus of the day, study for a test, or meet a deadline at work. But stress that occurs too often or for too long can cause problems. It can affect your emotional health and interfere with relationships and normal daily activities. Too much stress can weaken your immune system and increase your risk for physical illness. If you  already have a medical problem, stress can make it worse. What are the causes? All sorts of life events may cause stress. An event that causes stress for one person may not be stressful for another person. Major life  events commonly cause stress. These may be positive or negative. Examples include losing your job, moving into a new home, getting married, having a baby, or losing a loved one. Less obvious life events may also cause stress, especially if they occur day after day or in combination. Examples include working long hours, driving in traffic, caring for children, being in debt, or being in a difficult relationship. What are the signs or symptoms? Stress may cause emotional symptoms including, the following:  Anxiety. This is feeling worried, afraid, on edge, overwhelmed, or out of control.  Anger. This is feeling irritated or impatient.  Depression. This is feeling sad, down, helpless, or guilty.  Difficulty focusing, remembering, or making decisions.  Stress may cause physical symptoms, including the following:  Aches and pains. These may affect your head, neck, back, stomach, or other areas of your body.  Tight muscles or clenched jaw.  Low energy or trouble sleeping.  Stress may cause unhealthy behaviors, including the following:  Eating to feel better (overeating) or skipping meals.  Sleeping too little, too much, or both.  Working too much or putting off tasks (procrastination).  Smoking, drinking alcohol, or using drugs to feel better.  How is this diagnosed? Stress is diagnosed through an assessment by your health care provider. Your health care provider will ask questions about your symptoms and any stressful life events.Your health care provider will also ask about your medical history and may order blood tests or other tests. Certain medical conditions and medicine can cause physical symptoms similar to stress. Mental illness can cause emotional symptoms and  unhealthy behaviors similar to stress. Your health care provider may refer you to a mental health professional for further evaluation. How is this treated? Stress management is the recommended treatment for stress.The goals of stress management are reducing stressful life events and coping with stress in healthy ways. Techniques for reducing stressful life events include the following:  Stress identification. Self-monitor for stress and identify what causes stress for you. These skills may help you to avoid some stressful events.  Time management. Set your priorities, keep a calendar of events, and learn to say "no." These tools can help you avoid making too many commitments.  Techniques for coping with stress include the following:  Rethinking the problem. Try to think realistically about stressful events rather than ignoring them or overreacting. Try to find the positives in a stressful situation rather than focusing on the negatives.  Exercise. Physical exercise can release both physical and emotional tension. The key is to find a form of exercise you enjoy and do it regularly.  Relaxation techniques. These relax the body and mind. Examples include yoga, meditation, tai chi, biofeedback, deep breathing, progressive muscle relaxation, listening to music, being out in nature, journaling, and other hobbies. Again, the key is to find one or more that you enjoy and can do regularly.  Healthy lifestyle. Eat a balanced diet, get plenty of sleep, and do not smoke. Avoid using alcohol or drugs to relax.  Strong support network. Spend time with family, friends, or other people you enjoy being around.Express your feelings and talk things over with someone you trust.  Counseling or talktherapy with a mental health professional may be helpful if you are having difficulty managing stress on your own. Medicine is typically not recommended for the treatment of stress.Talk to your health care provider if  you think you need medicine for symptoms of stress. Follow these instructions at home:  Keep all follow-up  visits as directed by your health care provider.  Take all medicines as directed by your health care provider. Contact a health care provider if:  Your symptoms get worse or you start having new symptoms.  You feel overwhelmed by your problems and can no longer manage them on your own. Get help right away if:  You feel like hurting yourself or someone else. This information is not intended to replace advice given to you by your health care provider. Make sure you discuss any questions you have with your health care provider. Document Released: 03/17/2001 Document Revised: 02/27/2016 Document Reviewed: 05/16/2013 Elsevier Interactive Patient Education  2017 [employer].   Insomnia Insomnia is a sleep disorder that makes it difficult to fall asleep or to stay asleep. Insomnia can cause tiredness (fatigue), low energy, difficulty concentrating, mood swings, and poor performance at work or school. There are three different ways to classify insomnia:  Difficulty falling asleep.  Difficulty staying asleep.  Waking up too early in the morning.  Any type of insomnia can be long-term (chronic) or short-term (acute). Both are common. Short-term insomnia usually lasts for three months or less. Chronic insomnia occurs at least three times a week for longer than three months. What are the causes? Insomnia may be caused by another condition, situation, or substance, such as:  Anxiety.  Certain medicines.  Gastroesophageal reflux disease (GERD) or other gastrointestinal conditions.  Asthma or other breathing conditions.  Restless legs syndrome, sleep apnea, or other sleep disorders.  Chronic pain.  Menopause. This may include hot flashes.  Stroke.  Abuse of alcohol, tobacco, or illegal drugs.  Depression.  Caffeine.  Neurological disorders, such as Alzheimer  disease.  An overactive thyroid (hyperthyroidism).  The cause of insomnia may not be known. What increases the risk? Risk factors for insomnia include:  Gender. Women are more commonly affected than men.  Age. Insomnia is more common as you get older.  Stress. This may involve your professional or personal life.  Income. Insomnia is more common in people with lower income.  Lack of exercise.  Irregular work schedule or night shifts.  Traveling between different time zones.  What are the signs or symptoms? If you have insomnia, trouble falling asleep or trouble staying asleep is the main symptom. This may lead to other symptoms, such as:  Feeling fatigued.  Feeling nervous about going to sleep.  Not feeling rested in the morning.  Having trouble concentrating.  Feeling irritable, anxious, or depressed.  How is this treated? Treatment for insomnia depends on the cause. If your insomnia is caused by an underlying condition, treatment will focus on addressing the condition. Treatment may also include:  Medicines to help you sleep.  Counseling or therapy.  Lifestyle adjustments.  Follow these instructions at home:  Take medicines only as directed by your health care provider.  Keep regular sleeping and waking hours. Avoid naps.  Keep a sleep diary to help you and your health care provider figure out what could be causing your insomnia. Include: ? When you sleep. ? When you wake up during the night. ? How well you sleep. ? How rested you feel the next day. ? Any side effects of medicines you are taking. ? What you eat and drink.  Make your bedroom a comfortable place where it is easy to fall asleep: ? Put up shades or special blackout curtains to block light from outside. ? Use a white noise machine to block noise. ? Keep the temperature cool.  Exercise regularly as directed by your health care provider. Avoid exercising right before bedtime.  Use relaxation  techniques to manage stress. Ask your health care provider to suggest some techniques that may work well for you. These may include: ? Breathing exercises. ? Routines to release muscle tension. ? Visualizing peaceful scenes.  Cut back on alcohol, caffeinated beverages, and cigarettes, especially close to bedtime. These can disrupt your sleep.  Do not overeat or eat spicy foods right before bedtime. This can lead to digestive discomfort that can make it hard for you to sleep.  Limit screen use before bedtime. This includes: ? Watching TV. ? Using your smartphone, tablet, and computer.  Stick to a routine. This can help you fall asleep faster. Try to do a quiet activity, brush your teeth, and go to bed at the same time each night.  Get out of bed if you are still awake after 15 minutes of trying to sleep. Keep the lights down, but try reading or doing a quiet activity. When you feel sleepy, go back to bed.  Make sure that you drive carefully. Avoid driving if you feel very sleepy.  Keep all follow-up appointments as directed by your health care provider. This is important. Contact a health care provider if:  You are tired throughout the day or have trouble in your daily routine due to sleepiness.  You continue to have sleep problems or your sleep problems get worse. Get help right away if:  You have serious thoughts about hurting yourself or someone else. This information is not intended to replace advice given to you by your health care provider. Make sure you discuss any questions you have with your health care provider. Document Released: 09/18/2000 Document Revised: 02/21/2016 Document Reviewed: 06/22/2014 Elsevier Interactive Patient Education  2018 [place]. Panosh M.D.

## 2017-03-09 ENCOUNTER — Ambulatory Visit (INDEPENDENT_AMBULATORY_CARE_PROVIDER_SITE_OTHER): Payer: BLUE CROSS/BLUE SHIELD | Admitting: Internal Medicine

## 2017-03-09 ENCOUNTER — Encounter: Payer: Self-pay | Admitting: Internal Medicine

## 2017-03-09 VITALS — BP 130/80 | HR 91 | Temp 98.4°F | Ht 74.0 in | Wt 211.9 lb

## 2017-03-09 DIAGNOSIS — F439 Reaction to severe stress, unspecified: Secondary | ICD-10-CM | POA: Diagnosis not present

## 2017-03-09 DIAGNOSIS — F418 Other specified anxiety disorders: Secondary | ICD-10-CM | POA: Diagnosis not present

## 2017-03-09 DIAGNOSIS — Z79899 Other long term (current) drug therapy: Secondary | ICD-10-CM

## 2017-03-09 DIAGNOSIS — F5109 Other insomnia not due to a substance or known physiological condition: Secondary | ICD-10-CM | POA: Diagnosis not present

## 2017-03-09 DIAGNOSIS — E1165 Type 2 diabetes mellitus with hyperglycemia: Secondary | ICD-10-CM | POA: Diagnosis not present

## 2017-03-09 DIAGNOSIS — Z634 Disappearance and death of family member: Secondary | ICD-10-CM | POA: Diagnosis not present

## 2017-03-09 DIAGNOSIS — IMO0001 Reserved for inherently not codable concepts without codable children: Secondary | ICD-10-CM

## 2017-03-09 MED ORDER — MIRTAZAPINE 15 MG PO TABS: 15.0000 mg | ORAL_TABLET | Freq: Every day | ORAL | 1 refills | Status: DC

## 2017-03-09 NOTE — Patient Instructions (Addendum)
Try new medication at night it is not a sleeping pill but used for sleep  Has been used for   Anxiety in past. At higher doses.    Contact me in 1-2 weeks and see how this is doing    Because you are having to do a shift phasing her sleep sometimes I will use Ambien for 2-3 days in arrival but that medication as a sleep aid can be dependent producing previously so I do not recommend long-term. Get another appointment with Dr. Dwyane Dee as we discussed and I'll send a message. Be gentle with yourself.let us know how we can help.     Stress and Stress Management Stress is a normal reaction to life events. It is what you feel when life demands more than you are used to or more than you can handle. Some stress can be useful. For example, the stress reaction can help you catch the last bus of the day, study for a test, or meet a deadline at work. But stress that occurs too often or for too long can cause problems. It can affect your emotional health and interfere with relationships and normal daily activities. Too much stress can weaken your immune system and increase your risk for physical illness. If you already have a medical problem, stress can make it worse. What are the causes? All sorts of life events may cause stress. An event that causes stress for one person may not be stressful for another person. Major life events commonly cause stress. These may be positive or negative. Examples include losing your job, moving into a new home, getting married, having a baby, or losing a loved one. Less obvious life events may also cause stress, especially if they occur day after day or in combination. Examples include working long hours, driving in traffic, caring for children, being in debt, or being in a difficult relationship. What are the signs or symptoms? Stress may cause emotional symptoms including, the following:  Anxiety. This is feeling worried, afraid, on edge, overwhelmed, or out of  control.  Anger. This is feeling irritated or impatient.  Depression. This is feeling sad, down, helpless, or guilty.  Difficulty focusing, remembering, or making decisions.  Stress may cause physical symptoms, including the following:  Aches and pains. These may affect your head, neck, back, stomach, or other areas of your body.  Tight muscles or clenched jaw.  Low energy or trouble sleeping.  Stress may cause unhealthy behaviors, including the following:  Eating to feel better (overeating) or skipping meals.  Sleeping too little, too much, or both.  Working too much or putting off tasks (procrastination).  Smoking, drinking alcohol, or using drugs to feel better.  How is this diagnosed? Stress is diagnosed through an assessment by your health care provider. Your health care provider will ask questions about your symptoms and any stressful life events.Your health care provider will also ask about your medical history and may order blood tests or other tests. Certain medical conditions and medicine can cause physical symptoms similar to stress. Mental illness can cause emotional symptoms and unhealthy behaviors similar to stress. Your health care provider may refer you to a mental health professional for further evaluation. How is this treated? Stress management is the recommended treatment for stress.The goals of stress management are reducing stressful life events and coping with stress in healthy ways. Techniques for reducing stressful life events include the following:  Stress identification. Self-monitor for stress and identify what causes stress for  you. These skills may help you to avoid some stressful events.  Time management. Set your priorities, keep a calendar of events, and learn to say "no." These tools can help you avoid making too many commitments.  Techniques for coping with stress include the following:  Rethinking the problem. Try to think realistically about  stressful events rather than ignoring them or overreacting. Try to find the positives in a stressful situation rather than focusing on the negatives.  Exercise. Physical exercise can release both physical and emotional tension. The key is to find a form of exercise you enjoy and do it regularly.  Relaxation techniques. These relax the body and mind. Examples include yoga, meditation, tai chi, biofeedback, deep breathing, progressive muscle relaxation, listening to music, being out in nature, journaling, and other hobbies. Again, the key is to find one or more that you enjoy and can do regularly.  Healthy lifestyle. Eat a balanced diet, get plenty of sleep, and do not smoke. Avoid using alcohol or drugs to relax.  Strong support network. Spend time with family, friends, or other people you enjoy being around.Express your feelings and talk things over with someone you trust.  Counseling or talktherapy with a mental health professional may be helpful if you are having difficulty managing stress on your own. Medicine is typically not recommended for the treatment of stress.Talk to your health care provider if you think you need medicine for symptoms of stress. Follow these instructions at home:  Keep all follow-up visits as directed by your health care provider.  Take all medicines as directed by your health care provider. Contact a health care provider if:  Your symptoms get worse or you start having new symptoms.  You feel overwhelmed by your problems and can no longer manage them on your own. Get help right away if:  You feel like hurting yourself or someone else. This information is not intended to replace advice given to you by your health care provider. Make sure you discuss any questions you have with your health care provider. Document Released: 03/17/2001 Document Revised: 02/27/2016 Document Reviewed: 05/16/2013 Elsevier Interactive Patient Education  2017 [place].   Insomnia Insomnia is a sleep disorder that makes it difficult to fall asleep or to stay asleep. Insomnia can cause tiredness (fatigue), low energy, difficulty concentrating, mood swings, and poor performance at work or school. There are three different ways to classify insomnia:  Difficulty falling asleep.  Difficulty staying asleep.  Waking up too early in the morning.  Any type of insomnia can be long-term (chronic) or short-term (acute). Both are common. Short-term insomnia usually lasts for three months or less. Chronic insomnia occurs at least three times a week for longer than three months. What are the causes? Insomnia may be caused by another condition, situation, or substance, such as:  Anxiety.  Certain medicines.  Gastroesophageal reflux disease (GERD) or other gastrointestinal conditions.  Asthma or other breathing conditions.  Restless legs syndrome, sleep apnea, or other sleep disorders.  Chronic pain.  Menopause. This may include hot flashes.  Stroke.  Abuse of alcohol, tobacco, or illegal drugs.  Depression.  Caffeine.  Neurological disorders, such as Alzheimer disease.  An overactive thyroid (hyperthyroidism).  The cause of insomnia may not be known. What increases the risk? Risk factors for insomnia include:  Gender. Women are more commonly affected than men.  Age. Insomnia is more common as you get older.  Stress. This may involve your professional or personal life.  Income.  Insomnia is more common in people with lower income.  Lack of exercise.  Irregular work schedule or night shifts.  Traveling between different time zones.  What are the signs or symptoms? If you have insomnia, trouble falling asleep or trouble staying asleep is the main symptom. This may lead to other symptoms, such as:  Feeling fatigued.  Feeling nervous about going to sleep.  Not feeling rested in the morning.  Having trouble  concentrating.  Feeling irritable, anxious, or depressed.  How is this treated? Treatment for insomnia depends on the cause. If your insomnia is caused by an underlying condition, treatment will focus on addressing the condition. Treatment may also include:  Medicines to help you sleep.  Counseling or therapy.  Lifestyle adjustments.  Follow these instructions at home:  Take medicines only as directed by your health care provider.  Keep regular sleeping and waking hours. Avoid naps.  Keep a sleep diary to help you and your health care provider figure out what could be causing your insomnia. Include: ? When you sleep. ? When you wake up during the night. ? How well you sleep. ? How rested you feel the next day. ? Any side effects of medicines you are taking. ? What you eat and drink.  Make your bedroom a comfortable place where it is easy to fall asleep: ? Put up shades or special blackout curtains to block light from outside. ? Use a white noise machine to block noise. ? Keep the temperature cool.  Exercise regularly as directed by your health care provider. Avoid exercising right before bedtime.  Use relaxation techniques to manage stress. Ask your health care provider to suggest some techniques that may work well for you. These may include: ? Breathing exercises. ? Routines to release muscle tension. ? Visualizing peaceful scenes.  Cut back on alcohol, caffeinated beverages, and cigarettes, especially close to bedtime. These can disrupt your sleep.  Do not overeat or eat spicy foods right before bedtime. This can lead to digestive discomfort that can make it hard for you to sleep.  Limit screen use before bedtime. This includes: ? Watching TV. ? Using your smartphone, tablet, and computer.  Stick to a routine. This can help you fall asleep faster. Try to do a quiet activity, brush your teeth, and go to bed at the same time each night.  Get out of bed if you are  still awake after 15 minutes of trying to sleep. Keep the lights down, but try reading or doing a quiet activity. When you feel sleepy, go back to bed.  Make sure that you drive carefully. Avoid driving if you feel very sleepy.  Keep all follow-up appointments as directed by your health care provider. This is important. Contact a health care provider if:  You are tired throughout the day or have trouble in your daily routine due to sleepiness.  You continue to have sleep problems or your sleep problems get worse. Get help right away if:  You have serious thoughts about hurting yourself or someone else. This information is not intended to replace advice given to you by your health care provider. Make sure you discuss any questions you have with your health care provider. Document Released: 09/18/2000 Document Revised: 02/21/2016 Document Reviewed: 06/22/2014 Elsevier Interactive Patient Education  [employer].

## 2017-04-04 ENCOUNTER — Other Ambulatory Visit: Payer: Self-pay | Admitting: Internal Medicine

## 2017-04-05 NOTE — Telephone Encounter (Signed)
Spoke with patient and he states that he is doing well on the Wellbutrin and would like to continue.

## 2017-04-22 ENCOUNTER — Ambulatory Visit: Payer: BLUE CROSS/BLUE SHIELD | Admitting: Endocrinology

## 2017-04-24 ENCOUNTER — Other Ambulatory Visit: Payer: Self-pay | Admitting: Endocrinology

## 2017-05-12 ENCOUNTER — Other Ambulatory Visit: Payer: Self-pay | Admitting: Internal Medicine

## 2017-05-12 NOTE — Telephone Encounter (Signed)
Left a VM for patient to give the office a call back

## 2017-05-13 NOTE — Telephone Encounter (Signed)
Pt is returning torrie call

## 2017-05-14 NOTE — Telephone Encounter (Signed)
Left a VM for patient regarding is he still taking metformin.

## 2017-05-17 NOTE — Telephone Encounter (Signed)
Left a VM for patient to give the office a call back.

## 2017-05-24 NOTE — Progress Notes (Deleted)
No chief complaint on file.   HPI: Todd Rodriguez 41 y.o. come in for fu stress adjustment reaction situational insomnia  bereavement DM ht and tobacco t  ROS: See pertinent positives and negatives per HPI.  Past Medical History:  Diagnosis Date  . Diabetes mellitus without complication ([place])   . GERD (gastroesophageal reflux disease)   . Hypertension   . Pancreatitis     Family History  Problem Relation Age of Onset  . Diabetes Mother   . Hypertension Mother   . Diabetes Father   . Hypertension Father   . Arthritis Maternal Grandmother   . Hypertension Maternal Grandfather   . Diabetes Maternal Grandfather   . Colon cancer Neg Hx   . Pancreatitis Neg Hx   . Pancreatic cancer Neg Hx     Social History   Social History  . Marital status: Married    Spouse name: N/A  . Number of children: 1  . Years of education: N/A   Occupational History  .  [place] History Main Topics  . Smoking status: Current Every Day Smoker    Packs/day: 1.00  . Smokeless tobacco: Never Used  . Alcohol use Yes     Comment: every weekend  . Drug use: No  . Sexual activity: Yes    Partners: Female     Comment: Patient's Wife   Other Topics Concern  . Not on file   Social History Narrative   5 hours of sleep    2 people living in the home   A dog living in the home   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   Eye exam nany clark    FA tobacco some etoh neg rd exercises      Plays drummer in a band                 Outpatient Medications Prior to Visit  Medication Sig Dispense Refill  . amLODipine-benazepril (LOTREL) 10-40 MG capsule Take 1 capsule by mouth daily. 30 capsule 5  . atorvastatin (LIPITOR) 20 MG tablet TAKE 1 TABLET (20 MG TOTAL) BY MOUTH DAILY. 90 tablet 0  . buPROPion (WELLBUTRIN XL) 150 MG 24 hr tablet TAKE 1 TABLET (150 MG TOTAL) BY MOUTH DAILY INCREASE TO 2 TABLETS AFTER A WEEK. 60 tablet 3  . glimepiride  (AMARYL) 4 MG tablet Take 1 tablet (4 mg total) by mouth daily before breakfast. Increase to 6 mg per day after  4 weeks (Patient taking differently: Take 4 mg by mouth daily before breakfast. ) 45 tablet 3  . glucose blood (ONE TOUCH TEST STRIPS) test strip Use as instructed 100 each 11  . JARDIANCE 10 MG TABS tablet TAKE 1 TABLET BY MOUTH DAILY 30 tablet 1  . metFORMIN (GLUCOPHAGE-XR) 500 MG 24 hr tablet TAKE 2 TABLETS (1,000 MG TOTAL) BY MOUTH 2 (TWO) TIMES DAILY. 120 tablet 0  . mirtazapine (REMERON) 15 MG tablet Take 1-2 tablets (15-30 mg total) by mouth at bedtime. 30 tablet 1  . ondansetron (ZOFRAN ODT) 4 MG disintegrating tablet 69m ODT q4 hours prn nausea/vomit 12 tablet 0  . pantoprazole (PROTONIX) 40 MG tablet Take 1 tablet (40 mg total) by mouth 2 (two) times daily. (Patient taking differently: Take 40 mg by mouth daily. ) 180 tablet 1  . tadalafil (CIALIS) 20 MG tablet Take 1 tablet (20 mg total) by mouth daily as needed for erectile dysfunction. 10 tablet 3  No facility-administered medications prior to visit.      EXAM:  There were no vitals taken for this visit.  There is no height or weight on file to calculate BMI.  GENERAL: vitals reviewed and listed above, alert, oriented, appears well hydrated and in no acute distress HEENT: atraumatic, conjunctiva  clear, no obvious abnormalities on inspection of external nose and ears OP : no lesion edema or exudate  NECK: no obvious masses on inspection palpation  LUNGS: clear to auscultation bilaterally, no wheezes, rales or rhonchi, good air movement CV: HRRR, no clubbing cyanosis or  peripheral edema nl cap refill  MS: moves all extremities without noticeable focal  abnormality PSYCH: pleasant and cooperative, no obvious depression or anxiety Lab Results  Component Value Date   WBC 11.5 (H) 01/27/2017   HGB 16.6 01/27/2017   HCT 47.9 01/27/2017   PLT 224 01/27/2017   GLUCOSE 109 (H) 01/27/2017   CHOL 191 01/07/2016   TRIG  215.0 (H) 01/07/2016   HDL 40.80 01/07/2016   LDLDIRECT 115.0 01/07/2016   LDLCALC 50 04/12/2014   ALT 35 01/27/2017   AST 27 01/27/2017   NA 132 (L) 01/27/2017   K 4.3 01/27/2017   CL 96 (L) 01/27/2017   CREATININE 1.05 01/27/2017   BUN 7 01/27/2017   CO2 23 01/27/2017   TSH 1.39 01/07/2016   PSA 0.76 01/07/2016   HGBA1C 9.4 12/08/2016   MICROALBUR 1.5 12/17/2016   BP Readings from Last 3 Encounters:  03/09/17 130/80  02/16/17 (!) 150/90  01/27/17 (!) 158/98   Wt Readings from Last 3 Encounters:  03/09/17 211 lb 14.4 oz (96.1 kg)  02/16/17 214 lb 3.2 oz (97.2 kg)  01/27/17 215 lb (97.5 kg)    There were no vitals filed for this visit.  ASSESSMENT AND PLAN:  Discussed the following assessment and plan:  No diagnosis found. Lipid  a1c   -Patient advised to return or notify health care team  if  new concerns arise.  There are no Patient Instructions on file for this visit.   Standley Brooking. Roshelle Traub M.D.

## 2017-05-25 ENCOUNTER — Encounter: Payer: Self-pay | Admitting: Internal Medicine

## 2017-05-25 ENCOUNTER — Ambulatory Visit (INDEPENDENT_AMBULATORY_CARE_PROVIDER_SITE_OTHER): Payer: BLUE CROSS/BLUE SHIELD | Admitting: Internal Medicine

## 2017-05-25 ENCOUNTER — Ambulatory Visit: Payer: BLUE CROSS/BLUE SHIELD | Admitting: Internal Medicine

## 2017-05-25 VITALS — BP 130/80 | HR 94 | Temp 98.3°F | Wt 218.6 lb

## 2017-05-25 DIAGNOSIS — I10 Essential (primary) hypertension: Secondary | ICD-10-CM | POA: Diagnosis not present

## 2017-05-25 DIAGNOSIS — Z79899 Other long term (current) drug therapy: Secondary | ICD-10-CM

## 2017-05-25 DIAGNOSIS — F5109 Other insomnia not due to a substance or known physiological condition: Secondary | ICD-10-CM | POA: Diagnosis not present

## 2017-05-25 MED ORDER — ZALEPLON 10 MG PO CAPS: 10.0000 mg | ORAL_CAPSULE | Freq: Every evening | ORAL | 0 refills | Status: DC | PRN

## 2017-05-25 NOTE — Patient Instructions (Addendum)
  Try a different sleep medication   That is short acting for   Sleep aid       This can be habit forming   So try   lmimt to 3-4 days per week .   Let us know if helping for refill.   ROV in 2-3 months  If  Helping some .  otheriwise call for advice .

## 2017-05-25 NOTE — Progress Notes (Signed)
Chief Complaint  Patient presents with  . Follow-up    HPI: Todd Rodriguez 41 y.o. come in for Chronic disease management  Sleep about the same   Running  Make time had shot in neck from [place] .      Mirtazapine for 2 days .  sleep 30 minutes    An hour afte 2  .   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  Past Medical History:  Diagnosis Date  . Diabetes mellitus without complication ([place])   . GERD (gastroesophageal reflux disease)   . Hypertension   . Pancreatitis     Family History  Problem Relation Age of Onset  . Diabetes Mother   . Hypertension Mother   . Diabetes Father   . Hypertension Father   . Arthritis Maternal Grandmother   . Hypertension Maternal Grandfather   . Diabetes Maternal Grandfather   . Colon cancer Neg Hx   . Pancreatitis Neg Hx   . Pancreatic cancer Neg Hx     Social History   Social History  . Marital status: Married    Spouse name: N/A  . Number of children: 1  . Years of education: N/A   Occupational History  .  [place] History Main Topics  . Smoking status: Current Every Day Smoker    Packs/day: 1.00  . Smokeless tobacco: Never Used  . Alcohol use Yes     Comment: every weekend  . Drug use: No  . Sexual activity: Yes    Partners: Female     Comment: Patient's Wife   Other Topics Concern  . 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 amLODipine-benazepril (LOTREL) 10-40  MG capsule Take 1 capsule by mouth daily. 30 capsule 5  . atorvastatin (LIPITOR) 20 MG tablet TAKE 1 TABLET (20 MG TOTAL) BY MOUTH DAILY. 90 tablet 0  . buPROPion (WELLBUTRIN XL) 150 MG 24 hr tablet TAKE 1 TABLET (150 MG TOTAL) BY MOUTH DAILY INCREASE TO 2 TABLETS AFTER A WEEK. 60 tablet 3  . glimepiride (AMARYL) 4 MG tablet Take 1 tablet (4 mg total) by mouth daily before breakfast. Increase to 6 mg per day after  4 weeks (Patient taking differently: Take 4 mg by mouth daily before breakfast. ) 45 tablet 3  . glucose blood (ONE TOUCH TEST STRIPS) test strip Use as instructed 100 each 11  . JARDIANCE 10 MG TABS tablet TAKE 1 TABLET BY MOUTH DAILY 30 tablet 1  . metFORMIN (GLUCOPHAGE-XR) 500 MG 24 hr tablet TAKE 2 TABLETS (1,000 MG TOTAL) BY MOUTH 2 (TWO) TIMES DAILY. 120 tablet 0  . mirtazapine (REMERON) 15 MG tablet Take 1-2 tablets (15-30 mg total) by  mouth at bedtime. 30 tablet 1  . ondansetron (ZOFRAN ODT) 4 MG disintegrating tablet 107m ODT q4 hours prn nausea/vomit 12 tablet 0  . pantoprazole (PROTONIX) 40 MG tablet Take 1 tablet (40 mg total) by mouth 2 (two) times daily. (Patient taking differently: Take 40 mg by mouth daily. ) 180 tablet 1  . tadalafil (CIALIS) 20 MG tablet Take 1 tablet (20 mg total) by mouth daily as needed for erectile dysfunction. 10 tablet 3   No facility-administered medications prior to visit.      EXAM:  BP 130/80 (BP Location: Right Arm, Patient Position: Sitting, Cuff Size: Normal)   Pulse 94   Temp 98.3 F (36.8 C) (Oral)   Wt 218 lb 9.6 oz (99.2 kg)   BMI 28.07 kg/m   Body mass index is 28.07 kg/m.  GENERAL: vitals reviewed and listed above, alert, oriented, appears well hydrated and in no acute distress HEENT: atraumatic, conjunctiva  clear, no obvious abnormalities on inspection of external nose and ears NECK: no obvious masses on inspection palpation  PSYCH: pleasant and cooperative, no obvious depression or anxiety stressed but nl speech and    BP Readings from Last 3 Encounters:  05/25/17 130/80  03/09/17 130/80  02/16/17 (!) 150/90   Wt Readings from Last 3 Encounters:  05/25/17 218 lb 9.6 oz (99.2 kg)  03/09/17 211 lb 14.4 oz (96.1 kg)  02/16/17 214 lb 3.2 oz (97.2 kg)    ASSESSMENT AND PLAN:  Discussed the following assessment and plan:  Situational insomnia - add intermittent sonata  sa sleep aid  Medication management  Essential hypertension, benign - better remeron not much help     Risk benefit of medication discussed. Try  Short acting  Sleep aid because of  Sleep shifting   Stress reduction sto advised  No time for  Other help  Emphasized getting help with  Diabetes also   He sign up for my  Chart for  fo info  [place] in [place] other places  -Patient advised to return or notify health care team  if  new concerns arise.  Patient Instructions   Try a different sleep medication   That is short acting for   Sleep aid       This can be habit forming   So try   lmimt to 3-4 days per week .   Let uKoreaknow if helping for refill.   ROV in 2-3 months  If  Helping some .  otheriwise call for advice .        WStandley Brooking Keyshon Stein M.D.

## 2017-05-26 ENCOUNTER — Other Ambulatory Visit: Payer: Self-pay | Admitting: Internal Medicine

## 2017-06-23 ENCOUNTER — Other Ambulatory Visit: Payer: Self-pay | Admitting: Endocrinology

## 2017-06-25 ENCOUNTER — Encounter: Payer: Self-pay | Admitting: Internal Medicine

## 2017-07-01 ENCOUNTER — Other Ambulatory Visit: Payer: Self-pay

## 2017-07-01 MED ORDER — EMPAGLIFLOZIN 10 MG PO TABS: 10.0000 mg | ORAL_TABLET | Freq: Every day | ORAL | 1 refills | Status: DC

## 2017-07-05 ENCOUNTER — Other Ambulatory Visit: Payer: Self-pay | Admitting: Orthopedic Surgery

## 2017-07-05 DIAGNOSIS — M50323 Other cervical disc degeneration at C6-C7 level: Secondary | ICD-10-CM

## 2017-07-09 ENCOUNTER — Other Ambulatory Visit: Payer: Self-pay | Admitting: Internal Medicine

## 2017-07-10 ENCOUNTER — Other Ambulatory Visit: Payer: Self-pay | Admitting: Internal Medicine

## 2017-07-16 ENCOUNTER — Other Ambulatory Visit: Payer: BLUE CROSS/BLUE SHIELD

## 2017-07-16 ENCOUNTER — Inpatient Hospital Stay
Admission: RE | Admit: 2017-07-16 | Discharge: 2017-07-16 | Disposition: A | Discharge status: Home / Self Care | Payer: BLUE CROSS/BLUE SHIELD | Source: Ambulatory Visit | Attending: Orthopedic Surgery | Admitting: Orthopedic Surgery

## 2017-07-16 NOTE — Discharge Instructions (Signed)
Myel[facility] Discharge Instructions  1. Go home and rest quietly for the next 24 hours.  It is important to lie flat for the next 24 hours.  Get up only to go to the restroom.  You may lie in the bed or on a couch on your back, your stomach, your left side or your right side.  You may have one pillow under your head.  You may have pillows between your knees while you are on your side or under your knees while you are on your back.  2. DO NOT drive today.  Recline the seat as far back as it will go, while still wearing your seat belt, on the way home.  3. You may get up to go to the bathroom as needed.  You may sit up for 10 minutes to eat.  You may resume your normal diet and medications unless otherwise indicated.  Drink plenty of extra fluids today and tomorrow.  4. The incidence of a spinal headache with nausea and/or vomiting is about 5% (one in 20 patients).  If you develop a headache, lie flat and drink plenty of fluids until the headache goes away.  Caffeinated beverages may be helpful.  If you develop severe nausea and vomiting or a headache that does not go away with flat bed rest, call [phone].  5. You may resume normal activities after your 24 hours of bed rest is over; however, do not exert yourself strongly or do any heavy lifting tomorrow.  6. Call your physician for a follow-up appointment.    You may resume Remeron and Wellbutrin on Saturday, July 16, 2017 after 10:30a.m.

## 2017-07-27 ENCOUNTER — Ambulatory Visit
Admission: RE | Admit: 2017-07-27 | Discharge: 2017-07-27 | Disposition: A | Discharge status: Home / Self Care | Payer: BLUE CROSS/BLUE SHIELD | Source: Ambulatory Visit | Attending: Orthopedic Surgery | Admitting: Orthopedic Surgery

## 2017-07-27 DIAGNOSIS — M50323 Other cervical disc degeneration at C6-C7 level: Secondary | ICD-10-CM

## 2017-07-27 MED ORDER — DIAZEPAM 5 MG PO TABS
10.0000 mg | ORAL_TABLET | Freq: Once | ORAL | Status: AC
  Administered 2017-07-27: 10 mg via ORAL

## 2017-07-27 MED ORDER — IOPAMIDOL (ISOVUE-M 300) INJECTION 61%
10.0000 mL | Freq: Once | INTRAMUSCULAR | Status: AC | PRN
  Administered 2017-07-27: 10 mL via INTRATHECAL

## 2017-07-27 NOTE — Discharge Instructions (Signed)
Myelogram Discharge Instructions  1. Go home and rest quietly for the next 24 hours.  It is important to lie flat for the next 24 hours.  Get up only to go to the restroom.  You may lie in the bed or on a couch on your back, your stomach, your left side or your right side.  You may have one pillow under your head.  You may have pillows between your knees while you are on your side or under your knees while you are on your back.  2. DO NOT drive today.  Recline the seat as far back as it will go, while still wearing your seat belt, on the way home.  3. You may get up to go to the bathroom as needed.  You may sit up for 10 minutes to eat.  You may resume your normal diet and medications unless otherwise indicated.  Drink lots of extra fluids today and tomorrow.  4. The incidence of headache, nausea, or vomiting is about 5% (one in 20 patients).  If you develop a headache, lie flat and drink plenty of fluids until the headache goes away.  Caffeinated beverages may be helpful.  If you develop severe nausea and vomiting or a headache that does not go away with flat bed rest, call [phone].  5. You may resume normal activities after your 24 hours of bed rest is over; however, do not exert yourself strongly or do any heavy lifting tomorrow. If when you get up you have a headache when standing, go back to bed and force fluids for another 24 hours.  6. Call your physician for a follow-up appointment.  The results of your myelogram will be sent directly to your physician by the following day.  7. If you have any questions or if complications develop after you arrive home, please call [phone].  Discharge instructions have been explained to the patient.  The patient, or the person responsible for the patient, fully understands these instructions.       May resume BC's today.  May resume Wellbutrin and Remeron on Oct. 24, 2018, after 10:30 am.

## 2017-07-27 NOTE — Progress Notes (Signed)
Pt states he has bee off BC's for the past 3 days and off Wellbutrin and Remeron for the past 2 days.

## 2017-07-29 ENCOUNTER — Other Ambulatory Visit: Payer: Self-pay | Admitting: Internal Medicine

## 2017-08-20 ENCOUNTER — Other Ambulatory Visit: Payer: Self-pay | Admitting: Internal Medicine

## 2017-08-25 ENCOUNTER — Ambulatory Visit: Payer: BLUE CROSS/BLUE SHIELD | Admitting: Internal Medicine

## 2017-09-03 ENCOUNTER — Encounter: Payer: Self-pay | Admitting: Internal Medicine

## 2017-09-17 ENCOUNTER — Other Ambulatory Visit: Payer: Self-pay | Admitting: Endocrinology

## 2017-09-17 NOTE — Telephone Encounter (Signed)
Please refuse with note to make appointment

## 2017-09-17 NOTE — Telephone Encounter (Signed)
Last ov 12/17/16 2 canceled 1 no-show- refill or refuse please advise

## 2017-09-17 NOTE — Telephone Encounter (Signed)
Done

## 2017-10-22 DIAGNOSIS — Z981 Arthrodesis status: Secondary | ICD-10-CM | POA: Insufficient documentation

## 2017-11-11 DIAGNOSIS — M5412 Radiculopathy, cervical region: Secondary | ICD-10-CM | POA: Insufficient documentation

## 2017-11-22 ENCOUNTER — Other Ambulatory Visit: Payer: Self-pay | Admitting: Internal Medicine

## 2017-12-10 DIAGNOSIS — G5602 Carpal tunnel syndrome, left upper limb: Secondary | ICD-10-CM | POA: Insufficient documentation

## 2018-01-03 ENCOUNTER — Ambulatory Visit: Payer: BLUE CROSS/BLUE SHIELD | Admitting: Internal Medicine

## 2018-01-03 ENCOUNTER — Encounter: Payer: Self-pay | Admitting: Internal Medicine

## 2018-01-03 VITALS — BP 112/70 | HR 97 | Temp 98.3°F | Wt 212.3 lb

## 2018-01-03 DIAGNOSIS — M4802 Spinal stenosis, cervical region: Secondary | ICD-10-CM

## 2018-01-03 DIAGNOSIS — I10 Essential (primary) hypertension: Secondary | ICD-10-CM | POA: Diagnosis not present

## 2018-01-03 DIAGNOSIS — Z01818 Encounter for other preprocedural examination: Secondary | ICD-10-CM

## 2018-01-03 DIAGNOSIS — G992 Myelopathy in diseases classified elsewhere: Secondary | ICD-10-CM | POA: Diagnosis not present

## 2018-01-03 DIAGNOSIS — IMO0001 Reserved for inherently not codable concepts without codable children: Secondary | ICD-10-CM

## 2018-01-03 DIAGNOSIS — E1165 Type 2 diabetes mellitus with hyperglycemia: Secondary | ICD-10-CM

## 2018-01-03 DIAGNOSIS — E119 Type 2 diabetes mellitus without complications: Secondary | ICD-10-CM

## 2018-01-03 DIAGNOSIS — F17200 Nicotine dependence, unspecified, uncomplicated: Secondary | ICD-10-CM

## 2018-01-03 DIAGNOSIS — Z79899 Other long term (current) drug therapy: Secondary | ICD-10-CM

## 2018-01-03 LAB — POCT GLYCOSYLATED HEMOGLOBIN (HGB A1C): Hemoglobin A1C: 7.3

## 2018-01-03 MED ORDER — EMPAGLIFLOZIN 10 MG PO TABS
10.0000 mg | ORAL_TABLET | Freq: Every day | ORAL | 3 refills | Status: DC
Start: 2018-01-03 — End: 2018-06-19

## 2018-01-03 NOTE — Patient Instructions (Addendum)
Refilled the jardiance   X 3 for now.  Your hg a1c is  7.3 and acceptable .   Due for  Blood monitoring  End April  But can  Check    After surgery .   Plan ROV   After rehab  Summer July .   Will  be due for blood work monitoring.  No contraindications for surgery  At this time . Will send  A  Copy of this note to your surgeon that you are medically cleared for surgery .

## 2018-01-03 NOTE — Progress Notes (Signed)
Chief Complaint  Patient presents with  . Surgical Clearance    Disc repair - neck. Surgery is scheduled for 01/12/18.     HPI: Todd Rodriguez 42 y.o. come in for    Urgent surgical    c spine  For cervical stenosis with left arm weakness    Blood sugar  Not checked   in a while.  But had been ok  No lows    Ran out  Of jardiance     For a few weeks.?  Not know why not refilled   No med  Change  X holding the lipitor   BP has   been in control .  Same meds   No neuro sx except  Related to  The myelopathy .   Has had rx with pt and injections of no help.   ROS: See pertinent pos no cps ob   Heart lung sx  Or  Other neuro sx.   Med list updated  No new pancreatitis .   No foot ulcers  visual changes   Tobacco    About 1 ppd .  Plans to quit with surgery  No etoh.  Prev c spine surgery dont in [place] years ago  Dr Patrice Paradise who now lives in [place].  itives and negatives per HPI.  Past Medical History:  Diagnosis Date  . Diabetes mellitus without complication ([place])   . GERD (gastroesophageal reflux disease)   . Hypertension   . Pancreatitis     Family History  Problem Relation Age of Onset  . Diabetes Mother   . Hypertension Mother   . Diabetes Father   . Hypertension Father   . Arthritis Maternal Grandmother   . Hypertension Maternal Grandfather   . Diabetes Maternal Grandfather   . Colon cancer Neg Hx   . Pancreatitis Neg Hx   . Pancreatic cancer Neg Hx     Social History   Socioeconomic History  . Marital status: Married    Spouse name: Not on file  . Number of children: 1  . Years of education: Not on file  . Highest education level: Not on file  Occupational History    Employer: [employer]  . Financial resource strain: Not on file  . Food insecurity:    Worry: Not on file    Inability: Not on file  . Transportation needs:    Medical: Not on file    Non-medical: Not on file  Tobacco Use  . Smoking status: Current Every Day  Smoker    Packs/day: 1.00  . Smokeless tobacco: Never Used  Substance and Sexual Activity  . Alcohol use: Yes    Comment: every weekend  . Drug use: No  . Sexual activity: Yes    Partners: Female    Comment: Patient's Wife  Lifestyle  . Physical activity:    Days per week: Not on file    Minutes per session: Not on file  . Stress: Not on file  Relationships  . Social connections:    Talks on phone: Not on file    Gets together: Not on file    Attends religious service: Not on file    Active member of club or organization: Not on file    Attends meetings of clubs or organizations: Not on file    Relationship status: Not on file  Other Topics Concern  . 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 Always use your most recent med list.          amLODipine-benazepril 5-20 MG capsule Commonly known as:  LOTREL TAKE 1 CAPSULE BY MOUTH DAILY.   atorvastatin 20 MG tablet Commonly known as:  LIPITOR TAKE 1 TABLET (20 MG TOTAL) BY MOUTH DAILY.   empagliflozin 10 MG Tabs tablet Commonly known as:  JARDIANCE Take 10 mg by mouth daily.   FLAX SEEDS PO Take 1 capsule by mouth daily.   glimepiride 4 MG tablet Commonly known as:  AMARYL Take 1.5 tablets (6 mg total) by mouth daily before breakfast.   glucose blood test strip Commonly known as:  ONE TOUCH TEST STRIPS Use as instructed   ibuprofen 200 MG tablet Commonly known as:  ADVIL,MOTRIN Take 400 mg by mouth every 6 (six) hours as needed for headache or moderate pain.   metFORMIN 500 MG 24 hr tablet Commonly known as:  GLUCOPHAGE-XR TAKE 2 TABLETS (1,000  MG TOTAL) BY MOUTH 2 (TWO) TIMES DAILY.   OVER THE COUNTER MEDICATION Take 1 capsule by mouth daily. Nugenix  otc supplement   pantoprazole 40 MG tablet Commonly known as:  PROTONIX TAKE 1 TABLET BY MOUTH TWICE A DAY         Lab Results  Component Value Date   HGBA1C 7.3 01/03/2018     EXAM:  BP 112/70 (BP Location: Left Arm, Patient Position: Sitting, Cuff Size: Large)   Pulse 97   Temp 98.3 F (36.8 C) (Oral)   Wt 212 lb 4.8 oz (96.3 kg)   BMI 27.26 kg/m   Body mass index is 27.26 kg/m.  GENERAL: vitals reviewed and listed above, alert, oriented, appears well hydrated and in no acute distress HEENT: atraumatic, conjunctiva  clear, no obvious abnormalities on inspection of external nose and ears tms cledar  NECK: no obvious masses on inspection palpation  LUNGS: clear to auscultation bilaterally, no wheezes, rales or rhonchi, good air movement CV: HRRR, no clubbing cyanosis or  peripheral edema nl cap refill  Abdomen:  Sof,t normal bowel sounds without hepatosplenomegaly, no guarding rebound or masses no CVA tenderness MS: moves all extremities no gross atrophy   PSYCH: pleasant and cooperative, no obvious depression or anxiety  BP Readings from Last 3 Encounters:  01/03/18 112/70  07/27/17 (!) 143/88  05/25/17 130/80   Mri report copiesd to scan   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   Uncontrolled type 2 diabetes mellitus without complication, without long-term current use of insulin ([place]) - now accpetable but goal below  7 as  tolerated  - Plan: POC HgB A1c  Essential hypertension, benign  Medication management  Controlled type 2 diabetes mellitus without complication, without long-term current use of insulin (HCC)  Tobacco  dependence  -Patient advised  to return or notify health care team  if  new concerns arise.  Patient Instructions  Refilled the jardiance   X 3 for now.  Your hg a1c is  7.3 and acceptable .   Due for  Blood monitoring  End April  But can  Check    After surgery .   Plan ROV   After rehab  Summer July .   Will  be due for blood work monitoring.  No contraindications for surgery  At this time . Will send  A  Copy of this note to your surgeon that you are medically cleared for surgery .  Standley Brooking. Donal Lynam M.D.

## 2018-01-04 NOTE — Pre-Procedure Instructions (Signed)
Todd Rodriguez  01/04/2018      [id number]: [phone]: [phone]   Your procedure is scheduled on Wed., January 12, 2018  [facility]  Call this number if you have problems the morning of surgery:  [phone]  Remember:  Do not eat food or drink liquids after midnight.  Take these medicines the morning of surgery with A SIP OF WATER: Pantoprazole (PROTONIX)  As of today, stop taking all Aspirins, Vitamins, Fish oils, and Herbal medications. Also stop all NSAIDS i.e. Advil, Ibuprofen, Motrin, Aleve, Anaprox, Naproxen, BC and Goody Powders.  How to Manage Your Diabetes Before and After Surgery  Why is it important to control my blood sugar before and after surgery? . Improving blood sugar levels before and after surgery helps healing and can limit problems. . A way of improving blood sugar control is eating a healthy diet by: o  Eating less sugar and carbohydrates o  Increasing activity/exercise o  Talking with your doctor about reaching your blood sugar goals . High blood sugars (greater than 180 mg/dL) can raise your risk of infections and slow your recovery, so you will need to focus on controlling your diabetes during the weeks before surgery. . Make sure that the doctor who takes care of your diabetes knows about your planned surgery including the date and location.  How do I manage my blood sugar before surgery? . Check your blood sugar at least 4 times a day, starting 2 days before surgery, to make sure that the level is not too high or low. o Check your blood sugar the morning of your surgery when you wake up and every 2 hours until you get to the Short Stay unit. . If your blood sugar is less than 70 mg/dL, you will need to treat for low blood sugar: o Do not take insulin. o Treat a low  blood sugar (less than 70 mg/dL) with  cup of clear juice (cranberry or apple), 4 glucose tablets, OR glucose gel. Recheck blood sugar in 15 minutes after treatment (to make sure it is greater than 70 mg/dL). If your blood sugar is not greater than 70 mg/dL on recheck, call [phone]  for further instructions. . Report your blood sugar to the short stay nurse when you get to Short Stay.  . If you are admitted to the hospital after surgery: o Your blood sugar will be checked by the staff and you will probably be given insulin after surgery (instead of oral diabetes medicines) to make sure you have good blood sugar levels. o The goal for blood sugar control after surgery is 80-180 mg/dL.  WHAT DO I DO ABOUT MY DIABETES MEDICATION?  .Marland KitchenDo not take Empagliflozin (JARDIANCE), Glimepiride (AMARYL), and MetFORMIN (GLUCOPHAGE-XR) the morning of surgery.  . If your CBG is greater than 220 mg/dL, call u[place]at [phone]  Do not wear jewelry.  Do not wear lotions, powders, colognes, or deodorant.  Do not shave 48 hours prior to surgery.  Men may shave face.  Do not bring valuables to the hospital.  [facility] belongings or [facility]ables.  Contacts, dentures or bridgework may not be worn into surgery.  Leave your suitcase in the car.  After surgery it may be brought to your room.  For patients  admitted to the hospital, discharge time will be determined by your treatment team.  Patients discharged the day of surgery will not be allowed to drive home.   Special instructions: [facility]- Preparing For Surgery  Before surgery, you can play an important role. Because skin is not sterile, your skin needs to be as free of germs as possible. You can reduce the number of germs on your skin by washing with CHG (chlorahexidine gluconate) Soap before surgery.  CHG is an antiseptic cleaner which kills germs and bonds with the skin to continue killing germs even after  washing.  Please do not use if you have an allergy to CHG or antibacterial soaps. If your skin becomes reddened/irritated stop using the CHG.  Do not shave (including legs and underarms) for at least 48 hours prior to first CHG shower. It is OK to shave your face.  Please follow these instructions carefully.   1. Shower the NIGHT BEFORE SURGERY and the MORNING OF SURGERY with CHG.   2. If you chose to wash your hair, wash your hair first as usual with your normal shampoo.  3. After you shampoo, rinse your hair and body thoroughly to remove the shampoo.  4. Use CHG as you would any other liquid soap. You can apply CHG directly to the skin and wash gently with a scrungie or a clean washcloth.   5. Apply the CHG Soap to your body ONLY FROM THE NECK DOWN.  Do not use on open wounds or open sores. Avoid contact with your eyes, ears, mouth and genitals (private parts). Wash Face and genitals (private parts)  with your normal soap.  6. Wash thoroughly, paying special attention to the area where your surgery will be performed.  7. Thoroughly rinse your body with warm water from the neck down.  8. DO NOT shower/wash with your normal soap after using and rinsing off the CHG Soap.  9. Pat yourself dry with a CLEAN TOWEL.  10. Wear CLEAN PAJAMAS to bed the night before surgery, wear comfortable clothes the morning of surgery  11. Place CLEAN SHEETS on your bed the night of your first shower and DO NOT SLEEP WITH PETS.  Day of Surgery: Do not apply any deodorants/lotions. Please wear clean clothes to the hospital/surgery center.    Please read over the following fact sheets that you were given. Pain Booklet, Coughing and Deep Breathing, MRSA Information and Surgical Site Infection Prevention

## 2018-01-05 ENCOUNTER — Encounter (HOSPITAL_COMMUNITY)
Admission: RE | Admit: 2018-01-05 | Discharge: 2018-01-05 | Disposition: A | Discharge status: Home / Self Care | Payer: BLUE CROSS/BLUE SHIELD | Source: Ambulatory Visit | Attending: Orthopedic Surgery | Admitting: Orthopedic Surgery

## 2018-01-05 ENCOUNTER — Encounter (HOSPITAL_COMMUNITY): Payer: Self-pay

## 2018-01-05 ENCOUNTER — Other Ambulatory Visit: Payer: Self-pay

## 2018-01-05 DIAGNOSIS — Z981 Arthrodesis status: Secondary | ICD-10-CM | POA: Diagnosis not present

## 2018-01-05 DIAGNOSIS — Z7984 Long term (current) use of oral hypoglycemic drugs: Secondary | ICD-10-CM | POA: Diagnosis not present

## 2018-01-05 DIAGNOSIS — Z79899 Other long term (current) drug therapy: Secondary | ICD-10-CM | POA: Insufficient documentation

## 2018-01-05 DIAGNOSIS — Z87891 Personal history of nicotine dependence: Secondary | ICD-10-CM | POA: Insufficient documentation

## 2018-01-05 DIAGNOSIS — Z01812 Encounter for preprocedural laboratory examination: Secondary | ICD-10-CM | POA: Diagnosis not present

## 2018-01-05 DIAGNOSIS — I10 Essential (primary) hypertension: Secondary | ICD-10-CM | POA: Insufficient documentation

## 2018-01-05 DIAGNOSIS — M1990 Unspecified osteoarthritis, unspecified site: Secondary | ICD-10-CM | POA: Diagnosis not present

## 2018-01-05 DIAGNOSIS — Z01818 Encounter for other preprocedural examination: Secondary | ICD-10-CM | POA: Insufficient documentation

## 2018-01-05 DIAGNOSIS — Z8719 Personal history of other diseases of the digestive system: Secondary | ICD-10-CM | POA: Diagnosis not present

## 2018-01-05 DIAGNOSIS — E119 Type 2 diabetes mellitus without complications: Secondary | ICD-10-CM | POA: Insufficient documentation

## 2018-01-05 DIAGNOSIS — K219 Gastro-esophageal reflux disease without esophagitis: Secondary | ICD-10-CM | POA: Insufficient documentation

## 2018-01-05 HISTORY — DX: Unspecified osteoarthritis, unspecified site: M19.90

## 2018-01-05 LAB — SURGICAL PCR SCREEN
MRSA, PCR: NEGATIVE
STAPHYLOCOCCUS AUREUS: NEGATIVE

## 2018-01-05 LAB — BASIC METABOLIC PANEL
ANION GAP: 10 (ref 5–15)
BUN: 8 mg/dL (ref 6–20)
CALCIUM: 9.4 mg/dL (ref 8.9–10.3)
CO2: 24 mmol/L (ref 22–32)
Chloride: 100 mmol/L — ABNORMAL LOW (ref 101–111)
Creatinine, Ser: 0.95 mg/dL (ref 0.61–1.24)
GFR calc Af Amer: >60 mL/min (ref >60)
GLUCOSE: 219 mg/dL — AB (ref 65–99)
POTASSIUM: 4.5 mmol/L (ref 3.5–5.1)
SODIUM: 134 mmol/L — AB (ref 135–145)

## 2018-01-05 LAB — CBC
HCT: 49.2 % (ref 39.0–52.0)
HEMOGLOBIN: 16.9 g/dL (ref 13.0–17.0)
MCH: 33.2 pg (ref 26.0–34.0)
MCHC: 34.3 g/dL (ref 30.0–36.0)
MCV: 96.7 fL (ref 78.0–100.0)
Platelets: 191 10*3/uL (ref 150–400)
RBC: 5.09 MIL/uL (ref 4.22–5.81)
RDW: 12.6 % (ref 11.5–15.5)
WBC: 6.6 10*3/uL (ref 4.0–10.5)

## 2018-01-05 LAB — GLUCOSE, CAPILLARY: Glucose-Capillary: 261 mg/dL — ABNORMAL HIGH (ref 65–99)

## 2018-01-05 NOTE — Progress Notes (Signed)
PCP: Dr. Regis Bill Cardiologist: Denies  EKG: Today CXR: Denies ECHO: Denies Stress Test: Denies Cardiac Cath: Denies  CBG Today: 261, reports just ate "burger at [employer]" Hbg A1C: 01/03/18: 7.3 Fasting Blood Sugar- typically 120's Checks Blood Sugar daily  Patient denies shortness of breath, fever, cough, and chest pain at PAT appointment.  Patient verbalized understanding of instructions provided today at the PAT appointment.  Patient asked to review instructions at home and day of surgery.

## 2018-01-06 NOTE — Progress Notes (Signed)
Anesthesia Chart Review: Patient is a 42 year old male scheduled for C4-5 ACDF, removal of hardware and exploration of C-6 fusion on 01/12/18 by Dr. [name].  History includes smoking, hypertension, diabetes mellitus type 2, pancreatitis, GERD, arthritis, cervical fusion.   - PCP is Dr. Shanon Ace, last visit 01/03/18 for preoperative evaluation.  No contraindications for surgery at that time. - Endocrinologist is Dr. Elayne Snare. - Dr. Rolena Infante sent patient to ENT Dr. Melissa Montane on 3/11/9 to evaluate vocal cord function prior to c-spine surgery.  Transnasal laryngoscopy showed excellent bilateral vocal cord motion.  Meds include amlodipine-benazepril, Lipitor (on hold), Jardiance, flaxseed, Amaryl, metformin, Protonix, Nugenix supplement.  BP (!) 152/88   Pulse 94   Temp 36.8 C (Oral)   Resp 18   Ht 6' 2"  (1.88 m)   Wt 217 lb 6 oz (98.6 kg)   SpO2 100%   BMI 27.91 kg/m   EKG 01/05/18: Normal sinus rhythm, septal infarct, age undetermined versus lead placement.  No significant change since last tracing 10/24/13.  CT c-spine 07/27/17: IMPRESSION: 1. Postsurgical changes related to prior C5-C7 ACDF with partial osseous fusion at C5-C6 and complete osseous fusion at C6-C7. No evidence of hardware complication. 2. Multilevel degenerative changes of the cervical spine as described above, worst at C4-C5 where there is moderate central spinal canal stenosis with flattening of the ventral cord and moderate bilateral neuroforaminal stenosis. 3. Additional moderate neuroforaminal stenosis on the right at C5-C6. Mild bilateral neuroforaminal stenosis at C3-C4 and C6-C7.  Preoperative labs noted.  A1c 7.3 on 01/03/2018.  He reported fasting blood sugars typically in the 120s.  Recent preoperative evaluation by PCP. Patient denies shortness of breath, fever, cough, and chest pain at PAT.  If no acute changes and anticipate that he can proceed as planned.  George Hugh [facility] Short Stay  Center/Anesthesiology Phone [phone] 01/06/2018 5:25 PM

## 2018-01-07 NOTE — H&P (Addendum)
Patient ID: Todd Rodriguez MRN: [id number] DOB/AGE: 07-Sep-1976 42 y.o.  Admit date: (Not on file)  Admission Diagnoses:  Cervical Stenosis  HPI: History and Physical for ACDF C4-5, removal of hardware C5-6 and exploration of fusion on 01/12/2018.  Pt last A1c was 7.3.  Pt is planning on smoking cessation next week.  Past Medical History: Past Medical History:  Diagnosis Date  . Arthritis   . Diabetes mellitus without complication ([place])   . GERD (gastroesophageal reflux disease)   . Hypertension   . Pancreatitis     Surgical History: Past Surgical History:  Procedure Laterality Date  . NECK SURGERY     2 disc  . SPINAL FUSION      Family History: Family History  Problem Relation Age of Onset  . Diabetes Mother   . Hypertension Mother   . Diabetes Father   . Hypertension Father   . Arthritis Maternal Grandmother   . Hypertension Maternal Grandfather   . Diabetes Maternal Grandfather   . Colon cancer Neg Hx   . Pancreatitis Neg Hx   . Pancreatic cancer Neg Hx     Social History: Social History   Socioeconomic History  . Marital status: Married    Spouse name: Not on file  . Number of children: 1  . Years of education: Not on file  . Highest education level: Not on file  Occupational History    Employer: [employer]  . Financial resource strain: Not on file  . Food insecurity:    Worry: Not on file    Inability: Not on file  . Transportation needs:    Medical: Not on file    Non-medical: Not on file  Tobacco Use  . Smoking status: Current Every Day Smoker    Packs/day: 1.00  . Smokeless tobacco: Never Used  Substance and Sexual Activity  . Alcohol use: Yes    Comment: every weekend  . Drug use: No  . Sexual activity: Yes    Partners: Female    Comment: Patient's Wife  Lifestyle  . Physical activity:    Days per week: Not on file    Minutes per session: Not on file  . Stress: Not on file  Relationships  . Social  connections:    Talks on phone: Not on file    Gets together: Not on file    Attends religious service: Not on file    Active member of club or organization: Not on file    Attends meetings of clubs or organizations: Not on file    Relationship status: Not on file  . Intimate partner violence:    Fear of current or ex partner: Not on file    Emotionally abused: Not on file    Physically abused: Not on file    Forced sexual activity: Not on file  Other Topics Concern  . Not on file  Social History Narrative   5 hours of sleep    2 people living in the home   A dog living in the home   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   Eye exam nany clark    FA tobacco some etoh neg rd exercises      Plays drummer in a band              Allergies: Patient has no known allergies.  Medications: I have reviewed the patient's current medications.  Vital Signs: No data found.  Radiology: No results found.  Labs: Recent Labs    01/05/18 1440  WBC 6.6  RBC 5.09  HCT 49.2  PLT 191   Recent Labs    01/05/18 1440  NA 134*  K 4.5  CL 100*  CO2 24  BUN 8  CREATININE 0.95  GLUCOSE 219*  CALCIUM 9.4   No results for input(s): LABPT, INR in the last 72 hours.  Review of Systems: ROS  Physical Exam: There is no height or weight on file to calculate BMI.  Physical Exam  Constitutional: He is oriented to person, place, and time. He appears well-developed and well-nourished.  Eyes: Pupils are equal, round, and reactive to light.  Cardiovascular: Normal rate, regular rhythm and normal heart sounds.  Respiratory: Effort normal and breath sounds normal.  GI: Soft. Bowel sounds are normal.  Neurological: He is alert and oriented to person, place, and time.  Skin: Skin is warm and dry.  Psychiatric: He has a normal mood and affect. His behavior is normal. Judgment and thought content normal.   Continues to have issues with balance.  Pattern  has been affected.  Negative Babinski test, negative Hoffman test on the left positive on the right.  Brisk lower extremity reflexes bilaterally.  Coordination with hands is significantly hindered.  Grip strength is poor bilaterally.  Significant neck pain with palpation and range of motion.  No loss of bowel and bladder control.  Hip flexor strength also reduced bilaterally as is his quad strength.  EHL tibialis anterior gastrocnemius are 5 out of 5 bilaterally.  Cervical MRI: completed on 12/07/17 was reviewed with the patient.  I have also reviewed the radiology report.  Demonstrates cord signal changes with significant stenosis at C4-5.  No significant residual stenosis C5-6 and C6-7.  Severe left and moderate right C7-T1 subarticular stenosis but no high-grade central stenosis.    Assessment and Plan: Risks and benefits of surgery were discussed with the patient. These include: Infection, bleeding, death, stroke, paralysis, ongoing or worse pain, need for additional surgery, nonunion, leak of spinal fluid, adjacent segment degeneration requiring additional fusion surgery. Pseudoarthrosis (nonunion)requiring supplemental posterior fixation. Throat pain, swallowing difficulties, hoarseness or change in voice.   With respect to disc replacement: Additional risks include heterotopic ossification, inability to place the disc due to technical issues requiring bailout to a fusion procedure.  Ronette Deter, PAC for [name], MD [place] [phone]  Reviewed the surgical procedure with the patient all of his questions were encouraged and addressed.  We will plan on addressing the C4-5.  This may require removal of the C5-6 plate and exploration of the fusion to ensure that it is solid.  All appropriate risks benefits and alternatives were discussed with the patient.  All questions were addressed.  No significant change in his underlying clinical exam.

## 2018-01-12 ENCOUNTER — Ambulatory Visit (HOSPITAL_COMMUNITY): Payer: BLUE CROSS/BLUE SHIELD | Admitting: Certified Registered Nurse Anesthetist

## 2018-01-12 ENCOUNTER — Encounter (HOSPITAL_COMMUNITY): Payer: Self-pay | Admitting: Certified Registered Nurse Anesthetist

## 2018-01-12 ENCOUNTER — Ambulatory Visit (HOSPITAL_COMMUNITY): Payer: BLUE CROSS/BLUE SHIELD

## 2018-01-12 ENCOUNTER — Ambulatory Visit (HOSPITAL_COMMUNITY): Payer: BLUE CROSS/BLUE SHIELD | Admitting: Vascular Surgery

## 2018-01-12 ENCOUNTER — Encounter (HOSPITAL_COMMUNITY)
Admission: RE | Disposition: A | Discharge status: Home / Self Care | Payer: Self-pay | Source: Ambulatory Visit | Attending: Orthopedic Surgery

## 2018-01-12 ENCOUNTER — Observation Stay (HOSPITAL_COMMUNITY)
Admission: RE | Admit: 2018-01-12 | Discharge: 2018-01-13 | Disposition: A | Discharge status: Home / Self Care | Payer: BLUE CROSS/BLUE SHIELD | Source: Ambulatory Visit | Attending: Orthopedic Surgery | Admitting: Orthopedic Surgery

## 2018-01-12 DIAGNOSIS — I10 Essential (primary) hypertension: Secondary | ICD-10-CM | POA: Diagnosis not present

## 2018-01-12 DIAGNOSIS — Z7984 Long term (current) use of oral hypoglycemic drugs: Secondary | ICD-10-CM | POA: Diagnosis not present

## 2018-01-12 DIAGNOSIS — Z419 Encounter for procedure for purposes other than remedying health state, unspecified: Secondary | ICD-10-CM

## 2018-01-12 DIAGNOSIS — M4712 Other spondylosis with myelopathy, cervical region: Secondary | ICD-10-CM | POA: Diagnosis not present

## 2018-01-12 DIAGNOSIS — K219 Gastro-esophageal reflux disease without esophagitis: Secondary | ICD-10-CM | POA: Diagnosis not present

## 2018-01-12 DIAGNOSIS — E119 Type 2 diabetes mellitus without complications: Secondary | ICD-10-CM | POA: Insufficient documentation

## 2018-01-12 DIAGNOSIS — M50021 Cervical disc disorder at C4-C5 level with myelopathy: Principal | ICD-10-CM | POA: Insufficient documentation

## 2018-01-12 DIAGNOSIS — Z79899 Other long term (current) drug therapy: Secondary | ICD-10-CM | POA: Insufficient documentation

## 2018-01-12 DIAGNOSIS — Z981 Arthrodesis status: Secondary | ICD-10-CM

## 2018-01-12 DIAGNOSIS — F17210 Nicotine dependence, cigarettes, uncomplicated: Secondary | ICD-10-CM | POA: Insufficient documentation

## 2018-01-12 HISTORY — PX: ANTERIOR CERVICAL DECOMP/DISCECTOMY FUSION: SHX1161

## 2018-01-12 LAB — GLUCOSE, CAPILLARY
GLUCOSE-CAPILLARY: 272 mg/dL — AB (ref 65–99)
Glucose-Capillary: 228 mg/dL — ABNORMAL HIGH (ref 65–99)
Glucose-Capillary: 211 mg/dL — ABNORMAL HIGH (ref 65–99)
Glucose-Capillary: 162 mg/dL — ABNORMAL HIGH (ref 65–99)

## 2018-01-12 SURGERY — ANTERIOR CERVICAL DECOMPRESSION/DISCECTOMY FUSION 1 LEVEL/HARDWARE REMOVAL
Anesthesia: General | Site: Neck

## 2018-01-12 MED ORDER — LACTATED RINGERS IV SOLN: INTRAVENOUS | Status: DC

## 2018-01-12 MED ORDER — FENTANYL CITRATE (PF) 250 MCG/5ML IJ SOLN
INTRAMUSCULAR | Status: AC
  Filled 2018-01-12: qty 5

## 2018-01-12 MED ORDER — LACTATED RINGERS IV SOLN
INTRAVENOUS | Status: DC
  Administered 2018-01-12 (×2): via INTRAVENOUS

## 2018-01-12 MED ORDER — INSULIN ASPART 100 UNIT/ML ~~LOC~~ SOLN: 0.0000 [IU] | Freq: Three times a day (TID) | SUBCUTANEOUS | Status: DC

## 2018-01-12 MED ORDER — MIDAZOLAM HCL 5 MG/5ML IJ SOLN
INTRAMUSCULAR | Status: DC | PRN
  Administered 2018-01-12: 2 mg via INTRAVENOUS

## 2018-01-12 MED ORDER — PROPOFOL 10 MG/ML IV BOLUS
INTRAVENOUS | Status: AC
Start: 2018-01-12 — End: ?
  Filled 2018-01-12: qty 20

## 2018-01-12 MED ORDER — ACETAMINOPHEN 325 MG PO TABS
650.0000 mg | ORAL_TABLET | ORAL | Status: DC | PRN
  Administered 2018-01-12: 650 mg via ORAL
  Filled 2018-01-12: qty 2

## 2018-01-12 MED ORDER — METFORMIN HCL ER 500 MG PO TB24
1000.0000 mg | ORAL_TABLET | Freq: Two times a day (BID) | ORAL | Status: DC
  Administered 2018-01-13: 1000 mg via ORAL
  Filled 2018-01-12: qty 2

## 2018-01-12 MED ORDER — HYDROMORPHONE HCL 1 MG/ML IJ SOLN
0.2500 mg | INTRAMUSCULAR | Status: DC | PRN
  Administered 2018-01-12 (×2): 0.5 mg via INTRAVENOUS

## 2018-01-12 MED ORDER — ONDANSETRON 4 MG PO TBDP: 4.0000 mg | ORAL_TABLET | Freq: Three times a day (TID) | ORAL | 0 refills | Status: DC | PRN

## 2018-01-12 MED ORDER — DEXAMETHASONE SODIUM PHOSPHATE 10 MG/ML IJ SOLN
INTRAMUSCULAR | Status: AC
Start: 2018-01-12 — End: ?
  Filled 2018-01-12: qty 1

## 2018-01-12 MED ORDER — KETOROLAC TROMETHAMINE 30 MG/ML IJ SOLN
30.0000 mg | Freq: Once | INTRAMUSCULAR | Status: AC | PRN
  Administered 2018-01-12: 30 mg via INTRAVENOUS

## 2018-01-12 MED ORDER — ATORVASTATIN CALCIUM 20 MG PO TABS: 20.0000 mg | ORAL_TABLET | Freq: Every day | ORAL | Status: DC

## 2018-01-12 MED ORDER — CEFAZOLIN SODIUM-DEXTROSE 2-4 GM/100ML-% IV SOLN
INTRAVENOUS | Status: AC
  Filled 2018-01-12: qty 100

## 2018-01-12 MED ORDER — THROMBIN (RECOMBINANT) 20000 UNITS EX SOLR
CUTANEOUS | Status: AC
  Filled 2018-01-12: qty 20000

## 2018-01-12 MED ORDER — METHOCARBAMOL 500 MG PO TABS: 500.0000 mg | ORAL_TABLET | Freq: Three times a day (TID) | ORAL | 0 refills | Status: DC

## 2018-01-12 MED ORDER — SCOPOLAMINE 1 MG/3DAYS TD PT72
MEDICATED_PATCH | TRANSDERMAL | Status: AC
  Filled 2018-01-12: qty 1

## 2018-01-12 MED ORDER — DEXTROSE 5 % IV SOLN
INTRAVENOUS | Status: DC | PRN
  Administered 2018-01-12: 20 ug/min via INTRAVENOUS

## 2018-01-12 MED ORDER — PANTOPRAZOLE SODIUM 40 MG PO TBEC: 40.0000 mg | DELAYED_RELEASE_TABLET | Freq: Every day | ORAL | Status: DC

## 2018-01-12 MED ORDER — 0.9 % SODIUM CHLORIDE (POUR BTL) OPTIME
TOPICAL | Status: DC | PRN
  Administered 2018-01-12 (×2): 1000 mL

## 2018-01-12 MED ORDER — OXYCODONE-ACETAMINOPHEN 10-325 MG PO TABS: 1.0000 | ORAL_TABLET | ORAL | 0 refills | Status: AC | PRN

## 2018-01-12 MED ORDER — POLYETHYLENE GLYCOL 3350 17 G PO PACK
17.0000 g | PACK | Freq: Every day | ORAL | Status: DC | PRN
Start: 2018-01-12 — End: 2018-01-13

## 2018-01-12 MED ORDER — LIDOCAINE 2% (20 MG/ML) 5 ML SYRINGE
INTRAMUSCULAR | Status: DC | PRN
  Administered 2018-01-12: 100 mg via INTRAVENOUS

## 2018-01-12 MED ORDER — OXYCODONE HCL 5 MG PO TABS
10.0000 mg | ORAL_TABLET | ORAL | Status: DC | PRN
  Administered 2018-01-12 – 2018-01-13 (×5): 10 mg via ORAL
  Filled 2018-01-12 (×5): qty 2

## 2018-01-12 MED ORDER — METHOCARBAMOL 500 MG PO TABS
500.0000 mg | ORAL_TABLET | Freq: Four times a day (QID) | ORAL | Status: DC | PRN
  Administered 2018-01-12 – 2018-01-13 (×3): 500 mg via ORAL
  Filled 2018-01-12 (×3): qty 1

## 2018-01-12 MED ORDER — GLIMEPIRIDE 2 MG PO TABS
4.0000 mg | ORAL_TABLET | Freq: Every day | ORAL | Status: DC
  Filled 2018-01-12: qty 2

## 2018-01-12 MED ORDER — HYDROMORPHONE HCL 1 MG/ML IJ SOLN
INTRAMUSCULAR | Status: AC
  Filled 2018-01-12: qty 1

## 2018-01-12 MED ORDER — CANAGLIFLOZIN 100 MG PO TABS
100.0000 mg | ORAL_TABLET | Freq: Every day | ORAL | Status: DC
  Filled 2018-01-12: qty 1

## 2018-01-12 MED ORDER — MORPHINE SULFATE (PF) 4 MG/ML IV SOLN: 1.0000 mg | INTRAVENOUS | Status: DC | PRN

## 2018-01-12 MED ORDER — ONDANSETRON HCL 4 MG/2ML IJ SOLN: 4.0000 mg | Freq: Four times a day (QID) | INTRAMUSCULAR | Status: DC | PRN

## 2018-01-12 MED ORDER — ONDANSETRON HCL 4 MG/2ML IJ SOLN
INTRAMUSCULAR | Status: AC
  Filled 2018-01-12: qty 2

## 2018-01-12 MED ORDER — AMLODIPINE BESY-BENAZEPRIL HCL 10-40 MG PO CAPS: 1.0000 | ORAL_CAPSULE | Freq: Every day | ORAL | Status: DC

## 2018-01-12 MED ORDER — PROMETHAZINE HCL 25 MG/ML IJ SOLN: 6.2500 mg | INTRAMUSCULAR | Status: DC | PRN

## 2018-01-12 MED ORDER — SODIUM CHLORIDE 0.9% FLUSH
3.0000 mL | Freq: Two times a day (BID) | INTRAVENOUS | Status: DC
  Administered 2018-01-12 (×2): 3 mL via INTRAVENOUS

## 2018-01-12 MED ORDER — LIDOCAINE 2% (20 MG/ML) 5 ML SYRINGE
INTRAMUSCULAR | Status: AC
  Filled 2018-01-12: qty 5

## 2018-01-12 MED ORDER — MAGNESIUM CITRATE PO SOLN: 1.0000 | Freq: Once | ORAL | Status: DC | PRN

## 2018-01-12 MED ORDER — BENAZEPRIL HCL 40 MG PO TABS
40.0000 mg | ORAL_TABLET | Freq: Every day | ORAL | Status: DC
  Administered 2018-01-12 (×2): 40 mg via ORAL
  Filled 2018-01-12 (×3): qty 1

## 2018-01-12 MED ORDER — ROCURONIUM BROMIDE 10 MG/ML (PF) SYRINGE
PREFILLED_SYRINGE | INTRAVENOUS | Status: AC
  Filled 2018-01-12: qty 5

## 2018-01-12 MED ORDER — CEFAZOLIN SODIUM-DEXTROSE 2-4 GM/100ML-% IV SOLN
2.0000 g | Freq: Three times a day (TID) | INTRAVENOUS | Status: AC
  Administered 2018-01-12 – 2018-01-13 (×2): 2 g via INTRAVENOUS
  Filled 2018-01-12 (×2): qty 100

## 2018-01-12 MED ORDER — MENTHOL 3 MG MT LOZG: 1.0000 | LOZENGE | OROMUCOSAL | Status: DC | PRN

## 2018-01-12 MED ORDER — ACETAMINOPHEN 650 MG RE SUPP: 650.0000 mg | RECTAL | Status: DC | PRN

## 2018-01-12 MED ORDER — BUPIVACAINE-EPINEPHRINE (PF) 0.25% -1:200000 IJ SOLN
INTRAMUSCULAR | Status: AC
  Filled 2018-01-12: qty 30

## 2018-01-12 MED ORDER — DOCUSATE SODIUM 100 MG PO CAPS
100.0000 mg | ORAL_CAPSULE | Freq: Two times a day (BID) | ORAL | Status: DC
  Administered 2018-01-12: 100 mg via ORAL
  Filled 2018-01-12: qty 1

## 2018-01-12 MED ORDER — KETOROLAC TROMETHAMINE 30 MG/ML IJ SOLN
INTRAMUSCULAR | Status: AC
  Filled 2018-01-12: qty 1

## 2018-01-12 MED ORDER — INSULIN ASPART 100 UNIT/ML ~~LOC~~ SOLN
0.0000 [IU] | Freq: Every day | SUBCUTANEOUS | Status: DC
  Administered 2018-01-12: 3 [IU] via SUBCUTANEOUS

## 2018-01-12 MED ORDER — HEMOSTATIC AGENTS (NO CHARGE) OPTIME
TOPICAL | Status: DC | PRN
  Administered 2018-01-12: 1 via TOPICAL

## 2018-01-12 MED ORDER — MEPERIDINE HCL 50 MG/ML IJ SOLN: 6.2500 mg | INTRAMUSCULAR | Status: DC | PRN

## 2018-01-12 MED ORDER — AMLODIPINE BESYLATE 5 MG PO TABS
10.0000 mg | ORAL_TABLET | Freq: Every day | ORAL | Status: DC
  Administered 2018-01-12: 10 mg via ORAL
  Filled 2018-01-12: qty 2

## 2018-01-12 MED ORDER — ROCURONIUM BROMIDE 10 MG/ML (PF) SYRINGE
PREFILLED_SYRINGE | INTRAVENOUS | Status: DC | PRN
  Administered 2018-01-12: 50 mg via INTRAVENOUS
  Administered 2018-01-12: 20 mg via INTRAVENOUS

## 2018-01-12 MED ORDER — BUPIVACAINE-EPINEPHRINE 0.25% -1:200000 IJ SOLN
INTRAMUSCULAR | Status: DC | PRN
  Administered 2018-01-12: 10 mL

## 2018-01-12 MED ORDER — ONDANSETRON HCL 4 MG/2ML IJ SOLN
INTRAMUSCULAR | Status: DC | PRN
  Administered 2018-01-12: 4 mg via INTRAVENOUS

## 2018-01-12 MED ORDER — METHOCARBAMOL 1000 MG/10ML IJ SOLN
500.0000 mg | Freq: Four times a day (QID) | INTRAVENOUS | Status: DC | PRN
  Filled 2018-01-12: qty 5

## 2018-01-12 MED ORDER — THROMBIN (RECOMBINANT) 20000 UNITS EX SOLR
CUTANEOUS | Status: DC | PRN
  Administered 2018-01-12: 14:00:00 via TOPICAL

## 2018-01-12 MED ORDER — PROPOFOL 10 MG/ML IV BOLUS
INTRAVENOUS | Status: DC | PRN
  Administered 2018-01-12: 30 mg via INTRAVENOUS
  Administered 2018-01-12: 20 mg via INTRAVENOUS
  Administered 2018-01-12: 200 mg via INTRAVENOUS

## 2018-01-12 MED ORDER — PHENOL 1.4 % MT LIQD
1.0000 | OROMUCOSAL | Status: DC | PRN
  Filled 2018-01-12 (×2): qty 177

## 2018-01-12 MED ORDER — ONDANSETRON HCL 4 MG PO TABS: 4.0000 mg | ORAL_TABLET | Freq: Four times a day (QID) | ORAL | Status: DC | PRN

## 2018-01-12 MED ORDER — SODIUM CHLORIDE 0.9% FLUSH: 3.0000 mL | INTRAVENOUS | Status: DC | PRN

## 2018-01-12 MED ORDER — AMLODIPINE BESY-BENAZEPRIL HCL 5-20 MG PO CAPS: 1.0000 | ORAL_CAPSULE | Freq: Every day | ORAL | Status: DC

## 2018-01-12 MED ORDER — INSULIN ASPART 100 UNIT/ML ~~LOC~~ SOLN: 0.0000 [IU] | SUBCUTANEOUS | Status: DC

## 2018-01-12 MED ORDER — MIDAZOLAM HCL 2 MG/2ML IJ SOLN
INTRAMUSCULAR | Status: AC
  Filled 2018-01-12: qty 2

## 2018-01-12 MED ORDER — SCOPOLAMINE 1 MG/3DAYS TD PT72
MEDICATED_PATCH | TRANSDERMAL | Status: DC | PRN
  Administered 2018-01-12: 1 via TRANSDERMAL

## 2018-01-12 MED ORDER — OXYCODONE HCL 5 MG PO TABS
5.0000 mg | ORAL_TABLET | ORAL | Status: DC | PRN
Start: 2018-01-12 — End: 2018-01-13

## 2018-01-12 MED ORDER — SUGAMMADEX SODIUM 200 MG/2ML IV SOLN
INTRAVENOUS | Status: DC | PRN
  Administered 2018-01-12: 200 mg via INTRAVENOUS

## 2018-01-12 MED ORDER — FENTANYL CITRATE (PF) 250 MCG/5ML IJ SOLN
INTRAMUSCULAR | Status: DC | PRN
  Administered 2018-01-12 (×6): 50 ug via INTRAVENOUS
  Administered 2018-01-12: 100 ug via INTRAVENOUS
  Administered 2018-01-12 (×2): 50 ug via INTRAVENOUS

## 2018-01-12 MED ORDER — CEFAZOLIN SODIUM-DEXTROSE 2-3 GM-%(50ML) IV SOLR
INTRAVENOUS | Status: DC | PRN
  Administered 2018-01-12: 2 g via INTRAVENOUS

## 2018-01-12 SURGICAL SUPPLY — 70 items
BENZOIN TINCTURE PRP APPL 2/3 (GAUZE/BANDAGES/DRESSINGS) IMPLANT
BLADE CLIPPER SURG (BLADE) IMPLANT
BONE VIVIGEN FORMABLE 1.3CC (Bone Implant) ×3 IMPLANT
BUR EGG ELITE 4.0 (BURR) IMPLANT
BUR EGG ELITE 4.0MM (BURR)
BUR MATCHSTICK NEURO 3.0 LAGG (BURR) IMPLANT
CABLE BIPOLOR RESECTION CORD (MISCELLANEOUS) ×3 IMPLANT
CANISTER SUCT 3000ML PPV (MISCELLANEOUS) ×3 IMPLANT
CLOSURE STERI-STRIP 1/2X4 (GAUZE/BANDAGES/DRESSINGS) ×1
CLSR STERI-STRIP ANTIMIC 1/2X4 (GAUZE/BANDAGES/DRESSINGS) ×2 IMPLANT
CONT SPEC 4OZ CLIKSEAL STRL BL (MISCELLANEOUS) ×3 IMPLANT
COVER MAYO STAND STRL (DRAPES) ×9 IMPLANT
COVER SURGICAL LIGHT HANDLE (MISCELLANEOUS) ×6 IMPLANT
CRADLE DONUT ADULT HEAD (MISCELLANEOUS) ×3 IMPLANT
DEVICE FUSION NANLCK 8MM 6DEG (Cage) ×1 IMPLANT
DRAIN TLS ROUND 10FR (DRAIN) ×3 IMPLANT
DRAPE C-ARM 42X72 X-RAY (DRAPES) ×3 IMPLANT
DRAPE MICROSCOPE LEICA 46X105 (MISCELLANEOUS) IMPLANT
DRAPE POUCH INSTRU U-SHP 10X18 (DRAPES) ×3 IMPLANT
DRAPE SURG 17X23 STRL (DRAPES) ×3 IMPLANT
DRAPE U-SHAPE 47X51 STRL (DRAPES) ×6 IMPLANT
DRSG OPSITE POSTOP 3X4 (GAUZE/BANDAGES/DRESSINGS) ×3 IMPLANT
DURAPREP 26ML APPLICATOR (WOUND CARE) ×3 IMPLANT
ELECT COATED BLADE 2.86 ST (ELECTRODE) ×3 IMPLANT
ELECT PENCIL ROCKER SW 15FT (MISCELLANEOUS) ×3 IMPLANT
ELECT REM PT RETURN 9FT ADLT (ELECTROSURGICAL) ×3
ELECTRODE REM PT RTRN 9FT ADLT (ELECTROSURGICAL) ×1 IMPLANT
FLOSEAL 10ML (HEMOSTASIS) ×3 IMPLANT
FUSION TCS NANOLOCK 8MM 6DEG (Cage) ×3 IMPLANT
GLOVE BIO SURGEON STRL SZ 6.5 (GLOVE) ×2 IMPLANT
GLOVE BIO SURGEONS STRL SZ 6.5 (GLOVE) ×1
GLOVE BIOGEL PI IND STRL 6.5 (GLOVE) ×1 IMPLANT
GLOVE BIOGEL PI IND STRL 8.5 (GLOVE) ×1 IMPLANT
GLOVE BIOGEL PI INDICATOR 6.5 (GLOVE) ×2
GLOVE BIOGEL PI INDICATOR 8.5 (GLOVE) ×2
GLOVE SS BIOGEL STRL SZ 8.5 (GLOVE) ×2 IMPLANT
GLOVE SUPERSENSE BIOGEL SZ 8.5 (GLOVE) ×4
GOWN STRL REUS W/ TWL LRG LVL3 (GOWN DISPOSABLE) ×1 IMPLANT
GOWN STRL REUS W/TWL 2XL LVL3 (GOWN DISPOSABLE) ×6 IMPLANT
GOWN STRL REUS W/TWL LRG LVL3 (GOWN DISPOSABLE) ×2
KIT BASIN OR (CUSTOM PROCEDURE TRAY) ×3 IMPLANT
KIT TURNOVER KIT B (KITS) ×3 IMPLANT
NEEDLE HYPO 22GX1.5 SAFETY (NEEDLE) ×3 IMPLANT
NEEDLE SPNL 18GX3.5 QUINCKE PK (NEEDLE) ×3 IMPLANT
NS IRRIG 1000ML POUR BTL (IV SOLUTION) ×3 IMPLANT
PACK ORTHO CERVICAL (CUSTOM PROCEDURE TRAY) ×3 IMPLANT
PACK UNIVERSAL I (CUSTOM PROCEDURE TRAY) ×3 IMPLANT
PAD ARMBOARD 7.5X6 YLW CONV (MISCELLANEOUS) ×6 IMPLANT
PATTIES SURGICAL .25X.25 (GAUZE/BANDAGES/DRESSINGS) IMPLANT
PATTIES SURGICAL .5 X.5 (GAUZE/BANDAGES/DRESSINGS) IMPLANT
PIN DISTRACTION 14 (PIN) ×6 IMPLANT
RASP HELIOCORDIAL MED (MISCELLANEOUS) ×3 IMPLANT
RESTRAINT LIMB HOLDER UNIV (RESTRAINTS) ×3 IMPLANT
RUBBERBAND STERILE (MISCELLANEOUS) ×6 IMPLANT
SCREW ENDO BONE 3.8X14MM (Screw) ×6 IMPLANT
SPONGE INTESTINAL PEANUT (DISPOSABLE) ×6 IMPLANT
SPONGE LAP 4X18 X RAY DECT (DISPOSABLE) IMPLANT
SPONGE SURGIFOAM ABS GEL 100 (HEMOSTASIS) ×3 IMPLANT
SUT BONE WAX W31G (SUTURE) ×3 IMPLANT
SUT ETHILON 2 0 FS 18 (SUTURE) ×3 IMPLANT
SUT MON AB 3-0 SH 27 (SUTURE) ×2
SUT MON AB 3-0 SH27 (SUTURE) ×1 IMPLANT
SUT VIC AB 2-0 CT1 18 (SUTURE) ×3 IMPLANT
SYR CONTROL 10ML LL (SYRINGE) ×3 IMPLANT
TAPE CLOTH 4X10 WHT NS (GAUZE/BANDAGES/DRESSINGS) ×3 IMPLANT
TAPE UMBILICAL COTTON 1/8X30 (MISCELLANEOUS) ×3 IMPLANT
TOWEL GREEN STERILE (TOWEL DISPOSABLE) ×3 IMPLANT
TOWEL GREEN STERILE FF (TOWEL DISPOSABLE) ×3 IMPLANT
TRAY FOLEY W/METER SILVER 16FR (SET/KITS/TRAYS/PACK) IMPLANT
WATER STERILE IRR 1000ML POUR (IV SOLUTION) ×3 IMPLANT

## 2018-01-12 NOTE — Progress Notes (Signed)
Patient arrived to unit with dressing saturated with blood, and drainage tubing hanging on the collar dressing. Dressing changed with pressure dressing in place with ice pack and aspen collar reapplied. Will continue to monitor. Aisha RN

## 2018-01-12 NOTE — Transfer of Care (Signed)
Immediate Anesthesia Transfer of Care Note  Patient: Todd Rodriguez  Procedure(s) Performed: Revision ACDF C4-5, removal of hardware C5-6, exploration of fusion C5-6 (N/A Neck)  Patient Location: PACU  Anesthesia Type:General  Level of Consciousness: awake  Airway & Oxygen Therapy: Patient Spontanous Breathing and Patient connected to nasal cannula oxygen  Post-op Assessment: Report given to RN and Post -op Vital signs reviewed and stable  Post vital signs: Reviewed and stable  Last Vitals:  Vitals Value Taken Time  BP 153/96 01/12/2018  4:39 PM  Temp 36.8 C 01/12/2018  4:39 PM  Pulse 102 01/12/2018  4:45 PM  Resp 20 01/12/2018  4:45 PM  SpO2 91 % 01/12/2018  4:45 PM  Vitals shown include unvalidated device data.  Last Pain:  Vitals:   01/12/18 1041  TempSrc: Oral  PainSc:          Complications: No apparent anesthesia complications

## 2018-01-12 NOTE — Discharge Instructions (Signed)
Cervical Fusion, Care After °This sheet gives you information about how to care for yourself after your procedure. Your health care provider may also give you more specific instructions. If you have problems or questions, contact your health care provider. °What can I expect after the procedure? °After the procedure, it is common to have: °· Incision area pain. °· Numbness. °· Weakness. °· Sore throat. °· Difficulty swallowing. ° °Follow these instructions at home: °Medicines °· Take over-the-counter and prescription medicines, including pain medicines, only as told by your health care provider. °· If you were prescribed an antibiotic medicine, take it as told by your health care provider. Do not stop taking the antibiotic even if you start to feel better. °If you have a brace: °· Wear the brace as told by your health care provider. Remove it only as told by your health care provider. °· Keep the brace clean. °Incision care °· Follow instructions from your health care provider about how to take care of your incision. Make sure you: °? Wash your hands with soap and water before you change your bandage (dressing). If soap and water are not available, use hand sanitizer. °? Change your dressing as told by your health care provider. °? Leave stitches (sutures), skin glue, or adhesive strips in place. These skin closures may need to be in place for 2 weeks or longer. If adhesive strip edges start to loosen and curl up, you may trim the loose edges. Do not remove adhesive strips completely unless your health care provider tells you to do that. °· Keep your incision clean and dry. Do not take baths, swim, or use a hot tub until your health care provider approves. °· Check your incision area every day for signs of infection. Check for: °? More redness, swelling, or pain. °? More fluid or blood. °? Warmth. °? Pus or a bad smell. °Activity ° °· Rest and protect your back as much as possible. °· Do not lift anything that is  heavier than 10 lb (4.5 kg) or the limit that you are told by your health care provider. °· Do not twist or bend at the waist until your health care provider approves. °· Avoid: °? Pushing and pulling motions. °? Lifting anything over your head. °? Sitting or lying down in the same position for long periods of time. °· Do not exercise until your health care provider approves. Once your health care provider has approved exercise, ask him or her what kinds of exercises you can do to make your back stronger (physical therapy). °· Do not drive until your health care provider approves. °? Do not drive for 24 hours if you received a medicine to help you relax (sedative). °? Do not drive or use heavy machinery while taking prescription pain medicine. °General instructions ° °· Have someone assist you to turn in bed frequently by moving your whole body without twisting your back (log rolling technique). °· Wear compression stockings as told by your health care provider. These stockings help to prevent blood clots and reduce swelling in your legs. °· Do not use any products that contain nicotine or tobacco, such as cigarettes and e-cigarettes. These can delay bone healing. If you need help quitting, ask your health care provider. °· To prevent or treat constipation while you are taking prescription pain medicine, your health care provider may recommend that you: °? Drink enough fluid to keep your urine clear or pale yellow. °? Take over-the-counter or prescription medicines. °? Eat foods   that are high in fiber, such as fresh fruits and vegetables, whole grains, and beans. °? Limit foods that are high in fat and processed sugars, such as fried and sweet foods. °· Keep all follow-up visits as told by your health care provider. This is important. °Contact a health care provider if: °· You have pain that gets worse or does not get better with medicine. °· You have more redness, swelling, or pain around your incision. °· You have  more fluid or blood coming from your incision. °· Your incision feels warm to the touch. °· You have pus or a bad smell coming from your incision. °· You have a fever. °· You have weakness or numbness in your legs that is new or getting worse. °· You have swelling in your calf or leg. °· The edges of your incision break open. °· You vomit or feel nauseous. °· You have trouble controlling urination or bowel movements. °Get help right away if: °· You develop shortness of breath or chest pain. °· You have trouble swallowing. °· You have trouble breathing. °· You develop a cough. °This information is not intended to replace advice given to you by your health care provider. Make sure you discuss any questions you have with your health care provider. °Document Released: 05/05/2004 Document Revised: 04/15/2016 Document Reviewed: 04/01/2016 °Elsevier Interactive Patient Education © 2018 [employer] ° ° ° ° ° ° °

## 2018-01-12 NOTE — Op Note (Signed)
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  Previous C5-7 ACDF.  Postoperative diagnosis: Same  Operative procedure: 1.  Anterior cervical discectomy and fusion C4-5    2.  Removal of anterior cervical plate C5-6    3.  Exploration of previous fusion C5-6  First Assistant: Ronette Deter, PA  Implant system used: Titan 0 profile Nano lock intervertebral cage.  8 mm lordotic medium cage.  Secured with 214 mm 3.8 diameter screws.  Allograft:  vivogen  Indications: This is a very pleasant 42 year old gentleman who has had 2 previous anterior cervical fusions in the past.  Patient was in his usual state of health and started having increasing problems.  He ultimately presented to me where I diagnosed him with with cervical spondylitic myelopathy.  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  We discussed all appropriate risks benefits and alternatives to surgery and consent was obtained.  Operative report  Patient was brought the operating room placed upon the operating room table.  After successful induction of general anesthesia and endotracheal intubation teds SCDs were applied and the anterior cervical spine was prepped and draped in a standard fashion.  The arms were secured at the side.  Timeout was then taken to confirm patient procedure and all other important data.  Lateral fluoroscopy was then used to determine the level of the incision site.  Once identified the C4-5 level I marked out this incision and then infiltrated with quarter percent Marcaine with epinephrine solution.  Patient had previous left-sided approach is and so we elected to go on the contralateral side.  Preoperatively he was evaluated by her ear nose and throat specialist to confirm that there was no abnormal vocal cord motion and therefore no  contraindication to approaching the spine from the right side.  The transverse incision was made and sharp dissection was carried out down to the platysma.  I incised the platysma and then identified the medial border the sternocleidomastoid.  I then sharply began dissecting along this medial border into the deep cervical fascia.  I identified the omohyoid muscle continued dissecting along the medial border.  I then identified the esophagus trachea.  I swept the esophagus and trachea to the left and protected with an appendiceal retractor I then continued sharply dissecting down until I could see the anterior cervical spine.  I identified the carotid sheath and protected that laterally with my finger.  Once I was down through the prevertebral fascia to the anterior cervical spine I used Kitner dissectors to continue to mobilize the fascia sweating completely exposed from the to see 6 7 space.  At this point I removed the overhanging scar tissue exposed the cervical plate at the F1-6 level.  At this point I used a high-speed bur to remove the locking device overlying the fixation screws to the cervical plate.  Once those 2 locking plates were removed I was able to remove the screws.  The plate was then easily removed at this I then palpated the C5-6 disc space and there was no evidence of gross motion.  It seemed to be bridging bone throughout and there was no evidence of a pseudoarthrosis.  At this point I then turned my attention to the C4-5 level.  I mobilized the longus coli muscles out laterally and then placed my retracting blades  under the longus coli muscles endotracheal cuff was deflated and expanded the retractor to the appropriate with and then reinflated the endotracheal cuff.  An annulotomy was performed with a 15 blade scalpel and I used pituitary rongeurs to remove the bulk of the disc material.  I then placed a distraction pin into the C5 and see for vertebral bodies and gently distracted the  intervertebral space.  I then used my curettes to continue my colectomy towards the annulus.  I removed all the disc material and made sure to bleeding subchondral bone.  At this point I use my 1 mm Kerrison rongeur to develop a plane under the posterior annulus and resect this with my 1 mm Kerrison.  I also was able to take down the posterior osteophyte from the inferior aspect of the C4 vertebral body.  At this point with the annulus removed I was able to then dissect through the PLL and resect this as well to expose the underlying thecal sac.  At this point an adequate decompression.  I then went under the uncovertebral joints to ensure had adequate foraminal decompression.  There were hard disc osteophytes in the lateral recesses bilaterally consistent with what was seen preoperative MRI.  These were removed with a #1 Kerrison rongeur.  At this point I was able to pass my nerve hook behind the vertebral body of C4 and C5 without any hindrance.  At this point I felt as though I had an adequate decompression.  I then trialed spacers and elected to use the size 8 medium cage.  This provided the best distraction and restoration of lordosis and was secured in the space.  I obtain the 8 medium cage impacted with the allograft and then malleted to the appropriate depth.  Prior to doing this I did rasped the endplates to ensure had bleeding subchondral bone.  With the cage in place I then used the awl to breach the cortex and then placed 2 locking screws one superior one inferior.  Both screws had excellent purchase.  At this point I irrigated the wound copiously normal saline.  I removed the distraction pins and made sure hemostasis using bone wax and the bone holes and bipolar cautery and FloSeal.  With the irrigation complete I then took final x-rays which were satisfactory.  The hardware was in good position the sagittal alignment was restored and the canal was adequately decompressed.  At this point I did place  a deep drain and brought it out of a separate stab incision I then return the tracheoesophagus to midline after irrigation of the wound.  I closed the platysma with interrupted 2-0 Vicryl suture, and 3-0 Monocryl for the skin.  Steri-Strips and dry dressings were applied and the patient was ultimately extubated transferred the PACU without incident.  The end of the case all needle sponge counts were correct.

## 2018-01-12 NOTE — Anesthesia Procedure Notes (Signed)
Procedure Name: Intubation Date/Time: 01/12/2018 1:03 PM Performed by: Harden Mo, CRNA Pre-anesthesia Checklist: Patient identified, Emergency Drugs available, Suction available and Patient being monitored Patient Re-evaluated:Patient Re-evaluated prior to induction Oxygen Delivery Method: Circle System Utilized Preoxygenation: Pre-oxygenation with 100% oxygen Induction Type: IV induction Ventilation: Mask ventilation without difficulty and Oral airway inserted - appropriate to patient size Laryngoscope Size: Sabra Heck and 2 Grade View: Grade II Tube type: Oral Tube size: 7.5 mm Number of attempts: 1 Airway Equipment and Method: Stylet and Oral airway Placement Confirmation: ETT inserted through vocal cords under direct vision,  positive ETCO2 and breath sounds checked- equal and bilateral Secured at: 23 cm Tube secured with: Tape Dental Injury: Teeth and Oropharynx as per pre-operative assessment

## 2018-01-12 NOTE — Anesthesia Preprocedure Evaluation (Addendum)
Anesthesia Evaluation  Patient identified by MRN, date of birth, ID band Patient awake    Reviewed: Allergy & Precautions, NPO status , Patient's Chart, lab work & pertinent test results  Airway Mallampati: II  TM Distance: >3 FB Neck ROM: Limited    Dental no notable dental hx. (+) Teeth Intact, Dental Advisory Given   Pulmonary Current Smoker,    Pulmonary exam normal breath sounds clear to auscultation       Cardiovascular hypertension, Pt. on medications  Rhythm:Regular Rate:Normal     Neuro/Psych negative psych ROS   GI/Hepatic GERD  Medicated and Controlled,  Endo/Other  diabetes, Type 2, Oral Hypoglycemic Agents  Renal/GU   negative genitourinary   Musculoskeletal   Abdominal Normal abdominal exam  (+)   Peds  Hematology   Anesthesia Other Findings   Reproductive/Obstetrics                            Anesthesia Physical Anesthesia Plan  ASA: II  Anesthesia Plan: General   Post-op Pain Management:    Induction: Intravenous  PONV Risk Score and Plan: 2 and Ondansetron, Dexamethasone, Midazolam and Scopolamine patch - Pre-op  Airway Management Planned: Oral ETT  Additional Equipment:   Intra-op Plan:   Post-operative Plan: Extubation in OR  Informed Consent: I have reviewed the patients History and Physical, chart, labs and discussed the procedure including the risks, benefits and alternatives for the proposed anesthesia with the patient or authorized representative who has indicated his/her understanding and acceptance.   Dental advisory given  Plan Discussed with: CRNA and Surgeon  Anesthesia Plan Comments:         Anesthesia Quick Evaluation

## 2018-01-12 NOTE — Brief Op Note (Signed)
01/12/2018  4:37 PM  PATIENT:  Todd Rodriguez  42 y.o. male  PRE-OPERATIVE DIAGNOSIS:  Cervical myelopathy C4-5  POST-OPERATIVE DIAGNOSIS:  Cervical myelopathy C4-5  PROCEDURE:  Procedure(s) with comments: Revision ACDF C4-5, removal of hardware C5-6, exploration of fusion C5-6 (N/A) - 3.5 hrs  SURGEON:  Surgeon(s) and Role:    [name], MD - Primary  PHYSICIAN ASSISTANT:   ASSISTANTSRonette Deter. PA   ANESTHESIA:   general  EBL: minimal  BLOOD ADMINISTERED:none  DRAINS: (1) TLS Drain(s) to suction in the neck   LOCAL MEDICATIONS USED:  MARCAINE     SPECIMEN:  No Specimen  DISPOSITION OF SPECIMEN:  N/A  COUNTS:  YES  TOURNIQUET:  * No tourniquets in log *  DICTATION: .Dragon Dictation  PLAN OF CARE: Admit for overnight observation  PATIENT DISPOSITION:  PACU - hemodynamically stable.

## 2018-01-13 ENCOUNTER — Encounter (HOSPITAL_COMMUNITY): Payer: Self-pay | Admitting: Orthopedic Surgery

## 2018-01-13 ENCOUNTER — Other Ambulatory Visit: Payer: Self-pay

## 2018-01-13 DIAGNOSIS — M50021 Cervical disc disorder at C4-C5 level with myelopathy: Secondary | ICD-10-CM | POA: Diagnosis not present

## 2018-01-13 LAB — GLUCOSE, CAPILLARY: Glucose-Capillary: 186 mg/dL — ABNORMAL HIGH (ref 65–99)

## 2018-01-13 NOTE — Progress Notes (Signed)
    Subjective: Procedure(s) (LRB): Revision ACDF C4-5, removal of hardware C5-6, exploration of fusion C5-6 (N/A) 1 Day Post-Op  Patient reports pain as 2 on 0-10 scale.  Reports none arm pain reports incisional neck pain   Positive void Negative bowel movement Positive flatus Negative chest pain or shortness of breath  Objective: Vital signs in last 24 hours: Temp:  [97.9 F (36.6 C)-98.7 F (37.1 C)] 98.7 F (37.1 C) (04/11 0326) Pulse Rate:  [68-103] 72 (04/11 0326) Resp:  [11-26] 20 (04/11 0326) BP: (123-178)/(81-111) 130/81 (04/11 0326) SpO2:  [86 %-100 %] 96 % (04/11 0326) Weight:  [98.4 kg (217 lb)] 98.4 kg (217 lb) (04/10 1041)  Intake/Output from previous day: 04/10 0701 - 04/11 0700 In: 1900 [P.O.:250; I.V.:1550; IV Piggyback:100] Out: 125 [Blood:125]  Labs: No results for input(s): WBC, RBC, HCT, PLT in the last 72 hours. No results for input(s): NA, K, CL, CO2, BUN, CREATININE, GLUCOSE, CALCIUM in the last 72 hours. No results for input(s): LABPT, INR in the last 72 hours.  Physical Exam: Neurologically intact Intact pulses distally Incision: dressing C/D/I Compartment soft Body mass index is 27.86 kg/m.  Assessment/Plan: Patient stable  xrays n/a Mobilization with physical therapy Encourage incentive spirometry Continue care  Advance diet  Patient reports mild dysphasia. No significant swelling at the incision site, no difficulty breathing. Patient reports steady gait pattern when he ambulates and no significant radicular arm pain. At this point I am pleased with his overall recovery. Plan on discharge to home follow-up with me in 2 weeks.   [name], MD [facility] [phone]

## 2018-01-13 NOTE — Progress Notes (Signed)
Patient is discharged from room 3C04 at this time. Alert and in stable condition. IV site d/c'd, dressing to neck changed and instructions read to patient and spouse with understanding verbalized. Ambulate out of unit with all belongings at side.

## 2018-01-13 NOTE — Anesthesia Postprocedure Evaluation (Signed)
Anesthesia Post Note  Patient: Todd Rodriguez  Procedure(s) Performed: Revision ACDF C4-5, removal of hardware C5-6, exploration of fusion C5-6 (N/A Neck)     Patient location during evaluation: PACU Anesthesia Type: General Level of consciousness: awake and sedated Pain management: pain level controlled Vital Signs Assessment: post-procedure vital signs reviewed and stable Respiratory status: spontaneous breathing, nonlabored ventilation, respiratory function stable and patient connected to nasal cannula oxygen Cardiovascular status: blood pressure returned to baseline and stable Postop Assessment: no apparent nausea or vomiting Anesthetic complications: no    Last Vitals:  Vitals:   01/12/18 2311 01/13/18 0326  BP: 123/81 130/81  Pulse: 68 72  Resp: 20 20  Temp: 36.7 C 37.1 C  SpO2: 98% 96%    Last Pain:  Vitals:   01/13/18 0632  TempSrc:   PainSc: 6                  Chrishelle Zito,JAMES TERRILL

## 2018-01-13 NOTE — Evaluation (Signed)
Physical Therapy Evaluation and Discharge Patient Details Name: Todd Rodriguez MRN: [id number] DOB: Apr 29, 1976 Today's Date: 01/13/2018   History of Present Illness  Pt is a 42 y/o male s/p revision ACDF C4-5, removal of hardware C5-6, exploration of fusion C5-6.  Clinical Impression  Patient evaluated by Physical Therapy with no further acute PT needs identified. All education has been completed and the patient has no further questions. At the time of PT eval pt was able to perform transfers and ambulation with gross modified independence with occasional supervision for safety due to unsteadiness during gait training. Pt was educated on brace application/wearing schedule, car transfer, safe activity progression, and general safety with mobility at home. See below for any follow-up Physical Therapy or equipment needs. PT is signing off. Thank you for this referral.     Follow Up Recommendations No PT follow up;Supervision for mobility/OOB    Equipment Recommendations  None recommended by PT    Recommendations for Other Services       Precautions / Restrictions Precautions Precautions: Cervical Precaution Booklet Issued: Yes (comment) Precaution Comments: Reviewed verbally during functional mobility.  Required Braces or Orthoses: Cervical Brace Cervical Brace: Hard collar;At all times Restrictions Weight Bearing Restrictions: No      Mobility  Bed Mobility Overal bed mobility: Needs Assistance   Rolling: Min guard Sidelying to sit: Min guard       General bed mobility comments: Pt was received ambulating in hall with OT  Transfers Overall transfer level: Needs assistance Equipment used: None Transfers: Sit to/from Stand Sit to Stand: Supervision;Modified independent (Device/Increase time)         General transfer comment: supervision for safety, progressing to mod I by end of session. no physical assist needed; no unsteadiness noted.    Ambulation/Gait Ambulation/Gait assistance: Supervision;Modified independent (Device/Increase time) Ambulation Distance (Feet): 300 Feet Assistive device: None Gait Pattern/deviations: Step-through pattern;Decreased stride length;Staggering right Gait velocity: Decreased Gait velocity interpretation: Below normal speed for age/gender General Gait Details: 1 instance of mild lateral LOB. Pt was able to recover without assistance. Mod I overall with supervision for safety occasionally due to unsteadiness.   Stairs Stairs: Yes Stairs assistance: Supervision Stair Management: Alternating pattern;Forwards Number of Stairs: 10 General stair comments: Pt was able to complete 1 flight of stairs without difficulty. No unsteadiness or LOB noted. R handrail used for support.   Wheelchair Mobility    Modified Rankin (Stroke Patients Only)       Balance Overall balance assessment: Mild deficits observed, not formally tested                                           Pertinent Vitals/Pain Pain Assessment: Faces Faces Pain Scale: Hurts little more Pain Location: neck, throat  Pain Descriptors / Indicators: Guarding;Sore Pain Intervention(s): Monitored during session    Home Living Family/patient expects to be discharged to:: Private residence Living Arrangements: Spouse/significant other Available Help at Discharge: Family Type of Home: House Home Access: Stairs to enter   Technical brewer of Steps: 2 Home Layout: Able to live on main level with bedroom/bathroom Home Equipment: None      Prior Function Level of Independence: Independent               Hand Dominance   Dominant Hand: Right    Extremity/Trunk Assessment   Upper Extremity Assessment Upper Extremity Assessment: Defer to OT evaluation  RUE Deficits / Details: reports decreased sensation in 1st and 2nd digit    Lower Extremity Assessment Lower Extremity Assessment: Overall WFL  for tasks assessed    Cervical / Trunk Assessment Cervical / Trunk Assessment: Other exceptions Cervical / Trunk Exceptions: s/p cervical surgery   Communication   Communication: No difficulties  Cognition Arousal/Alertness: Awake/alert Behavior During Therapy: WFL for tasks assessed/performed Overall Cognitive Status: Within Functional Limits for tasks assessed                                        General Comments      Exercises     Assessment/Plan    PT Assessment Patent does not need any further PT services  PT Problem List         PT Treatment Interventions      PT Goals (Current goals can be found in the Care Plan section)  Acute Rehab PT Goals Patient Stated Goal: return home today  PT Goal Formulation: All assessment and education complete, DC therapy    Frequency     Barriers to discharge        Co-evaluation               AM-PAC PT "6 Clicks" Daily Activity  Outcome Measure Difficulty turning over in bed (including adjusting bedclothes, sheets and blankets)?: None Difficulty moving from lying on back to sitting on the side of the bed? : None Difficulty sitting down on and standing up from a chair with arms (e.g., wheelchair, bedside commode, etc,.)?: None Help needed moving to and from a bed to chair (including a wheelchair)?: None Help needed walking in hospital room?: None Help needed climbing 3-5 steps with a railing? : A Little 6 Click Score: 23    End of Session Equipment Utilized During Treatment: Gait belt Activity Tolerance: Patient tolerated treatment well Patient left: in chair;with call bell/phone within reach Nurse Communication: Mobility status PT Visit Diagnosis: Unsteadiness on feet (R26.81);Pain Pain - part of body: (neck)    Time: 7416-3845 PT Time Calculation (min) (ACUTE ONLY): 10 min   Charges:   PT Evaluation $PT Eval Low Complexity: 1 Low     PT G Codes:        Rolinda Roan, PT, DPT Acute  Rehabilitation Services Pager: [phone]   Thelma Comp 01/13/2018, 9:39 AM

## 2018-01-13 NOTE — Evaluation (Signed)
Occupational Therapy Evaluation Patient Details Name: Todd Rodriguez MRN: [id number] DOB: 1976-07-03 Today's Date: 01/13/2018    History of Present Illness Pt is a 42 y/o male s/p revision ACDF C4-5, removal of hardware C5-6, exploration of fusion C5-6.   Clinical Impression   This 42 y/o M presents with the above. At baseline pt is independent with ADLs and functional mobility. Pt completing functional mobility this session with minguard-supervision without AD; currently requires minA-minguard for LB ADLs secondary to adhering to cervical precautions. Pt will return home with spouse who is able to provide ADL assist PRN. Educated pt/pt spouse on cervical precautions, safety and compensatory techniques for completing ADLs while adhering to precautions with pt/pt spouse verbalizing understanding. Pt reports feeling comfortable completing ADLs while adhering to precautions and with spouse assist. No further acute OT needs identified at this time. Will sign off.     Follow Up Recommendations  No OT follow up;Supervision - Intermittent    Equipment Recommendations  None recommended by OT           Precautions / Restrictions Precautions Precautions: Cervical Precaution Booklet Issued: Yes (comment) Precaution Comments: issued and reviewed  Required Braces or Orthoses: Cervical Brace Cervical Brace: Hard collar Restrictions Weight Bearing Restrictions: No      Mobility Bed Mobility Overal bed mobility: Needs Assistance   Rolling: Min guard Sidelying to sit: Min guard       General bed mobility comments: pt with good recall of log roll technique from previous surgeries  Transfers Overall transfer level: Needs assistance Equipment used: None Transfers: Sit to/from Stand Sit to Stand: Supervision         General transfer comment: supervision for safety, no physical assist needed     Balance Overall balance assessment: Mild deficits observed, not formally tested                                          ADL either performed or assessed with clinical judgement   ADL Overall ADL's : Needs assistance/impaired     Grooming: Supervision/safety;Standing   Upper Body Bathing: Min guard;Standing   Lower Body Bathing: Min guard;Sit to/from stand   Upper Body Dressing : Set up;Sitting   Lower Body Dressing: Min guard;Minimal assistance;Sit to/from stand   Toilet Transfer: Min guard;Ambulation;Regular Museum/gallery exhibitions officer and Hygiene: Min guard;Sit to/from stand       Functional mobility during ADLs: Min guard;Supervision/safety General ADL Comments: educated pt/pt spouse on cervical precautions, safety and compensatory techniques for completing ADLs while adhering to precautions with pt/pt spouse verbalizing understanding                          Pertinent Vitals/Pain Pain Assessment: Faces Faces Pain Scale: Hurts little more Pain Location: neck, throat  Pain Descriptors / Indicators: Guarding;Sore Pain Intervention(s): Limited activity within patient's tolerance;Monitored during session     Hand Dominance Right   Extremity/Trunk Assessment Upper Extremity Assessment Upper Extremity Assessment: RUE deficits/detail;Overall WFL for tasks assessed RUE Deficits / Details: reports decreased sensation in 1st and 2nd digit   Lower Extremity Assessment Lower Extremity Assessment: Defer to PT evaluation   Cervical / Trunk Assessment Cervical / Trunk Assessment: Other exceptions Cervical / Trunk Exceptions: s/p cervical surgery    Communication Communication Communication: No difficulties   Cognition Arousal/Alertness: Awake/alert Behavior During Therapy: [facility] for tasks  assessed/performed Overall Cognitive Status: Within Functional Limits for tasks assessed                                                      Home Living Family/patient expects to be discharged to:: Private  residence Living Arrangements: Spouse/significant other Available Help at Discharge: Family Type of Home: House Home Access: Stairs to enter Technical brewer of Steps: 2   Home Layout: Able to live on main level with bedroom/bathroom     Bathroom Shower/Tub: Occupational psychologist: Standard     Home Equipment: None          Prior Functioning/Environment Level of Independence: Independent                 OT Problem List: Decreased knowledge of precautions;Decreased activity tolerance;Pain            OT Goals(Current goals can be found in the care plan section) Acute Rehab OT Goals Patient Stated Goal: return home today  OT Goal Formulation: All assessment and education complete, DC therapy                                 AM-PAC PT "6 Clicks" Daily Activity     Outcome Measure Help from another person eating meals?: None Help from another person taking care of personal grooming?: None Help from another person toileting, which includes using toliet, bedpan, or urinal?: None Help from another person bathing (including washing, rinsing, drying)?: A Little Help from another person to put on and taking off regular upper body clothing?: A Little Help from another person to put on and taking off regular lower body clothing?: A Little 6 Click Score: 21   End of Session Equipment Utilized During Treatment: Cervical collar Nurse Communication: Mobility status  Activity Tolerance: Patient tolerated treatment well Patient left: Other (comment)(handoff to PT )  OT Visit Diagnosis: Other abnormalities of gait and mobility (R26.89)                Time: 9093-1121 OT Time Calculation (min): 17 min Charges:  OT General Charges $OT Visit: 1 Visit OT Evaluation $OT Eval Low Complexity: 1 Low G-Codes:     Lou Cal, OT Pager [phone] 01/13/2018   Raymondo Band 01/13/2018, 9:22 AM

## 2018-01-18 ENCOUNTER — Encounter: Payer: BLUE CROSS/BLUE SHIELD | Admitting: Internal Medicine

## 2018-01-19 NOTE — Discharge Summary (Signed)
Physician Discharge Summary  Patient ID: Todd Rodriguez MRN: [id number] DOB/AGE: 1976-05-31 42 y.o.  Admit date: 01/12/2018 Discharge date: 01/13/18  Admission Diagnoses:  Cervical myelopathy  Discharge Diagnoses:  Active Problems:   S/P cervical spinal fusion   Past Medical History:  Diagnosis Date  . Arthritis   . Diabetes mellitus without complication ([place])   . GERD (gastroesophageal reflux disease)   . Hypertension   . Pancreatitis     Surgeries: Procedure(s): Revision ACDF C4-5, removal of hardware C5-6, exploration of fusion C5-6 on 01/12/2018   Consultants (if any):   Discharged Condition: Improved  Hospital Course: Taim Wurm is an 42 y.o. male who was admitted 01/12/2018 with a diagnosis of Cervical Myelopathy and went to the operating room on 01/12/2018 and underwent the above named procedures.  Post op day one pt reports decreased arm pain.  He reports a low level of pain controlled on oral medications.  Pt is urinating w/o difficulty.  Pt is ambulating in hallway. Pt is cleared by PT for DC.   He was given perioperative antibiotics:  Anti-infectives (From admission, onward)   Start     Dose/Rate Route Frequency Ordered Stop   01/12/18 2300  ceFAZolin (ANCEF) IVPB 2g/100 mL premix     2 g 200 mL/hr over 30 Minutes Intravenous Every 8 hours 01/12/18 1833 01/13/18 0633   01/12/18 1008  ceFAZolin (ANCEF) 2-4 GM/100ML-% IVPB    Note to Pharmacy:  Debbe Bales, Meredit: cabinet override      01/12/18 1008 01/12/18 2214    .  He was given sequential compression devices, early ambulation, and TED for DVT prophylaxis.  He benefited maximally from the hospital stay and there were no complications.    Recent vital signs:  Vitals:   01/13/18 0326 01/13/18 0738  BP: 130/81 (!) 147/92  Pulse: 72 68  Resp: 20 18  Temp: 98.7 F (37.1 C) 99 F (37.2 C)  SpO2: 96% 96%    Recent laboratory studies:  Lab Results  Component Value Date   HGB 16.9 01/05/2018   HGB  16.6 01/27/2017   HGB 15.0 01/07/2016   Lab Results  Component Value Date   WBC 6.6 01/05/2018   PLT 191 01/05/2018   No results found for: INR Lab Results  Component Value Date   NA 134 (L) 01/05/2018   K 4.5 01/05/2018   CL 100 (L) 01/05/2018   CO2 24 01/05/2018   BUN 8 01/05/2018   CREATININE 0.95 01/05/2018   GLUCOSE 219 (H) 01/05/2018    Discharge Medications:   Allergies as of 01/13/2018   No Known Allergies     Medication List    STOP taking these medications   FLAX SEEDS PO   ibuprofen 200 MG tablet Commonly known as:  ADVIL,MOTRIN   OVER THE COUNTER MEDICATION     TAKE these medications   amLODipine-benazepril 10-40 MG capsule Commonly known as:  LOTREL Take 1 capsule by mouth daily.   amLODipine-benazepril 5-20 MG capsule Commonly known as:  LOTREL TAKE 1 CAPSULE BY MOUTH DAILY.   atorvastatin 20 MG tablet Commonly known as:  LIPITOR TAKE 1 TABLET (20 MG TOTAL) BY MOUTH DAILY.   empagliflozin 10 MG Tabs tablet Commonly known as:  JARDIANCE Take 10 mg by mouth daily.   glimepiride 4 MG tablet Commonly known as:  AMARYL Take 1.5 tablets (6 mg total) by mouth daily before breakfast. What changed:  how much to take   glucose blood test strip Commonly known as:  ONE TOUCH TEST STRIPS Use as instructed   metFORMIN 500 MG 24 hr tablet Commonly known as:  GLUCOPHAGE-XR TAKE 2 TABLETS (1,000 MG TOTAL) BY MOUTH 2 (TWO) TIMES DAILY.   methocarbamol 500 MG tablet Commonly known as:  ROBAXIN Take 1 tablet (500 mg total) by mouth 3 (three) times daily.   ondansetron 4 MG disintegrating tablet Commonly known as:  ZOFRAN ODT Take 1 tablet (4 mg total) by mouth every 8 (eight) hours as needed for nausea or vomiting.   pantoprazole 40 MG tablet Commonly known as:  PROTONIX TAKE 1 TABLET BY MOUTH TWICE A DAY What changed:    how much to take  how to take this  when to take this     ASK your doctor about these medications    oxyCODONE-acetaminophen 10-325 MG tablet Commonly known as:  PERCOCET Take 1 tablet by mouth every 4 (four) hours as needed for up to 5 days for pain. Ask about: Should I take this medication?       Diagnostic Studies: Dg Cervical Spine 2-3 Views  Result Date: 01/12/2018 CLINICAL DATA:  Revision of C4-5 ACDF, Removal of Hardware C5-6, and exploration of fusion C5-6; fluoro time 36 seconds EXAM: CERVICAL SPINE - 2-3 VIEW; DG C-ARM 61-120 MIN COMPARISON:  None. FINDINGS: AP and lateral intraoperative fluoroscopic images are provided. Hardware is position within the disc at the C4-5 level, with associated fixation screws appearing appropriately positioned within the C4 and C5 vertebral bodies. Similar hardware is present at the cervicothoracic junction. Fluoroscopy was provided for 36 seconds. IMPRESSION: Fixation hardware within the mid and lower cervical spine. No evidence of surgical complicating feature. Electronically Signed   By: Franki Cabot M.D.   On: 01/12/2018 16:38   Dg C-arm 1-60 Min  Result Date: 01/12/2018 CLINICAL DATA:  Revision of C4-5 ACDF, Removal of Hardware C5-6, and exploration of fusion C5-6; fluoro time 36 seconds EXAM: CERVICAL SPINE - 2-3 VIEW; DG C-ARM 61-120 MIN COMPARISON:  None. FINDINGS: AP and lateral intraoperative fluoroscopic images are provided. Hardware is position within the disc at the C4-5 level, with associated fixation screws appearing appropriately positioned within the C4 and C5 vertebral bodies. Similar hardware is present at the cervicothoracic junction. Fluoroscopy was provided for 36 seconds. IMPRESSION: Fixation hardware within the mid and lower cervical spine. No evidence of surgical complicating feature. Electronically Signed   By: Franki Cabot M.D.   On: 01/12/2018 16:38   Dg C-arm 1-60 Min  Result Date: 01/12/2018 CLINICAL DATA:  Revision of C4-5 ACDF, Removal of Hardware C5-6, and exploration of fusion C5-6; fluoro time 36 seconds EXAM:  CERVICAL SPINE - 2-3 VIEW; DG C-ARM 61-120 MIN COMPARISON:  None. FINDINGS: AP and lateral intraoperative fluoroscopic images are provided. Hardware is position within the disc at the C4-5 level, with associated fixation screws appearing appropriately positioned within the C4 and C5 vertebral bodies. Similar hardware is present at the cervicothoracic junction. Fluoroscopy was provided for 36 seconds. IMPRESSION: Fixation hardware within the mid and lower cervical spine. No evidence of surgical complicating feature. Electronically Signed   By: Franki Cabot M.D.   On: 01/12/2018 16:38    Disposition:  Pt will present to clinic 2 weeks post op Post op medication provided Discharge Instructions    Incentive spirometry RT   Complete by:  As directed       Follow-up Information    [name], MD Follow up in 2 week(s).   Specialty:  Orthopedic Surgery Contact information: [id number] [place] [address] [phone]            Signed: Valinda Hoar 01/19/2018, 3:43 PM

## 2018-01-20 ENCOUNTER — Encounter (HOSPITAL_COMMUNITY): Payer: Self-pay | Admitting: Orthopedic Surgery

## 2018-01-24 NOTE — Op Note (Signed)
This is an addendum to the operative report dated 01/12/18.  Should be noted that after removing the cervical plate I did not palpate the C5-6 intervertebral space.  I noted there to be solid bone spanning from C5-C6.  There was no evidence of pseudoarthrosis.

## 2018-01-27 ENCOUNTER — Encounter (HOSPITAL_COMMUNITY): Payer: Self-pay | Admitting: Orthopedic Surgery

## 2018-01-30 NOTE — Addendum Note (Signed)
Addendum  created 01/30/18 2302 by Lyn Hollingshead, MD   Intraprocedure Staff edited

## 2018-02-04 ENCOUNTER — Other Ambulatory Visit: Payer: Self-pay | Admitting: Internal Medicine

## 2018-02-04 NOTE — Telephone Encounter (Signed)
He is post op form neck surgery . Will refill  But  Plan  ROv before refills run out  Make appt for July or thereabouts

## 2018-02-04 NOTE — Telephone Encounter (Signed)
Most recent labs showed elevated sugar readings 01/03/18  A1C 7.3 01/05/18 Glucose 2019  Are you wanting to refill the Glimepiride 43m - Take 670mdaily Any changes?  Please advise Dr PaRegis Billthanks.

## 2018-03-28 ENCOUNTER — Other Ambulatory Visit: Payer: Self-pay | Admitting: Internal Medicine

## 2018-03-30 NOTE — Telephone Encounter (Signed)
Stopping tobacco  Is best for your   Artery health  And ed.   Can try doubling up on the lotrel   5 20       Then can   Try 10 /40 to be sent in .  I don think this med is causing ed  Sx .  Bp control important. Can check readings at home.  See if you can move up your  Arm numb neuro  evaluation.  Will refill the cilias.    At this time.   ROV in 3 months    Dr. Mamie Nick, when would  you like to see the pt back for annual/follow up?

## 2018-04-29 ENCOUNTER — Ambulatory Visit: Payer: BLUE CROSS/BLUE SHIELD | Admitting: Internal Medicine

## 2018-05-01 ENCOUNTER — Other Ambulatory Visit: Payer: Self-pay | Admitting: Internal Medicine

## 2018-05-24 ENCOUNTER — Ambulatory Visit: Payer: BLUE CROSS/BLUE SHIELD | Admitting: Internal Medicine

## 2018-05-24 ENCOUNTER — Encounter: Payer: Self-pay | Admitting: Internal Medicine

## 2018-05-24 VITALS — BP 132/80 | HR 101 | Temp 98.3°F | Wt 205.7 lb

## 2018-05-24 DIAGNOSIS — F419 Anxiety disorder, unspecified: Secondary | ICD-10-CM | POA: Diagnosis not present

## 2018-05-24 DIAGNOSIS — F410 Panic disorder [episodic paroxysmal anxiety] without agoraphobia: Secondary | ICD-10-CM | POA: Diagnosis not present

## 2018-05-24 DIAGNOSIS — E785 Hyperlipidemia, unspecified: Secondary | ICD-10-CM

## 2018-05-24 DIAGNOSIS — Z981 Arthrodesis status: Secondary | ICD-10-CM | POA: Diagnosis not present

## 2018-05-24 DIAGNOSIS — R000 Tachycardia, unspecified: Secondary | ICD-10-CM

## 2018-05-24 DIAGNOSIS — Z79899 Other long term (current) drug therapy: Secondary | ICD-10-CM

## 2018-05-24 DIAGNOSIS — I10 Essential (primary) hypertension: Secondary | ICD-10-CM

## 2018-05-24 MED ORDER — CITALOPRAM HYDROBROMIDE 20 MG PO TABS: ORAL_TABLET | ORAL | 1 refills | Status: DC

## 2018-05-24 MED ORDER — LORAZEPAM 1 MG PO TABS: 1.0000 mg | ORAL_TABLET | Freq: Three times a day (TID) | ORAL | 0 refills | Status: DC | PRN

## 2018-05-24 NOTE — Progress Notes (Signed)
Chief Complaint  Patient presents with  . Anxiety    Pt states that he is having a hard time sleeping and his heart is racing. pt is under more stress right now.     HPI: Todd Rodriguez 42 y.o. come in for anxiety SDA . He is status post C-spine surgery.  He still has continued pain decreased range of motion laterally and not sleeping well at night.  He is to go through another 6 weeks of physical therapy and reassess.  In 3 months in this time he is out of work. 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  His father passed away suddenly in March 25, 2023 .   He is pretty much physically and able to fix problems that need to be fixed as his father was a Gaffer.  This is taken him to court with directives to fix the situation.  He is anxious and stressed no alcohol or D 1 pack/day.  Finds himself at times with a racing heart when he thinks of the stress.  And it sometimes in the middle the night.  There is no coughing increasing shortness of breath or otherwise chest pain.  He is identifies these as panic attacks.  He has not had these before.  He does not think he has had low blood sugars has not really been checking blood pressures been okay. ROS: See pertinent positives and negatives per HPI. No syncope new sob   Past Medical History:  Diagnosis Date  . Arthritis   . Diabetes mellitus without complication ([place])   . GERD (gastroesophageal reflux disease)   . Hypertension   . Pancreatitis     Family History  Problem Relation Age of Onset  . Diabetes Mother   . Hypertension Mother   . Diabetes Father   . Hypertension Father   . Arthritis Maternal Grandmother   . Hypertension Maternal Grandfather   . Diabetes Maternal Grandfather   . Colon cancer Neg Hx   . Pancreatitis Neg Hx   . Pancreatic cancer Neg Hx     Social History   Socioeconomic History  . Marital status: Married    Spouse name: Not on file  . Number of children: 1  . Years of  education: Not on file  . Highest education level: Not on file  Occupational History    Employer: [employer]  . Financial resource strain: Not on file  . Food insecurity:    Worry: Not on file    Inability: Not on file  . Transportation needs:    Medical: Not on file    Non-medical: Not on file  Tobacco Use  . Smoking status: Current Every Day Smoker    Packs/day: 1.00    Types: Cigarettes  . Smokeless tobacco: Never Used  Substance and Sexual Activity  . Alcohol use: Yes    Comment: every weekend  . Drug use: No  . Sexual activity: Yes    Partners: Female    Comment: Patient's Wife  Lifestyle  . Physical activity:    Days per week: Not on file    Minutes per session: Not on file  . Stress: Not on file  Relationships  . Social connections:    Talks on phone: Not on file    Gets together: Not on file    Attends religious service: Not on file    Active member of club or organization: Not on file    Attends meetings of  clubs or organizations: Not on file    Relationship status: Not on file  Other Topics Concern  . 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 amLODipine-benazepril (LOTREL) 10-40 MG capsule Take 1 capsule by mouth daily.    Marland Kitchen atorvastatin (LIPITOR) 20 MG tablet TAKE 1 TABLET (20 MG TOTAL) BY MOUTH DAILY. 90 tablet 0  . empagliflozin (JARDIANCE) 10 MG TABS tablet Take 10 mg by mouth daily. 30 tablet 3  . glimepiride (AMARYL) 4 MG tablet TAKE 1.5 TABLETS (6 MG TOTAL) BY MOUTH DAILY BEFORE BREAKFAST. 45 tablet 2  . glucose blood (ONE TOUCH TEST STRIPS) test strip Use as instructed 100 each 11  . metFORMIN  (GLUCOPHAGE-XR) 500 MG 24 hr tablet TAKE 2 TABLETS (1,000 MG TOTAL) BY MOUTH 2 (TWO) TIMES DAILY. 360 tablet 0  . methocarbamol (ROBAXIN) 500 MG tablet Take 1 tablet (500 mg total) by mouth 3 (three) times daily. 30 tablet 0  . ondansetron (ZOFRAN ODT) 4 MG disintegrating tablet Take 1 tablet (4 mg total) by mouth every 8 (eight) hours as needed for nausea or vomiting. 20 tablet 0  . pantoprazole (PROTONIX) 40 MG tablet TAKE 1 TABLET BY MOUTH TWICE A DAY (Patient taking differently: Take 40 mg by mouth once daily) 180 tablet 1  . amLODipine-benazepril (LOTREL) 10-40 MG capsule TAKE 1 CAPSULE BY MOUTH DAILY. **NEEDS OFFICE VISIT FOR FURTHER REFILLS** (Patient not taking: Reported on 05/24/2018) 30 capsule 0   No facility-administered medications prior to visit.      EXAM:  BP 132/80 (BP Location: Right Arm, Patient Position: Sitting, Cuff Size: Normal)   Pulse (!) 101   Temp 98.3 F (36.8 C) (Oral)   Wt 205 lb 11.2 oz (93.3 kg)   BMI 26.41 kg/m   Body mass index is 26.41 kg/m.  GENERAL: vitals reviewed and listed above, alert, oriented, appears well hydrated and in no acute distress NECK: no obvious masses on inspection palpation   Pos t surggical  LUNGS: clear to auscultation bilaterally, no wheezes, rales or rhonchi, good air movement CV: HRRR, no clubbing cyanosis or  peripheral edema nl cap refill  MS: moves all extremities without noticeable focal  abnormality PSYCH: pleasant and cooperative, looks stressed but cognitively intact  Nl speech  Lab Results  Component Value Date   WBC 6.6 01/05/2018   HGB 16.9 01/05/2018   HCT 49.2 01/05/2018   PLT 191 01/05/2018   GLUCOSE 219 (H) 01/05/2018   CHOL 191 01/07/2016   TRIG 215.0 (H) 01/07/2016   HDL 40.80 01/07/2016   LDLDIRECT 115.0 01/07/2016   LDLCALC 50 04/12/2014   ALT 35 01/27/2017   AST 27 01/27/2017   NA 134 (L) 01/05/2018   K 4.5 01/05/2018   CL 100 (L) 01/05/2018   CREATININE 0.95 01/05/2018   BUN 8 01/05/2018    CO2 24 01/05/2018   TSH 1.39 01/07/2016   PSA 0.76 01/07/2016   HGBA1C 7.3 01/03/2018   MICROALBUR 1.5 12/17/2016   BP Readings from  Last 3 Encounters:  05/24/18 132/80  01/13/18 (!) 147/92  01/05/18 (!) 152/88   Wt Readings from Last 3 Encounters:  05/24/18 205 lb 11.2 oz (93.3 kg)  01/13/18 217 lb (98.4 kg)  01/05/18 217 lb 6 oz (98.6 kg)     ASSESSMENT AND PLAN:  Discussed the following assessment and plan:  Panic attacks - Plan: Hemoglobin A1c, Lipid panel, Basic metabolic panel, TSH, T4, free  Medication management - Plan: Hemoglobin A1c, Lipid panel, Basic metabolic panel, TSH, T4, free, Hepatic function panel  Anxiety  S/P cervical spinal fusion - Plan: Hemoglobin A1c, Lipid panel, Basic metabolic panel, TSH, T4, free  Racing heart beat - Plan: Hemoglobin A1c, Lipid panel, Basic metabolic panel, TSH, T4, free  Hyperlipidemia, unspecified hyperlipidemia type - Plan: Hemoglobin A1c, Lipid panel, Basic metabolic panel, TSH, T4, free, Hepatic function panel  Essential hypertension, benign - Plan: Hemoglobin A1c, Lipid panel, Basic metabolic panel, TSH, T4, free Situation consistent with anxiety and panic.  Is due for metabolic monitoring will come back later this week fasting since he is out of work anyway and has to come in to town for physical therapy.  Cardiac exam is normal today. In the meantime add controller medication and as needed Ativan with cautions discussed risk benefit.  He is not on opiate pain medicines. He states he is in counseling with his pastor and is been quite helpful. Plan our OV in 3 to 4 weeks to do a med check and decide if other steps appropriate. He is enrolled in my chart can communicate with [place] in the meantime if needed. -Patient advised to return or notify health care team  if  new concerns arise.  Patient Instructions  Please get  Lab fasting  Within the week.   Begin  Controller medication and  Rescue  Medication  For panic as needed .   Plan ROV in 3-4 weeks or as needed.     Living With Anxiety After being diagnosed with an anxiety disorder, you may be relieved to know why you have felt or behaved a certain way. It is natural to also feel overwhelmed about the treatment ahead and what it will mean for your life. With care and support, you can manage this condition and recover from it. How to cope with anxiety Dealing with stress Stress is your body's reaction to life changes and events, both good and bad. Stress can last just a few hours or it can be ongoing. Stress can play a major role in anxiety, so it is important to learn both how to cope with stress and how to think about it differently. Talk with your health care provider or a counselor to learn more about stress reduction. He or she may suggest some stress reduction techniques, such as:  Music therapy. This can include creating or listening to music that you enjoy and that inspires you.  Mindfulness-based meditation. This involves being aware of your normal breaths, rather than trying to control your breathing. It can be done while sitting or walking.  Centering prayer. This is a kind of meditation that involves focusing on a word, phrase, or sacred image that is meaningful to you and that brings you peace.  Deep breathing. To do this, expand your stomach and inhale slowly through your nose. Hold your breath for 3-5 seconds. Then exhale slowly, allowing your stomach muscles to relax.  Self-talk. This is a skill where you identify thought patterns that lead to anxiety reactions and correct those  thoughts.  Muscle relaxation. This involves tensing muscles then relaxing them.  Choose a stress reduction technique that fits your lifestyle and personality. Stress reduction techniques take time and practice. Set aside 5-15 minutes a day to do them. Therapists can offer training in these techniques. The training may be covered by some insurance plans. Other things you can  do to manage stress include:  Keeping a stress diary. This can help you learn what triggers your stress and ways to control your response.  Thinking about how you respond to certain situations. You may not be able to control everything, but you can control your reaction.  Making time for activities that help you relax, and not feeling guilty about spending your time in this way.  Therapy combined with coping and stress-reduction skills provides the best chance for successful treatment. Medicines Medicines can help ease symptoms. Medicines for anxiety include:  Anti-anxiety drugs.  Antidepressants.  Beta-blockers.  Medicines may be used as the main treatment for anxiety disorder, along with therapy, or if other treatments are not working. Medicines should be prescribed by a health care provider. Relationships Relationships can play a big part in helping you recover. Try to spend more time connecting with trusted friends and family members. Consider going to couples counseling, taking family education classes, or going to family therapy. Therapy can help you and others better understand the condition. How to recognize changes in your condition Everyone has a different response to treatment for anxiety. Recovery from anxiety happens when symptoms decrease and stop interfering with your daily activities at home or work. This may mean that you will start to:  Have better concentration and focus.  Sleep better.  Be less irritable.  Have more energy.  Have improved memory.  It is important to recognize when your condition is getting worse. Contact your health care provider if your symptoms interfere with home or work and you do not feel like your condition is improving. Where to find help and support: You can get help and support from these sources:  Self-help groups.  Online and [employer].  A trusted spiritual leader.  Couples counseling.  Family education  classes.  Family therapy.  Follow these instructions at home:  Eat a healthy diet that includes plenty of vegetables, fruits, whole grains, low-fat dairy products, and lean protein. Do not eat a lot of foods that are high in solid fats, added sugars, or salt.  Exercise. Most adults should do the following: ? Exercise for at least 150 minutes each week. The exercise should increase your heart rate and make you sweat (moderate-intensity exercise). ? Strengthening exercises at least twice a week.  Cut down on caffeine, tobacco, alcohol, and other potentially harmful substances.  Get the right amount and quality of sleep. Most adults need 7-9 hours of sleep each night.  Make choices that simplify your life.  Take over-the-counter and prescription medicines only as told by your health care provider.  Avoid caffeine, alcohol, and certain over-the-counter cold medicines. These may make you feel worse. Ask your pharmacist which medicines to avoid.  Keep all follow-up visits as told by your health care provider. This is important. Questions to ask your health care provider  Would I benefit from therapy?  How often should I follow up with a health care provider?  How long do I need to take medicine?  Are there any long-term side effects of my medicine?  Are there any alternatives to taking medicine? Contact a health care provider  if:  You have a hard time staying focused or finishing daily tasks.  You spend many hours a day feeling worried about everyday life.  You become exhausted by worry.  You start to have headaches, feel tense, or have nausea.  You urinate more than normal.  You have diarrhea. Get help right away if:  You have a racing heart and shortness of breath.  You have thoughts of hurting yourself or others. If you ever feel like you may hurt yourself or others, or have thoughts about taking your own life, get help right away. You can go to your nearest emergency  department or call:  Your local emergency services (911 in the [place]  A suicide crisis helpline, such as the [facility] at 740-740-4381. This is open 24-hours a day.  Summary  Taking steps to deal with stress can help calm you.  Medicines cannot cure anxiety disorders, but they can help ease symptoms.  Family, friends, and partners can play a big part in helping you recover from an anxiety disorder. This information is not intended to replace advice given to you by your health care provider. Make sure you discuss any questions you have with your health care provider. Document Released: 09/15/2016 Document Revised: 09/15/2016 Document Reviewed: 09/15/2016 Elsevier Interactive Patient Education  2018 [employer].    Panic Attack A panic attack is a sudden episode of severe anxiety, fear, or discomfort that causes physical and emotional symptoms. The attack may be in response to something frightening, or it may occur for no known reason. Symptoms of a panic attack can be similar to symptoms of a heart attack or stroke. It is important to see your health care provider when you have a panic attack so that these conditions can be ruled out. A panic attack is a symptom of another condition. Most panic attacks go away with treatment of the underlying problem. If you have panic attacks often, you may have a condition called panic disorder. What are the causes? A panic attack may be caused by:  An extreme, life-threatening situation, such as a war or natural disaster.  An anxiety disorder, such as post-traumatic stress disorder.  Depression.  Certain medical conditions, including heart problems, neurological conditions, and infections.  Certain over-the-counter and prescription medicines.  Illegal drugs that increase heart rate and blood pressure, such as methamphetamine.  Alcohol.  Supplements that increase anxiety.  Panic disorder.  What increases  the risk? You are more likely to develop this condition if:  You have an anxiety disorder.  You have another mental health condition.  You take certain medicines.  You use alcohol, illegal drugs, or other substances.  You are under extreme stress.  A life event is causing increased feelings of anxiety and depression.  What are the signs or symptoms? A panic attack starts suddenly, usually lasts about 20 minutes, and occurs with one or more of the following:  A pounding heart.  A feeling that your heart is beating irregularly or faster than normal (palpitations).  Sweating.  Trembling or shaking.  Shortness of breath or feeling smothered.  Feeling choked.  Chest pain or discomfort.  Nausea or a strange feeling in your stomach.  Dizziness, feeling lightheaded, or feeling like you might faint.  Chills or hot flashes.  Numbness or tingling in your lips, hands, or feet.  Feeling confused, or feeling that you are not yourself.  Fear of losing control or being emotionally unstable.  Fear of dying.  How  is this diagnosed? A panic attack is diagnosed with an assessment by your health care provider. During the assessment your health care provider will ask questions about:  Your history of anxiety, depression, and panic attacks.  Your medical history.  Whether you drink alcohol, use illegal drugs, take supplements, or take medicines. Be honest about your substance use.  Your health care provider may also:  Order blood tests or other kinds of tests to rule out serious medical conditions.  Refer you to a mental health professional for further evaluation.  How is this treated? Treatment depends on the cause of the panic attack:  If the cause is a medical problem, your health care provider will either treat that problem or refer you to a specialist.  If the cause is emotional, you may be given anti-anxiety medicines or referred to a counselor. These medicines may  reduce how often attacks happen, reduce how severe the attacks are, and lower anxiety.  If the cause is a medicine, your health care provider may tell you to stop the medicine, change your dose, or take a different medicine.  If the cause is a drug, treatment may involve letting the drug wear off and taking medicine to help the drug leave your body or to counteract its effects. Attacks caused by drug abuse may continue even if you stop using the drug.  Follow these instructions at home:  Take over-the-counter and prescription medicines only as told by your health care provider.  If you feel anxious, limit your caffeine intake.  Take good care of your physical and mental health by: ? Eating a balanced diet that includes plenty of fresh fruits and vegetables, whole grains, lean meats, and low-fat dairy. ? Getting plenty of rest. Try to get 7-8 hours of uninterrupted sleep each night. ? Exercising regularly. Try to get 30 minutes of physical activity at least 5 days a week. ? Not smoking. Talk to your health care provider if you need help quitting. ? Limiting alcohol intake to no more than 1 drink a day for nonpregnant women and 2 drinks a day for men. One drink equals 12 oz of beer, 5 oz of wine, or 1 oz of hard liquor.  Keep all follow-up visits as told by your health care provider. This is important. Panic attacks may have underlying physical or emotional problems that take time to accurately diagnose. Contact a health care provider if:  Your symptoms do not improve, or they get worse.  You are not able to take your medicine as prescribed because of side effects. Get help right away if:  You have serious thoughts about hurting yourself or others.  You have symptoms of a panic attack. Do not drive yourself to the hospital. Have someone else drive you or call an ambulance. If you ever feel like you may hurt yourself or others, or you have thoughts about taking your own life, get help  right away. You can go to your nearest emergency department or call:  Your local emergency services (911 in the [place]  A suicide crisis helpline, such as the [facility] at 8166540035. This is open 24 hours a day.  Summary  A panic attack is a sign of a serious health or mental health condition. Get help right away. Do not drive yourself to the hospital. Have someone else drive you or call an ambulance.  Always see a health care provider to have the reasons for the panic attack correctly diagnosed.  If  your panic attack was caused by a physical problem, follow your health care provider's suggestions for medicine, referral to a specialist, and lifestyle changes.  If your panic attack was caused by an emotional problem, follow through with counseling from a qualified mental health specialist.  If you feel like you may hurt yourself or others, call 911 and get help right away. This information is not intended to replace advice given to you by your health care provider. Make sure you discuss any questions you have with your health care provider. Document Released: 09/21/2005 Document Revised: 10/30/2016 Document Reviewed: 10/30/2016 Elsevier Interactive Patient Education  2018 [employer]. Aanvi Voyles M.D.

## 2018-05-24 NOTE — Patient Instructions (Addendum)
Please get  Lab fasting  Within the week.   Begin  Controller medication and  Rescue  Medication  For panic as needed .  Plan ROV in 3-4 weeks or as needed.     Living With Anxiety After being diagnosed with an anxiety disorder, you may be relieved to know why you have felt or behaved a certain way. It is natural to also feel overwhelmed about the treatment ahead and what it will mean for your life. With care and support, you can manage this condition and recover from it. How to cope with anxiety Dealing with stress Stress is your body's reaction to life changes and events, both good and bad. Stress can last just a few hours or it can be ongoing. Stress can play a major role in anxiety, so it is important to learn both how to cope with stress and how to think about it differently. Talk with your health care provider or a counselor to learn more about stress reduction. He or she may suggest some stress reduction techniques, such as:  Music therapy. This can include creating or listening to music that you enjoy and that inspires you.  Mindfulness-based meditation. This involves being aware of your normal breaths, rather than trying to control your breathing. It can be done while sitting or walking.  Centering prayer. This is a kind of meditation that involves focusing on a word, phrase, or sacred image that is meaningful to you and that brings you peace.  Deep breathing. To do this, expand your stomach and inhale slowly through your nose. Hold your breath for 3-5 seconds. Then exhale slowly, allowing your stomach muscles to relax.  Self-talk. This is a skill where you identify thought patterns that lead to anxiety reactions and correct those thoughts.  Muscle relaxation. This involves tensing muscles then relaxing them.  Choose a stress reduction technique that fits your lifestyle and personality. Stress reduction techniques take time and practice. Set aside 5-15 minutes a day to do them.  Therapists can offer training in these techniques. The training may be covered by some insurance plans. Other things you can do to manage stress include:  Keeping a stress diary. This can help you learn what triggers your stress and ways to control your response.  Thinking about how you respond to certain situations. You may not be able to control everything, but you can control your reaction.  Making time for activities that help you relax, and not feeling guilty about spending your time in this way.  Therapy combined with coping and stress-reduction skills provides the best chance for successful treatment. Medicines Medicines can help ease symptoms. Medicines for anxiety include:  Anti-anxiety drugs.  Antidepressants.  Beta-blockers.  Medicines may be used as the main treatment for anxiety disorder, along with therapy, or if other treatments are not working. Medicines should be prescribed by a health care provider. Relationships Relationships can play a big part in helping you recover. Try to spend more time connecting with trusted friends and family members. Consider going to couples counseling, taking family education classes, or going to family therapy. Therapy can help you and others better understand the condition. How to recognize changes in your condition Everyone has a different response to treatment for anxiety. Recovery from anxiety happens when symptoms decrease and stop interfering with your daily activities at home or work. This may mean that you will start to:  Have better concentration and focus.  Sleep better.  Be less irritable.  Have  more energy.  Have improved memory.  It is important to recognize when your condition is getting worse. Contact your health care provider if your symptoms interfere with home or work and you do not feel like your condition is improving. Where to find help and support: You can get help and support from these sources:  Self-help  groups.  Online and [employer].  A trusted spiritual leader.  Couples counseling.  Family education classes.  Family therapy.  Follow these instructions at home:  Eat a healthy diet that includes plenty of vegetables, fruits, whole grains, low-fat dairy products, and lean protein. Do not eat a lot of foods that are high in solid fats, added sugars, or salt.  Exercise. Most adults should do the following: ? Exercise for at least 150 minutes each week. The exercise should increase your heart rate and make you sweat (moderate-intensity exercise). ? Strengthening exercises at least twice a week.  Cut down on caffeine, tobacco, alcohol, and other potentially harmful substances.  Get the right amount and quality of sleep. Most adults need 7-9 hours of sleep each night.  Make choices that simplify your life.  Take over-the-counter and prescription medicines only as told by your health care provider.  Avoid caffeine, alcohol, and certain over-the-counter cold medicines. These may make you feel worse. Ask your pharmacist which medicines to avoid.  Keep all follow-up visits as told by your health care provider. This is important. Questions to ask your health care provider  Would I benefit from therapy?  How often should I follow up with a health care provider?  How long do I need to take medicine?  Are there any long-term side effects of my medicine?  Are there any alternatives to taking medicine? Contact a health care provider if:  You have a hard time staying focused or finishing daily tasks.  You spend many hours a day feeling worried about everyday life.  You become exhausted by worry.  You start to have headaches, feel tense, or have nausea.  You urinate more than normal.  You have diarrhea. Get help right away if:  You have a racing heart and shortness of breath.  You have thoughts of hurting yourself or others. If you ever feel like you may hurt  yourself or others, or have thoughts about taking your own life, get help right away. You can go to your nearest emergency department or call:  Your local emergency services (911 in the [place]  A suicide crisis helpline, such as the [place] at 787-754-4051. This is open 24-hours a day.  Summary  Taking steps to deal with stress can help calm you.  Medicines cannot cure anxiety disorders, but they can help ease symptoms.  Family, friends, and partners can play a big part in helping you recover from an anxiety disorder. This information is not intended to replace advice given to you by your health care provider. Make sure you discuss any questions you have with your health care provider. Document Released: 09/15/2016 Document Revised: 09/15/2016 Document Reviewed: 09/15/2016 Elsevier Interactive Patient Education  2018 [employer].    Panic Attack A panic attack is a sudden episode of severe anxiety, fear, or discomfort that causes physical and emotional symptoms. The attack may be in response to something frightening, or it may occur for no known reason. Symptoms of a panic attack can be similar to symptoms of a heart attack or stroke. It is important to see your health care provider when  you have a panic attack so that these conditions can be ruled out. A panic attack is a symptom of another condition. Most panic attacks go away with treatment of the underlying problem. If you have panic attacks often, you may have a condition called panic disorder. What are the causes? A panic attack may be caused by:  An extreme, life-threatening situation, such as a war or natural disaster.  An anxiety disorder, such as post-traumatic stress disorder.  Depression.  Certain medical conditions, including heart problems, neurological conditions, and infections.  Certain over-the-counter and prescription medicines.  Illegal drugs that increase heart rate and blood  pressure, such as methamphetamine.  Alcohol.  Supplements that increase anxiety.  Panic disorder.  What increases the risk? You are more likely to develop this condition if:  You have an anxiety disorder.  You have another mental health condition.  You take certain medicines.  You use alcohol, illegal drugs, or other substances.  You are under extreme stress.  A life event is causing increased feelings of anxiety and depression.  What are the signs or symptoms? A panic attack starts suddenly, usually lasts about 20 minutes, and occurs with one or more of the following:  A pounding heart.  A feeling that your heart is beating irregularly or faster than normal (palpitations).  Sweating.  Trembling or shaking.  Shortness of breath or feeling smothered.  Feeling choked.  Chest pain or discomfort.  Nausea or a strange feeling in your stomach.  Dizziness, feeling lightheaded, or feeling like you might faint.  Chills or hot flashes.  Numbness or tingling in your lips, hands, or feet.  Feeling confused, or feeling that you are not yourself.  Fear of losing control or being emotionally unstable.  Fear of dying.  How is this diagnosed? A panic attack is diagnosed with an assessment by your health care provider. During the assessment your health care provider will ask questions about:  Your history of anxiety, depression, and panic attacks.  Your medical history.  Whether you drink alcohol, use illegal drugs, take supplements, or take medicines. Be honest about your substance use.  Your health care provider may also:  Order blood tests or other kinds of tests to rule out serious medical conditions.  Refer you to a mental health professional for further evaluation.  How is this treated? Treatment depends on the cause of the panic attack:  If the cause is a medical problem, your health care provider will either treat that problem or refer you to a  specialist.  If the cause is emotional, you may be given anti-anxiety medicines or referred to a counselor. These medicines may reduce how often attacks happen, reduce how severe the attacks are, and lower anxiety.  If the cause is a medicine, your health care provider may tell you to stop the medicine, change your dose, or take a different medicine.  If the cause is a drug, treatment may involve letting the drug wear off and taking medicine to help the drug leave your body or to counteract its effects. Attacks caused by drug abuse may continue even if you stop using the drug.  Follow these instructions at home:  Take over-the-counter and prescription medicines only as told by your health care provider.  If you feel anxious, limit your caffeine intake.  Take good care of your physical and mental health by: ? Eating a balanced diet that includes plenty of fresh fruits and vegetables, whole grains, lean meats, and low-fat dairy. ?  Getting plenty of rest. Try to get 7-8 hours of uninterrupted sleep each night. ? Exercising regularly. Try to get 30 minutes of physical activity at least 5 days a week. ? Not smoking. Talk to your health care provider if you need help quitting. ? Limiting alcohol intake to no more than 1 drink a day for nonpregnant women and 2 drinks a day for men. One drink equals 12 oz of beer, 5 oz of wine, or 1 oz of hard liquor.  Keep all follow-up visits as told by your health care provider. This is important. Panic attacks may have underlying physical or emotional problems that take time to accurately diagnose. Contact a health care provider if:  Your symptoms do not improve, or they get worse.  You are not able to take your medicine as prescribed because of side effects. Get help right away if:  You have serious thoughts about hurting yourself or others.  You have symptoms of a panic attack. Do not drive yourself to the hospital. Have someone else drive you or call  an ambulance. If you ever feel like you may hurt yourself or others, or you have thoughts about taking your own life, get help right away. You can go to your nearest emergency department or call:  Your local emergency services (911 in the [place]  A suicide crisis helpline, such as the [place] at (832)472-4191. This is open 24 hours a day.  Summary  A panic attack is a sign of a serious health or mental health condition. Get help right away. Do not drive yourself to the hospital. Have someone else drive you or call an ambulance.  Always see a health care provider to have the reasons for the panic attack correctly diagnosed.  If your panic attack was caused by a physical problem, follow your health care provider's suggestions for medicine, referral to a specialist, and lifestyle changes.  If your panic attack was caused by an emotional problem, follow through with counseling from a qualified mental health specialist.  If you feel like you may hurt yourself or others, call 911 and get help right away. This information is not intended to replace advice given to you by your health care provider. Make sure you discuss any questions you have with your health care provider. Document Released: 09/21/2005 Document Revised: 10/30/2016 Document Reviewed: 10/30/2016 Elsevier Interactive Patient Education  [employer].

## 2018-05-25 ENCOUNTER — Other Ambulatory Visit: Payer: Self-pay | Admitting: Internal Medicine

## 2018-05-26 ENCOUNTER — Other Ambulatory Visit: Payer: BLUE CROSS/BLUE SHIELD

## 2018-06-19 ENCOUNTER — Other Ambulatory Visit: Payer: Self-pay | Admitting: Internal Medicine

## 2018-06-29 ENCOUNTER — Other Ambulatory Visit: Payer: BLUE CROSS/BLUE SHIELD

## 2018-07-12 ENCOUNTER — Other Ambulatory Visit: Payer: BLUE CROSS/BLUE SHIELD

## 2018-07-19 ENCOUNTER — Other Ambulatory Visit (INDEPENDENT_AMBULATORY_CARE_PROVIDER_SITE_OTHER): Payer: BLUE CROSS/BLUE SHIELD

## 2018-07-19 DIAGNOSIS — Z981 Arthrodesis status: Secondary | ICD-10-CM

## 2018-07-19 DIAGNOSIS — F410 Panic disorder [episodic paroxysmal anxiety] without agoraphobia: Secondary | ICD-10-CM

## 2018-07-19 DIAGNOSIS — R000 Tachycardia, unspecified: Secondary | ICD-10-CM

## 2018-07-19 DIAGNOSIS — E785 Hyperlipidemia, unspecified: Secondary | ICD-10-CM

## 2018-07-19 DIAGNOSIS — Z79899 Other long term (current) drug therapy: Secondary | ICD-10-CM

## 2018-07-19 DIAGNOSIS — I10 Essential (primary) hypertension: Secondary | ICD-10-CM

## 2018-07-19 LAB — LIPID PANEL
Cholesterol: 313 mg/dL — ABNORMAL HIGH (ref 0–200)
HDL: 77.1 mg/dL (ref >39.00)
LDL Cholesterol: 216 mg/dL — ABNORMAL HIGH (ref 0–99)
NONHDL: 235.45
Total CHOL/HDL Ratio: 4
Triglycerides: 96 mg/dL (ref 0.0–149.0)
VLDL: 19.2 mg/dL (ref 0.0–40.0)

## 2018-07-19 LAB — BASIC METABOLIC PANEL
BUN: 8 mg/dL (ref 6–23)
CALCIUM: 10 mg/dL (ref 8.4–10.5)
CO2: 31 mEq/L (ref 19–32)
CREATININE: 0.93 mg/dL (ref 0.40–1.50)
Chloride: 99 mEq/L (ref 96–112)
GFR: 113.34 mL/min (ref >60.00)
Glucose, Bld: 251 mg/dL — ABNORMAL HIGH (ref 70–99)
Potassium: 4.4 mEq/L (ref 3.5–5.1)
SODIUM: 139 meq/L (ref 135–145)

## 2018-07-19 LAB — TSH: TSH: 0.67 u[IU]/mL (ref 0.35–4.50)

## 2018-07-19 LAB — HEPATIC FUNCTION PANEL
ALBUMIN: 4.6 g/dL (ref 3.5–5.2)
ALK PHOS: 69 U/L (ref 39–117)
ALT: 66 U/L — AB (ref 0–53)
AST: 39 U/L — ABNORMAL HIGH (ref 0–37)
BILIRUBIN DIRECT: 0.2 mg/dL (ref 0.0–0.3)
TOTAL PROTEIN: 7.3 g/dL (ref 6.0–8.3)
Total Bilirubin: 0.7 mg/dL (ref 0.2–1.2)

## 2018-07-19 LAB — T4, FREE: Free T4: 0.68 ng/dL (ref 0.60–1.60)

## 2018-07-19 LAB — HEMOGLOBIN A1C: Hgb A1c MFr Bld: 8.3 % — ABNORMAL HIGH (ref 4.6–6.5)

## 2018-07-22 ENCOUNTER — Other Ambulatory Visit: Payer: Self-pay | Admitting: Internal Medicine

## 2018-07-23 ENCOUNTER — Other Ambulatory Visit: Payer: Self-pay | Admitting: Internal Medicine

## 2018-07-25 MED ORDER — GLIMEPIRIDE 4 MG PO TABS: 6.0000 mg | ORAL_TABLET | Freq: Every day | ORAL | 2 refills | Status: DC

## 2018-07-25 MED ORDER — CITALOPRAM HYDROBROMIDE 20 MG PO TABS: ORAL_TABLET | ORAL | 1 refills | Status: DC

## 2018-08-04 NOTE — Progress Notes (Deleted)
No chief complaint on file.   HPI: Todd Rodriguez 42 y.o. come in for Chronic disease management  ROS: See pertinent positives and negatives per HPI.  Past Medical History:  Diagnosis Date  . Arthritis   . Diabetes mellitus without complication ([place])   . GERD (gastroesophageal reflux disease)   . Hypertension   . Pancreatitis     Family History  Problem Relation Age of Onset  . Diabetes Mother   . Hypertension Mother   . Diabetes Father   . Hypertension Father   . Arthritis Maternal Grandmother   . Hypertension Maternal Grandfather   . Diabetes Maternal Grandfather   . Colon cancer Neg Hx   . Pancreatitis Neg Hx   . Pancreatic cancer Neg Hx     Social History   Socioeconomic History  . Marital status: Married    Spouse name: Not on file  . Number of children: 1  . Years of education: Not on file  . Highest education level: Not on file  Occupational History    Employer: [employer]  . Financial resource strain: Not on file  . Food insecurity:    Worry: Not on file    Inability: Not on file  . Transportation needs:    Medical: Not on file    Non-medical: Not on file  Tobacco Use  . Smoking status: Current Every Day Smoker    Packs/day: 1.00    Types: Cigarettes  . Smokeless tobacco: Never Used  Substance and Sexual Activity  . Alcohol use: Yes    Comment: every weekend  . Drug use: No  . Sexual activity: Yes    Partners: Female    Comment: Patient's Wife  Lifestyle  . Physical activity:    Days per week: Not on file    Minutes per session: Not on file  . Stress: Not on file  Relationships  . Social connections:    Talks on phone: Not on file    Gets together: Not on file    Attends religious service: Not on file    Active member of club or organization: Not on file    Attends meetings of clubs or organizations: Not on file    Relationship status: Not on file  Other Topics Concern  . Not on file  Social History Narrative     5 hours of sleep    2 people living in the home   A dog living in the home   [employer]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   Eye exam nany clark    FA tobacco some etoh neg rd exercises      Plays drummer in a band              Outpatient Medications Prior to Visit  Medication Sig Dispense Refill  . amLODipine-benazepril (LOTREL) 10-40 MG capsule Take 1 capsule by mouth daily.    Marland Kitchen atorvastatin (LIPITOR) 20 MG tablet TAKE 1 TABLET (20 MG TOTAL) BY MOUTH DAILY. 90 tablet 0  . citalopram (CELEXA) 20 MG tablet TAKE 1/2 TABLET PER DAY FOR 1 WEEK , THEN INCREASE TO 1 TAB DAILY 30 tablet 0  . citalopram (CELEXA) 20 MG tablet 10 mg per day for 1 week , then increase to 20 mg per day 30 tablet 1  . glimepiride (AMARYL) 4 MG tablet Take 1.5 tablets (6 mg total) by mouth daily before breakfast. 45 tablet 2  . glucose blood (ONE TOUCH  TEST STRIPS) test strip Use as instructed 100 each 11  . JARDIANCE 10 MG TABS tablet TAKE 1 TAB BY MOUTH DAILY. DUE FOR LAB WORK 30 tablet 0  . LORazepam (ATIVAN) 1 MG tablet Take 1 tablet (1 mg total) by mouth every 8 (eight) hours as needed for anxiety. Panic attacks 20 tablet 0  . metFORMIN (GLUCOPHAGE-XR) 500 MG 24 hr tablet TAKE 2 TABLETS (1,000 MG TOTAL) BY MOUTH 2 (TWO) TIMES DAILY. 120 tablet 2  . methocarbamol (ROBAXIN) 500 MG tablet Take 1 tablet (500 mg total) by mouth 3 (three) times daily. 30 tablet 0  . ondansetron (ZOFRAN ODT) 4 MG disintegrating tablet Take 1 tablet (4 mg total) by mouth every 8 (eight) hours as needed for nausea or vomiting. 20 tablet 0  . pantoprazole (PROTONIX) 40 MG tablet TAKE 1 TABLET BY MOUTH TWICE A DAY 60 tablet 0   No facility-administered medications prior to visit.      EXAM:  There were no vitals taken for this visit.  There is no height or weight on file to calculate BMI.  GENERAL: vitals reviewed and listed above, alert, oriented, appears well hydrated and in no acute  distress HEENT: atraumatic, conjunctiva  clear, no obvious abnormalities on inspection of external nose and ears OP : no lesion edema or exudate  NECK: no obvious masses on inspection palpation  LUNGS: clear to auscultation bilaterally, no wheezes, rales or rhonchi, good air movement CV: HRRR, no clubbing cyanosis or  peripheral edema nl cap refill  MS: moves all extremities without noticeable focal  abnormality PSYCH: pleasant and cooperative, no obvious depression or anxiety Lab Results  Component Value Date   WBC 6.6 01/05/2018   HGB 16.9 01/05/2018   HCT 49.2 01/05/2018   PLT 191 01/05/2018   GLUCOSE 251 (H) 07/19/2018   CHOL 313 (H) 07/19/2018   TRIG 96.0 07/19/2018   HDL 77.10 07/19/2018   LDLDIRECT 115.0 01/07/2016   LDLCALC 216 (H) 07/19/2018   ALT 66 (H) 07/19/2018   AST 39 (H) 07/19/2018   NA 139 07/19/2018   K 4.4 07/19/2018   CL 99 07/19/2018   CREATININE 0.93 07/19/2018   BUN 8 07/19/2018   CO2 31 07/19/2018   TSH 0.67 07/19/2018   PSA 0.76 01/07/2016   HGBA1C 8.3 (H) 07/19/2018   MICROALBUR 1.5 12/17/2016   BP Readings from Last 3 Encounters:  05/24/18 132/80  01/13/18 (!) 147/92  01/05/18 (!) 152/88    ASSESSMENT AND PLAN:  Discussed the following assessment and plan:  Diabetes mellitus type 2, uncontrolled, without complications (HCC)  Essential hypertension, benign  History of fusion of cervical spine  S/P cervical spinal fusion  Tobacco dependence  Hyperlipidemia, unspecified hyperlipidemia type  Abnormal LFTs Difficult situatin for him   Dm now out of control  worswe   Begin nkisuline  Basal and  consdier getting  Endo consult  He has hx of pancreatitis so some meds are not advised  -Patient advised to return or notify health care team  if  new concerns arise.  Lipids  Very high  And lfts  transaminitis poss from  Dm / other  Need There are no Patient Instructions on file for this visit.   Standley Brooking. Zalen Sequeira M.D.

## 2018-08-05 ENCOUNTER — Ambulatory Visit: Payer: BLUE CROSS/BLUE SHIELD | Admitting: Internal Medicine

## 2018-08-05 DIAGNOSIS — Z0289 Encounter for other administrative examinations: Secondary | ICD-10-CM

## 2018-09-09 NOTE — Progress Notes (Signed)
Chief Complaint  Patient presents with  . Follow-up    lab results  . Medication Management  . Cough  . Hypertension  . Diabetes  . Depression    HPI: Oral Todd [place] 42 y.o. come in for Chronic disease management   has Diabetes mellitus type 2, controlled, without complications (Todd [place]); Essential hypertension, benign; Leukocytosis, unspecified; GERD (gastroesophageal reflux disease); Hx of acute pancreatitis; Tobacco dependence; Other and unspecified hyperlipidemia; Fever, unspecified; Hand cramps; Cramps, extremity; Diabetes mellitus type 2, uncontrolled, without complications (Todd Rodriguez); History of fusion of cervical spine; Carpal tunnel syndrome of left wrist; Cervical radiculopathy; Stenosis of cervical spine with myelopathy (HCC); S/P cervical spinal fusion; Abnormal LFTs; and Medication management on their problem list.    Missed last appts   Hs out of control DM  And delkayed fu cause of  disability realted to cervile neck disease  And now reports depression is difficult .   Hiring a lawyer to get t through the  Process   Has permanent ue numbness and weakness?    Still tobacco now has   1 week sof ur congesitn and cough  Clear mucous at this time no fever  Some sob wheeze .   Has missed meds over the past months and just restarted beginning of December  Had been off for over 3 weeks   Or more    In pain management :Seeing  Dr Nelva Bush  Gabapentin.   Depression    Problematic   Asks about counseling  No suicidal lives in [place]  Another loss  [name] passed in November fam supportive   Dm no new sx and no lows ROS: See pertinent positives and negatives per HPI. No cp syncope  No etoh rd   Past Medical History:  Diagnosis Date  . Acute pancreatitis 10/25/2013  . Arthritis   . Diabetes mellitus without complication ([place])   . GERD (gastroesophageal reflux disease)   . Hypertension   . Myalgia and myositis 04/21/2014   much better from illness presumed infectious    has disability  form  will complete    . Pancreatitis     Family History  Problem Relation Age of Onset  . Diabetes Mother   . Hypertension Mother   . Diabetes Father   . Hypertension Father   . Arthritis Maternal Grandmother   . Hypertension Maternal Grandfather   . Diabetes Maternal Grandfather   . Colon cancer Neg Hx   . Pancreatitis Neg Hx   . Pancreatic cancer Neg Hx     Social History   Socioeconomic History  . Marital status: Married    Spouse name: Not on file  . Number of children: 1  . Years of education: Not on file  . Highest education level: Not on file  Occupational History    Employer: [employer]  . Financial resource strain: Not on file  . Food insecurity:    Worry: Not on file    Inability: Not on file  . Transportation needs:    Medical: Not on file    Non-medical: Not on file  Tobacco Use  . Smoking status: Current Every Day Smoker    Packs/day: 1.00    Types: Cigarettes  . Smokeless tobacco: Never Used  Substance and Sexual Activity  . Alcohol use: Yes    Comment: every weekend  . Drug use: No  . Sexual activity: Yes    Partners: Female    Comment: Patient's Wife  Lifestyle  . Physical  activity:    Days per week: Not on file    Minutes per session: Not on file  . Stress: Not on file  Relationships  . Social connections:    Talks on phone: Not on file    Gets together: Not on file    Attends religious service: Not on file    Active member of club or organization: Not on file    Attends meetings of clubs or organizations: Not on file    Relationship status: Not on file  Other Topics Concern  . Not on file  Social History Narrative   5 hours of sleep    2 people living in the home   A dog living in the home   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   Eye exam nany clark    FA tobacco some etoh neg rd exercises      Plays drummer in a band              Outpatient Medications Prior to  Visit  Medication Sig Dispense Refill  . amLODipine-benazepril (LOTREL) 10-40 MG capsule Take 1 capsule by mouth daily.    Marland Kitchen atorvastatin (LIPITOR) 20 MG tablet TAKE 1 TABLET (20 MG TOTAL) BY MOUTH DAILY. 90 tablet 0  . citalopram (CELEXA) 20 MG tablet 10 mg per day for 1 week , then increase to 20 mg per day 30 tablet 1  . glimepiride (AMARYL) 4 MG tablet Take 1.5 tablets (6 mg total) by mouth daily before breakfast. 45 tablet 2  . glucose blood (ONE TOUCH TEST STRIPS) test strip Use as instructed 100 each 11  . LORazepam (ATIVAN) 1 MG tablet Take 1 tablet (1 mg total) by mouth every 8 (eight) hours as needed for anxiety. Panic attacks 20 tablet 0  . metFORMIN (GLUCOPHAGE-XR) 500 MG 24 hr tablet TAKE 2 TABLETS (1,000 MG TOTAL) BY MOUTH 2 (TWO) TIMES DAILY. 120 tablet 2  . ondansetron (ZOFRAN ODT) 4 MG disintegrating tablet Take 1 tablet (4 mg total) by mouth every 8 (eight) hours as needed for nausea or vomiting. 20 tablet 0  . pantoprazole (PROTONIX) 40 MG tablet TAKE 1 TABLET BY MOUTH TWICE A DAY 60 tablet 0  . JARDIANCE 10 MG TABS tablet TAKE 1 TAB BY MOUTH DAILY. DUE FOR LAB WORK 30 tablet 0  . methocarbamol (ROBAXIN) 500 MG tablet Take 1 tablet (500 mg total) by mouth 3 (three) times daily. 30 tablet 0  . citalopram (CELEXA) 20 MG tablet TAKE 1/2 TABLET PER DAY FOR 1 WEEK , THEN INCREASE TO 1 TAB DAILY (Patient not taking: Reported on 09/12/2018) 30 tablet 0   No facility-administered medications prior to visit.      EXAM:  BP (!) 150/100 (BP Location: Left Arm, Patient Position: Sitting, Cuff Size: Normal)   Pulse 98   Temp 98.4 F (36.9 C) (Oral)   Wt 197 lb 9.6 oz (89.6 kg)   SpO2 95%   BMI 25.37 kg/m   Body mass index is 25.37 kg/m.  GENERAL: vitals reviewed and listed above, alert, oriented, appears well hydrated and in no acute distress congested in nad   HEENT: atraumatic, conjunctiva  clear, no obvious abnormalities on inspection of external nose and ears tmx clear OP :  no lesion edema or exudate  NECK: no obvious masses on inspection palpation  LUNGS: rare exp wheeze no rales  Some dec bs?  No resp distress  CV: HRRR, no clubbing cyanosis  or  peripheral edema nl cap refill  MS: moves all extremities without noticeable ue seem atrophic left more than ridhg t Somewhat flat affect   Sad    Nl speech and thought and attention Lab Results  Component Value Date   WBC 6.6 01/05/2018   HGB 16.9 01/05/2018   HCT 49.2 01/05/2018   PLT 191 01/05/2018   GLUCOSE 251 (H) 07/19/2018   CHOL 313 (H) 07/19/2018   TRIG 96.0 07/19/2018   HDL 77.10 07/19/2018   LDLDIRECT 115.0 01/07/2016   LDLCALC 216 (H) 07/19/2018   ALT 66 (H) 07/19/2018   AST 39 (H) 07/19/2018   NA 139 07/19/2018   K 4.4 07/19/2018   CL 99 07/19/2018   CREATININE 0.93 07/19/2018   BUN 8 07/19/2018   CO2 31 07/19/2018   TSH 0.67 07/19/2018   PSA 0.76 01/07/2016   HGBA1C 8.3 (H) 07/19/2018   MICROALBUR 1.5 12/17/2016   BP Readings from Last 3 Encounters:  09/12/18 (!) 150/100  05/24/18 132/80  01/13/18 (!) 147/92  disc  lab from October  Ct abd pelvis in  Jan 2017    hepatic steatosis  In 2015 on [place] X ray no acut pna etc ASSESSMENT AND PLAN:  Discussed the following assessment and plan:  Diabetes mellitus type 2, uncontrolled, without complications ([place]) - Plan: Basic metabolic panel, Hepatic function panel, Lipid panel, Hemoglobin A1c  Medication management - Plan: Basic metabolic panel, Hepatic function panel, Lipid panel, Hemoglobin A1c  Tobacco dependence - Plan: DG Chest 2 View, DG Chest 2 View  Essential hypertension, benign - Plan: Basic metabolic panel, Hepatic function panel, Lipid panel, Hemoglobin A1c  History of fusion of cervical spine  Abnormal LFTs - Plan: Basic metabolic panel, Hepatic function panel, Lipid panel, Hemoglobin A1c  Respiratory tract congestion with cough - prob viral resp    in a smoker - Plan: DG Chest 2 View, DG Chest 2 View  Reactive depression  - on going  going ? back on medication and I agree with counseling   [place] area needed   Need for pneumococcal vaccination - Plan: Pneumococcal polysaccharide vaccine 23-valent greater than or equal to 2yo subcutaneous/IM   non-adherence to medical treatment  but back on meds now - see text extenuating circumstance  Non adherence to meds in past months cause of and factors and back on meds for a few weeks   Will follow  Disc  counseling  Help and pathways and I agree may help with his many losses and life changes  Inc jardiance  to 25   See instruction   Close fu advised to get back on track Total visit 50mns > 50% spent counseling and coordinating care as indicated in above note and in instructions to patient .   -Patient advised to return or notify health care team  if  new concerns arise. In  Interim.   Patient Instructions  Get back on your medications as planned   Slow increase metformin and  Increasing jardiance  Dose . ( if   bg I slow we are going to dec the glimepramiede dose )   Your blood pressure is up today. Hopefully will come down when back of med for a while.  I agree with counseling about the  Depression and   Circumstances  With losses and physical changes   Call the  Insurance  co  ( number on your card for 3-4 names  For options in network for counseling .  Plan fu lab when you come back in .  Or pre visit   If getting fever   Coughing up  Infected phelgm contact us for  Advice   Can try adding albuterol for the wheezing .as needed      Standley Brooking. Jasnoor Trussell M.D.

## 2018-09-12 ENCOUNTER — Ambulatory Visit: Payer: BLUE CROSS/BLUE SHIELD | Admitting: Internal Medicine

## 2018-09-12 ENCOUNTER — Ambulatory Visit (INDEPENDENT_AMBULATORY_CARE_PROVIDER_SITE_OTHER): Payer: BLUE CROSS/BLUE SHIELD

## 2018-09-12 ENCOUNTER — Other Ambulatory Visit: Payer: Self-pay | Admitting: Internal Medicine

## 2018-09-12 ENCOUNTER — Encounter: Payer: Self-pay | Admitting: Internal Medicine

## 2018-09-12 VITALS — BP 150/100 | HR 98 | Temp 98.4°F | Wt 197.6 lb

## 2018-09-12 DIAGNOSIS — E1165 Type 2 diabetes mellitus with hyperglycemia: Secondary | ICD-10-CM

## 2018-09-12 DIAGNOSIS — Z9119 Patient's noncompliance with other medical treatment and regimen: Secondary | ICD-10-CM

## 2018-09-12 DIAGNOSIS — IMO0001 Reserved for inherently not codable concepts without codable children: Secondary | ICD-10-CM

## 2018-09-12 DIAGNOSIS — I10 Essential (primary) hypertension: Secondary | ICD-10-CM

## 2018-09-12 DIAGNOSIS — R7989 Other specified abnormal findings of blood chemistry: Secondary | ICD-10-CM | POA: Insufficient documentation

## 2018-09-12 DIAGNOSIS — R058 Other specified cough: Secondary | ICD-10-CM

## 2018-09-12 DIAGNOSIS — Z91199 Patient's noncompliance with other medical treatment and regimen due to unspecified reason: Secondary | ICD-10-CM

## 2018-09-12 DIAGNOSIS — F17200 Nicotine dependence, unspecified, uncomplicated: Secondary | ICD-10-CM

## 2018-09-12 DIAGNOSIS — R05 Cough: Secondary | ICD-10-CM

## 2018-09-12 DIAGNOSIS — F329 Major depressive disorder, single episode, unspecified: Secondary | ICD-10-CM

## 2018-09-12 DIAGNOSIS — Z23 Encounter for immunization: Secondary | ICD-10-CM | POA: Diagnosis not present

## 2018-09-12 DIAGNOSIS — R945 Abnormal results of liver function studies: Secondary | ICD-10-CM | POA: Insufficient documentation

## 2018-09-12 DIAGNOSIS — Z79899 Other long term (current) drug therapy: Secondary | ICD-10-CM

## 2018-09-12 DIAGNOSIS — Z981 Arthrodesis status: Secondary | ICD-10-CM

## 2018-09-12 MED ORDER — ALBUTEROL SULFATE HFA 108 (90 BASE) MCG/ACT IN AERS: 2.0000 | INHALATION_SPRAY | Freq: Four times a day (QID) | RESPIRATORY_TRACT | 1 refills | Status: DC | PRN

## 2018-09-12 MED ORDER — EMPAGLIFLOZIN 25 MG PO TABS: 25.0000 mg | ORAL_TABLET | Freq: Every day | ORAL | 6 refills | Status: DC

## 2018-09-12 NOTE — Patient Instructions (Addendum)
Get back on your medications as planned   Slow increase metformin and  Increasing jardiance  Dose . ( if   bg I slow we are going to dec the glimepramiede dose )   Your blood pressure is up today. Hopefully will come down when back of med for a while.  I agree with counseling about the  Depression and   Circumstances  With losses and physical changes   Call the  Insurance  co  ( number on your card for 3-4 names  For options in network for counseling .   Plan fu lab when you come back in .  Or pre visit   If getting fever   Coughing up  Infected phelgm contact us for  Advice   Can try adding albuterol for the wheezing .as needed

## 2018-09-14 ENCOUNTER — Other Ambulatory Visit: Payer: Self-pay

## 2018-09-16 MED ORDER — ATORVASTATIN CALCIUM 20 MG PO TABS: 20.0000 mg | ORAL_TABLET | Freq: Every day | ORAL | 0 refills | Status: DC

## 2018-09-19 ENCOUNTER — Other Ambulatory Visit: Payer: Self-pay | Admitting: Internal Medicine

## 2018-09-19 MED ORDER — ALBUTEROL SULFATE HFA 108 (90 BASE) MCG/ACT IN AERS: INHALATION_SPRAY | RESPIRATORY_TRACT | 1 refills | Status: DC

## 2018-09-19 NOTE — Addendum Note (Signed)
Addended by: Virl Cagey on: 09/19/2018 08:26 AM   Modules accepted: Orders

## 2018-09-22 ENCOUNTER — Other Ambulatory Visit: Payer: Self-pay | Admitting: Internal Medicine

## 2018-09-22 MED ORDER — AMLODIPINE BESY-BENAZEPRIL HCL 10-40 MG PO CAPS: 1.0000 | ORAL_CAPSULE | Freq: Every day | ORAL | 2 refills | Status: DC

## 2018-10-09 ENCOUNTER — Other Ambulatory Visit: Payer: Self-pay | Admitting: Internal Medicine

## 2018-10-20 ENCOUNTER — Other Ambulatory Visit: Payer: Self-pay | Admitting: Internal Medicine

## 2018-10-21 ENCOUNTER — Other Ambulatory Visit: Payer: Self-pay | Admitting: Internal Medicine

## 2018-11-15 NOTE — Progress Notes (Deleted)
No chief complaint on file.   HPI: Todd Rodriguez 43 y.o. come in for Chronic disease management  Delayed from past appts     ROS: See pertinent positives and negatives per HPI.  Past Medical History:  Diagnosis Date  . Acute pancreatitis 10/25/2013  . Arthritis   . Diabetes mellitus without complication ([place])   . GERD (gastroesophageal reflux disease)   . Hypertension   . Myalgia and myositis 04/21/2014   much better from illness presumed infectious    has disability form  will complete    . Pancreatitis     Family History  Problem Relation Age of Onset  . Diabetes Mother   . Hypertension Mother   . Diabetes Father   . Hypertension Father   . Arthritis Maternal Grandmother   . Hypertension Maternal Grandfather   . Diabetes Maternal Grandfather   . Colon cancer Neg Hx   . Pancreatitis Neg Hx   . Pancreatic cancer Neg Hx     Social History   Socioeconomic History  . Marital status: Married    Spouse name: Not on file  . Number of children: 1  . Years of education: Not on file  . Highest education level: Not on file  Occupational History    Employer: [employer]  . Financial resource strain: Not on file  . Food insecurity:    Worry: Not on file    Inability: Not on file  . Transportation needs:    Medical: Not on file    Non-medical: Not on file  Tobacco Use  . Smoking status: Current Every Day Smoker    Packs/day: 1.00    Types: Cigarettes  . Smokeless tobacco: Never Used  Substance and Sexual Activity  . Alcohol use: Yes    Comment: every weekend  . Drug use: No  . Sexual activity: Yes    Partners: Female    Comment: Patient's Wife  Lifestyle  . Physical activity:    Days per week: Not on file    Minutes per session: Not on file  . Stress: Not on file  Relationships  . Social connections:    Talks on phone: Not on file    Gets together: Not on file    Attends religious service: Not on file    Active member of club or  organization: Not on file    Attends meetings of clubs or organizations: Not on file    Relationship status: Not on file  Other Topics Concern  . 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 albuterol (PROVENTIL HFA;VENTOLIN HFA) 108 (90 Base) MCG/ACT inhaler TAKE 2 PUFFS BY MOUTH EVERY 6 HOURS AS NEEDED FOR WHEEZE OR SHORTNESS OF BREATH 6.7 Inhaler 1  . amLODipine-benazepril (LOTREL) 10-40 MG capsule Take 1 capsule by mouth daily. 30 capsule 2  . atorvastatin (LIPITOR) 20 MG tablet Take 1 tablet (20 mg total) by mouth daily. 90 tablet 0  . citalopram (CELEXA) 20 MG tablet TAKE 10  MG BY MOUTH PER DAY FOR 1 WEEK , THEN INCREASE TO 20 MG BY MOUTH PER DAY 24 tablet 0  . empagliflozin (JARDIANCE) 25 MG TABS tablet Take 25 mg by mouth daily. 30 tablet 6  . glimepiride (AMARYL) 4 MG tablet TAKE 1.5 TABLETS (6 MG TOTAL) BY MOUTH DAILY BEFORE BREAKFAST. 45 tablet 2  . glucose blood (ONE TOUCH TEST STRIPS) test strip Use as instructed 100 each 11  . LORazepam (ATIVAN) 1 MG tablet Take 1 tablet (1 mg total) by mouth every 8 (eight) hours as needed for anxiety. Panic attacks 20 tablet 0  . metFORMIN (GLUCOPHAGE-XR) 500 MG 24 hr tablet TAKE 2 TABLETS (1,000 MG TOTAL) BY MOUTH 2 (TWO) TIMES DAILY. 120 tablet 2  . ondansetron (ZOFRAN ODT) 4 MG disintegrating tablet Take 1 tablet (4 mg total) by mouth every 8 (eight) hours as needed for nausea or vomiting. 20 tablet 0  . pantoprazole (PROTONIX) 40 MG tablet TAKE 1 TABLET BY MOUTH TWICE A DAY (INS WILL ONLY PAY ONCE A DAY) 30 tablet 1   No facility-administered medications prior to visit.      EXAM:  There were no vitals taken  for this visit.  There is no height or weight on file to calculate BMI.  GENERAL: vitals reviewed and listed above, alert, oriented, appears well hydrated and in no acute distress HEENT: atraumatic, conjunctiva  clear, no obvious abnormalities on inspection of external nose and ears OP : no lesion edema or exudate  NECK: no obvious masses on inspection palpation  LUNGS: clear to auscultation bilaterally, no wheezes, rales or rhonchi, good air movement CV: HRRR, no clubbing cyanosis or  peripheral edema nl cap refill  MS: moves all extremities without noticeable focal  abnormality PSYCH: pleasant and cooperative, no obvious depression or anxiety Lab Results  Component Value Date   WBC 6.6 01/05/2018   HGB 16.9 01/05/2018   HCT 49.2 01/05/2018   PLT 191 01/05/2018   GLUCOSE 251 (H) 07/19/2018   CHOL 313 (H) 07/19/2018   TRIG 96.0 07/19/2018   HDL 77.10 07/19/2018   LDLDIRECT 115.0 01/07/2016   LDLCALC 216 (H) 07/19/2018   ALT 66 (H) 07/19/2018   AST 39 (H) 07/19/2018   NA 139 07/19/2018   K 4.4 07/19/2018   CL 99 07/19/2018   CREATININE 0.93 07/19/2018   BUN 8 07/19/2018   CO2 31 07/19/2018   TSH 0.67 07/19/2018   PSA 0.76 01/07/2016   HGBA1C 8.3 (H) 07/19/2018   MICROALBUR 1.5 12/17/2016   BP Readings from Last 3 Encounters:  09/12/18 (!) 150/100  05/24/18 132/80  01/13/18 (!) 147/92    ASSESSMENT AND PLAN:  Discussed the following assessment and plan:  Medication management  Essential hypertension, benign  Diabetes mellitus type 2, uncontrolled, without complications (HCC)  Hx of acute pancreatitis  History of fusion of cervical spine  Hyperlipidemia, unspecified hyperlipidemia type  Abnormal LFTs lfts :  -Patient advised to return or notify health care team  if  new concerns arise.  There are no Patient Instructions on file for this visit.   Standley Brooking. Kadi Hession M.D.

## 2018-11-16 ENCOUNTER — Ambulatory Visit: Payer: BLUE CROSS/BLUE SHIELD | Admitting: Internal Medicine

## 2018-11-16 DIAGNOSIS — Z0289 Encounter for other administrative examinations: Secondary | ICD-10-CM

## 2019-02-23 ENCOUNTER — Telehealth: Payer: Self-pay

## 2019-02-23 NOTE — Telephone Encounter (Signed)
lvm for pt to call back needs virtual visit crm created

## 2019-03-05 ENCOUNTER — Other Ambulatory Visit: Payer: Self-pay | Admitting: Internal Medicine

## 2019-03-10 ENCOUNTER — Telehealth: Payer: Self-pay | Admitting: Internal Medicine

## 2019-03-10 NOTE — Telephone Encounter (Signed)
Patients wife called in to make an appointment for the patient and the next CPE slot available is out into September. Is it possible to work him in sooner?  Please Advise

## 2019-03-13 NOTE — Telephone Encounter (Signed)
He needs a VISIT   Either  Virtual  Or  In person  with labs ( 30 min appt)   For fu of his diabetes and hypertension   medications.  Etc.  CPX can come later   When available ...,  Since I am not sure what  The current recommendations and limitations of in office  appts are at this time .

## 2019-03-14 ENCOUNTER — Ambulatory Visit (INDEPENDENT_AMBULATORY_CARE_PROVIDER_SITE_OTHER): Payer: BC Managed Care – PPO | Admitting: Internal Medicine

## 2019-03-14 ENCOUNTER — Other Ambulatory Visit: Payer: Self-pay

## 2019-03-14 DIAGNOSIS — I10 Essential (primary) hypertension: Secondary | ICD-10-CM | POA: Diagnosis not present

## 2019-03-14 DIAGNOSIS — F17200 Nicotine dependence, unspecified, uncomplicated: Secondary | ICD-10-CM | POA: Diagnosis not present

## 2019-03-14 DIAGNOSIS — Z79899 Other long term (current) drug therapy: Secondary | ICD-10-CM

## 2019-03-14 DIAGNOSIS — Z789 Other specified health status: Secondary | ICD-10-CM

## 2019-03-14 DIAGNOSIS — Z634 Disappearance and death of family member: Secondary | ICD-10-CM

## 2019-03-14 DIAGNOSIS — E1165 Type 2 diabetes mellitus with hyperglycemia: Secondary | ICD-10-CM

## 2019-03-14 DIAGNOSIS — F329 Major depressive disorder, single episode, unspecified: Secondary | ICD-10-CM

## 2019-03-14 DIAGNOSIS — R7989 Other specified abnormal findings of blood chemistry: Secondary | ICD-10-CM

## 2019-03-14 DIAGNOSIS — Z981 Arthrodesis status: Secondary | ICD-10-CM

## 2019-03-14 DIAGNOSIS — IMO0001 Reserved for inherently not codable concepts without codable children: Secondary | ICD-10-CM

## 2019-03-14 DIAGNOSIS — Z8719 Personal history of other diseases of the digestive system: Secondary | ICD-10-CM

## 2019-03-14 DIAGNOSIS — R634 Abnormal weight loss: Secondary | ICD-10-CM

## 2019-03-14 DIAGNOSIS — R945 Abnormal results of liver function studies: Secondary | ICD-10-CM

## 2019-03-14 DIAGNOSIS — F1090 Alcohol use, unspecified, uncomplicated: Secondary | ICD-10-CM

## 2019-03-14 MED ORDER — PANTOPRAZOLE SODIUM 40 MG PO TBEC: DELAYED_RELEASE_TABLET | ORAL | 5 refills | Status: DC

## 2019-03-14 NOTE — Telephone Encounter (Signed)
Pt has been scheduled for a virtual visit

## 2019-03-14 NOTE — Progress Notes (Signed)
Virtual Visit via Video Note  I connected with@ on 03/15/19 at 10:00 AM EDT by a video enabled telemedicine application and verified that I am speaking with the correct person using two identifiers. Location patient: home Location provider:work  office Persons participating in the virtual visit: patient, provider  [place] national recommendations  regarding COVID 19 pandemic   video visit is advised over in office visit for this patient.  Patient aware  of the limitations of evaluation and management by telemedicine and  availability of in person appointments. and agreed to proceed.   HPI:  Todd Rodriguez presents for video visit  Last visit 12 2019 since then  Sister has passed 03/14/23  Sudden Mo was on di=yalisis age 54  disability eval still pending delays cause of covid  But spine surgeon toled him he willl never be able to work at his old job again .    Bp taking meds  Ok  In 140/80 range and lower   BG not checking but on jardiance metformin and amaryl  No lows but had  A difficulty in the heat yesterday    Pancreas no abd pain?   Etoh drinking  Since  Sis death  ? How much 2 in am and 2 at night for "sleep" not sleeping well  Off and on . Decrease appetite and has weight loss   Needs refill protonix  talked with a work Forensic scientist and   No fu advised unless ( needed)    Feels scared  About how he is feeling    Cant do work DHj other activitis and at home    [place]  When possible    Denies  Eye changes  ROS: See pertinent positives and negatives per HPI.  Past Medical History:  Diagnosis Date  . Acute pancreatitis 10/25/2013  . Arthritis   . Diabetes mellitus without complication ([place])   . GERD (gastroesophageal reflux disease)   . Hypertension   . Myalgia and myositis 04/21/2014   much better from illness presumed infectious    has disability form  will complete    . Pancreatitis     Past Surgical History:  Procedure Laterality Date  . ANTERIOR CERVICAL  DECOMP/DISCECTOMY FUSION N/A 01/12/2018   Procedure: Revision ACDF C4-5, removal of hardware C5-6, exploration of fusion C5-6;  Surgeon: [name], MD;  Location: [facility];  Service: Orthopedics;  Laterality: N/A;  3.5 hrs  . NECK SURGERY     2 disc  . SPINAL FUSION      Family History  Problem Relation Age of Onset  . Diabetes Mother   . Hypertension Mother   . Diabetes Father   . Hypertension Father   . Arthritis Maternal Grandmother   . Hypertension Maternal Grandfather   . Diabetes Maternal Grandfather   . Colon cancer Neg Hx   . Pancreatitis Neg Hx   . Pancreatic cancer Neg Hx     Social History   Tobacco Use  . Smoking status: Current Every Day Smoker    Packs/day: 1.00    Types: Cigarettes  . Smokeless tobacco: Never Used  Substance Use Topics  . Alcohol use: Yes    Comment: every weekend  . Drug use: No      Current Outpatient Medications:  .  albuterol (PROVENTIL HFA;VENTOLIN HFA) 108 (90 Base) MCG/ACT inhaler, TAKE 2 PUFFS BY MOUTH EVERY 6 HOURS AS NEEDED FOR WHEEZE OR SHORTNESS OF BREATH, Disp: 6.7 Inhaler, Rfl: 1 .  amLODipine-benazepril (LOTREL)  10-40 MG capsule, Take 1 capsule by mouth daily., Disp: 30 capsule, Rfl: 2 .  atorvastatin (LIPITOR) 20 MG tablet, Take 1 tablet (20 mg total) by mouth daily., Disp: 90 tablet, Rfl: 0 .  citalopram (CELEXA) 20 MG tablet, TAKE 10 MG BY MOUTH PER DAY FOR 1 WEEK , THEN INCREASE TO 20 MG BY MOUTH PER DAY, Disp: 24 tablet, Rfl: 0 .  empagliflozin (JARDIANCE) 25 MG TABS tablet, Take 25 mg by mouth daily., Disp: 30 tablet, Rfl: 6 .  glimepiride (AMARYL) 4 MG tablet, TAKE 1.5 TABLETS (6 MG TOTAL) BY MOUTH DAILY BEFORE BREAKFAST., Disp: 45 tablet, Rfl: 2 .  glucose blood (ONE TOUCH TEST STRIPS) test strip, Use as instructed, Disp: 100 each, Rfl: 11 .  LORazepam (ATIVAN) 1 MG tablet, Take 1 tablet (1 mg total) by mouth every 8 (eight) hours as needed for anxiety. Panic attacks, Disp: 20 tablet, Rfl: 0 .  metFORMIN  (GLUCOPHAGE-XR) 500 MG 24 hr tablet, TAKE 2 TABLETS (1,000 MG TOTAL) BY MOUTH 2 (TWO) TIMES DAILY., Disp: 120 tablet, Rfl: 2 .  ondansetron (ZOFRAN ODT) 4 MG disintegrating tablet, Take 1 tablet (4 mg total) by mouth every 8 (eight) hours as needed for nausea or vomiting., Disp: 20 tablet, Rfl: 0 .  pantoprazole (PROTONIX) 40 MG tablet, TAKE 1 TABLET BY MOUTH TWICE A DAY (INS WILL ONLY PAY ONCE A DAY), Disp: 30 tablet, Rfl: 5  EXAM: BP Readings from Last 3 Encounters:  09/12/18 (!) 150/100  05/24/18 132/80  01/13/18 (!) 147/92   Wt Readings from Last 3 Encounters:  09/12/18 197 lb 9.6 oz (89.6 kg)  05/24/18 205 lb 11.2 oz (93.3 kg)  01/13/18 217 lb (98.4 kg)    Weight reported as 183?  VITALS per patient if applicable:  GENERAL: alert, oriented, appears well and in no acute distress HEENT: atraumatic, conjunttiva clear, no obvious abnormalities on inspection of external nose and ears NECK: normal movements of the head and neck LUNGS: on inspection no signs of respiratory distress, breathing rate appears normal, no obvious gross SOB, gasping or wheezing CV: no obvious cyanosis MS: moves all visible extremities without noticeable abnormality PSYCH/NEURO: pleasant and cooperative,, speech and thought processing grossly intact distressed  Lab Results  Component Value Date   WBC 6.6 01/05/2018   HGB 16.9 01/05/2018   HCT 49.2 01/05/2018   PLT 191 01/05/2018   GLUCOSE 251 (H) 07/19/2018   CHOL 313 (H) 07/19/2018   TRIG 96.0 07/19/2018   HDL 77.10 07/19/2018   LDLDIRECT 115.0 01/07/2016   LDLCALC 216 (H) 07/19/2018   ALT 66 (H) 07/19/2018   AST 39 (H) 07/19/2018   NA 139 07/19/2018   K 4.4 07/19/2018   CL 99 07/19/2018   CREATININE 0.93 07/19/2018   BUN 8 07/19/2018   CO2 31 07/19/2018   TSH 0.67 07/19/2018   PSA 0.76 01/07/2016   HGBA1C 8.3 (H) 07/19/2018   MICROALBUR 1.5 12/17/2016    ASSESSMENT AND PLAN:  Discussed the following assessment and plan:  Medication  management - Plan: Basic metabolic panel, CBC with Differential/Platelet, Hemoglobin A1c, Hepatic function panel, Lipid panel, Microalbumin / creatinine urine ratio, TSH  Diabetes mellitus type 2, uncontrolled, without complications ([place]) - Plan: Basic metabolic panel, CBC with Differential/Platelet, Hemoglobin A1c, Hepatic function panel, Lipid panel, Microalbumin / creatinine urine ratio, TSH  Tobacco dependence - Plan: Basic metabolic panel, CBC with Differential/Platelet, Hemoglobin A1c, Hepatic function panel, Lipid panel, Microalbumin / creatinine urine ratio, TSH  Essential hypertension, benign -  Plan: Basic metabolic panel, CBC with Differential/Platelet, Hemoglobin A1c, Hepatic function panel, Lipid panel, Microalbumin / creatinine urine ratio, TSH  S/P cervical spinal fusion - Plan: Basic metabolic panel, CBC with Differential/Platelet, Hemoglobin A1c, Hepatic function panel, Lipid panel, Microalbumin / creatinine urine ratio, TSH  Hx of acute pancreatitis - Plan: Basic metabolic panel, CBC with Differential/Platelet, Hemoglobin A1c, Hepatic function panel, Lipid panel, Microalbumin / creatinine urine ratio, TSH  Abnormal LFTs - Plan: Basic metabolic panel, CBC with Differential/Platelet, Hemoglobin A1c, Hepatic function panel, Lipid panel, Microalbumin / creatinine urine ratio, TSH  Heavy alcohol consumption - Plan: Basic metabolic panel, CBC with Differential/Platelet, Hemoglobin A1c, Hepatic function panel, Lipid panel, Microalbumin / creatinine urine ratio, TSH, Ambulatory referral to Psychiatry, Ambulatory referral to Psychology  Bereavement - Plan: Basic metabolic panel, CBC with Differential/Platelet, Hemoglobin A1c, Hepatic function panel, Lipid panel, Microalbumin / creatinine urine ratio, TSH, Ambulatory referral to Psychiatry, Ambulatory referral to Psychology  Weight loss - Plan: Basic metabolic panel, CBC with Differential/Platelet, Hemoglobin A1c, Hepatic function panel,  Lipid panel, Microalbumin / creatinine urine ratio, TSH  Reactive depression - Plan: Ambulatory referral to Psychiatry, Ambulatory referral to Psychology  multiple losses  In last 3 years  Father 2018 mom 2019 sis 2020  Became disabled with cervical spine disease  And surgery  Cannot work at his job .  Began  etoh again recently reason to stop  And how interferes with sleep and depression rxs.  Advise  psychiatry ? meds  and  Counseling  suspect  Dm is out of control  Also  As not paying that much attention to his own health  Counseled. Eye and foot exam   Expectant management and discussion of plan and treatment with opportunity to ask questions and all were answered. The patient agreed with the plan and demonstrated an understanding of the instructions.   Advised to call back or seek an in-person evaluation if worsening  or having  further concerns .     Shanon Ace, MD

## 2019-03-15 ENCOUNTER — Encounter: Payer: Self-pay | Admitting: Internal Medicine

## 2019-03-21 ENCOUNTER — Other Ambulatory Visit: Payer: Self-pay

## 2019-03-21 ENCOUNTER — Other Ambulatory Visit (INDEPENDENT_AMBULATORY_CARE_PROVIDER_SITE_OTHER): Payer: BC Managed Care – PPO

## 2019-03-21 DIAGNOSIS — IMO0001 Reserved for inherently not codable concepts without codable children: Secondary | ICD-10-CM

## 2019-03-21 DIAGNOSIS — R945 Abnormal results of liver function studies: Secondary | ICD-10-CM

## 2019-03-21 DIAGNOSIS — F17200 Nicotine dependence, unspecified, uncomplicated: Secondary | ICD-10-CM

## 2019-03-21 DIAGNOSIS — I10 Essential (primary) hypertension: Secondary | ICD-10-CM | POA: Diagnosis not present

## 2019-03-21 DIAGNOSIS — R634 Abnormal weight loss: Secondary | ICD-10-CM

## 2019-03-21 DIAGNOSIS — Z8719 Personal history of other diseases of the digestive system: Secondary | ICD-10-CM

## 2019-03-21 DIAGNOSIS — E1165 Type 2 diabetes mellitus with hyperglycemia: Secondary | ICD-10-CM

## 2019-03-21 DIAGNOSIS — Z789 Other specified health status: Secondary | ICD-10-CM

## 2019-03-21 DIAGNOSIS — Z79899 Other long term (current) drug therapy: Secondary | ICD-10-CM

## 2019-03-21 DIAGNOSIS — Z981 Arthrodesis status: Secondary | ICD-10-CM

## 2019-03-21 DIAGNOSIS — R7989 Other specified abnormal findings of blood chemistry: Secondary | ICD-10-CM

## 2019-03-21 DIAGNOSIS — F1090 Alcohol use, unspecified, uncomplicated: Secondary | ICD-10-CM

## 2019-03-21 DIAGNOSIS — Z634 Disappearance and death of family member: Secondary | ICD-10-CM

## 2019-03-21 LAB — CBC WITH DIFFERENTIAL/PLATELET
Basophils Absolute: 0.1 10*3/uL (ref 0.0–0.1)
Basophils Relative: 2.1 % (ref 0.0–3.0)
Eosinophils Absolute: 0.1 10*3/uL (ref 0.0–0.7)
Eosinophils Relative: 3.8 % (ref 0.0–5.0)
HCT: 44.6 % (ref 39.0–52.0)
Hemoglobin: 15.1 g/dL (ref 13.0–17.0)
Lymphocytes Relative: 37.5 % (ref 12.0–46.0)
Lymphs Abs: 1.2 10*3/uL (ref 0.7–4.0)
MCHC: 33.9 g/dL (ref 30.0–36.0)
MCV: 96.5 fl (ref 78.0–100.0)
Monocytes Absolute: 0.5 10*3/uL (ref 0.1–1.0)
Monocytes Relative: 15.8 % — ABNORMAL HIGH (ref 3.0–12.0)
Neutro Abs: 1.3 10*3/uL — ABNORMAL LOW (ref 1.4–7.7)
Neutrophils Relative %: 40.8 % — ABNORMAL LOW (ref 43.0–77.0)
Platelets: 259 10*3/uL (ref 150.0–400.0)
RBC: 4.62 Mil/uL (ref 4.22–5.81)
RDW: 13.1 % (ref 11.5–15.5)
WBC: 3.2 10*3/uL — ABNORMAL LOW (ref 4.0–10.5)

## 2019-03-21 LAB — BASIC METABOLIC PANEL
BUN: 13 mg/dL (ref 6–23)
CO2: 23 mEq/L (ref 19–32)
Calcium: 10 mg/dL (ref 8.4–10.5)
Chloride: 93 mEq/L — ABNORMAL LOW (ref 96–112)
Creatinine, Ser: 0.93 mg/dL (ref 0.40–1.50)
GFR: 106.32 mL/min (ref >60.00)
Glucose, Bld: 200 mg/dL — ABNORMAL HIGH (ref 70–99)
Potassium: 4.2 mEq/L (ref 3.5–5.1)
Sodium: 133 mEq/L — ABNORMAL LOW (ref 135–145)

## 2019-03-21 LAB — HEPATIC FUNCTION PANEL
ALT: 30 U/L (ref 0–53)
AST: 31 U/L (ref 0–37)
Albumin: 4.5 g/dL (ref 3.5–5.2)
Alkaline Phosphatase: 76 U/L (ref 39–117)
Bilirubin, Direct: 0.2 mg/dL (ref 0.0–0.3)
Total Bilirubin: 0.7 mg/dL (ref 0.2–1.2)
Total Protein: 7.3 g/dL (ref 6.0–8.3)

## 2019-03-21 LAB — MICROALBUMIN / CREATININE URINE RATIO
Creatinine,U: 66.5 mg/dL
Microalb Creat Ratio: 3.6 mg/g (ref 0.0–30.0)
Microalb, Ur: 2.4 mg/dL — ABNORMAL HIGH (ref 0.0–1.9)

## 2019-03-21 LAB — LDL CHOLESTEROL, DIRECT: Direct LDL: 193 mg/dL

## 2019-03-21 LAB — HEMOGLOBIN A1C: Hgb A1c MFr Bld: 9.5 % — ABNORMAL HIGH (ref 4.6–6.5)

## 2019-03-21 LAB — LIPID PANEL
Cholesterol: 275 mg/dL — ABNORMAL HIGH (ref 0–200)
HDL: 35.3 mg/dL — ABNORMAL LOW (ref >39.00)
NonHDL: 239.97
Total CHOL/HDL Ratio: 8
Triglycerides: 386 mg/dL — ABNORMAL HIGH (ref 0.0–149.0)
VLDL: 77.2 mg/dL — ABNORMAL HIGH (ref 0.0–40.0)

## 2019-03-21 LAB — TSH: TSH: 1.03 u[IU]/mL (ref 0.35–4.50)

## 2019-03-23 MED ORDER — ATORVASTATIN CALCIUM 40 MG PO TABS: 40.0000 mg | ORAL_TABLET | Freq: Every day | ORAL | 1 refills | Status: DC

## 2019-03-27 ENCOUNTER — Ambulatory Visit: Payer: Self-pay | Admitting: Internal Medicine

## 2019-03-27 NOTE — Telephone Encounter (Signed)
Pt scheduled for virtual visit with PCP.

## 2019-03-27 NOTE — Telephone Encounter (Signed)
Summary: right arm and hand swollen.    Pt called and stated that his right arm and hand are swollen. Pt would like to know what he should do. Please advise          Returned call to pt regarding the above message. He stated that the swelling is in his right hand only. He it is painful when he is trying to use that hand and he is left handed.Todd Rodriguez He stated this started on Saturday after eating a baconater sandwich,  from [facility] and some potted meat. And after he had laid down to take a nap. This is the only part of his body that he has noticed swelling. He did have pain in his right shoulder on Saturday and used ice and heat to it but not his hand. No pain in his shoulder today. Advised to take his b/p because sometimes you can have problems that are cardiac related to affect parts of the body.  Pt voiced understanding.  Notified LB at [facility] for an appointment. Routing to the practice. Reason for Disposition . MODERATE hand swelling (e.g., visible swelling of hand and fingers; pitting edema)  Answer Assessment - Initial Assessment Questions 1. ONSET: "When did the swelling start?" (e.g., minutes, hours, days)     Saturday 2. LOCATION: "What part of the hand is swollen?"  "Are both hands swollen or just one hand?"    Right hand and wrist 3. SEVERITY: "How bad is the swelling?" (e.g., localized; mild, moderate, severe)   - BALL OR LUMP: small ball or lump   - LOCALIZED: puffy or swollen area or patch of skin   - JOINT SWELLING: swelling of a joint   - MILD: puffiness or mild swelling of fingers or hand   - MODERATE: fingers and hand are swollen   - SEVERE: swelling of entire hand and up into forearm     moderate 4. REDNESS: "Does the swelling look red or infected?"     no 5. PAIN: "Is the swelling painful to touch?" If so, ask: "How painful is it?"   (Scale 1-10; mild, moderate or severe)     Pain # 5 6. FEVER: "Do you have a fever?" If so, ask: "What is it, how was it measured,  and when did it start?"      no 7. CAUSE: "What do you think is causing the hand swelling?" (e.g., heat, insect bite, pregnancy, recent injury)     Possibly but bite 8. MEDICAL HISTORY: "Do you have a history of heart failure, kidney disease, liver failure, or cancer?"     no 9. RECURRENT SYMPTOM: "Have you had hand swelling before?" If so, ask: "When was the last time?" "What happened that time?"     no 10. OTHER SYMPTOMS: "Do you have any other symptoms?" (e.g., blurred vision, difficulty breathing, headache)       no 11. PREGNANCY: "Is there any chance you are pregnant?" "When was your last menstrual period?"       N/a  Protocols used: HAND [facility]

## 2019-03-27 NOTE — Progress Notes (Signed)
Virtual Visit via Video Note  I connected with@ on 03/28/19 at 10:00 AM EDT by a video enabled telemedicine application and verified that I am speaking with the correct person using two identifiers. Location patient: home Location provider:work  office Persons participating in the virtual visit: patient, provider  [place] national recommendations  regarding COVID 19 pandemic   video visit is advised over in office visit for this patient.  Patient aware  of the limitations of evaluation and management by telemedicine and  availability of in person appointments. and agreed to proceed.   HPI: Todd Rodriguez presents for video visit 2 days ago awoke from a nap in a desk chair and noted right hand  Wrist . very swollen and stiff and hard to use  . No trauma redness fever  Since that time a bit better but still swollen    No new sx  Feels stiff to grip and use . No other trauma. No true new weakness   ROS: See pertinent positives and negatives per HPI.  Past Medical History:  Diagnosis Date  . Acute pancreatitis 10/25/2013  . Arthritis   . Diabetes mellitus without complication ([place])   . GERD (gastroesophageal reflux disease)   . Hypertension   . Myalgia and myositis 04/21/2014   much better from illness presumed infectious    has disability form  will complete    . Pancreatitis     Past Surgical History:  Procedure Laterality Date  . ANTERIOR CERVICAL DECOMP/DISCECTOMY FUSION N/A 01/12/2018   Procedure: Revision ACDF C4-5, removal of hardware C5-6, exploration of fusion C5-6;  Surgeon: [name], MD;  Location: [facility];  Service: Orthopedics;  Laterality: N/A;  3.5 hrs  . NECK SURGERY     2 disc  . SPINAL FUSION      Family History  Problem Relation Age of Onset  . Diabetes Mother   . Hypertension Mother   . Diabetes Father   . Hypertension Father   . Arthritis Maternal Grandmother   . Hypertension Maternal Grandfather   . Diabetes Maternal Grandfather   . Colon cancer Neg Hx    . Pancreatitis Neg Hx   . Pancreatic cancer Neg Hx     Social History   Tobacco Use  . Smoking status: Current Every Day Smoker    Packs/day: 1.00    Types: Cigarettes  . Smokeless tobacco: Never Used  Substance Use Topics  . Alcohol use: Yes    Comment: every weekend  . Drug use: No      Current Outpatient Medications:  .  albuterol (PROVENTIL HFA;VENTOLIN HFA) 108 (90 Base) MCG/ACT inhaler, TAKE 2 PUFFS BY MOUTH EVERY 6 HOURS AS NEEDED FOR WHEEZE OR SHORTNESS OF BREATH, Disp: 6.7 Inhaler, Rfl: 1 .  amLODipine-benazepril (LOTREL) 10-40 MG capsule, Take 1 capsule by mouth daily., Disp: 30 capsule, Rfl: 2 .  atorvastatin (LIPITOR) 20 MG tablet, Take 1 tablet (20 mg total) by mouth daily., Disp: 90 tablet, Rfl: 0 .  atorvastatin (LIPITOR) 40 MG tablet, Take 1 tablet (40 mg total) by mouth daily., Disp: 90 tablet, Rfl: 1 .  citalopram (CELEXA) 20 MG tablet, TAKE 10 MG BY MOUTH PER DAY FOR 1 WEEK , THEN INCREASE TO 20 MG BY MOUTH PER DAY, Disp: 24 tablet, Rfl: 0 .  empagliflozin (JARDIANCE) 25 MG TABS tablet, Take 25 mg by mouth daily., Disp: 30 tablet, Rfl: 6 .  glimepiride (AMARYL) 4 MG tablet, TAKE 1.5 TABLETS (6 MG TOTAL) BY MOUTH DAILY BEFORE BREAKFAST.,  Disp: 45 tablet, Rfl: 2 .  glucose blood (ONE TOUCH TEST STRIPS) test strip, Use as instructed, Disp: 100 each, Rfl: 11 .  LORazepam (ATIVAN) 1 MG tablet, Take 1 tablet (1 mg total) by mouth every 8 (eight) hours as needed for anxiety. Panic attacks, Disp: 20 tablet, Rfl: 0 .  metFORMIN (GLUCOPHAGE-XR) 500 MG 24 hr tablet, TAKE 2 TABLETS (1,000 MG TOTAL) BY MOUTH 2 (TWO) TIMES DAILY., Disp: 120 tablet, Rfl: 2 .  ondansetron (ZOFRAN ODT) 4 MG disintegrating tablet, Take 1 tablet (4 mg total) by mouth every 8 (eight) hours as needed for nausea or vomiting., Disp: 20 tablet, Rfl: 0 .  pantoprazole (PROTONIX) 40 MG tablet, TAKE 1 TABLET BY MOUTH TWICE A DAY (INS WILL ONLY PAY ONCE A DAY), Disp: 30 tablet, Rfl: 5  EXAM: BP Readings  from Last 3 Encounters:  09/12/18 (!) 150/100  05/24/18 132/80  01/13/18 (!) 147/92    VITALS per patient if applicable:  GENERAL: alert, oriented, appears well and in no acute distress  HEENT: atraumatic, conjunttiva clear, no obvious abnormalities on inspection of external nose and ears NECK: normal movements of the head and neck LUNGS: on inspection no signs of respiratory distress, breathing rate appears normal, no obvious gross SOB, gasping or wheezing  CV: no obvious cyanosis MS: moves all visible extremities  Right hand srist is puffy but nl color rest of arm looks nl and no edema  Can move all fingers  And rom ok but feels stiff   Can do nl rom of shoulder   PSYCH/NEURO: pleasant and cooperative, Lab Results  Component Value Date   WBC 3.2 (L) 03/21/2019   HGB 15.1 03/21/2019   HCT 44.6 03/21/2019   PLT 259.0 03/21/2019   GLUCOSE 200 (H) 03/21/2019   CHOL 275 (H) 03/21/2019   TRIG 386.0 (H) 03/21/2019   HDL 35.30 (L) 03/21/2019   LDLDIRECT 193.0 03/21/2019   LDLCALC 216 (H) 07/19/2018   ALT 30 03/21/2019   AST 31 03/21/2019   NA 133 (L) 03/21/2019   K 4.2 03/21/2019   CL 93 (L) 03/21/2019   CREATININE 0.93 03/21/2019   BUN 13 03/21/2019   CO2 23 03/21/2019   TSH 1.03 03/21/2019   PSA 0.76 01/07/2016   HGBA1C 9.5 (H) 03/21/2019   MICROALBUR 2.4 (H) 03/21/2019    ASSESSMENT AND PLAN:  Discussed the following assessment and plan:    ICD-10-CM   1. Swelling of right hand  M79.89   2. Diabetes mellitus type 2, uncontrolled, without complications ([place])  Y40.34 Ambulatory referral to Endocrinology  3. Hx of acute pancreatitis  Z87.19 Ambulatory referral to Endocrinology   Has appt with   Counselling   This week.     Never offered  endocrine appt that he remembers and willing to go .  He says not eating right recently   will refer     suspect the hand swelling is related to compression   Event positional but should get better quicklu with elevation  And position   And if not a lot better in the next 2 -3 days contact us for advice   He denies  Injury but has  Used more etoh recently .  Counseled.  Encouraged to keep appt with counselor   Expectant management and discussion of plan and treatment with opportunity to ask questions and all were answered. The patient agreed with the plan and demonstrated an understanding of the instructions.   Advised to call back or seek  an in-person evaluation if worsening  or having  further concerns . In interim and if not better by weekend   Avoid compression positions    Shanon Ace, MD

## 2019-03-28 ENCOUNTER — Encounter: Payer: Self-pay | Admitting: Internal Medicine

## 2019-03-28 ENCOUNTER — Other Ambulatory Visit: Payer: Self-pay

## 2019-03-28 ENCOUNTER — Ambulatory Visit (INDEPENDENT_AMBULATORY_CARE_PROVIDER_SITE_OTHER): Payer: BC Managed Care – PPO | Admitting: Internal Medicine

## 2019-03-28 DIAGNOSIS — IMO0001 Reserved for inherently not codable concepts without codable children: Secondary | ICD-10-CM

## 2019-03-28 DIAGNOSIS — E1165 Type 2 diabetes mellitus with hyperglycemia: Secondary | ICD-10-CM | POA: Diagnosis not present

## 2019-03-28 DIAGNOSIS — M7989 Other specified soft tissue disorders: Secondary | ICD-10-CM | POA: Diagnosis not present

## 2019-03-28 DIAGNOSIS — Z8719 Personal history of other diseases of the digestive system: Secondary | ICD-10-CM

## 2019-03-30 ENCOUNTER — Ambulatory Visit (INDEPENDENT_AMBULATORY_CARE_PROVIDER_SITE_OTHER): Payer: BC Managed Care – PPO | Admitting: Psychology

## 2019-03-30 DIAGNOSIS — F4321 Adjustment disorder with depressed mood: Secondary | ICD-10-CM | POA: Diagnosis not present

## 2019-04-12 ENCOUNTER — Other Ambulatory Visit: Payer: Self-pay | Admitting: Internal Medicine

## 2019-04-13 ENCOUNTER — Ambulatory Visit: Payer: Self-pay | Admitting: Psychology

## 2019-04-14 ENCOUNTER — Ambulatory Visit: Payer: BC Managed Care – PPO | Admitting: Psychology

## 2019-04-17 ENCOUNTER — Other Ambulatory Visit: Payer: Self-pay

## 2019-04-17 ENCOUNTER — Ambulatory Visit (INDEPENDENT_AMBULATORY_CARE_PROVIDER_SITE_OTHER): Payer: BC Managed Care – PPO | Admitting: Internal Medicine

## 2019-04-17 ENCOUNTER — Encounter: Payer: Self-pay | Admitting: Internal Medicine

## 2019-04-17 DIAGNOSIS — R200 Anesthesia of skin: Secondary | ICD-10-CM

## 2019-04-17 DIAGNOSIS — R202 Paresthesia of skin: Secondary | ICD-10-CM

## 2019-04-17 DIAGNOSIS — Z981 Arthrodesis status: Secondary | ICD-10-CM | POA: Diagnosis not present

## 2019-04-17 DIAGNOSIS — M7989 Other specified soft tissue disorders: Secondary | ICD-10-CM

## 2019-04-17 DIAGNOSIS — IMO0002 Reserved for concepts with insufficient information to code with codable children: Secondary | ICD-10-CM

## 2019-04-17 DIAGNOSIS — R229 Localized swelling, mass and lump, unspecified: Secondary | ICD-10-CM | POA: Diagnosis not present

## 2019-04-17 MED ORDER — DOXYCYCLINE HYCLATE 100 MG PO TABS: 100.0000 mg | ORAL_TABLET | Freq: Two times a day (BID) | ORAL | 0 refills | Status: DC

## 2019-04-17 NOTE — Progress Notes (Signed)
Virtual Visit via Video Note  I connected with@ on 04/17/19 at  3:00 PM EDT by a video enabled telemedicine application and verified that I am speaking with the correct person using two identifiers. Location patient: home Location provider:work  office Persons participating in the virtual visit: patient, provider  [place] national recommendations  regarding COVID 19 pandemic   video visit is advised over in office visit for this patient.  Patient aware  of the limitations of evaluation and management by telemedicine and  availability of in person appointments. and agreed to proceed.   HPI: Todd Rodriguez presents for video visit  A couple of issues  Had something  on face ? If tick right side and swatted off and had bleed ing at the area and then felt badly and now feels nodule under chin jaw line at that are  And worried this could have been a tick bite  No fever currently or other rashes      Right hand  numb :  And dr Nelva Bush advised he see  Neurologist about this    DM :  Had been only taking 1 bid metformin but now back to 2 bid    Has disability hearing this week.    Was feeling bad and concerned he may have needed to go to ED   So called and cancelled counseling appt but then  Came as a no show  when want to reschedule     ROS: See pertinent positives and negatives per HPI. No cp sob   Falling  Rash fever  Cough  appetiet is some better than last time  Past Medical History:  Diagnosis Date  . Acute pancreatitis 10/25/2013  . Arthritis   . Diabetes mellitus without complication ([place])   . GERD (gastroesophageal reflux disease)   . Hypertension   . Myalgia and myositis 04/21/2014   much better from illness presumed infectious    has disability form  will complete    . Pancreatitis     Past Surgical History:  Procedure Laterality Date  . ANTERIOR CERVICAL DECOMP/DISCECTOMY FUSION N/A 01/12/2018   Procedure: Revision ACDF C4-5, removal of hardware C5-6, exploration of  fusion C5-6;  Surgeon: [name], MD;  Location: [place];  Service: Orthopedics;  Laterality: N/A;  3.5 hrs  . NECK SURGERY     2 disc  . SPINAL FUSION      Family History  Problem Relation Age of Onset  . Diabetes Mother   . Hypertension Mother   . Diabetes Father   . Hypertension Father   . Arthritis Maternal Grandmother   . Hypertension Maternal Grandfather   . Diabetes Maternal Grandfather   . Colon cancer Neg Hx   . Pancreatitis Neg Hx   . Pancreatic cancer Neg Hx     Social History   Tobacco Use  . Smoking status: Current Every Day Smoker    Packs/day: 1.00    Types: Cigarettes  . Smokeless tobacco: Never Used  Substance Use Topics  . Alcohol use: Yes    Comment: every weekend  . Drug use: No      Current Outpatient Medications:  .  albuterol (PROVENTIL HFA;VENTOLIN HFA) 108 (90 Base) MCG/ACT inhaler, TAKE 2 PUFFS BY MOUTH EVERY 6 HOURS AS NEEDED FOR WHEEZE OR SHORTNESS OF BREATH, Disp: 6.7 Inhaler, Rfl: 1 .  amLODipine-benazepril (LOTREL) 10-40 MG capsule, TAKE 1 CAPSULE BY MOUTH EVERY DAY, Disp: 30 capsule, Rfl: 2 .  atorvastatin (LIPITOR) 40 MG tablet, Take  1 tablet (40 mg total) by mouth daily., Disp: 90 tablet, Rfl: 1 .  citalopram (CELEXA) 20 MG tablet, TAKE 10 MG BY MOUTH PER DAY FOR 1 WEEK , THEN INCREASE TO 20 MG BY MOUTH PER DAY, Disp: 24 tablet, Rfl: 0 .  empagliflozin (JARDIANCE) 25 MG TABS tablet, Take 25 mg by mouth daily., Disp: 30 tablet, Rfl: 6 .  gabapentin (NEURONTIN) 300 MG capsule, , Disp: , Rfl:  .  glimepiride (AMARYL) 4 MG tablet, TAKE 1.5 TABLETS (6 MG TOTAL) BY MOUTH DAILY BEFORE BREAKFAST., Disp: 45 tablet, Rfl: 2 .  glucose blood (ONE TOUCH TEST STRIPS) test strip, Use as instructed, Disp: 100 each, Rfl: 11 .  LORazepam (ATIVAN) 1 MG tablet, Take 1 tablet (1 mg total) by mouth every 8 (eight) hours as needed for anxiety. Panic attacks, Disp: 20 tablet, Rfl: 0 .  metFORMIN (GLUCOPHAGE-XR) 500 MG 24 hr tablet, TAKE 2 TABLETS (1,000 MG  TOTAL) BY MOUTH 2 (TWO) TIMES DAILY., Disp: 120 tablet, Rfl: 2 .  pantoprazole (PROTONIX) 40 MG tablet, TAKE 1 TABLET BY MOUTH TWICE A DAY (INS WILL ONLY PAY ONCE A DAY), Disp: 30 tablet, Rfl: 5 .  doxycycline (VIBRA-TABS) 100 MG tablet, Take 1 tablet (100 mg total) by mouth 2 (two) times daily., Disp: 14 tablet, Rfl: 0 .  ondansetron (ZOFRAN ODT) 4 MG disintegrating tablet, Take 1 tablet (4 mg total) by mouth every 8 (eight) hours as needed for nausea or vomiting., Disp: 20 tablet, Rfl: 0  EXAM: BP Readings from Last 3 Encounters:  09/12/18 (!) 150/100  05/24/18 132/80  01/13/18 (!) 147/92    VITALS per patient if applicable:  GENERAL: alert, oriented, appears well and in no acute distress worried appearing   HEENT: atraumatic, conjunttiva clear, no obvious abnormalities on inspection of external nose and ears  NECK:no ob masses   Points to right sub mandibular area of nodule  About nickel size no obv rash   LUNGS: on inspection no signs of respiratory distress, breathing rate appears normal, no obvious gross SOB, gasping or wheezing  CV: no obvious cyanosis  PSYCH/NEURO: pleasant and cooperative, , speech and thought processing grossly intact   ASSESSMENT AND PLAN:  Discussed the following assessment and plan:    ICD-10-CM   1. Lump poss reactive adenopathy  R22.9    poss from insect bite pt worried   2. S/P cervical spinal fusion  Z98.1 Ambulatory referral to Neurology  3. Numbness and tingling in right hand  R20.0 Ambulatory referral to Neurology   R20.2   4. Swelling of right hand  M79.89    dont think  tick fever but some adenopathy reactive can add antibiotic 5-7 days  Call behavior health to tell them  You called but still came as a no show .  Will do referral  neurology referral as requested   And dr Nelva Bush suggested  . Uncertain how much takein dec metformin  added to sugar control nd now taking  Correct dose . He Counseled.   Expectant management and discussion  of plan and treatment with opportunity to ask questions and all were answered. The patient agreed with the plan and demonstrated an understanding of the instructions.   Advised to call back or seek an in-person evaluation if worsening  or having  further concerns .   Shanon Ace, MD

## 2019-04-17 NOTE — Patient Instructions (Addendum)
Health Maintenance Due  Topic Date Due  . OPHTHALMOLOGY EXAM  03/22/1984  . HIV Screening  03/22/1989  . FOOT EXAM  12/17/2017  . TETANUS/TDAP 11/05/2018    Depression screen PHQ 2/9 04/17/2019 01/25/2015  Decreased Interest 0 0  Down, Depressed, Hopeless 0 0  PHQ - 2 Score 0 0

## 2019-05-03 ENCOUNTER — Other Ambulatory Visit: Payer: Self-pay

## 2019-05-03 ENCOUNTER — Emergency Department (HOSPITAL_COMMUNITY)
Admission: EM | Admit: 2019-05-03 | Discharge: 2019-05-03 | Disposition: A | Discharge status: Home / Self Care | Payer: BC Managed Care – PPO | Attending: Emergency Medicine | Admitting: Emergency Medicine

## 2019-05-03 ENCOUNTER — Encounter (HOSPITAL_COMMUNITY): Payer: Self-pay | Admitting: *Deleted

## 2019-05-03 DIAGNOSIS — R5383 Other fatigue: Secondary | ICD-10-CM | POA: Diagnosis present

## 2019-05-03 DIAGNOSIS — Z5321 Procedure and treatment not carried out due to patient leaving prior to being seen by health care provider: Secondary | ICD-10-CM | POA: Insufficient documentation

## 2019-05-03 LAB — CBG MONITORING, ED: Glucose-Capillary: 110 mg/dL — ABNORMAL HIGH (ref 70–99)

## 2019-05-03 NOTE — ED Notes (Signed)
Pt was informed of wait time and pt refused to wait.  Pt did inform triage of leaving.

## 2019-05-03 NOTE — ED Triage Notes (Signed)
Pt with tick bite 2 weeks ago.

## 2019-05-03 NOTE — ED Triage Notes (Signed)
Pt with fatigue and generalized weakness for couple of weeks. Denies pain or fever.

## 2019-05-04 ENCOUNTER — Other Ambulatory Visit: Payer: Self-pay | Admitting: Internal Medicine

## 2019-05-05 ENCOUNTER — Other Ambulatory Visit: Payer: Self-pay

## 2019-05-10 ENCOUNTER — Encounter: Payer: Self-pay | Admitting: Internal Medicine

## 2019-05-10 ENCOUNTER — Other Ambulatory Visit: Payer: Self-pay

## 2019-05-10 ENCOUNTER — Ambulatory Visit: Payer: BC Managed Care – PPO | Admitting: Internal Medicine

## 2019-05-10 VITALS — BP 158/102 | HR 97 | Temp 98.3°F | Ht 74.0 in | Wt 184.6 lb

## 2019-05-10 DIAGNOSIS — E1165 Type 2 diabetes mellitus with hyperglycemia: Secondary | ICD-10-CM

## 2019-05-10 DIAGNOSIS — E785 Hyperlipidemia, unspecified: Secondary | ICD-10-CM | POA: Diagnosis not present

## 2019-05-10 DIAGNOSIS — IMO0001 Reserved for inherently not codable concepts without codable children: Secondary | ICD-10-CM

## 2019-05-10 LAB — GLUCOSE, POCT (MANUAL RESULT ENTRY): POC Glucose: 179 mg/dl — AB (ref 70–99)

## 2019-05-10 MED ORDER — EMPAGLIFLOZIN 25 MG PO TABS: 25.0000 mg | ORAL_TABLET | Freq: Every day | ORAL | 6 refills | Status: DC

## 2019-05-10 MED ORDER — GLIPIZIDE 5 MG PO TABS: 5.0000 mg | ORAL_TABLET | Freq: Two times a day (BID) | ORAL | 3 refills | Status: DC

## 2019-05-10 MED ORDER — METFORMIN HCL 1000 MG PO TABS: 1000.0000 mg | ORAL_TABLET | Freq: Two times a day (BID) | ORAL | 3 refills | Status: DC

## 2019-05-10 NOTE — Patient Instructions (Signed)
-   Stop Glimepiride  - Start Glipizide 5 mg, 1 tablet before breakfast and Supper  - Continue Metformin 1000 mg, 1 tablet twice a day  - Continue Jardiance 25 mg daily     - Check sugar before Breakfast and supper , if your sugars are consistently over 200 , please give [place] a call      HOW TO TREAT LOW BLOOD SUGARS (Blood sugar LESS THAN 70 MG/DL)  Please follow the RULE OF 15 for the treatment of hypoglycemia treatment (when your (blood sugars are less than 70 mg/dL)    STEP 1: Take 15 grams of carbohydrates when your blood sugar is low, which includes:   3-4 GLUCOSE TABS  OR  3-4 OZ OF JUICE OR REGULAR SODA OR  ONE TUBE OF GLUCOSE GEL     STEP 2: RECHECK blood sugar in 15 MINUTES STEP 3: If your blood sugar is still low at the 15 minute recheck --> then, go back to STEP 1 and treat AGAIN with another 15 grams of carbohydrates.

## 2019-05-10 NOTE — Progress Notes (Signed)
Name: Todd Rodriguez  MRN/ DOB: [id number], 1976-06-12   Age/ Sex: 43 y.o., male    PCP: Regis Bill Standley Brooking, MD   Reason for Endocrinology Evaluation: Type 2 Diabetes Mellitus     Date of Initial Endocrinology Visit: 05/10/2019     PATIENT IDENTIFIER: [name] is a 43 y.o. male with a past medical history of T2DM , Hx of alcohol abuse, depression. The patient presented for initial endocrinology clinic visit on 05/10/2019 for consultative assistance with his diabetes management.    HPI: Mr. Moll was    Diagnosed with T2DM does not recall  Prior Medications tried/Intolerance: n/a Currently checking blood sugars sometimes  Hypoglycemia episodes : no               Hemoglobin A1c has ranged from 7.3% in 2015, peaking at 12.2% in 2017. Patient required assistance for hypoglycemia: no Patient has required hospitalization within the last 1 year from hyper or hypoglycemia: no  In terms of diet, the patient eats 2 meals a day , snacks 1 a day, the pt drinks sugar-sweetened beverages.    HOME DIABETES REGIMEN: jardiance 25 mg daily  Glimepiride 4 mg 1 tab daily  Metformin 1000 mg 2 tabs BID    Statin: yes ACE-I/ARB: yes Prior Diabetic Education: yes   METER DOWNLOAD SUMMARY: Did not bring     DIABETIC COMPLICATIONS: Microvascular complications:    Denies: CKD, neuropathy  Last eye exam: Completed 03/2019  Macrovascular complications:    Denies: CAD, PVD, CVA   PAST HISTORY: Past Medical History:  Past Medical History:  Diagnosis Date  . Acute pancreatitis 10/25/2013  . Arthritis   . Diabetes mellitus without complication ([place])   . GERD (gastroesophageal reflux disease)   . Hypertension   . Myalgia and myositis 04/21/2014   much better from illness presumed infectious    has disability form  will complete    . Pancreatitis    Past Surgical History:  Past Surgical History:  Procedure Laterality Date  . ANTERIOR CERVICAL DECOMP/DISCECTOMY FUSION N/A  01/12/2018   Procedure: Revision ACDF C4-5, removal of hardware C5-6, exploration of fusion C5-6;  Surgeon: [name], MD;  Location: [place];  Service: Orthopedics;  Laterality: N/A;  3.5 hrs  . NECK SURGERY     2 disc  . SPINAL FUSION        Social History:  reports that he has been smoking cigarettes. He has been smoking about 1.00 pack per day. He has never used smokeless tobacco. He reports current alcohol use. He reports that he does not use drugs. Family History:  Family History  Problem Relation Age of Onset  . Diabetes Mother   . Hypertension Mother   . Diabetes Father   . Hypertension Father   . Arthritis Maternal Grandmother   . Hypertension Maternal Grandfather   . Diabetes Maternal Grandfather   . Colon cancer Neg Hx   . Pancreatitis Neg Hx   . Pancreatic cancer Neg Hx      HOME MEDICATIONS: Allergies as of 05/10/2019   No Known Allergies     Medication List       Accurate as of May 10, 2019  3:22 PM. If you have any questions, ask your nurse or doctor.        STOP taking these medications   doxycycline 100 MG tablet Commonly known as: VIBRA-TABS Stopped by: Dorita Sciara, MD   metFORMIN 500 MG 24 hr tablet Commonly known as: GLUCOPHAGE-XR Stopped  by: Dorita Sciara, MD     TAKE these medications   albuterol 108 (90 Base) MCG/ACT inhaler Commonly known as: VENTOLIN HFA TAKE 2 PUFFS BY MOUTH EVERY 6 HOURS AS NEEDED FOR WHEEZE OR SHORTNESS OF BREATH   amLODipine-benazepril 10-40 MG capsule Commonly known as: LOTREL TAKE 1 CAPSULE BY MOUTH EVERY DAY   atorvastatin 40 MG tablet Commonly known as: LIPITOR Take 1 tablet (40 mg total) by mouth daily.   citalopram 20 MG tablet Commonly known as: CELEXA TAKE 10 MG BY MOUTH PER DAY FOR 1 WEEK , THEN INCREASE TO 20 MG BY MOUTH PER DAY   empagliflozin 25 MG Tabs tablet Commonly known as: JARDIANCE Take 25 mg by mouth daily.   gabapentin 300 MG capsule Commonly known as: NEURONTIN    glimepiride 4 MG tablet Commonly known as: AMARYL TAKE 1.5 TABLETS (6 MG TOTAL) BY MOUTH DAILY BEFORE BREAKFAST.   glucose blood test strip Commonly known as: ONE TOUCH TEST STRIPS Use as instructed   LORazepam 1 MG tablet Commonly known as: Ativan Take 1 tablet (1 mg total) by mouth every 8 (eight) hours as needed for anxiety. Panic attacks   ondansetron 4 MG disintegrating tablet Commonly known as: Zofran ODT Take 1 tablet (4 mg total) by mouth every 8 (eight) hours as needed for nausea or vomiting.   pantoprazole 40 MG tablet Commonly known as: PROTONIX TAKE 1 TABLET BY MOUTH TWICE A DAY (INS WILL ONLY PAY ONCE A DAY)        ALLERGIES: No Known Allergies   REVIEW OF SYSTEMS: A comprehensive ROS was conducted with the patient and is negative except as per HPI and below:  Review of Systems  Constitutional: Negative for chills and fever.  HENT: Negative for congestion and sore throat.   Eyes: Negative for blurred vision and pain.  Respiratory: Negative for cough and shortness of breath.   Cardiovascular: Negative for chest pain and palpitations.  Gastrointestinal: Negative for diarrhea and nausea.  Genitourinary: Negative for frequency.  Musculoskeletal: Positive for neck pain.  Neurological: Positive for tingling.       Hand  Endo/Heme/Allergies: Negative for polydipsia.  Psychiatric/Behavioral: Negative for depression. The patient is nervous/anxious.       OBJECTIVE:   VITAL SIGNS: BP (!) 158/102 (BP Location: Left Arm, Patient Position: Sitting, Cuff Size: Normal)   Pulse 97   Temp 98.3 F (36.8 C)   Ht 6' 2"  (1.88 m)   Wt 184 lb 9.6 oz (83.7 kg)   SpO2 98%   BMI 23.70 kg/m    PHYSICAL EXAM:  General: Pt appears well and is in NAD  Hydration: Well-hydrated with moist mucous membranes and good skin turgor  HEENT: Head: Unremarkable with good dentition. Oropharynx clear without exudate.  Eyes: External eye exam normal without stare, lid lag or  exophthalmos.  EOM intact.  PERRL.  Neck: General: Supple without adenopathy or carotid bruits. Thyroid: Thyroid size normal.  No goiter or nodules appreciated. No thyroid bruit.  Lungs: Clear with good BS bilat with no rales, rhonchi, or wheezes  Heart: RRR with normal S1 and S2 and no gallops; no murmurs; no rub  Abdomen: Normoactive bowel sounds, soft, nontender, without masses or organomegaly palpable  Extremities:  Lower extremities - No pretibial edema. No lesions.  Skin: Normal texture and temperature to palpation. No Rodriguez noted. No Acanthosis nigricans/skin tags. No lipohypertrophy.  Neuro: MS is good with appropriate affect, pt is alert and Ox3    DM foot  exam: 05/10/2019  The skin of the feet is intact without sores or ulcerations, toe nails are thickened and curved The pedal pulses are 2+ on right and 2+ on left. The sensation is decreased to a screening 5.07, 10 gram monofilament bilaterally   DATA REVIEWED:  Lab Results  Component Value Date   HGBA1C 9.5 (H) 03/21/2019   HGBA1C 8.3 (H) 07/19/2018   HGBA1C 7.3 01/03/2018   Lab Results  Component Value Date   MICROALBUR 2.4 (H) 03/21/2019   LDLCALC 216 (H) 07/19/2018   CREATININE 0.93 03/21/2019   Lab Results  Component Value Date   MICRALBCREAT 3.6 03/21/2019    Lab Results  Component Value Date   CHOL 275 (H) 03/21/2019   HDL 35.30 (L) 03/21/2019   LDLCALC 216 (H) 07/19/2018   LDLDIRECT 193.0 03/21/2019   TRIG 386.0 (H) 03/21/2019   CHOLHDL 8 03/21/2019       In-Office Bg 179 mg/dL     ASSESSMENT / PLAN / RECOMMENDATIONS:   1) Type 2 Diabetes Mellitus, Poorly controlled, Without complications - Most recent A1c of 9.5 %. Goal A1c < 7.0 %.    Plan: GENERAL: I have discussed with the patient the pathophysiology of diabetes. We went over the natural progression of the disease. We talked about both insulin resistance and insulin deficiency. We stressed the importance of lifestyle changes including diet  and exercise. I explained the complications associated with diabetes including retinopathy, nephropathy, neuropathy as well as increased risk of cardiovascular disease. We went over the benefit seen with glycemic control.    I explained to the patient that diabetic patients are at higher than normal risk for amputations.  Discussed the importance of lifestyle changes in diabetes control, discussed the effects of alcohol on pancreas and glucose control. Pt advised to avoid sugar-sweetened beverages and avoid snacks if possible but to eat 3 consistent meals a day, low CHO options for snacks discussed.   Will stop Glimepiride and start Glipizide, I have advised him to take it with meals.   We also discussed risk of genital infections with SGLT-2 inhibitors.  Pt advised to contact us with BG > 200 to increase Glipizide.    MEDICATIONS: - Stop Glimepiride  - Start Glipizide 5 mg, 1 tablet before breakfast and Supper  - Continue Metformin 1000 mg, 1 tablet twice a day  - Continue Jardiance 25 mg daily    EDUCATION / INSTRUCTIONS:  BG monitoring instructions: Patient is instructed to check his blood sugars 2 times a day, before breakfast and supper  Call [facility] if: BG persistently < 70 or > 300. . I reviewed the Rule of 15 for the treatment of hypoglycemia in detail with the patient. Literature supplied.   2) Diabetic complications:   Eye: Does not have known diabetic retinopathy.   Neuro/ Feet: Does not have known diabetic peripheral neuropathy.  Renal: Patient does not have known baseline CKD. He is on an ACEI/ARB at present.   3) Lipids: Patient is on a statin. LDL high at 193 mg/dL but this is most likely due to non-compliance. Discussed cardiovascular benefits of statins.    4) Hypertension: He is above goal of < 140/90 mmHg. Pt asymptomatic.     F/u in 3 months   Signed electronically by: Mack Guise, MD  [facility]  [address] [place], [place] [address] Phone: [phone] FAX: [phone]   CC: [address], [address]  [place] [address] Phone: [phone]  Fax: [phone]    Return to Endocrinology clinic as below: Future Appointments  Date Time Provider [place]  05/25/2019  2:30 PM Melvenia Beam, MD [facility] None  06/19/2019  2:50 PM Narda Amber K, DO [facility] None

## 2019-05-11 DIAGNOSIS — E785 Hyperlipidemia, unspecified: Secondary | ICD-10-CM | POA: Insufficient documentation

## 2019-05-16 ENCOUNTER — Other Ambulatory Visit: Payer: Self-pay

## 2019-05-16 ENCOUNTER — Telehealth: Payer: Self-pay | Admitting: Internal Medicine

## 2019-05-16 ENCOUNTER — Encounter: Payer: Self-pay | Admitting: Internal Medicine

## 2019-05-16 ENCOUNTER — Telehealth (INDEPENDENT_AMBULATORY_CARE_PROVIDER_SITE_OTHER): Payer: BC Managed Care – PPO | Admitting: Internal Medicine

## 2019-05-16 VITALS — Ht 74.0 in | Wt 185.0 lb

## 2019-05-16 DIAGNOSIS — E1165 Type 2 diabetes mellitus with hyperglycemia: Secondary | ICD-10-CM

## 2019-05-16 DIAGNOSIS — M79674 Pain in right toe(s): Secondary | ICD-10-CM | POA: Diagnosis not present

## 2019-05-16 DIAGNOSIS — F17200 Nicotine dependence, unspecified, uncomplicated: Secondary | ICD-10-CM | POA: Diagnosis not present

## 2019-05-16 DIAGNOSIS — Z79899 Other long term (current) drug therapy: Secondary | ICD-10-CM | POA: Diagnosis not present

## 2019-05-16 DIAGNOSIS — IMO0001 Reserved for inherently not codable concepts without codable children: Secondary | ICD-10-CM

## 2019-05-16 DIAGNOSIS — R634 Abnormal weight loss: Secondary | ICD-10-CM | POA: Diagnosis not present

## 2019-05-16 DIAGNOSIS — M79675 Pain in left toe(s): Secondary | ICD-10-CM

## 2019-05-16 MED ORDER — NEBIVOLOL HCL 5 MG PO TABS: 5.0000 mg | ORAL_TABLET | Freq: Every day | ORAL | 1 refills | Status: DC

## 2019-05-16 NOTE — Progress Notes (Signed)
Virtual Visit via Video Note  I connected with@ on 05/16/19 at  3:30 PM EDT by a video enabled telemedicine application and verified that I am speaking with the correct person using two identifiers. Location patient: home Location provider:work or home office Persons participating in the virtual visit: patient, provider  [place] national recommendations  regarding COVID 19 pandemic   video visit is advised over in office visit for this patient.  Patient aware  of the limitations of evaluation and management by telemedicine and  availability of in person appointments. and agreed to proceed.   HPI: Todd Rodriguez presents for video visit Fatigue Pt c/o fatigue, weight loss and no appetite in the past month. He said he has lost 10 lbs. Denies nausea, fever or diarrhea. And feet  Toes hurt   Feels like walking on glass But no color change temperature change  and  Had nl foot exam when went to endo  .  Says sugars are  In 170 range and better   . No fever syncope is sleeping better in a recliner chair.   His diabetes went through and he recieved   disability Did see  diabetes specialist  Uncertain counseling  As needed and mood much better    At this time     Is on gaba  Tid for neck neuropathy   He continues to smoke 1ppd but no etoh at this time. No edema   ROS: See pertinent positives and negatives per HPI.  Past Medical History:  Diagnosis Date  . Acute pancreatitis 10/25/2013  . Arthritis   . Diabetes mellitus without complication ([place])   . GERD (gastroesophageal reflux disease)   . Hypertension   . Myalgia and myositis 04/21/2014   much better from illness presumed infectious    has disability form  will complete    . Pancreatitis     Past Surgical History:  Procedure Laterality Date  . ANTERIOR CERVICAL DECOMP/DISCECTOMY FUSION N/A 01/12/2018   Procedure: Revision ACDF C4-5, removal of hardware C5-6, exploration of fusion C5-6;  Surgeon: [name], MD;  Location: [place];   Service: Orthopedics;  Laterality: N/A;  3.5 hrs  . NECK SURGERY     2 disc  . SPINAL FUSION      Family History  Problem Relation Age of Onset  . Diabetes Mother   . Hypertension Mother   . Diabetes Father   . Hypertension Father   . Arthritis Maternal Grandmother   . Hypertension Maternal Grandfather   . Diabetes Maternal Grandfather   . Colon cancer Neg Hx   . Pancreatitis Neg Hx   . Pancreatic cancer Neg Hx     Social History   Tobacco Use  . Smoking status: Current Every Day Smoker    Packs/day: 1.00    Types: Cigarettes  . Smokeless tobacco: Never Used  Substance Use Topics  . Alcohol use: Yes    Comment: every weekend  . Drug use: No      Current Outpatient Medications:  .  albuterol (VENTOLIN HFA) 108 (90 Base) MCG/ACT inhaler, TAKE 2 PUFFS BY MOUTH EVERY 6 HOURS AS NEEDED FOR WHEEZE OR SHORTNESS OF BREATH, Disp: 1 g, Rfl: 1 .  amLODipine-benazepril (LOTREL) 10-40 MG capsule, TAKE 1 CAPSULE BY MOUTH EVERY DAY, Disp: 30 capsule, Rfl: 2 .  atorvastatin (LIPITOR) 40 MG tablet, Take 1 tablet (40 mg total) by mouth daily., Disp: 90 tablet, Rfl: 1 .  citalopram (CELEXA) 20 MG tablet, TAKE 10 MG BY MOUTH  PER DAY FOR 1 WEEK , THEN INCREASE TO 20 MG BY MOUTH PER DAY (Patient taking differently: Take 20 mg by mouth daily. ), Disp: 24 tablet, Rfl: 0 .  empagliflozin (JARDIANCE) 25 MG TABS tablet, Take 25 mg by mouth daily., Disp: 30 tablet, Rfl: 6 .  gabapentin (NEURONTIN) 300 MG capsule, Take 300 mg by mouth 3 (three) times daily. , Disp: , Rfl:  .  glipiZIDE (GLUCOTROL) 5 MG tablet, Take 1 tablet (5 mg total) by mouth 2 (two) times daily before a meal., Disp: 60 tablet, Rfl: 3 .  glucose blood (ONE TOUCH TEST STRIPS) test strip, Use as instructed, Disp: 100 each, Rfl: 11 .  LORazepam (ATIVAN) 1 MG tablet, Take 1 tablet (1 mg total) by mouth every 8 (eight) hours as needed for anxiety. Panic attacks, Disp: 20 tablet, Rfl: 0 .  metFORMIN (GLUCOPHAGE) 1000 MG tablet, Take 1  tablet (1,000 mg total) by mouth 2 (two) times daily with a meal., Disp: 180 tablet, Rfl: 3 .  pantoprazole (PROTONIX) 40 MG tablet, TAKE 1 TABLET BY MOUTH TWICE A DAY (INS WILL ONLY PAY ONCE A DAY), Disp: 30 tablet, Rfl: 5 .  naproxen (NAPROSYN) 500 MG tablet, , Disp: , Rfl:  .  nebivolol (BYSTOLIC) 5 MG tablet, Take 1 tablet (5 mg total) by mouth daily. For high blood pressure, Disp: 30 tablet, Rfl: 1 .  ondansetron (ZOFRAN ODT) 4 MG disintegrating tablet, Take 1 tablet (4 mg total) by mouth every 8 (eight) hours as needed for nausea or vomiting. (Patient not taking: Reported on 05/16/2019), Disp: 20 tablet, Rfl: 0  EXAM: BP Readings from Last 3 Encounters:  05/10/19 (!) 158/102  09/12/18 (!) 150/100  05/24/18 132/80   Wt Readings from Last 3 Encounters:  05/16/19 185 lb (83.9 kg)  05/10/19 184 lb 9.6 oz (83.7 kg)  09/12/18 197 lb 9.6 oz (89.6 kg)    VITALS per patient if applicable: say bp is 779/39 80 range   BP Readings from Last 3 Encounters:  05/10/19 (!) 158/102  09/12/18 (!) 150/100  05/24/18 132/80     GENERAL: alert, oriented, appears well and in no acute distress  HEENT: atraumatic, conjunttiva clear, no obvious abnormalities on inspection of external nose and ears  NECK: normal movements of the head and neck LUNGS: on inspection no signs of respiratory distress, breathing rate appears normal, no obvious gross SOB, gasping or wheezing CV: no obvious cyanosis  MS: moves all visible extremities without noticeable abnormality  PSYCH/NEURO: pleasant and cooperative,  More relaxed and nl speech  Lab Results  Component Value Date   WBC 3.2 (L) 03/21/2019   HGB 15.1 03/21/2019   HCT 44.6 03/21/2019   PLT 259.0 03/21/2019   GLUCOSE 200 (H) 03/21/2019   CHOL 275 (H) 03/21/2019   TRIG 386.0 (H) 03/21/2019   HDL 35.30 (L) 03/21/2019   LDLDIRECT 193.0 03/21/2019   LDLCALC 216 (H) 07/19/2018   ALT 30 03/21/2019   AST 31 03/21/2019   NA 133 (L) 03/21/2019   K 4.2  03/21/2019   CL 93 (L) 03/21/2019   CREATININE 0.93 03/21/2019   BUN 13 03/21/2019   CO2 23 03/21/2019   TSH 1.03 03/21/2019   PSA 0.76 01/07/2016   HGBA1C 9.5 (H) 03/21/2019   MICROALBUR 2.4 (H) 03/21/2019    ASSESSMENT AND PLAN:  Discussed the following assessment and plan:    ICD-10-CM   1. Medication management  Z79.899   2. Weight loss  R63.4   3.  Toe pain, bilateral sounds like neuropathy  M79.674    M79.675    He actually is doing better otherwise .  Weigh loss could have been from his dm and disc important  fo control but not certain fits timeline.  And needs follow up   Toe pains sx cw neuropathy despite ok foot exam last month  Promote good suagr control and  ROV VV ok in 1 month  . To decide further eval as indicated  bp still high at home and not at goal  Will add bystolic   Instead of diuretic as is on   jardiance and some frequency at this time.    Will be due for  Updated labs also soon  He can try 2 gaba at night if toe pain is problematic    Counseled.   Expectant management and discussion of plan and treatment with opportunity to ask questions and all were answered. The patient agreed with the plan and demonstrated an understanding of the instructions.   Advised to call back or seek an in-person evaluation if worsening  or having  further concerns .    Shanon Ace, MD

## 2019-05-16 NOTE — Telephone Encounter (Signed)
Called pt and scheduled virtual visit today with Dr. Regis Bill.

## 2019-05-16 NOTE — Telephone Encounter (Signed)
Copied from [place] [id number]. Topic: Appointment Scheduling - Scheduling Inquiry for Clinic >> May 16, 2019  9:46 AM Todd Rodriguez wrote: Reason for CRM:   Pt's wife calling to get an appointment for the pt.  States pt has low energy, low appetite, has lost weight.  She feels that something just isn't right.

## 2019-05-18 ENCOUNTER — Other Ambulatory Visit: Payer: Self-pay | Admitting: Internal Medicine

## 2019-05-25 ENCOUNTER — Ambulatory Visit: Payer: BC Managed Care – PPO | Admitting: Neurology

## 2019-05-25 ENCOUNTER — Encounter

## 2019-05-25 ENCOUNTER — Telehealth: Payer: Self-pay | Admitting: *Deleted

## 2019-05-25 NOTE — Telephone Encounter (Signed)
Pt no showed his new patient appt today.

## 2019-05-27 ENCOUNTER — Other Ambulatory Visit: Payer: Self-pay

## 2019-05-27 ENCOUNTER — Encounter (HOSPITAL_COMMUNITY): Payer: Self-pay

## 2019-05-27 ENCOUNTER — Inpatient Hospital Stay (HOSPITAL_COMMUNITY)
Admission: EM | Admit: 2019-05-27 | Discharge: 2019-05-29 | DRG: 640 | Disposition: A | Discharge status: Home / Self Care | Payer: BC Managed Care – PPO | Attending: Internal Medicine | Admitting: Internal Medicine

## 2019-05-27 ENCOUNTER — Emergency Department (HOSPITAL_COMMUNITY): Payer: BC Managed Care – PPO

## 2019-05-27 DIAGNOSIS — R824 Acetonuria: Secondary | ICD-10-CM | POA: Diagnosis present

## 2019-05-27 DIAGNOSIS — R52 Pain, unspecified: Secondary | ICD-10-CM

## 2019-05-27 DIAGNOSIS — E785 Hyperlipidemia, unspecified: Secondary | ICD-10-CM | POA: Diagnosis present

## 2019-05-27 DIAGNOSIS — Z20828 Contact with and (suspected) exposure to other viral communicable diseases: Secondary | ICD-10-CM | POA: Diagnosis present

## 2019-05-27 DIAGNOSIS — E872 Acidosis, unspecified: Secondary | ICD-10-CM

## 2019-05-27 DIAGNOSIS — F329 Major depressive disorder, single episode, unspecified: Secondary | ICD-10-CM | POA: Diagnosis present

## 2019-05-27 DIAGNOSIS — IMO0001 Reserved for inherently not codable concepts without codable children: Secondary | ICD-10-CM | POA: Diagnosis present

## 2019-05-27 DIAGNOSIS — R072 Precordial pain: Secondary | ICD-10-CM | POA: Diagnosis not present

## 2019-05-27 DIAGNOSIS — E43 Unspecified severe protein-calorie malnutrition: Secondary | ICD-10-CM | POA: Diagnosis present

## 2019-05-27 DIAGNOSIS — K863 Pseudocyst of pancreas: Secondary | ICD-10-CM | POA: Diagnosis present

## 2019-05-27 DIAGNOSIS — E8729 Other acidosis: Secondary | ICD-10-CM | POA: Diagnosis present

## 2019-05-27 DIAGNOSIS — K769 Liver disease, unspecified: Secondary | ICD-10-CM | POA: Diagnosis present

## 2019-05-27 DIAGNOSIS — R109 Unspecified abdominal pain: Secondary | ICD-10-CM

## 2019-05-27 DIAGNOSIS — I10 Essential (primary) hypertension: Secondary | ICD-10-CM | POA: Diagnosis present

## 2019-05-27 DIAGNOSIS — R0789 Other chest pain: Secondary | ICD-10-CM

## 2019-05-27 DIAGNOSIS — K760 Fatty (change of) liver, not elsewhere classified: Secondary | ICD-10-CM | POA: Diagnosis present

## 2019-05-27 DIAGNOSIS — E8889 Other specified metabolic disorders: Secondary | ICD-10-CM

## 2019-05-27 DIAGNOSIS — R1013 Epigastric pain: Secondary | ICD-10-CM | POA: Diagnosis present

## 2019-05-27 DIAGNOSIS — Z8249 Family history of ischemic heart disease and other diseases of the circulatory system: Secondary | ICD-10-CM

## 2019-05-27 DIAGNOSIS — D696 Thrombocytopenia, unspecified: Secondary | ICD-10-CM | POA: Diagnosis present

## 2019-05-27 DIAGNOSIS — E1165 Type 2 diabetes mellitus with hyperglycemia: Secondary | ICD-10-CM | POA: Diagnosis present

## 2019-05-27 DIAGNOSIS — K862 Cyst of pancreas: Secondary | ICD-10-CM | POA: Diagnosis present

## 2019-05-27 DIAGNOSIS — F17210 Nicotine dependence, cigarettes, uncomplicated: Secondary | ICD-10-CM | POA: Diagnosis present

## 2019-05-27 DIAGNOSIS — K2970 Gastritis, unspecified, without bleeding: Secondary | ICD-10-CM | POA: Diagnosis present

## 2019-05-27 DIAGNOSIS — E860 Dehydration: Secondary | ICD-10-CM | POA: Diagnosis present

## 2019-05-27 DIAGNOSIS — Z981 Arthrodesis status: Secondary | ICD-10-CM

## 2019-05-27 DIAGNOSIS — F1010 Alcohol abuse, uncomplicated: Secondary | ICD-10-CM | POA: Diagnosis present

## 2019-05-27 LAB — CBC
HCT: 48.8 % (ref 39.0–52.0)
Hemoglobin: 15.6 g/dL (ref 13.0–17.0)
MCH: 33 pg (ref 26.0–34.0)
MCHC: 32 g/dL (ref 30.0–36.0)
MCV: 103.2 fL — ABNORMAL HIGH (ref 80.0–100.0)
Platelets: 103 10*3/uL — ABNORMAL LOW (ref 150–400)
RBC: 4.73 MIL/uL (ref 4.22–5.81)
RDW: 12.2 % (ref 11.5–15.5)
WBC: 4.8 10*3/uL (ref 4.0–10.5)
nRBC: 0 % (ref 0.0–0.2)

## 2019-05-27 LAB — BASIC METABOLIC PANEL
Anion gap: 23 — ABNORMAL HIGH (ref 5–15)
BUN: 10 mg/dL (ref 6–20)
CO2: 15 mmol/L — ABNORMAL LOW (ref 22–32)
Calcium: 9.8 mg/dL (ref 8.9–10.3)
Chloride: 98 mmol/L (ref 98–111)
Creatinine, Ser: 1.05 mg/dL (ref 0.61–1.24)
GFR calc Af Amer: >60 mL/min (ref >60)
GFR calc non Af Amer: >60 mL/min (ref >60)
Glucose, Bld: 179 mg/dL — ABNORMAL HIGH (ref 70–99)
Potassium: 4.6 mmol/L (ref 3.5–5.1)
Sodium: 136 mmol/L (ref 135–145)

## 2019-05-27 LAB — TROPONIN I (HIGH SENSITIVITY): Troponin I (High Sensitivity): 4 ng/L (ref <18)

## 2019-05-27 LAB — BRAIN NATRIURETIC PEPTIDE: B Natriuretic Peptide: 17 pg/mL (ref 0.0–100.0)

## 2019-05-27 LAB — D-DIMER, QUANTITATIVE: D-Dimer, Quant: <0.27 ug/mL-FEU (ref 0.00–0.50)

## 2019-05-27 MED ORDER — SODIUM CHLORIDE 0.9 % IV SOLN
INTRAVENOUS | Status: DC
  Administered 2019-05-27: 22:00:00 via INTRAVENOUS

## 2019-05-27 MED ORDER — SODIUM CHLORIDE 0.9% FLUSH
3.0000 mL | Freq: Once | INTRAVENOUS | Status: AC
  Administered 2019-05-27: 3 mL via INTRAVENOUS

## 2019-05-27 MED ORDER — ALBUTEROL SULFATE HFA 108 (90 BASE) MCG/ACT IN AERS
2.0000 | INHALATION_SPRAY | Freq: Four times a day (QID) | RESPIRATORY_TRACT | Status: DC
  Administered 2019-05-27 – 2019-05-28 (×3): 2 via RESPIRATORY_TRACT
  Filled 2019-05-27: qty 6.7

## 2019-05-27 MED ORDER — ONDANSETRON HCL 4 MG/2ML IJ SOLN
4.0000 mg | Freq: Once | INTRAMUSCULAR | Status: AC
  Administered 2019-05-27: 4 mg via INTRAVENOUS
  Filled 2019-05-27: qty 2

## 2019-05-27 MED ORDER — HYDROMORPHONE HCL 1 MG/ML IJ SOLN
0.5000 mg | Freq: Once | INTRAMUSCULAR | Status: AC
  Administered 2019-05-27: 0.5 mg via INTRAVENOUS
  Filled 2019-05-27: qty 1

## 2019-05-27 NOTE — Discharge Instructions (Addendum)
Use the albuterol inhaler 2 puffs every 6 hours.  Start taking a baby aspirin a day.  Make an appointment to follow-up with your doctor.  Also recommend that you follow-up with cardiology.  He should also have an MRI of your abdomen to assess your pancreas as well as liver as there were cystic lesions seen on the CT scan today.  Return for any new or worse symptoms.

## 2019-05-27 NOTE — ED Notes (Addendum)
EKG given to Dr. Rogene Houston

## 2019-05-27 NOTE — ED Triage Notes (Signed)
Pt states his left arm has been hurting and he has had a high heart rate. Is very lethargic. Denies any cardiac history

## 2019-05-27 NOTE — ED Provider Notes (Signed)
[facility] EMERGENCY DEPARTMENT Provider Note   CSN: [id number] Arrival date & time: 05/27/19  1813     History   Chief Complaint Chief Complaint  Patient presents with  . Chest Pain    HPI Todd Rodriguez is a 43 y.o. male.     Patient with complaint of left chest pain for the past 2 days.  It is intermittent.  At most it last about an hour but it happens frequently throughout the day.  Was still having some chest pain just shortly before I saw him.  Associated with some shortness of breath.  Associated with some nausea.  No vomiting.  No fever no history of similar pain.  Past medical history is significant for diabetes hypertension pancreatitis in the past.  Known history of gastroesophageal reflux disease.     Past Medical History:  Diagnosis Date  . Acute pancreatitis 10/25/2013  . Arthritis   . Diabetes mellitus without complication ([place])   . GERD (gastroesophageal reflux disease)   . Hypertension   . Myalgia and myositis 04/21/2014   much better from illness presumed infectious    has disability form  will complete    . Pancreatitis     Patient Active Problem List   Diagnosis Date Noted  . Dyslipidemia 05/11/2019  . Abnormal LFTs 09/12/2018  . Medication management 09/12/2018  . 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 Carpal tunnel syndrome of left wrist 12/10/2017  . Cervical radiculopathy 11/11/2017  . History of fusion of cervical spine 10/22/2017  . Cramps, extremity 06/25/2015  . Diabetes mellitus type 2, uncontrolled, without complications ([place]) 40/98/1191  . Hand cramps 01/25/2015  . Fever, unspecified 03/26/2014  . Hx of acute pancreatitis 01/03/2014  . Tobacco dependence 01/03/2014  . Other and unspecified hyperlipidemia 01/03/2014  . GERD (gastroesophageal reflux disease) 11/22/2013  . Diabetes mellitus type 2, controlled, without complications ([place]) 47/82/9562  . Essential  hypertension, benign 10/25/2013  . Leukocytosis, unspecified 10/25/2013    Past Surgical History:  Procedure Laterality Date  . ANTERIOR CERVICAL DECOMP/DISCECTOMY FUSION N/A 01/12/2018   Procedure: Revision ACDF C4-5, removal of hardware C5-6, exploration of fusion C5-6;  Surgeon: [name], MD;  Location: [place];  Service: Orthopedics;  Laterality: N/A;  3.5 hrs  . NECK SURGERY     2 disc  . SPINAL FUSION          Home Medications    Prior to Admission medications   Medication Sig Start Date End Date Taking? Authorizing Provider  albuterol (VENTOLIN HFA) 108 (90 Base) MCG/ACT inhaler TAKE 2 PUFFS BY MOUTH EVERY 6 HOURS AS NEEDED FOR WHEEZE OR SHORTNESS OF BREATH 05/04/19  Yes Panosh, Standley Brooking, MD  amLODipine-benazepril (LOTREL) 10-40 MG capsule TAKE 1 CAPSULE BY MOUTH EVERY DAY 04/12/19  Yes Panosh, Standley Brooking, MD  atorvastatin (LIPITOR) 40 MG tablet Take 1 tablet (40 mg total) by mouth daily. 03/23/19  Yes Panosh, Standley Brooking, MD  empagliflozin (JARDIANCE) 25 MG TABS tablet Take 25 mg by mouth daily. 05/10/19  Yes Shamleffer, Melanie Crazier, MD  gabapentin (NEURONTIN) 300 MG capsule Take 300 mg by mouth 3 (three) times daily.  04/12/19  Yes [provider]  glimepiride (AMARYL) 4 MG tablet Take 1.5 tablets by mouth every morning. 05/12/19  Yes [provider]  glipiZIDE (GLUCOTROL) 5 MG tablet Take 1 tablet (5 mg total) by mouth 2 (two) times daily before a meal. 05/10/19  Yes Shamleffer, Mammie Lorenzo  Amedeo Kinsman, MD  metFORMIN (GLUCOPHAGE) 1000 MG tablet Take 1 tablet (1,000 mg total) by mouth 2 (two) times daily with a meal. 05/10/19  Yes Shamleffer, Melanie Crazier, MD  nebivolol (BYSTOLIC) 5 MG tablet Take 1 tablet (5 mg total) by mouth daily. For high blood pressure 05/16/19  Yes Panosh, Standley Brooking, MD  pantoprazole (PROTONIX) 40 MG tablet TAKE 1 TABLET BY MOUTH TWICE A DAY (INS WILL ONLY PAY ONCE A DAY) 03/14/19  Yes Panosh, Standley Brooking, MD  citalopram (CELEXA) 20 MG tablet TAKE 10 MG BY MOUTH  PER DAY FOR 1 WEEK , THEN INCREASE TO 20 MG BY MOUTH PER DAY Patient taking differently: Take 20 mg by mouth daily.  10/20/18   Panosh, Standley Brooking, MD  glucose blood (ONE TOUCH TEST STRIPS) test strip Use as instructed 06/25/15   Panosh, Standley Brooking, MD  LORazepam (ATIVAN) 1 MG tablet Take 1 tablet (1 mg total) by mouth every 8 (eight) hours as needed for anxiety. Panic attacks 05/24/18   Panosh, Standley Brooking, MD  naproxen (NAPROSYN) 500 MG tablet  05/04/19   [provider]  ondansetron (ZOFRAN ODT) 4 MG disintegrating tablet Take 1 tablet (4 mg total) by mouth every 8 (eight) hours as needed for nausea or vomiting. Patient not taking: Reported on 05/16/2019 01/12/18   Mayo, Darla Lesches, PA-C    Family History Family History  Problem Relation Age of Onset  . Diabetes Mother   . Hypertension Mother   . Diabetes Father   . Hypertension Father   . Arthritis Maternal Grandmother   . Hypertension Maternal Grandfather   . Diabetes Maternal Grandfather   . Colon cancer Neg Hx   . Pancreatitis Neg Hx   . Pancreatic cancer Neg Hx     Social History Social History   Tobacco Use  . Smoking status: Current Every Day Smoker    Packs/day: 1.00    Types: Cigarettes  . Smokeless tobacco: Never Used  Substance Use Topics  . Alcohol use: Yes    Comment: every weekend  . Drug use: No     Allergies   Patient has no known allergies.   Review of Systems Review of Systems  Constitutional: Negative for chills and fever.  HENT: Negative for congestion, rhinorrhea and sore throat.   Eyes: Negative for visual disturbance.  Respiratory: Positive for shortness of breath. Negative for cough.   Cardiovascular: Positive for chest pain. Negative for leg swelling.  Gastrointestinal: Positive for nausea. Negative for abdominal pain, diarrhea and vomiting.  Genitourinary: Negative for dysuria.  Musculoskeletal: Negative for back pain and neck pain.  Skin: Negative for rash.  Neurological: Negative for  dizziness, light-headedness and headaches.  Hematological: Does not bruise/bleed easily.  Psychiatric/Behavioral: Negative for confusion.     Physical Exam Updated Vital Signs BP (!) 177/118   Pulse 92   Temp 98.6 F (37 C) (Oral)   Resp 15   Ht 1.88 m (6' 2" )   Wt 81.6 kg   SpO2 99%   BMI 23.11 kg/m   Physical Exam Vitals signs and nursing note reviewed.  Constitutional:      Appearance: Normal appearance. He is well-developed.  HENT:     Head: Normocephalic and atraumatic.  Eyes:     Extraocular Movements: Extraocular movements intact.     Conjunctiva/sclera: Conjunctivae normal.     Pupils: Pupils are equal, round, and reactive to light.  Neck:     Musculoskeletal: Normal range of motion and neck supple.  Cardiovascular:  Rate and Rhythm: Normal rate and regular rhythm.     Heart sounds: No murmur.  Pulmonary:     Effort: Pulmonary effort is normal. No respiratory distress.     Breath sounds: Normal breath sounds. No wheezing.  Abdominal:     General: Bowel sounds are normal.     Palpations: Abdomen is soft.     Tenderness: There is no abdominal tenderness.  Musculoskeletal: Normal range of motion.  Skin:    General: Skin is warm and dry.  Neurological:     General: No focal deficit present.     Mental Status: He is alert and oriented to person, place, and time.      ED Treatments / Results  Labs (all labs ordered are listed, but only abnormal results are displayed) Labs Reviewed  BASIC METABOLIC PANEL - Abnormal; Notable for the following components:      Result Value   CO2 15 (*)    Glucose, Bld 179 (*)    Anion gap 23 (*)    All other components within normal limits  CBC - Abnormal; Notable for the following components:   MCV 103.2 (*)    Platelets 103 (*)    All other components within normal limits  D-DIMER, QUANTITATIVE (NOT AT [facility])  BRAIN NATRIURETIC PEPTIDE  TROPONIN I (HIGH SENSITIVITY)    EKG EKG Interpretation  Date/Time:   Saturday May 27 2019 20:26:09 EDT Ventricular Rate:  103 PR Interval:    QRS Duration: 85 QT Interval:  328 QTC Calculation: 430 R Axis:   92 Text Interpretation:  Sinus tachycardia Borderline right axis deviation Anteroseptal infarct, old Confirmed by Fredia Sorrow [phone]) on 05/27/2019 8:34:26 PM   Radiology Dg Chest 2 View  Result Date: 05/27/2019 CLINICAL DATA:  Palpitations EXAM: CHEST - 2 VIEW COMPARISON:  09/12/2018 FINDINGS: Lungs are clear.  No pleural effusion or pneumothorax. The heart is normal in size. Visualized osseous structures are within normal limits. IMPRESSION: Normal chest radiographs. Electronically Signed   By: Julian Hy M.D.   On: 05/27/2019 18:56    Procedures Procedures (including critical care time)  Medications Ordered in ED Medications  albuterol (VENTOLIN HFA) 108 (90 Base) MCG/ACT inhaler 2 puff (2 puffs Inhalation Given 05/27/19 2155)  0.9 %  sodium chloride infusion ( Intravenous New Bag/Given 05/27/19 2200)  sodium chloride flush (NS) 0.9 % injection 3 mL (3 mLs Intravenous Given 05/27/19 2056)  ondansetron (ZOFRAN) injection 4 mg (4 mg Intravenous Given 05/27/19 2156)  HYDROmorphone (DILAUDID) injection 0.5 mg (0.5 mg Intravenous Given 05/27/19 2156)     Initial Impression / Assessment and Plan / ED Course  I have reviewed the triage vital signs and the nursing notes.  Pertinent labs & imaging results that were available during my care of the patient were reviewed by me and considered in my medical decision making (see chart for details).   Patient chest pain and risk factors of some concern.  Initial troponin was 4.  Will need a delta troponin.  Next troponin not due until sometime after 11 PM.  Patient's d-dimer was normal.  Patient chest x-ray without any acute findings.  Did give a trial of albuterol inhaler patient feels significantly better.  All symptoms have resolved.  But due to his risk factors will will wait for disposition  based on the delta troponin.  Will turn patient over to the overnight emergency physician Dr. Wyvonnia Dusky.  If patient can be discharged home would recommend follow-up with cardiology would recommend start  taking a baby aspirin a day and continue to use the albuterol inhaler 2 puffs every 6 hours at least for 7 days.       Final Clinical Impressions(s) / ED Diagnoses   Final diagnoses:  Precordial pain    ED Discharge Orders    None       Fredia Sorrow, MD 05/27/19 2309

## 2019-05-28 ENCOUNTER — Other Ambulatory Visit: Payer: Self-pay

## 2019-05-28 ENCOUNTER — Inpatient Hospital Stay (HOSPITAL_COMMUNITY): Payer: BC Managed Care – PPO

## 2019-05-28 ENCOUNTER — Encounter (HOSPITAL_COMMUNITY): Payer: Self-pay | Admitting: *Deleted

## 2019-05-28 ENCOUNTER — Emergency Department (HOSPITAL_COMMUNITY): Payer: BC Managed Care – PPO

## 2019-05-28 DIAGNOSIS — F17210 Nicotine dependence, cigarettes, uncomplicated: Secondary | ICD-10-CM | POA: Diagnosis present

## 2019-05-28 DIAGNOSIS — R072 Precordial pain: Secondary | ICD-10-CM | POA: Diagnosis present

## 2019-05-28 DIAGNOSIS — Z20828 Contact with and (suspected) exposure to other viral communicable diseases: Secondary | ICD-10-CM | POA: Diagnosis present

## 2019-05-28 DIAGNOSIS — K2970 Gastritis, unspecified, without bleeding: Secondary | ICD-10-CM | POA: Diagnosis present

## 2019-05-28 DIAGNOSIS — F1010 Alcohol abuse, uncomplicated: Secondary | ICD-10-CM | POA: Diagnosis present

## 2019-05-28 DIAGNOSIS — E8729 Other acidosis: Secondary | ICD-10-CM | POA: Diagnosis present

## 2019-05-28 DIAGNOSIS — D696 Thrombocytopenia, unspecified: Secondary | ICD-10-CM | POA: Diagnosis present

## 2019-05-28 DIAGNOSIS — E872 Acidosis: Secondary | ICD-10-CM | POA: Diagnosis present

## 2019-05-28 DIAGNOSIS — F329 Major depressive disorder, single episode, unspecified: Secondary | ICD-10-CM | POA: Diagnosis present

## 2019-05-28 DIAGNOSIS — I10 Essential (primary) hypertension: Secondary | ICD-10-CM | POA: Diagnosis present

## 2019-05-28 DIAGNOSIS — E785 Hyperlipidemia, unspecified: Secondary | ICD-10-CM | POA: Diagnosis present

## 2019-05-28 DIAGNOSIS — R1013 Epigastric pain: Secondary | ICD-10-CM | POA: Diagnosis present

## 2019-05-28 DIAGNOSIS — E860 Dehydration: Secondary | ICD-10-CM | POA: Diagnosis present

## 2019-05-28 DIAGNOSIS — R824 Acetonuria: Secondary | ICD-10-CM | POA: Diagnosis present

## 2019-05-28 DIAGNOSIS — K760 Fatty (change of) liver, not elsewhere classified: Secondary | ICD-10-CM | POA: Diagnosis present

## 2019-05-28 DIAGNOSIS — K862 Cyst of pancreas: Secondary | ICD-10-CM | POA: Diagnosis present

## 2019-05-28 DIAGNOSIS — K769 Liver disease, unspecified: Secondary | ICD-10-CM | POA: Diagnosis present

## 2019-05-28 DIAGNOSIS — E43 Unspecified severe protein-calorie malnutrition: Secondary | ICD-10-CM | POA: Diagnosis present

## 2019-05-28 DIAGNOSIS — Z981 Arthrodesis status: Secondary | ICD-10-CM | POA: Diagnosis not present

## 2019-05-28 DIAGNOSIS — Z8249 Family history of ischemic heart disease and other diseases of the circulatory system: Secondary | ICD-10-CM | POA: Diagnosis not present

## 2019-05-28 DIAGNOSIS — E1165 Type 2 diabetes mellitus with hyperglycemia: Secondary | ICD-10-CM | POA: Diagnosis present

## 2019-05-28 DIAGNOSIS — K863 Pseudocyst of pancreas: Secondary | ICD-10-CM | POA: Diagnosis present

## 2019-05-28 LAB — COMPREHENSIVE METABOLIC PANEL
ALT: 76 U/L — ABNORMAL HIGH (ref 0–44)
AST: 108 U/L — ABNORMAL HIGH (ref 15–41)
Albumin: 4.6 g/dL (ref 3.5–5.0)
Alkaline Phosphatase: 72 U/L (ref 38–126)
Anion gap: 20 — ABNORMAL HIGH (ref 5–15)
BUN: 9 mg/dL (ref 6–20)
CO2: 14 mmol/L — ABNORMAL LOW (ref 22–32)
Calcium: 8.8 mg/dL — ABNORMAL LOW (ref 8.9–10.3)
Chloride: 101 mmol/L (ref 98–111)
Creatinine, Ser: 0.97 mg/dL (ref 0.61–1.24)
GFR calc Af Amer: >60 mL/min (ref >60)
GFR calc non Af Amer: >60 mL/min (ref >60)
Glucose, Bld: 184 mg/dL — ABNORMAL HIGH (ref 70–99)
Potassium: 5.1 mmol/L (ref 3.5–5.1)
Sodium: 135 mmol/L (ref 135–145)
Total Bilirubin: 3.3 mg/dL — ABNORMAL HIGH (ref 0.3–1.2)
Total Protein: 7.9 g/dL (ref 6.5–8.1)

## 2019-05-28 LAB — BASIC METABOLIC PANEL
Anion gap: 17 — ABNORMAL HIGH (ref 5–15)
BUN: 7 mg/dL (ref 6–20)
CO2: 13 mmol/L — ABNORMAL LOW (ref 22–32)
Calcium: 8.3 mg/dL — ABNORMAL LOW (ref 8.9–10.3)
Chloride: 101 mmol/L (ref 98–111)
Creatinine, Ser: 0.88 mg/dL (ref 0.61–1.24)
GFR calc Af Amer: >60 mL/min (ref >60)
GFR calc non Af Amer: >60 mL/min (ref >60)
Glucose, Bld: 168 mg/dL — ABNORMAL HIGH (ref 70–99)
Potassium: 4.7 mmol/L (ref 3.5–5.1)
Sodium: 131 mmol/L — ABNORMAL LOW (ref 135–145)

## 2019-05-28 LAB — HEPATIC FUNCTION PANEL
ALT: 64 U/L — ABNORMAL HIGH (ref 0–44)
AST: 71 U/L — ABNORMAL HIGH (ref 15–41)
Albumin: 4.3 g/dL (ref 3.5–5.0)
Alkaline Phosphatase: 62 U/L (ref 38–126)
Bilirubin, Direct: 0.7 mg/dL — ABNORMAL HIGH (ref 0.0–0.2)
Indirect Bilirubin: 1.8 mg/dL — ABNORMAL HIGH (ref 0.3–0.9)
Total Bilirubin: 2.5 mg/dL — ABNORMAL HIGH (ref 0.3–1.2)
Total Protein: 7.2 g/dL (ref 6.5–8.1)

## 2019-05-28 LAB — LIPASE, BLOOD: Lipase: 43 U/L (ref 11–51)

## 2019-05-28 LAB — BLOOD GAS, VENOUS
Acid-base deficit: 13.3 mmol/L — ABNORMAL HIGH (ref 0.0–2.0)
Bicarbonate: 13.6 mmol/L — ABNORMAL LOW (ref 20.0–28.0)
FIO2: 21
O2 Saturation: 56.4 %
Patient temperature: 37
pCO2, Ven: 30.8 mmHg — ABNORMAL LOW (ref 44.0–60.0)
pH, Ven: 7.238 — ABNORMAL LOW (ref 7.250–7.430)
pO2, Ven: 34.9 mmHg (ref 32.0–45.0)

## 2019-05-28 LAB — CBG MONITORING, ED
Glucose-Capillary: 184 mg/dL — ABNORMAL HIGH (ref 70–99)
Glucose-Capillary: 209 mg/dL — ABNORMAL HIGH (ref 70–99)

## 2019-05-28 LAB — URINALYSIS, ROUTINE W REFLEX MICROSCOPIC
Bacteria, UA: NONE SEEN
Bilirubin Urine: NEGATIVE
Glucose, UA: 50 mg/dL — AB
Ketones, ur: 80 mg/dL — AB
Leukocytes,Ua: NEGATIVE
Nitrite: NEGATIVE
Protein, ur: 100 mg/dL — AB
Specific Gravity, Urine: 1.025 (ref 1.005–1.030)
pH: 6 (ref 5.0–8.0)

## 2019-05-28 LAB — HEMOGLOBIN A1C
Hgb A1c MFr Bld: 6.3 % — ABNORMAL HIGH (ref 4.8–5.6)
Mean Plasma Glucose: 134.11 mg/dL

## 2019-05-28 LAB — GLUCOSE, CAPILLARY
Glucose-Capillary: 130 mg/dL — ABNORMAL HIGH (ref 70–99)
Glucose-Capillary: 264 mg/dL — ABNORMAL HIGH (ref 70–99)
Glucose-Capillary: 121 mg/dL — ABNORMAL HIGH (ref 70–99)

## 2019-05-28 LAB — TROPONIN I (HIGH SENSITIVITY)
Troponin I (High Sensitivity): 5 ng/L (ref <18)
Troponin I (High Sensitivity): 6 ng/L (ref <18)

## 2019-05-28 LAB — SARS CORONAVIRUS 2 BY RT PCR (HOSPITAL ORDER, PERFORMED IN ~~LOC~~ HOSPITAL LAB): SARS Coronavirus 2: NEGATIVE

## 2019-05-28 LAB — LACTIC ACID, PLASMA
Lactic Acid, Venous: 1.4 mmol/L (ref 0.5–1.9)
Lactic Acid, Venous: 1 mmol/L (ref 0.5–1.9)

## 2019-05-28 LAB — ETHANOL: Alcohol, Ethyl (B): <10 mg/dL (ref <10)

## 2019-05-28 MED ORDER — ADULT MULTIVITAMIN W/MINERALS CH
1.0000 | ORAL_TABLET | Freq: Every day | ORAL | Status: DC
  Administered 2019-05-28 – 2019-05-29 (×2): 1 via ORAL
  Filled 2019-05-28 (×2): qty 1

## 2019-05-28 MED ORDER — ACETAMINOPHEN 650 MG RE SUPP: 650.0000 mg | Freq: Four times a day (QID) | RECTAL | Status: DC | PRN

## 2019-05-28 MED ORDER — SODIUM CHLORIDE 0.9 % IV SOLN
1.0000 g | INTRAVENOUS | Status: DC
  Administered 2019-05-28 – 2019-05-29 (×2): 1 g via INTRAVENOUS
  Filled 2019-05-28 (×2): qty 10

## 2019-05-28 MED ORDER — VITAMIN B-1 100 MG PO TABS
100.0000 mg | ORAL_TABLET | Freq: Every day | ORAL | Status: DC
  Administered 2019-05-28 – 2019-05-29 (×2): 100 mg via ORAL
  Filled 2019-05-28 (×2): qty 1

## 2019-05-28 MED ORDER — AMLODIPINE BESYLATE 5 MG PO TABS
10.0000 mg | ORAL_TABLET | Freq: Every day | ORAL | Status: DC
  Administered 2019-05-28 – 2019-05-29 (×2): 10 mg via ORAL
  Filled 2019-05-28 (×2): qty 2

## 2019-05-28 MED ORDER — IOHEXOL 350 MG/ML SOLN
100.0000 mL | Freq: Once | INTRAVENOUS | Status: AC | PRN
  Administered 2019-05-28: 100 mL via INTRAVENOUS

## 2019-05-28 MED ORDER — NEBIVOLOL HCL 10 MG PO TABS
5.0000 mg | ORAL_TABLET | Freq: Every day | ORAL | Status: DC
  Administered 2019-05-28 – 2019-05-29 (×2): 5 mg via ORAL
  Filled 2019-05-28 (×2): qty 1

## 2019-05-28 MED ORDER — ACETAMINOPHEN 325 MG PO TABS: 650.0000 mg | ORAL_TABLET | Freq: Four times a day (QID) | ORAL | Status: DC | PRN

## 2019-05-28 MED ORDER — INSULIN ASPART 100 UNIT/ML ~~LOC~~ SOLN
0.0000 [IU] | Freq: Four times a day (QID) | SUBCUTANEOUS | Status: DC
  Administered 2019-05-28: 3 [IU] via SUBCUTANEOUS
  Administered 2019-05-28: 5 [IU] via SUBCUTANEOUS
  Filled 2019-05-28: qty 1

## 2019-05-28 MED ORDER — ONDANSETRON HCL 4 MG/2ML IJ SOLN: 4.0000 mg | Freq: Four times a day (QID) | INTRAMUSCULAR | Status: DC | PRN

## 2019-05-28 MED ORDER — ONDANSETRON HCL 4 MG PO TABS: 4.0000 mg | ORAL_TABLET | Freq: Four times a day (QID) | ORAL | Status: DC | PRN

## 2019-05-28 MED ORDER — INSULIN ASPART 100 UNIT/ML ~~LOC~~ SOLN: 0.0000 [IU] | Freq: Four times a day (QID) | SUBCUTANEOUS | Status: DC

## 2019-05-28 MED ORDER — INSULIN ASPART 100 UNIT/ML ~~LOC~~ SOLN
0.0000 [IU] | Freq: Four times a day (QID) | SUBCUTANEOUS | Status: DC
  Administered 2019-05-29 (×2): 2 [IU] via SUBCUTANEOUS
  Administered 2019-05-29: 5 [IU] via SUBCUTANEOUS
  Administered 2019-05-29: 3 [IU] via SUBCUTANEOUS

## 2019-05-28 MED ORDER — LORAZEPAM 2 MG/ML IJ SOLN: 1.0000 mg | Freq: Four times a day (QID) | INTRAMUSCULAR | Status: DC | PRN

## 2019-05-28 MED ORDER — ENOXAPARIN SODIUM 40 MG/0.4ML ~~LOC~~ SOLN
40.0000 mg | SUBCUTANEOUS | Status: DC
  Administered 2019-05-28 – 2019-05-29 (×2): 40 mg via SUBCUTANEOUS
  Filled 2019-05-28 (×2): qty 0.4

## 2019-05-28 MED ORDER — MORPHINE SULFATE (PF) 2 MG/ML IV SOLN: 2.0000 mg | INTRAVENOUS | Status: DC | PRN

## 2019-05-28 MED ORDER — ATORVASTATIN CALCIUM 40 MG PO TABS
40.0000 mg | ORAL_TABLET | Freq: Every day | ORAL | Status: DC
  Administered 2019-05-28 – 2019-05-29 (×2): 40 mg via ORAL
  Filled 2019-05-28 (×2): qty 1

## 2019-05-28 MED ORDER — THIAMINE HCL 100 MG/ML IJ SOLN: 100.0000 mg | Freq: Every day | INTRAMUSCULAR | Status: DC

## 2019-05-28 MED ORDER — CITALOPRAM HYDROBROMIDE 20 MG PO TABS
20.0000 mg | ORAL_TABLET | Freq: Every day | ORAL | Status: DC
  Administered 2019-05-28 – 2019-05-29 (×2): 20 mg via ORAL
  Filled 2019-05-28 (×2): qty 1

## 2019-05-28 MED ORDER — GABAPENTIN 300 MG PO CAPS
300.0000 mg | ORAL_CAPSULE | Freq: Three times a day (TID) | ORAL | Status: DC
  Administered 2019-05-28 – 2019-05-29 (×5): 300 mg via ORAL
  Filled 2019-05-28 (×5): qty 1

## 2019-05-28 MED ORDER — SODIUM CHLORIDE 0.9 % IV SOLN
INTRAVENOUS | Status: DC
  Administered 2019-05-28: 06:00:00 via INTRAVENOUS

## 2019-05-28 MED ORDER — AMLODIPINE BESY-BENAZEPRIL HCL 10-40 MG PO CAPS: 1.0000 | ORAL_CAPSULE | Freq: Every day | ORAL | Status: DC

## 2019-05-28 MED ORDER — SODIUM BICARBONATE 8.4 % IV SOLN
INTRAVENOUS | Status: DC
  Administered 2019-05-28 – 2019-05-29 (×3): via INTRAVENOUS
  Filled 2019-05-28 (×6): qty 1000

## 2019-05-28 MED ORDER — FOLIC ACID 1 MG PO TABS
1.0000 mg | ORAL_TABLET | Freq: Every day | ORAL | Status: DC
  Administered 2019-05-28 – 2019-05-29 (×2): 1 mg via ORAL
  Filled 2019-05-28 (×2): qty 1

## 2019-05-28 MED ORDER — CHLORPROMAZINE HCL 25 MG PO TABS
25.0000 mg | ORAL_TABLET | Freq: Once | ORAL | Status: AC
  Administered 2019-05-28: 25 mg via ORAL
  Filled 2019-05-28: qty 1

## 2019-05-28 MED ORDER — LORAZEPAM 1 MG PO TABS: 1.0000 mg | ORAL_TABLET | Freq: Three times a day (TID) | ORAL | Status: DC | PRN

## 2019-05-28 MED ORDER — LORAZEPAM 1 MG PO TABS: 1.0000 mg | ORAL_TABLET | Freq: Four times a day (QID) | ORAL | Status: DC | PRN

## 2019-05-28 MED ORDER — BENAZEPRIL HCL 10 MG PO TABS
40.0000 mg | ORAL_TABLET | Freq: Every day | ORAL | Status: DC
  Administered 2019-05-28 – 2019-05-29 (×2): 40 mg via ORAL
  Filled 2019-05-28 (×2): qty 4

## 2019-05-28 MED ORDER — SODIUM CHLORIDE 0.9 % IV BOLUS
1000.0000 mL | Freq: Once | INTRAVENOUS | Status: AC
  Administered 2019-05-28: 1000 mL via INTRAVENOUS

## 2019-05-28 NOTE — ED Provider Notes (Signed)
Care assumed from Dr. Rogene Houston.  With intermittent left-sided chest pain with some shortness of breath.  History of hypertension, hyperlipidemia, diabetes.  Awaiting second troponin.  Chest pain has resolved  Patient did have recurrent episode of chest pain as well as hiccups.  Second troponin is negative. Labs show metabolic acidosis with anion gap 23.  Will hydrate  CT scan shows no evidence of aortic dissection.  Does show possible mild pancreatitis and pseudocyst. Outpatient MRI suggested.   Patient with persistent metabolic acidosis despite IV hydration.  Anion gap is improved from 23-17.  Large ketones in urine. Doubt DKA due to sugars running in the 100s to 200s.  Suspect likely alcoholic or starvation ketosis.  Patient with ongoing nausea and hiccups.  His acidosis is gradually improved with hydration but he still remains acidotic with elevated anion gap.  Doubt DKA but do suspect likely starvation ketosis.  We will continue IV fluids, hold IV insulin at this time.  Discussed with Dr. Darrick Meigs.  CRITICAL CARE Performed by: Ezequiel Essex Total critical care time: 35 minutes Critical care time was exclusive of separately billable procedures and treating other patients. Critical care was necessary to treat or prevent imminent or life-threatening deterioration. Critical care was time spent personally by me on the following activities: development of treatment plan with patient and/or surrogate as well as nursing, discussions with consultants, evaluation of patient's response to treatment, examination of patient, obtaining history from patient or surrogate, ordering and performing treatments and interventions, ordering and review of laboratory studies, ordering and review of radiographic studies, pulse oximetry and re-evaluation of patient's condition.  Stevon Gough was evaluated in Emergency Department on 05/28/2019 for the symptoms described in the history of present illness. He was  evaluated in the context of the global COVID-19 pandemic, which necessitated consideration that the patient might be at risk for infection with the SARS-CoV-2 virus that causes COVID-19. Institutional protocols and algorithms that pertain to the evaluation of patients at risk for COVID-19 are in a state of rapid change based on information released by regulatory bodies including the CDC and federal and state organizations. These policies and algorithms were followed during the patient's care in the ED.     EKG Interpretation  Date/Time:  Sunday May 28 2019 00:46:00 EDT Ventricular Rate:  79 PR Interval:    QRS Duration: 82 QT Interval:  375 QTC Calculation: 430 R Axis:   79 Text Interpretation:  Sinus rhythm Anteroseptal infarct, old No significant change was found Confirmed by Ezequiel Essex [phone]) on 05/28/2019 12:51:03 AM         Ezequiel Essex, MD 05/28/19 [phone]

## 2019-05-28 NOTE — H&P (Signed)
TRH H&P    Patient Demographics:    Todd Rodriguez, is a 43 y.o. male  MRN: [id number]  DOB - [birth year]  Admit Date - 05/27/2019  Referring MD/NP/PA: Ezequiel Essex  Outpatient Primary MD for the patient is Panosh, Standley Brooking, MD  Patient coming from: Home  Chief complaint-chest pain   HPI:    Todd Rodriguez  is a 43 y.o. male, with history of diabetes mellitus type 2, hypertension, pancreatitis, dyslipidemia, status post cervical spinal fusion, GERD came to hospital with epigastric pain for past 3 days.  Patient says that he felt nausea and was unable to eat much for past 3 days.  Patient usually drinks alcohol every day but has not been drinking for past 3 days.  He admits to being depressed since his back surgery and deaths in the family.  Patient says that he has lost about 60 pounds weight in past 3 years. He denies shortness of breath. Also had vomiting almost every day. Admits to having dark brown vomitus. In the ED patient was found to have positive urinary ketones, anion gap metabolic acidosis with anion gap of 23 initially which improved to 17 after IV fluids.  Blood glucose 179, 184, 168. Denies dysuria No previous history of stroke or seizures. He does take metformin at home. Lactic acid was 1.4    Review of systems:    In addition to the HPI above,    All other systems reviewed and are negative.    Past History of the following :    Past Medical History:  Diagnosis Date   Acute pancreatitis 10/25/2013   Arthritis    Diabetes mellitus without complication (HCC)    GERD (gastroesophageal reflux disease)    Hypertension    Myalgia and myositis 04/21/2014   much better from illness presumed infectious    has disability form  will complete     Pancreatitis       Past Surgical History:  Procedure Laterality Date   ANTERIOR CERVICAL DECOMP/DISCECTOMY FUSION N/A 01/12/2018   Procedure:  Revision ACDF C4-5, removal of hardware C5-6, exploration of fusion C5-6;  Surgeon: [name], MD;  Location: [place];  Service: Orthopedics;  Laterality: N/A;  3.5 hrs   NECK SURGERY     2 disc   SPINAL FUSION        Social History:      Social History   Tobacco Use   Smoking status: Current Every Day Smoker    Packs/day: 1.00    Types: Cigarettes   Smokeless tobacco: Never Used  Substance Use Topics   Alcohol use: Yes    Comment: every weekend       Family History :     Family History  Problem Relation Age of Onset   Diabetes Mother    Hypertension Mother    Diabetes Father    Hypertension Father    Arthritis Maternal Grandmother    Hypertension Maternal Grandfather    Diabetes Maternal Grandfather    Colon cancer Neg Hx    Pancreatitis Neg Hx    Pancreatic cancer Neg Hx  Home Medications:   Prior to Admission medications   Medication Sig Start Date End Date Taking? Authorizing Provider  albuterol (VENTOLIN HFA) 108 (90 Base) MCG/ACT inhaler TAKE 2 PUFFS BY MOUTH EVERY 6 HOURS AS NEEDED FOR WHEEZE OR SHORTNESS OF BREATH 05/04/19  Yes Panosh, Standley Brooking, MD  amLODipine-benazepril (LOTREL) 10-40 MG capsule TAKE 1 CAPSULE BY MOUTH EVERY DAY 04/12/19  Yes Panosh, Standley Brooking, MD  atorvastatin (LIPITOR) 40 MG tablet Take 1 tablet (40 mg total) by mouth daily. 03/23/19  Yes Panosh, Standley Brooking, MD  empagliflozin (JARDIANCE) 25 MG TABS tablet Take 25 mg by mouth daily. 05/10/19  Yes Shamleffer, Melanie Crazier, MD  gabapentin (NEURONTIN) 300 MG capsule Take 300 mg by mouth 3 (three) times daily.  04/12/19  Yes [provider]  glimepiride (AMARYL) 4 MG tablet Take 1.5 tablets by mouth every morning. 05/12/19  Yes [provider]  glipiZIDE (GLUCOTROL) 5 MG tablet Take 1 tablet (5 mg total) by mouth 2 (two) times daily before a meal. 05/10/19  Yes Shamleffer, Melanie Crazier, MD  metFORMIN (GLUCOPHAGE) 1000 MG tablet Take 1 tablet (1,000 mg total) by  mouth 2 (two) times daily with a meal. 05/10/19  Yes Shamleffer, Melanie Crazier, MD  nebivolol (BYSTOLIC) 5 MG tablet Take 1 tablet (5 mg total) by mouth daily. For high blood pressure 05/16/19  Yes Panosh, Standley Brooking, MD  pantoprazole (PROTONIX) 40 MG tablet TAKE 1 TABLET BY MOUTH TWICE A DAY (INS WILL ONLY PAY ONCE A DAY) 03/14/19  Yes Panosh, Standley Brooking, MD  citalopram (CELEXA) 20 MG tablet TAKE 10 MG BY MOUTH PER DAY FOR 1 WEEK , THEN INCREASE TO 20 MG BY MOUTH PER DAY Patient taking differently: Take 20 mg by mouth daily.  10/20/18   Panosh, Standley Brooking, MD  glucose blood (ONE TOUCH TEST STRIPS) test strip Use as instructed 06/25/15   Panosh, Standley Brooking, MD  LORazepam (ATIVAN) 1 MG tablet Take 1 tablet (1 mg total) by mouth every 8 (eight) hours as needed for anxiety. Panic attacks 05/24/18   Panosh, Standley Brooking, MD  naproxen (NAPROSYN) 500 MG tablet  05/04/19   [provider]  ondansetron (ZOFRAN ODT) 4 MG disintegrating tablet Take 1 tablet (4 mg total) by mouth every 8 (eight) hours as needed for nausea or vomiting. Patient not taking: Reported on 05/16/2019 01/12/18   Mayo, Darla Lesches, PA-C     Allergies:    No Known Allergies   Physical Exam:   Vitals  Blood pressure (!) 154/93, pulse (!) 103, temperature 98.6 F (37 C), temperature source Oral, resp. rate 17, height 6' 2"  (1.88 m), weight 81.6 kg, SpO2 100 %.  1.  General: Appears in no acute distress  2. Psychiatric: Alert, oriented x3, intact insight and judgment  3. Neurologic: Cranial nerves II through XII grossly intact, motor strength 5/5 in all extremities  4. HEENMT:  Atraumatic normocephalic  5. Respiratory : Clear to auscultation bilaterally  6. Cardiovascular : S1-S2, regular, no murmur auscultated  7. Gastrointestinal:  Abdomen is soft, nontender, no organomegaly.  Positive right CVA tenderness      Data Review:    CBC Recent Labs  Lab 05/27/19 2053  WBC 4.8  HGB 15.6  HCT 48.8  PLT 103*  MCV  103.2*  MCH 33.0  MCHC 32.0  RDW 12.2   ------------------------------------------------------------------------------------------------------------------  Results for orders placed or performed during the hospital encounter of 05/27/19 (from the past 48 hour(s))  Basic metabolic panel  Status: Abnormal   Collection Time: 05/27/19  8:53 PM  Result Value Ref Range   Sodium 136 135 - 145 mmol/L   Potassium 4.6 3.5 - 5.1 mmol/L   Chloride 98 98 - 111 mmol/L   CO2 15 (L) 22 - 32 mmol/L   Glucose, Bld 179 (H) 70 - 99 mg/dL   BUN 10 6 - 20 mg/dL   Creatinine, Ser 1.05 0.61 - 1.24 mg/dL   Calcium 9.8 8.9 - 10.3 mg/dL   GFR calc non Af Amer >60 >60 mL/min   GFR calc Af Amer >60 >60 mL/min   Anion gap 23 (H) 5 - 15    Comment: Performed at [facility], [address], [place], [place] [address]  CBC     Status: Abnormal   Collection Time: 05/27/19  8:53 PM  Result Value Ref Range   WBC 4.8 4.0 - 10.5 K/uL   RBC 4.73 4.22 - 5.81 MIL/uL   Hemoglobin 15.6 13.0 - 17.0 g/dL   HCT 48.8 39.0 - 52.0 %   MCV 103.2 (H) 80.0 - 100.0 fL   MCH 33.0 26.0 - 34.0 pg   MCHC 32.0 30.0 - 36.0 g/dL   RDW 12.2 11.5 - 15.5 %   Platelets 103 (L) 150 - 400 K/uL    Comment: SPECIMEN CHECKED FOR CLOTS   nRBC 0.0 0.0 - 0.2 %    Comment: Performed at [facility], [address] [place], [place] [address]  Troponin I (High Sensitivity)     Status: None   Collection Time: 05/27/19  8:53 PM  Result Value Ref Range   Troponin I (High Sensitivity) 4 <18 ng/L    Comment: (NOTE) Elevated high sensitivity troponin I (hsTnI) values and significant  changes across serial measurements may suggest ACS but many other  chronic and acute conditions are known to elevate hsTnI results.  Refer to the Links section for chest pain algorithms and additional  guidance. Performed at [facility], [address], [place], [place] [address]   D-dimer, quantitative (not at [facility])     Status: None   Collection Time: 05/27/19   8:55 PM  Result Value Ref Range   D-Dimer, Quant <0.27 0.00 - 0.50 ug/mL-FEU    Comment: (NOTE) At the manufacturer cut-off of 0.50 ug/mL FEU, this assay has been documented to exclude PE with a sensitivity and negative predictive value of 97 to 99%.  At this time, this assay has not been approved by the FDA to exclude DVT/VTE. Results should be correlated with clinical presentation. Performed at [facility], [address], [place], [place] [address]   Brain natriuretic peptide     Status: None   Collection Time: 05/27/19  8:55 PM  Result Value Ref Range   B Natriuretic Peptide 17.0 0.0 - 100.0 pg/mL    Comment: Performed at [facility], [address], [place],  [address]  Troponin I (High Sensitivity)     Status: None   Collection Time: 05/27/19 11:45 PM  Result Value Ref Range   Troponin I (High Sensitivity) 5 <18 ng/L    Comment: (NOTE) Elevated high sensitivity troponin I (hsTnI) values and significant  changes across serial measurements may suggest ACS but many other  chronic and acute conditions are known to elevate hsTnI results.  Refer to the "Links" section for chest pain algorithms and additional  guidance. Performed at [facility], [address], [place],  [address]   Comprehensive metabolic panel     Status:  Abnormal   Collection Time: 05/27/19 11:45 PM  Result Value Ref Range   Sodium 135 135 - 145 mmol/L   Potassium 5.1 3.5 - 5.1 mmol/L   Chloride 101 98 - 111 mmol/L   CO2 14 (L) 22 - 32 mmol/L   Glucose, Bld 184 (H) 70 - 99 mg/dL   BUN 9 6 - 20 mg/dL   Creatinine, Ser 0.97 0.61 - 1.24 mg/dL   Calcium 8.8 (L) 8.9 - 10.3 mg/dL   Total Protein 7.9 6.5 - 8.1 g/dL   Albumin 4.6 3.5 - 5.0 g/dL   AST 108 (H) 15 - 41 U/L   ALT 76 (H) 0 - 44 U/L   Alkaline Phosphatase 72 38 - 126 U/L   Total Bilirubin 3.3 (H) 0.3 - 1.2 mg/dL   GFR calc non Af Amer >60 >60 mL/min   GFR calc Af Amer >60 >60 mL/min   Anion gap 20 (H) 5 - 15    Comment: Performed at  [facility], [address] [place], [place] [address]  Lipase, blood     Status: None   Collection Time: 05/27/19 11:45 PM  Result Value Ref Range   Lipase 43 11 - 51 U/L    Comment: Performed at [facility], [address], [place], [place] [address]  CBG monitoring, ED     Status: Abnormal   Collection Time: 05/28/19 12:51 AM  Result Value Ref Range   Glucose-Capillary 184 (H) 70 - 99 mg/dL  Troponin I (High Sensitivity)     Status: None   Collection Time: 05/28/19  2:25 AM  Result Value Ref Range   Troponin I (High Sensitivity) 6 <18 ng/L    Comment: (NOTE) Elevated high sensitivity troponin I (hsTnI) values and significant  changes across serial measurements may suggest ACS but many other  chronic and acute conditions are known to elevate hsTnI results.  Refer to the "Links" section for chest pain algorithms and additional  guidance. Performed at [facility], [address], [place], [place] [address]   Basic metabolic panel     Status: Abnormal   Collection Time: 05/28/19  4:27 AM  Result Value Ref Range   Sodium 131 (L) 135 - 145 mmol/L   Potassium 4.7 3.5 - 5.1 mmol/L   Chloride 101 98 - 111 mmol/L   CO2 13 (L) 22 - 32 mmol/L   Glucose, Bld 168 (H) 70 - 99 mg/dL   BUN 7 6 - 20 mg/dL   Creatinine, Ser 0.88 0.61 - 1.24 mg/dL   Calcium 8.3 (L) 8.9 - 10.3 mg/dL   GFR calc non Af Amer >60 >60 mL/min   GFR calc Af Amer >60 >60 mL/min   Anion gap 17 (H) 5 - 15    Comment: Performed at [facility], [address], [place], [place] [address]  Blood gas, venous (at [facility], not at [facility])     Status: Abnormal   Collection Time: 05/28/19  4:27 AM  Result Value Ref Range   FIO2 21.00    pH, Ven 7.238 (L) 7.250 - 7.430   pCO2, Ven 30.8 (L) 44.0 - 60.0 mmHg   pO2, Ven 34.9 32.0 - 45.0 mmHg   Bicarbonate 13.6 (L) 20.0 - 28.0 mmol/L   Acid-base deficit 13.3 (H) 0.0 - 2.0 mmol/L   O2 Saturation 56.4 %   Patient temperature 37.0     Comment: Performed at [facility],  [address] [place], [place] [address]  Lactic acid, plasma  Status: None   Collection Time: 05/28/19  4:27 AM  Result Value Ref Range   Lactic Acid, Venous 1.4 0.5 - 1.9 mmol/L    Comment: Performed at [facility], [address], [place], [place] [address]  Urinalysis, Routine w reflex microscopic     Status: Abnormal   Collection Time: 05/28/19  4:46 AM  Result Value Ref Range   Color, Urine YELLOW YELLOW   APPearance CLEAR CLEAR   Specific Gravity, Urine 1.025 1.005 - 1.030   pH 6.0 5.0 - 8.0   Glucose, UA 50 (A) NEGATIVE mg/dL   Hgb urine dipstick SMALL (A) NEGATIVE   Bilirubin Urine NEGATIVE NEGATIVE   Ketones, ur 80 (A) NEGATIVE mg/dL   Protein, ur 100 (A) NEGATIVE mg/dL   Nitrite NEGATIVE NEGATIVE   Leukocytes,Ua NEGATIVE NEGATIVE   RBC / HPF 0-5 0 - 5 RBC/hpf   WBC, UA 0-5 0 - 5 WBC/hpf   Bacteria, UA NONE SEEN NONE SEEN   Mucus PRESENT     Comment: Performed at [facility], [address] [place], [place] [address]    Chemistries  Recent Labs  Lab 05/27/19 2053 05/27/19 2345 05/28/19 0427  NA 136 135 131*  K 4.6 5.1 4.7  CL 98 101 101  CO2 15* 14* 13*  GLUCOSE 179* 184* 168*  BUN 10 9 7   CREATININE 1.05 0.97 0.88  CALCIUM 9.8 8.8* 8.3*  AST  --  108*  --   ALT  --  76*  --   ALKPHOS  --  72  --   BILITOT  --  3.3*  --    ------------------------------------------------------------------------------------------------------------------  ------------------------------------------------------------------------------------------------------------------ GFR: Estimated Creatinine Clearance: 122.3 mL/min (by C-G formula based on SCr of 0.88 mg/dL). Liver Function Tests: Recent Labs  Lab 05/27/19 2345  AST 108*  ALT 76*  ALKPHOS 72  BILITOT 3.3*  PROT 7.9  ALBUMIN 4.6   Recent Labs  Lab 05/27/19 2345  LIPASE 43   CBG: Recent Labs  Lab 05/28/19 0051  GLUCAP 184*   Lipid Profile: No results for input(s): CHOL, HDL, LDLCALC, TRIG, CHOLHDL,  LDLDIRECT in the last 72 hours. Thyroid Function Tests: No results for input(s): TSH, T4TOTAL, FREET4, T3FREE, THYROIDAB in the last 72 hours. Anemia Panel: No results for input(s): VITAMINB12, FOLATE, FERRITIN, TIBC, IRON, RETICCTPCT in the last 72 hours.  --------------------------------------------------------------------------------------------------------------- Urine analysis:    Component Value Date/Time   COLORURINE YELLOW 05/28/2019 0446   APPEARANCEUR CLEAR 05/28/2019 0446   LABSPEC 1.025 05/28/2019 0446   PHURINE 6.0 05/28/2019 0446   GLUCOSEU 50 (A) 05/28/2019 0446   HGBUR SMALL (A) 05/28/2019 0446   BILIRUBINUR NEGATIVE 05/28/2019 0446   BILIRUBINUR n 01/01/2015 1112   KETONESUR 80 (A) 05/28/2019 0446   PROTEINUR 100 (A) 05/28/2019 0446   UROBILINOGEN 0.2 01/01/2015 1112   UROBILINOGEN 0.2 10/25/2013 1541   NITRITE NEGATIVE 05/28/2019 0446   LEUKOCYTESUR NEGATIVE 05/28/2019 0446      Imaging Results:    Dg Chest 2 View  Result Date: 05/27/2019 CLINICAL DATA:  Palpitations EXAM: CHEST - 2 VIEW COMPARISON:  09/12/2018 FINDINGS: Lungs are clear.  No pleural effusion or pneumothorax. The heart is normal in size. Visualized osseous structures are within normal limits. IMPRESSION: Normal chest radiographs. Electronically Signed   By: Julian Hy M.D.   On: 05/27/2019 18:56   Ct Angio Chest/abd/pel For Dissection W And/or Wo Contrast  Result Date: 05/28/2019 CLINICAL DATA:  Patient with chest pain. EXAM: CT ANGIOGRAPHY CHEST, ABDOMEN AND PELVIS  TECHNIQUE: Multidetector CT imaging through the chest, abdomen and pelvis was performed using the standard protocol during bolus administration of intravenous contrast. Multiplanar reconstructed images and MIPs were obtained and reviewed to evaluate the vascular anatomy. CONTRAST:  121m OMNIPAQUE IOHEXOL 350 MG/ML SOLN COMPARISON:  CT abdomen pelvis 10/24/2015. FINDINGS: CTA CHEST FINDINGS Cardiovascular: Noncontrast images  through the chest demonstrate no high attenuation within the thoracic aorta peripherally to suggest acute intramural hematoma. Thoracic aortic vascular calcifications. Post-contrast images demonstrate no thoracic aortic dissection. Takeoff of the great vessels are patent. Normal heart size. Trace fluid superior pericardial recess. Mediastinum/Nodes: No enlarged axillary, mediastinal or hilar lymphadenopathy. Normal appearance of the esophagus. Lungs/Pleura: Central airways are patent. No large area of pulmonary consolidation. No pleural effusion or pneumothorax. Musculoskeletal: No aggressive or acute appearing osseous lesions. Thoracic spine degenerative changes. Review of the MIP images confirms the above findings. CTA ABDOMEN AND PELVIS FINDINGS VASCULAR Aorta: Normal caliber abdominal aorta without evidence for acute dissection. Celiac: Patent without evidence of aneurysm, dissection, vasculitis or significant stenosis. SMA: Patent without evidence of aneurysm, dissection, vasculitis or significant stenosis. Renals: Both renal arteries are patent without evidence of aneurysm, dissection, vasculitis, fibromuscular dysplasia or significant stenosis. IMA: Patent without evidence of aneurysm, dissection, vasculitis or significant stenosis. Inflow: Patent without evidence of aneurysm, dissection, vasculitis or significant stenosis. Veins: No obvious venous abnormality within the limitations of this arterial phase study. Review of the MIP images confirms the above findings. NON-VASCULAR Hepatobiliary: Liver is diffusely low in attenuation compatible with hepatic steatosis. Within the central aspect of the liver there is a new 3.5 x 2.2 cm low-attenuation lesion. Gallbladder is unremarkable. No intrahepatic or extrahepatic biliary ductal dilatation. Pancreas: There is mild stranding about the pancreas. Within the region the pancreatic tail/splenic hilum there is a 6.3 x 5.1 cm cystic mass. Spleen: Unremarkable  Adrenals/Urinary Tract: Normal adrenal glands. Kidneys enhance symmetrically with contrast. No hydronephrosis. Urinary bladder is unremarkable. Stomach/Bowel: No abnormal bowel wall thickening or evidence for bowel obstruction. No free fluid or free intraperitoneal air. Normal morphology of the stomach. Lymphatic: No retroperitoneal lymphadenopathy. No pelvic lymphadenopathy. Reproductive: Unremarkable. Other: None. Musculoskeletal: No aggressive or acute appearing osseous lesions. Review of the MIP images confirms the above findings. IMPRESSION: 1. No evidence for acute thoracic or abdominal aortic dissection. 2. There is mild stranding about the pancreas raising the possibility of pancreatitis. 3. There is a new 6.3 cm cystic structure within the region the pancreatic tail/splenic hilum which may potentially represent a pseudocyst. 4. There is a new low-attenuation lesion within the central aspect of the liver which may represent hepatic cyst/fluid, more focal fatty deposition or potentially a true lesion. 5. In the nonacute setting, recommend further evaluation of both of these findings with abdominal MRI. 6. There is mild nonspecific fat stranding about the kidneys bilaterally. Recommend correlation with urinalysis to exclude the possibility of infection. 7. Extensive hepatic steatosis. Electronically Signed   By: DLovey NewcomerM.D.   On: 05/28/2019 04:28    My personal review of EKG: Rhythm NSR, no ST-T changes.   Assessment & Plan:    Active Problems:   Starvation ketoacidosis   1. Starvation ketoacidosis-patient has normal blood glucose, came with anion gap acidosis.  Initial anion gap was 23, improved to 17 after IV normal saline.  We will continue with normal saline.  Patient also takes metformin, lactic acid is 1.4.  Will hold metformin at this time.  2. Chest pain-pain mainly in epigastric region, CTA chest showed  no evidence of acute thoracic or abdominal aortic dissection.  Cardiac enzymes x2  are negative.  EKG is unremarkable  3. Abdominal pain-CT chest shows findings consistent with pancreatitis with pseudocyst.  Patient has minimal elevation of lipase and also minimally elevated LFTs.  Will obtain abdominal ultrasound, repeat LFTs.  Patient will be kept n.p.o., IV normal saline as above.  Repeat lipase in a.m.  4. ? UTI-CT shows nonspecific fat stranding about the kidneys bilaterally, patient also has right CVA tenderness.  UA is clear.  Will obtain urine culture.  Start empiric Rocephin.  Follow urine culture results.   5. Alcohol abuse- patient takes 3-4 drinks every day.  We will start CIWA protocol.   6. Diabetes mellitus type 2- we will start sliding scale insulin with NovoLog.  Hold oral hypoglycemic agents.  Check CBG every 6 hours.  7. Hypertension-continue Lotrel, Bystolic.    DVT Prophylaxis-   Lovenox   AM Labs Ordered, also please review Full Orders  Family Communication: Admission, patients condition and plan of care including tests being ordered have been discussed with the patient  who indicate understanding and agree with the plan and Code Status.  Code Status: Full code  Admission status: Inpatient: Based on patients clinical presentation and evaluation of above clinical data, I have made determination that patient meets Inpatient criteria at this time.  Time spent in minutes : 60 min   Oswald Hillock M.D on 05/28/2019 at 6:34 AM

## 2019-05-28 NOTE — Progress Notes (Signed)
Patient admitted to the hospital earlier this morning by Dr. Darrick Meigs  Patient seen and examined.  He continues to have abdominal pain (RUQ/epigastrium) that radiates to back. Feels generally weak.  Plan:  1. High anion gap metabolic acidosis.  Both be related to starvation ketoacidosis.  Started on IV fluids.  Lactic acid is normal.  Does not appear septic or toxic.  Will treat with bicarbonate infusion. 2. Chest pain.  Possibly related to gastritis.  CT chest did not show any evidence of dissection.  Cardiac enzymes have been negative. 3. Alcohol abuse.  Patient admits to drinking vodka on a daily basis.  He is not tremulous at this time.  Hemodynamically stable. Started on CIWA protocol. 4. Thrombocytopenia.  Suspect is related to alcohol abuse. 5. Hyperbilirubinemia.  No intrahepatic or extrahepatic ductal dilatation on CT scan.  Abdominal ultrasound in process.  Possibly related to dehydration.  Continue to follow. 6. Possible pancreatitis.  CT scan indicated possible stranding around the pancreas.  Lipase is normal.  He is having symptoms with abdominal pain.  Treat with IV fluids and pain management.  Recheck lipase in a.m.  Will try clear liquids. 7. Diabetes.  Chronically on metformin.  Will hold metformin for now.  Start on sliding scale insulin. 8. Hypertension.  Continued on Lotrel and Bystolic. 9. 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  Further work-up with MRI tomorrow. 10. Depression.  Continue on Celexa.  [employer]

## 2019-05-29 ENCOUNTER — Inpatient Hospital Stay (HOSPITAL_COMMUNITY): Payer: BC Managed Care – PPO

## 2019-05-29 ENCOUNTER — Encounter: Payer: Self-pay | Admitting: Neurology

## 2019-05-29 DIAGNOSIS — R1013 Epigastric pain: Secondary | ICD-10-CM

## 2019-05-29 DIAGNOSIS — D696 Thrombocytopenia, unspecified: Secondary | ICD-10-CM

## 2019-05-29 DIAGNOSIS — I10 Essential (primary) hypertension: Secondary | ICD-10-CM

## 2019-05-29 DIAGNOSIS — E43 Unspecified severe protein-calorie malnutrition: Secondary | ICD-10-CM | POA: Insufficient documentation

## 2019-05-29 DIAGNOSIS — E1165 Type 2 diabetes mellitus with hyperglycemia: Secondary | ICD-10-CM

## 2019-05-29 LAB — COMPREHENSIVE METABOLIC PANEL
ALT: 51 U/L — ABNORMAL HIGH (ref 0–44)
AST: 89 U/L — ABNORMAL HIGH (ref 15–41)
Albumin: 3.8 g/dL (ref 3.5–5.0)
Alkaline Phosphatase: 57 U/L (ref 38–126)
Anion gap: 15 (ref 5–15)
BUN: 7 mg/dL (ref 6–20)
CO2: 21 mmol/L — ABNORMAL LOW (ref 22–32)
Calcium: 8.8 mg/dL — ABNORMAL LOW (ref 8.9–10.3)
Chloride: 95 mmol/L — ABNORMAL LOW (ref 98–111)
Creatinine, Ser: 0.8 mg/dL (ref 0.61–1.24)
GFR calc Af Amer: >60 mL/min (ref >60)
GFR calc non Af Amer: >60 mL/min (ref >60)
Glucose, Bld: 282 mg/dL — ABNORMAL HIGH (ref 70–99)
Potassium: 3.3 mmol/L — ABNORMAL LOW (ref 3.5–5.1)
Sodium: 131 mmol/L — ABNORMAL LOW (ref 135–145)
Total Bilirubin: 2.4 mg/dL — ABNORMAL HIGH (ref 0.3–1.2)
Total Protein: 6.3 g/dL — ABNORMAL LOW (ref 6.5–8.1)

## 2019-05-29 LAB — CBC
HCT: 34.8 % — ABNORMAL LOW (ref 39.0–52.0)
Hemoglobin: 11.9 g/dL — ABNORMAL LOW (ref 13.0–17.0)
MCH: 33.3 pg (ref 26.0–34.0)
MCHC: 34.2 g/dL (ref 30.0–36.0)
MCV: 97.5 fL (ref 80.0–100.0)
Platelets: 68 10*3/uL — ABNORMAL LOW (ref 150–400)
RBC: 3.57 MIL/uL — ABNORMAL LOW (ref 4.22–5.81)
RDW: 11.8 % (ref 11.5–15.5)
WBC: 2.4 10*3/uL — ABNORMAL LOW (ref 4.0–10.5)
nRBC: 0 % (ref 0.0–0.2)

## 2019-05-29 LAB — GLUCOSE, CAPILLARY
Glucose-Capillary: 194 mg/dL — ABNORMAL HIGH (ref 70–99)
Glucose-Capillary: 198 mg/dL — ABNORMAL HIGH (ref 70–99)
Glucose-Capillary: 203 mg/dL — ABNORMAL HIGH (ref 70–99)
Glucose-Capillary: 218 mg/dL — ABNORMAL HIGH (ref 70–99)
Glucose-Capillary: 256 mg/dL — ABNORMAL HIGH (ref 70–99)

## 2019-05-29 LAB — MAGNESIUM: Magnesium: 1.8 mg/dL (ref 1.7–2.4)

## 2019-05-29 LAB — LIPASE, BLOOD: Lipase: 20 U/L (ref 11–51)

## 2019-05-29 LAB — HIV ANTIBODY (ROUTINE TESTING W REFLEX): HIV Screen 4th Generation wRfx: NONREACTIVE

## 2019-05-29 MED ORDER — POTASSIUM CHLORIDE CRYS ER 20 MEQ PO TBCR
40.0000 meq | EXTENDED_RELEASE_TABLET | Freq: Once | ORAL | Status: AC
  Administered 2019-05-29: 40 meq via ORAL
  Filled 2019-05-29: qty 2

## 2019-05-29 MED ORDER — POTASSIUM CHLORIDE IN NACL 40-0.9 MEQ/L-% IV SOLN
INTRAVENOUS | Status: DC
  Administered 2019-05-29: 100 mL/h via INTRAVENOUS

## 2019-05-29 MED ORDER — GADOBUTROL 1 MMOL/ML IV SOLN
10.0000 mL | Freq: Once | INTRAVENOUS | Status: AC | PRN
  Administered 2019-05-29: 7 mL via INTRAVENOUS

## 2019-05-29 NOTE — Progress Notes (Signed)
Inpatient Diabetes Program Recommendations  AACE/ADA: New Consensus Statement on Inpatient Glycemic Control   Target Ranges:  Prepandial:   less than 140 mg/dL      Peak postprandial:   less than 180 mg/dL (1-2 hours)      Critically ill patients:  140 - 180 mg/dL   Results for [facility], [place] (MRN [id number]) as of 05/29/2019 11:26  Ref. Range 05/28/2019 08:19 05/28/2019 11:38 05/28/2019 15:45 05/28/2019 17:19 05/29/2019 00:00 05/29/2019 05:25 05/29/2019 11:08  Glucose-Capillary Latest Ref Range: 70 - 99 mg/dL 209 (H) 130 (H) 264 (H) 121 (H) 203 (H) 256 (H) 198 (H)  Results for [facility], [place] (MRN [id number]) as of 05/29/2019 11:26  Ref. Range 03/21/2019 11:04 05/28/2019 07:52  Hemoglobin A1C Latest Ref Range: 4.8 - 5.6 % 9.5 (H) 6.3 (H)   Review of Glycemic Control  Diabetes history: DM2 Outpatient Diabetes medications: Jardiance 25 mg daily, Amaryl 6 mg QAM, Glipizide 5 mg BID, Metformin 1000 mg BID Current orders for Inpatient glycemic control: Novolog 0-9 units Q6H  Inpatient Diabetes Program Recommendations:   Insulin-Basal: Please consider ordering Lantus 8 units Q24H.  Insulin-Correction: If patient is eating well and tolerating diet please consider changing frequency of CBGs to AC&HS and Novolog 0-9 units TID with meals and Novolog 0-5 units QHS.  Thanks, Barnie Alderman, RN, MSN, CDE Diabetes Coordinator Inpatient Diabetes Program [phone] (Team Pager from 8am to 5pm)

## 2019-05-29 NOTE — Progress Notes (Signed)
Initial Nutrition Assessment  DOCUMENTATION CODES:   Severe malnutrition in context of acute illness/injury  INTERVENTION:  Nutrition therapy for People with Diabetes-handout   NUTRITION DIAGNOSIS:   Severe Malnutrition related to acute illness(starvation ketoacidosis) as evidenced by per patient/family report, energy intake < or equal to 50% for > or equal to 5 days, percent weight loss 6% in < 1 month.   GOAL:  Pt to meet >/= 90% of their estimated nutrition needs     MONITOR: Po intake, labs and wt trends    REASON FOR ASSESSMENT:      ASSESSMENT: Patient is a 43 yo male with history of DM-2 (A1C-6.3%), GERD, HTN, Pancreatitis who presents with abdominal pain, limited po intake 3 days prior to admission due to nausea and vomiting. Starvation ketoacidosis.  Chronic ETOH and tobacco abuse.   Patient diet fully advanced -patient tolerating well and says he is feeling much better today. At home- eats 2-3 meals daily but is in the habit of eating after midnight and then not hungry during the traditional time breakfast [place] typically consumed.  Provided handout: CHO counting for people with diabetes. Reviewed cho containing foods, serving sizes, importance of label reading and encouraged calorie free beverages.  He has an endocrinologist in the community. A significant drop in his A1C the past 2 months- which he associates with decrease in appetite. Expect he is malnourished based on acute and chronic wt loss and reported decreased intake.   Medications reviewed and include: folic acid, MVI, Thiamine, SSI  Review of weight history: acute loss of 6% < 1 month and 12% in 8 months and 16% loss compared to 1 year ago.  Labs: Glucose 282 mg/dl (H), Sodium 131 (L), Potassium 3.3 (L). CBG (last 3)  Recent Labs    05/29/19 0000 05/29/19 0525 05/29/19 1108  GLUCAP 203* 256* 198*     NUTRITION - FOCUSED PHYSICAL EXAM:  Unable to complete Nutrition-Focused physical exam at this time.     Diet Order:   Diet Order            Diet heart healthy/carb modified Room service appropriate? Yes; Fluid consistency: Thin  Diet effective now              EDUCATION NEEDS:   Skin:  Skin Assessment: Reviewed RN Assessment  Last BM:  8/19  Height:   Ht Readings from Last 1 Encounters:  05/28/19 6' 2"  (1.88 m)    Weight:   Wt Readings from Last 1 Encounters:  05/28/19 78.5 kg  Actual wt per RN  Ideal Body Weight:  86 kg  BMI:  Body mass index is 22.22 kg/m.  Estimated Nutritional Needs:   Kcal:  2212-2449  Protein:  95-103 gr protein  Fluid:  2.2-2.4 liters daily   Colman Cater MS,RD,CSG,LDN Office: #[phone] Pager: [phone]

## 2019-05-29 NOTE — TOC Initial Note (Signed)
Transition of Care [facility]) - Initial/Assessment Note    Patient Details  Name: Moss Berry MRN: [id number] Date of Birth: 1976/05/13  [facility]) CM/SW Contact:    Shade Flood, LCSW Phone Number: 05/29/2019, 10:41 AM  Clinical Narrative:                  [facility] informed by MD that pt requesting resources for ETOH treatment options. Spoke with pt to assess. Provided verbal and written information on outpatient resources in the [place] area at pt request. Emotional support provided. Encouraged pt to follow up with treatment. TOC will be available as needed.  Expected Discharge Plan: Home/Self Care Barriers to Discharge: Continued Medical Work up   Patient Goals and CMS Choice        Expected Discharge Plan and Services Expected Discharge Plan: Home/Self Care In-house Referral: Clinical Social Work     Living arrangements for the past 2 months: Single Family Home                                      Prior Living Arrangements/Services Living arrangements for the past 2 months: Single Family Home Lives with:: Spouse Patient language and need for interpreter reviewed:: Yes Do you feel safe going back to the place where you live?: Yes      Need for Family Participation in Patient Care: No (Comment) Care giver support system in place?: Yes (comment)   Criminal Activity/Legal Involvement Pertinent to Current Situation/Hospitalization: No - Comment as needed  Activities of Daily Living Home Assistive Devices/Equipment: None ADL Screening (condition at time of admission) Patient's cognitive ability adequate to safely complete daily activities?: Yes Is the patient deaf or have difficulty hearing?: No Does the patient have difficulty seeing, even when wearing glasses/contacts?: No Does the patient have difficulty concentrating, remembering, or making decisions?: No Patient able to express need for assistance with ADLs?: Yes Does the patient have  difficulty dressing or bathing?: No Independently performs ADLs?: Yes (appropriate for developmental age) Does the patient have difficulty walking or climbing stairs?: No Weakness of Legs: None Weakness of Arms/Hands: None  Permission Sought/Granted                  Emotional Assessment Appearance:: Appears stated age Attitude/Demeanor/Rapport: Gracious Affect (typically observed): Calm Orientation: : Oriented to Self, Oriented to Place, Oriented to  Time, Oriented to Situation Alcohol / Substance Use: Alcohol Use Psych Involvement: No (comment)  Admission diagnosis:  Precordial pain [I62.7] Metabolic acidosis [O35.0] Atypical chest pain [K93.81] Alcoholic ketosis [W29.9] Patient Active Problem List   Diagnosis Date Noted  . Starvation ketoacidosis 05/28/2019  . Alcohol abuse 05/28/2019  . Thrombocytopenia ([place]) 05/28/2019  . Pancreatic cyst 05/28/2019  . Hepatic lesion 05/28/2019  . Dyslipidemia 05/11/2019  . Abnormal LFTs 09/12/2018  . Medication management 09/12/2018  . 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 Carpal tunnel syndrome of left wrist 12/10/2017  . Cervical radiculopathy 11/11/2017  . History of fusion of cervical spine 10/22/2017  . Cramps, extremity 06/25/2015  . Diabetes mellitus type 2, uncontrolled, without complications ([place]) 37/16/9678  . Hand cramps 01/25/2015  . Fever, unspecified 03/26/2014  . Hx of acute pancreatitis 01/03/2014  . Tobacco dependence 01/03/2014  . Other and unspecified hyperlipidemia 01/03/2014  . GERD (gastroesophageal reflux disease) 11/22/2013  . Diabetes mellitus type 2, controlled, without complications (  [place]) 10/25/2013  . Essential hypertension, benign 10/25/2013  . Leukocytosis, unspecified 10/25/2013   PCP:  Burnis Medin, MD Pharmacy:   [id number] [id number]: [phone]: [phone]    Social Determinants of Health (SDOH) Interventions    Readmission Risk Interventions No flowsheet data found.

## 2019-05-29 NOTE — Progress Notes (Signed)
IV removed, 2x2 gauze and paper tape applied to site, patient tolerated well.  Reviewed AVS with patient and patient's wife both verbalized understanding.  Patient transported to lobby via wheelchair and taken home by his wife.

## 2019-05-29 NOTE — Discharge Summary (Signed)
Physician Discharge Summary  Todd Rodriguez EAV:[id number] DOB: 1976-04-24 DOA: 05/27/2019  PCP: Burnis Medin, MD  Admit date: 05/27/2019 Discharge date: 05/29/2019  Admitted From: Home Disposition: Home  Recommendations for Outpatient Follow-up:  1. Follow up with PCP in 1-2 weeks 2. Please obtain BMP/CBC in one week   Discharge Condition: Stable CODE STATUS: Full code Diet recommendation: Heart healthy, carb modified  Brief/Interim Summary: 43 year old male with a history of hypertension, diabetes, alcohol use, presents to the hospital with complaints of persistent nausea and vomiting as well as epigastric discomfort.  He was evaluated in the emergency room where he was noted to have significant high anion gap metabolic acidosis, ketones in his urine, dehydration with elevated creatinine.  Lactic acid was normal.  He was admitted for further treatments.  Discharge Diagnoses:  Active Problems:   Essential hypertension, benign   Diabetes mellitus type 2, uncontrolled, without complications (HCC)   Starvation ketoacidosis   Alcohol abuse   Thrombocytopenia (HCC)   Pancreatic cyst   Hepatic lesion   Protein-calorie malnutrition, severe  1. High anion gap metabolic acidosis.    Felt to be related to starvation ketoacidosis.    Improved with IV fluids.  Lactic acid is normal.  Does not appear septic or toxic.  2. Epigastric pain.  Possibly related to gastritis.  CT chest did not show any evidence of dissection.  Cardiac enzymes have been negative.  Treated with PPI and antiemetics.  Lipase normal 3. Alcohol abuse.  Patient admits to drinking vodka on a daily basis.  He was monitored in the hospital on CIWA protocol.  He did not have any signs of alcohol withdrawal. 4. Thrombocytopenia.  Suspect is related to alcohol abuse. 5. Hyperbilirubinemia.  No intrahepatic or extrahepatic ductal dilatation on CT scan.  Abdominal ultrasound showed hepatic steatosis.  He does not have any  tenderness in the right upper quadrant. 6. Possible pancreatitis.  CT scan indicated possible stranding around the pancreas.  Lipase is normal.    Patient was started on clear liquids and advance to solid food.  He was hydrated with IV fluids.  Clinically he is improved.  7. Diabetes.  He is chronically on metformin, glipizide and Jardiance.  A1c was 6.3.  We will continue home regimen on discharge 8. Hypertension.  Continued on Lotrel and Bystolic. 9. 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  No hepatic lesions noted on MRI. 10. Depression.  Continue on Celexa.  Discharge Instructions  Discharge Instructions    Diet - low sodium heart healthy   Complete by: As directed    Increase activity slowly   Complete by: As directed      Allergies as of 05/29/2019   No Known Allergies     Medication List    STOP taking these medications   glimepiride 4 MG tablet Commonly known as: AMARYL   naproxen 500 MG tablet Commonly known as: NAPROSYN     TAKE these medications   albuterol 108 (90 Base) MCG/ACT inhaler Commonly known as: VENTOLIN HFA TAKE 2 PUFFS BY MOUTH EVERY 6 HOURS AS NEEDED FOR WHEEZE OR SHORTNESS OF BREATH   amLODipine-benazepril 10-40 MG capsule Commonly known as: LOTREL TAKE 1 CAPSULE BY MOUTH EVERY DAY   atorvastatin 40 MG tablet Commonly known as: LIPITOR Take 1 tablet (40 mg total) by mouth daily.   citalopram 20 MG tablet Commonly known as: CELEXA TAKE 10 MG BY MOUTH PER DAY  FOR 1 WEEK , THEN INCREASE TO 20 MG BY MOUTH PER DAY What changed: See the new instructions.   empagliflozin 25 MG Tabs tablet Commonly known as: JARDIANCE Take 25 mg by mouth daily.   gabapentin 300 MG capsule Commonly known as: NEURONTIN Take 300 mg by mouth 3 (three) times daily.   glipiZIDE 5 MG tablet Commonly known as: GLUCOTROL Take 1 tablet (5 mg total) by mouth 2 (two) times daily before a  meal.   glucose blood test strip Commonly known as: ONE TOUCH TEST STRIPS Use as instructed   LORazepam 1 MG tablet Commonly known as: Ativan Take 1 tablet (1 mg total) by mouth every 8 (eight) hours as needed for anxiety. Panic attacks   metFORMIN 1000 MG tablet Commonly known as: GLUCOPHAGE Take 1 tablet (1,000 mg total) by mouth 2 (two) times daily with a meal.   nebivolol 5 MG tablet Commonly known as: Bystolic Take 1 tablet (5 mg total) by mouth daily. For high blood pressure   ondansetron 4 MG disintegrating tablet Commonly known as: Zofran ODT Take 1 tablet (4 mg total) by mouth every 8 (eight) hours as needed for nausea or vomiting.   pantoprazole 40 MG tablet Commonly known as: PROTONIX TAKE 1 TABLET BY MOUTH TWICE A DAY (INS WILL ONLY PAY ONCE A DAY)      Follow-up Information    Schedule an appointment as soon as possible for a visit  with Panosh, Standley Brooking, MD.   Specialties: Internal Medicine, Pediatrics Contact information: [address] [place] [id number] [phone]        Schedule an appointment as soon as possible for a visit  with [place].   Specialty: Cardiology Contact information: [place] [phone]         No Known Allergies  Consultations:     Procedures/Studies: Dg Chest 2 View  Result Date: 05/27/2019 CLINICAL DATA:  Palpitations EXAM: CHEST - 2 VIEW COMPARISON:  09/12/2018 FINDINGS: Lungs are clear.  No pleural effusion or pneumothorax. The heart is normal in size. Visualized osseous structures are within normal limits. IMPRESSION: Normal chest radiographs. Electronically Signed   By: Julian Hy M.D.   On: 05/27/2019 18:56   US Abdomen Complete  Result Date: 05/28/2019 CLINICAL DATA:  Pain in the mid abdomen. EXAM: ABDOMEN ULTRASOUND COMPLETE COMPARISON:  05/28/2019 CT FINDINGS: Gallbladder: Gallbladder has a normal appearance. Gallbladder wall is 2.4  millimeters, within normal limits. No stones or pericholecystic fluid. No sonographic Murphy's sign. Common bile duct: Diameter: 6.2 millimeters Liver: The liver is echogenic. There is attenuation of the ultrasound wave, poor visualization of the internal hepatic architecture, and loss of definition of the diaphragm. No focal liver lesions are identified. Portal vein is patent on color Doppler imaging with normal direction of blood flow towards the liver. The questioned abnormality identified within the central aspect of the liver is not identified sonographically. IVC: Patent. Pancreas: Pancreatic head and body are mildly heterogeneous without discrete mass. In the region the pancreatic tail/splenic hilum, there is a cystic mass measuring 6.3 x 5.7 x 5.7 centimeters. Spleen: Normal in size. Right Kidney: Length: 12.9 centimeters. Echogenicity within normal limits. No mass or hydronephrosis visualized. Left Kidney: Length: 11.8 centimeter. Echogenicity within normal limits. No mass or hydronephrosis visualized. Abdominal aorta: No aneurysm visualized. Other findings: None. IMPRESSION: 1. Hepatic steatosis. 2. Liver lesions suspected on CT is not identified sonographically. 3. Cystic mass in the region the pancreatic  tail or splenic hilum, 6.3 centimeters. 4. Nonemergent abdominal MRI has been recommended for evaluation of the liver and mass in the [place]. 5. No hydronephrosis. 6. No evidence for acute cholecystitis. Electronically Signed   By: Nolon Nations M.D.   On: 05/28/2019 13:34   Mr 3d Recon At Scanner  Result Date: 05/29/2019 CLINICAL DATA:  Follow-up pancreatic cyst/pseudocyst EXAM: MRI ABDOMEN WITHOUT AND WITH CONTRAST (INCLUDING MRCP) TECHNIQUE: Multiplanar multisequence MR imaging of the abdomen was performed both before and after the administration of intravenous contrast. Heavily T2-weighted images of the biliary and pancreatic ducts were obtained, and three-dimensional MRCP images  were rendered by post processing. CONTRAST:  7 mL Gadovist IV COMPARISON:  CTA chest abdomen pelvis dated 05/28/2019 FINDINGS: Motion degraded images. Lower chest: Lung bases are clear. Hepatobiliary: Heterogeneous/geographic moderate to severe hepatic steatosis. Differential hypoenhancement along the posterior aspect of the medial segment left hepatic lobe (series 22/image 33) likely corresponds to altered perfusion related to differential fat deposition in this region. This appearance is not considered suspicious for underlying lesion, although poorly evaluated due to motion degradation. Gallbladder is unremarkable. No intrahepatic or extrahepatic ductal dilatation. Pancreas: Mild fluid/stranding along the posterior aspect of the pancreatic tail/anterior left pararenal space, suggesting mild acute/resolving pancreatitis. 5.2 x 6.1 cm thick-walled fluid collection along the posterior aspect of the pancreatic tail, likely reflecting a pseudocyst. Spleen:  Within normal limits. Adrenals/Urinary Tract:  Adrenal glands are within normal limits. Kidneys are within normal limits.  No hydronephrosis. Stomach/Bowel: Stomach and visualized bowel are unremarkable. Vascular/Lymphatic:  No evidence of abdominal aortic aneurysm. No suspicious abdominal lymphadenopathy. Other:  No abdominal ascites. Musculoskeletal: No focal osseous lesions. IMPRESSION: Motion degraded images. 5.2 x 6.1 cm thick-walled fluid collection along the posterior aspect of the pancreatic tail, likely reflecting a pancreatic pseudocyst. Associated mild peripancreatic inflammatory changes, suggesting mild acute/resolving pancreatitis. Moderate to severe hepatic steatosis, heterogeneous/geographic. Associated differential hypoenhancement along the posterior aspect of the medial segment left hepatic lobe. This appearance is not considered suspicious for an underlying lesion. Electronically Signed   By: Julian Hy M.D.   On: 05/29/2019 15:42   Mr  Abdomen Mrcp Moise Boring Contast  Result Date: 05/29/2019 CLINICAL DATA:  Follow-up pancreatic cyst/pseudocyst EXAM: MRI ABDOMEN WITHOUT AND WITH CONTRAST (INCLUDING MRCP) TECHNIQUE: Multiplanar multisequence MR imaging of the abdomen was performed both before and after the administration of intravenous contrast. Heavily T2-weighted images of the biliary and pancreatic ducts were obtained, and three-dimensional MRCP images were rendered by post processing. CONTRAST:  7 mL Gadovist IV COMPARISON:  CTA chest abdomen pelvis dated 05/28/2019 FINDINGS: Motion degraded images. Lower chest: Lung bases are clear. Hepatobiliary: Heterogeneous/geographic moderate to severe hepatic steatosis. Differential hypoenhancement along the posterior aspect of the medial segment left hepatic lobe (series 22/image 33) likely corresponds to altered perfusion related to differential fat deposition in this region. This appearance is not considered suspicious for underlying lesion, although poorly evaluated due to motion degradation. Gallbladder is unremarkable. No intrahepatic or extrahepatic ductal dilatation. Pancreas: Mild fluid/stranding along the posterior aspect of the pancreatic tail/anterior left pararenal space, suggesting mild acute/resolving pancreatitis. 5.2 x 6.1 cm thick-walled fluid collection along the posterior aspect of the pancreatic tail, likely reflecting a pseudocyst. Spleen:  Within normal limits. Adrenals/Urinary Tract:  Adrenal glands are within normal limits. Kidneys are within normal limits.  No hydronephrosis. Stomach/Bowel: Stomach and visualized bowel are unremarkable. Vascular/Lymphatic:  No evidence of abdominal aortic aneurysm. No suspicious abdominal lymphadenopathy. Other:  No abdominal ascites. Musculoskeletal:  No focal osseous lesions. IMPRESSION: Motion degraded images. 5.2 x 6.1 cm thick-walled fluid collection along the posterior aspect of the pancreatic tail, likely reflecting a pancreatic pseudocyst.  Associated mild peripancreatic inflammatory changes, suggesting mild acute/resolving pancreatitis. Moderate to severe hepatic steatosis, heterogeneous/geographic. Associated differential hypoenhancement along the posterior aspect of the medial segment left hepatic lobe. This appearance is not considered suspicious for an underlying lesion. Electronically Signed   By: Julian Hy M.D.   On: 05/29/2019 15:42   Ct Angio Chest/abd/pel For Dissection W And/or Wo Contrast  Result Date: 05/28/2019 CLINICAL DATA:  Patient with chest pain. EXAM: CT ANGIOGRAPHY CHEST, ABDOMEN AND PELVIS TECHNIQUE: Multidetector CT imaging through the chest, abdomen and pelvis was performed using the standard protocol during bolus administration of intravenous contrast. Multiplanar reconstructed images and MIPs were obtained and reviewed to evaluate the vascular anatomy. CONTRAST:  191m OMNIPAQUE IOHEXOL 350 MG/ML SOLN COMPARISON:  CT abdomen pelvis 10/24/2015. FINDINGS: CTA CHEST FINDINGS Cardiovascular: Noncontrast images through the chest demonstrate no high attenuation within the thoracic aorta peripherally to suggest acute intramural hematoma. Thoracic aortic vascular calcifications. Post-contrast images demonstrate no thoracic aortic dissection. Takeoff of the great vessels are patent. Normal heart size. Trace fluid superior pericardial recess. Mediastinum/Nodes: No enlarged axillary, mediastinal or hilar lymphadenopathy. Normal appearance of the esophagus. Lungs/Pleura: Central airways are patent. No large area of pulmonary consolidation. No pleural effusion or pneumothorax. Musculoskeletal: No aggressive or acute appearing osseous lesions. Thoracic spine degenerative changes. Review of the MIP images confirms the above findings. CTA ABDOMEN AND PELVIS FINDINGS VASCULAR Aorta: Normal caliber abdominal aorta without evidence for acute dissection. Celiac: Patent without evidence of aneurysm, dissection, vasculitis or  significant stenosis. SMA: Patent without evidence of aneurysm, dissection, vasculitis or significant stenosis. Renals: Both renal arteries are patent without evidence of aneurysm, dissection, vasculitis, fibromuscular dysplasia or significant stenosis. IMA: Patent without evidence of aneurysm, dissection, vasculitis or significant stenosis. Inflow: Patent without evidence of aneurysm, dissection, vasculitis or significant stenosis. Veins: No obvious venous abnormality within the limitations of this arterial phase study. Review of the MIP images confirms the above findings. NON-VASCULAR Hepatobiliary: Liver is diffusely low in attenuation compatible with hepatic steatosis. Within the central aspect of the liver there is a new 3.5 x 2.2 cm low-attenuation lesion. Gallbladder is unremarkable. No intrahepatic or extrahepatic biliary ductal dilatation. Pancreas: There is mild stranding about the pancreas. Within the region the pancreatic tail/splenic hilum there is a 6.3 x 5.1 cm cystic mass. Spleen: Unremarkable Adrenals/Urinary Tract: Normal adrenal glands. Kidneys enhance symmetrically with contrast. No hydronephrosis. Urinary bladder is unremarkable. Stomach/Bowel: No abnormal bowel wall thickening or evidence for bowel obstruction. No free fluid or free intraperitoneal air. Normal morphology of the stomach. Lymphatic: No retroperitoneal lymphadenopathy. No pelvic lymphadenopathy. Reproductive: Unremarkable. Other: None. Musculoskeletal: No aggressive or acute appearing osseous lesions. Review of the MIP images confirms the above findings. IMPRESSION: 1. No evidence for acute thoracic or abdominal aortic dissection. 2. There is mild stranding about the pancreas raising the possibility of pancreatitis. 3. There is a new 6.3 cm cystic structure within the region the pancreatic tail/splenic hilum which may potentially represent a pseudocyst. 4. There is a new low-attenuation lesion within the central aspect of the  liver which may represent hepatic cyst/fluid, more focal fatty deposition or potentially a true lesion. 5. In the nonacute setting, recommend further evaluation of both of these findings with abdominal MRI. 6. There is mild nonspecific fat stranding about the kidneys bilaterally. Recommend correlation with urinalysis to  exclude the possibility of infection. 7. Extensive hepatic steatosis. Electronically Signed   By: Lovey Newcomer M.D.   On: 05/28/2019 04:28       Subjective: Feeling better today.  Not having any abdominal pain.  No nausea or vomiting.  Tolerating solid diet.  Discharge Exam: Vitals:   05/28/19 2043 05/28/19 2123 05/29/19 0524 05/29/19 1458  BP:  117/73 120/82 111/75  Pulse:  67 85 64  Resp:  17 17 16   Temp:  98 F (36.7 C) 98 F (36.7 C) 98 F (36.7 C)  TempSrc:    Oral  SpO2: 100% 100% 98% 100%  Weight:      Height:        General: Pt is alert, awake, not in acute distress Cardiovascular: RRR, S1/S2 +, no rubs, no gallops Respiratory: CTA bilaterally, no wheezing, no rhonchi Abdominal: Soft, NT, ND, bowel sounds + Extremities: no edema, no cyanosis    The results of significant diagnostics from this hospitalization (including imaging, microbiology, ancillary and laboratory) are listed below for reference.     Microbiology: Recent Results (from the past 240 hour(s))  SARS Coronavirus 2 [facility] order, Performed in [facility] hospital lab) Nasopharyngeal Nasopharyngeal Swab     Status: None   Collection Time: 05/28/19  6:03 AM   Specimen: Nasopharyngeal Swab  Result Value Ref Range Status   SARS Coronavirus 2 NEGATIVE NEGATIVE Final    Comment: (NOTE) If result is NEGATIVE SARS-CoV-2 target nucleic acids are NOT DETECTED. The SARS-CoV-2 RNA is generally detectable in upper and lower  respiratory specimens during the acute phase of infection. The lowest  concentration of SARS-CoV-2 viral copies this assay can detect is 250  copies / mL. A negative  result does not preclude SARS-CoV-2 infection  and should not be used as the sole basis for treatment or other  patient management decisions.  A negative result may occur with  improper specimen collection / handling, submission of specimen other  than nasopharyngeal swab, presence of viral mutation(s) within the  areas targeted by this assay, and inadequate number of viral copies  (<250 copies / mL). A negative result must be combined with clinical  observations, patient history, and epidemiological information. If result is POSITIVE SARS-CoV-2 target nucleic acids are DETECTED. The SARS-CoV-2 RNA is generally detectable in upper and lower  respiratory specimens dur ing the acute phase of infection.  Positive  results are indicative of active infection with SARS-CoV-2.  Clinical  correlation with patient history and other diagnostic information is  necessary to determine patient infection status.  Positive results do  not rule out bacterial infection or co-infection with other viruses. If result is PRESUMPTIVE POSTIVE SARS-CoV-2 nucleic acids MAY BE PRESENT.   A presumptive positive result was obtained on the submitted specimen  and confirmed on repeat testing.  While 2019 novel coronavirus  (SARS-CoV-2) nucleic acids may be present in the submitted sample  additional confirmatory testing may be necessary for epidemiological  and / or clinical management purposes  to differentiate between  SARS-CoV-2 and other Sarbecovirus currently known to infect humans.  If clinically indicated additional testing with an alternate test  methodology [phone]) is advised. The SARS-CoV-2 RNA is generally  detectable in upper and lower respiratory sp ecimens during the acute  phase of infection. The expected result is Negative. Fact Sheet for Patients:  StrictlyIdeas.no Fact Sheet for Healthcare Providers: BankingDealers.co.za This test is not yet  approved or cleared by the [place] FDA and has been authorized  for detection and/or diagnosis of SARS-CoV-2 by FDA under an Emergency Use Authorization (EUA).  This EUA will remain in effect (meaning this test can be used) for the duration of the COVID-19 declaration under Section 564(b)(1) of the Act, 21 U.S.C. section 360bbb-3(b)(1), unless the authorization is terminated or revoked sooner. Performed at [facility], [address] [place], [place] [address]      Labs: BNP (last 3 results) Recent Labs    05/27/19 2055  BNP 67.1   Basic Metabolic Panel: Recent Labs  Lab 05/27/19 2053 05/27/19 2345 05/28/19 0427 05/29/19 0626  NA 136 135 131* 131*  K 4.6 5.1 4.7 3.3*  CL 98 101 101 95*  CO2 15* 14* 13* 21*  GLUCOSE 179* 184* 168* 282*  BUN 10 9 7 7   CREATININE 1.05 0.97 0.88 0.80  CALCIUM 9.8 8.8* 8.3* 8.8*  MG  --   --   --  1.8   Liver Function Tests: Recent Labs  Lab 05/27/19 2345 05/28/19 0752 05/29/19 0626  AST 108* 71* 89*  ALT 76* 64* 51*  ALKPHOS 72 62 57  BILITOT 3.3* 2.5* 2.4*  PROT 7.9 7.2 6.3*  ALBUMIN 4.6 4.3 3.8   Recent Labs  Lab 05/27/19 2345 05/29/19 0626  LIPASE 43 20   No results for input(s): AMMONIA in the last 168 hours. CBC: Recent Labs  Lab 05/27/19 2053 05/29/19 0626  WBC 4.8 2.4*  HGB 15.6 11.9*  HCT 48.8 34.8*  MCV 103.2* 97.5  PLT 103* 68*   Cardiac Enzymes: No results for input(s): CKTOTAL, CKMB, CKMBINDEX, TROPONINI in the last 168 hours. BNP: Invalid input(s): POCBNP CBG: Recent Labs  Lab 05/28/19 1719 05/29/19 0000 05/29/19 0525 05/29/19 1108 05/29/19 1613  GLUCAP 121* 203* 256* 198* 194*   D-Dimer Recent Labs    05/27/19 2055  DDIMER <0.27   Hgb A1c Recent Labs    05/28/19 0752  HGBA1C 6.3*   Lipid Profile No results for input(s): CHOL, HDL, LDLCALC, TRIG, CHOLHDL, LDLDIRECT in the last 72 hours. Thyroid function studies No results for input(s): TSH, T4TOTAL, T3FREE, THYROIDAB in the  last 72 hours.  Invalid input(s): FREET3 Anemia work up No results for input(s): VITAMINB12, FOLATE, FERRITIN, TIBC, IRON, RETICCTPCT in the last 72 hours. Urinalysis    Component Value Date/Time   COLORURINE YELLOW 05/28/2019 0446   APPEARANCEUR CLEAR 05/28/2019 0446   LABSPEC 1.025 05/28/2019 0446   PHURINE 6.0 05/28/2019 0446   GLUCOSEU 50 (A) 05/28/2019 0446   HGBUR SMALL (A) 05/28/2019 0446   BILIRUBINUR NEGATIVE 05/28/2019 0446   BILIRUBINUR n 01/01/2015 1112   KETONESUR 80 (A) 05/28/2019 0446   PROTEINUR 100 (A) 05/28/2019 0446   UROBILINOGEN 0.2 01/01/2015 1112   UROBILINOGEN 0.2 10/25/2013 1541   NITRITE NEGATIVE 05/28/2019 0446   LEUKOCYTESUR NEGATIVE 05/28/2019 0446   Sepsis Labs Invalid input(s): PROCALCITONIN,  WBC,  LACTICIDVEN Microbiology Recent Results (from the past 240 hour(s))  SARS Coronavirus 2 [facility] order, Performed in [facility] hospital lab) Nasopharyngeal Nasopharyngeal Swab     Status: None   Collection Time: 05/28/19  6:03 AM   Specimen: Nasopharyngeal Swab  Result Value Ref Range Status   SARS Coronavirus 2 NEGATIVE NEGATIVE Final    Comment: (NOTE) If result is NEGATIVE SARS-CoV-2 target nucleic acids are NOT DETECTED. The SARS-CoV-2 RNA is generally detectable in upper and lower  respiratory specimens during the acute phase of infection. The lowest  concentration of SARS-CoV-2 viral copies this assay can detect is 250  copies / mL. A negative result does not preclude SARS-CoV-2 infection  and should not be used as the sole basis for treatment or other  patient management decisions.  A negative result may occur with  improper specimen collection / handling, submission of specimen other  than nasopharyngeal swab, presence of viral mutation(s) within the  areas targeted by this assay, and inadequate number of viral copies  (<250 copies / mL). A negative result must be combined with clinical  observations, patient history, and  epidemiological information. If result is POSITIVE SARS-CoV-2 target nucleic acids are DETECTED. The SARS-CoV-2 RNA is generally detectable in upper and lower  respiratory specimens dur ing the acute phase of infection.  Positive  results are indicative of active infection with SARS-CoV-2.  Clinical  correlation with patient history and other diagnostic information is  necessary to determine patient infection status.  Positive results do  not rule out bacterial infection or co-infection with other viruses. If result is PRESUMPTIVE POSTIVE SARS-CoV-2 nucleic acids MAY BE PRESENT.   A presumptive positive result was obtained on the submitted specimen  and confirmed on repeat testing.  While 2019 novel coronavirus  (SARS-CoV-2) nucleic acids may be present in the submitted sample  additional confirmatory testing may be necessary for epidemiological  and / or clinical management purposes  to differentiate between  SARS-CoV-2 and other Sarbecovirus currently known to infect humans.  If clinically indicated additional testing with an alternate test  methodology [phone]) is advised. The SARS-CoV-2 RNA is generally  detectable in upper and lower respiratory sp ecimens during the acute  phase of infection. The expected result is Negative. Fact Sheet for Patients:  StrictlyIdeas.no Fact Sheet for Healthcare Providers: BankingDealers.co.za This test is not yet approved or cleared by the [place] FDA and has been authorized for detection and/or diagnosis of SARS-CoV-2 by FDA under an Emergency Use Authorization (EUA).  This EUA will remain in effect (meaning this test can be used) for the duration of the COVID-19 declaration under Section 564(b)(1) of the Act, 21 U.S.C. section 360bbb-3(b)(1), unless the authorization is terminated or revoked sooner. Performed at [facility], [address] [place], [place] [address]      Time  coordinating discharge: 54mns  SIGNED:   JKathie Dike MD  [facility] 05/29/2019, 4:42 PM   If 7PM-7AM, please contact night-coverage [email or web address]

## 2019-05-30 LAB — URINE CULTURE: Culture: NO GROWTH

## 2019-05-31 NOTE — Progress Notes (Signed)
Chief Complaint  Patient presents with  . Hospitalization Follow-up    HPI: Todd Rodriguez 43 y.o. come in for fu hospitalization .   Was d/ced 2 days ago   See dc summary below.  Had dehydration and acidosis  Pancreatitis  Reported on hosp  record said  daily etoh ( albeit  He denied to me at last visit)  H presented with tachycardia and chest pain  But feels a whole lot better  Now  Has a cardiology appt after weekend   Lipase was normal through out hosp   Had  Acidosis and  Dehydration and was rx but meds not changes x  To glucotrol  But didn't take off metformin or jardiance    lfts abd  Scans were abnormal    abd mri mod to sever hepatic steatosis    a1c was 6.3 at hosp but had hyperglycemia   To see cards  On Monday   depression seems stable .  Has had ony one etoh for a birthday time  Still tobacco   ROS: See pertinent positives and negatives per HPI.  Past Medical History:  Diagnosis Date  . Acute pancreatitis 10/25/2013  . Arthritis   . Diabetes mellitus without complication ([place])   . GERD (gastroesophageal reflux disease)   . Hypertension   . Myalgia and myositis 04/21/2014   much better from illness presumed infectious    has disability form  will complete    . Pancreatitis     Family History  Problem Relation Age of Onset  . Diabetes Mother   . Hypertension Mother   . Diabetes Father   . Hypertension Father   . Arthritis Maternal Grandmother   . Hypertension Maternal Grandfather   . Diabetes Maternal Grandfather   . Colon cancer Neg Hx   . Pancreatitis Neg Hx   . Pancreatic cancer Neg Hx     Social History   Socioeconomic History  . Marital status: Married    Spouse name: Not on file  . Number of children: 1  . Years of education: Not on file  . Highest education level: Not on file  Occupational History    Employer: [employer]  . Financial resource strain: Not on file  . Food insecurity    Worry: Not on file   Inability: Not on file  . Transportation needs    Medical: Not on file    Non-medical: Not on file  Tobacco Use  . Smoking status: Current Every Day Smoker    Packs/day: 1.00    Types: Cigarettes  . Smokeless tobacco: Never Used  Substance and Sexual Activity  . Alcohol use: Yes    Comment: every weekend  . Drug use: No  . Sexual activity: Yes    Partners: Female    Comment: Patient's Wife  Lifestyle  . Physical activity    Days per week: Not on file    Minutes per session: Not on file  . Stress: Not on file  Relationships  . Social Herbalist on phone: Not on file    Gets together: Not on file    Attends religious service: Not on file    Active member of club or organization: Not on file    Attends meetings of clubs or organizations: Not on file    Relationship status: Not on file  Other Topics Concern  . 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 albuterol (VENTOLIN HFA) 108 (90 Base) MCG/ACT inhaler TAKE 2 PUFFS BY MOUTH EVERY 6 HOURS AS NEEDED FOR WHEEZE OR SHORTNESS OF BREATH 1 g 1  . amLODipine-benazepril (LOTREL) 10-40 MG capsule TAKE 1 CAPSULE BY MOUTH EVERY DAY 30 capsule 2  . atorvastatin (LIPITOR) 40 MG tablet Take 1 tablet (40 mg total) by mouth daily. 90 tablet 1  . citalopram (CELEXA) 20 MG tablet TAKE 10 MG BY MOUTH PER DAY FOR 1 WEEK , THEN INCREASE TO 20 MG BY MOUTH PER DAY (Patient taking differently: Take 20 mg by mouth daily. ) 24 tablet 0  . empagliflozin (JARDIANCE) 25 MG TABS tablet Take 25 mg by mouth daily. 30 tablet 6  . gabapentin (NEURONTIN) 300 MG capsule Take 300 mg by mouth 3 (three) times daily.     Marland Kitchen  glipiZIDE (GLUCOTROL) 5 MG tablet Take 1 tablet (5 mg total) by mouth 2 (two) times daily before a meal. 60 tablet 3  . glucose blood (ONE TOUCH TEST STRIPS) test strip Use as instructed 100 each 11  . LORazepam (ATIVAN) 1 MG tablet Take 1 tablet (1 mg total) by mouth every 8 (eight) hours as needed for anxiety. Panic attacks 20 tablet 0  . metFORMIN (GLUCOPHAGE) 1000 MG tablet Take 1 tablet (1,000 mg total) by mouth 2 (two) times daily with a meal. 180 tablet 3  . nebivolol (BYSTOLIC) 5 MG tablet Take 1 tablet (5 mg total) by mouth daily. For high blood pressure 30 tablet 1  . pantoprazole (PROTONIX) 40 MG tablet TAKE 1 TABLET BY MOUTH TWICE A DAY (INS WILL ONLY PAY ONCE A DAY) 30 tablet 5  . ondansetron (ZOFRAN ODT) 4 MG disintegrating tablet Take 1 tablet (4 mg total) by mouth every 8 (eight) hours as needed for nausea or vomiting. (Patient not taking: Reported on 06/02/2019) 20 tablet 0   No facility-administered medications prior to visit.      EXAM:  BP 120/78   Pulse 85   Temp 98 F (36.7 C) (Oral)   Ht 6' 2"  (1.88 m)   Wt 173 lb (78.5 kg)   SpO2 97%   BMI 22.21 kg/m   Body mass index is 22.21 kg/m.  GENERAL: vitals reviewed and listed above, alert, oriented, appears well hydrated and in no acute distress looks more relaxed and well  HEENT: atraumatic, conjunctiva  clear, no obvious abnormalities on inspection of external nose and ears  NECK: no obvious masses on inspection palpation  LUNGS: clear to auscultation bilaterally, no wheezes, rales ocass tubular sound left   CV: HRRR, no clubbing cyanosis or  peripheral edema nl cap refill  abd  No pain g r   Liver at rcm   Skin no petechiae    Venipuncture   Area left arm no other bruises  PSYCH: pleasant and cooperative, no obvious depression or anxiety Lab Results  Component Value Date   WBC 2.4 (L) 05/29/2019   HGB 11.9 (L) 05/29/2019   HCT 34.8 (L) 05/29/2019   PLT 68 (L) 05/29/2019  GLUCOSE 282 (H) 05/29/2019   CHOL  275 (H) 03/21/2019   TRIG 386.0 (H) 03/21/2019   HDL 35.30 (L) 03/21/2019   LDLDIRECT 193.0 03/21/2019   LDLCALC 216 (H) 07/19/2018   ALT 51 (H) 05/29/2019   AST 89 (H) 05/29/2019   NA 131 (L) 05/29/2019   K 3.3 (L) 05/29/2019   CL 95 (L) 05/29/2019   CREATININE 0.80 05/29/2019   BUN 7 05/29/2019   CO2 21 (L) 05/29/2019   TSH 1.03 03/21/2019   PSA 0.76 01/07/2016   HGBA1C 6.3 (H) 05/28/2019   MICROALBUR 2.4 (H) 03/21/2019   BP Readings from Last 3 Encounters:  06/02/19 120/78  05/29/19 111/75  05/10/19 (!) 158/102   Wt Readings from Last 3 Encounters:  06/02/19 173 lb (78.5 kg)  05/28/19 173 lb 1 oz (78.5 kg)  05/16/19 185 lb (83.9 kg)     ASSESSMENT AND PLAN:  Discussed the following assessment and plan:  Hospital discharge follow-up  Medication management - Plan: Basic metabolic panel, Hepatic function panel, CBC with Differential/Platelet, Lipid panel  Diabetes mellitus type 2, uncontrolled, without complications ([place]) - Plan: Basic metabolic panel, Hepatic function panel, CBC with Differential/Platelet, Lipid panel  Transaminitis - Plan: Basic metabolic panel, Hepatic function panel, CBC with Differential/Platelet, Lipid panel, Ambulatory referral to Gastroenterology  Pancreatic pseudocyst - Plan: Basic metabolic panel, Hepatic function panel, CBC with Differential/Platelet, Lipid panel, Ambulatory referral to Gastroenterology  Hepatic steatosis - Plan: Ambulatory referral to Gastroenterology  Pancytopenia ([place]) ? - med  etoh related? metabolic derangement  From above    Fu chem and blood count     bg better    ? If stopping the jardiance cause of habove hs   alcohol use  But not sure how much meds could be adding to the situation   a1c is good now but  ? bg to begin monitoring   Disc   Advise if dehydrated that jardiance  And or metformin  Can cause acid build up if  Dehydrated   plan pseudocyst    And liver abnormality  Plan fu labs  And  Need gi to help follow    Pancytopenia    Many causes possible  But low plt prob from etoh   plan ful cbc as planned  -Patient advised to return or notify health care team  if  new concerns arise. In interim   Patient Instructions   Health Maintenance Due  Topic Date Due  . OPHTHALMOLOGY EXAM  03/22/1984  . FOOT EXAM  12/17/2017  . TETANUS/TDAP  11/05/2018  . INFLUENZA VACCINE  05/06/2019    Depression screen [facility] 2/9 04/17/2019 01/25/2015  Decreased Interest 0 0  Down, Depressed, Hopeless 0 0  PHQ - 2 Score 0 0  I am going to refer you to the [facility] department because of concern of the liver  inflammaiton  Findings and the pancreatic pseudocyst.   IF you are getting in any way dehydrated    May need to hold to stop the jardiance and metformin temporarily Be alcohol free for at least the next month  .   Keep your cardiology appt.   Plan lab tests in 10 - 14  Days   or as needed if not done elsewhere  Make lab appt   Then will plan fu visit with me  Glad  You are feeling better .  Flu shot in the fall .     IMPRESSION: Motion degraded images.  5.2 x 6.1 cm thick-walled  fluid collection along the posterior aspect of the pancreatic tail, likely reflecting a pancreatic pseudocyst. Associated mild peripancreatic inflammatory changes, suggesting mild acute/resolving pancreatitis.  Moderate to severe hepatic steatosis, heterogeneous/geographic. Associated differential hypoenhancement along the posterior aspect of the medial segment left hepatic lobe. This appearance is not considered suspicious for an underlying lesion.   Electronically Signed   By: Julian Hy M.D.   On: 05/29/2019 15:42    Standley Brooking. Panosh M.D.  Admit date: 05/27/2019 Discharge date: 05/29/2019  Admitted From: Home Disposition: Home  Recommendations for Outpatient Follow-up:  1. Follow up with PCP in 1-2 weeks 2. Please obtain BMP/CBC in one week   Discharge Condition: Stable CODE STATUS: Full code Diet  recommendation: Heart healthy, carb modified  Brief/Interim Summary: 44 year old male with a history of hypertension, diabetes, alcohol use, presents to the hospital with complaints of persistent nausea and vomiting as well as epigastric discomfort.  He was evaluated in the emergency room where he was noted to have significant high anion gap metabolic acidosis, ketones in his urine, dehydration with elevated creatinine.  Lactic acid was normal.  He was admitted for further treatments.  Discharge Diagnoses:  Active Problems:   Essential hypertension, benign   Diabetes mellitus type 2, uncontrolled, without complications (HCC)   Starvation ketoacidosis   Alcohol abuse   Thrombocytopenia (HCC)   Pancreatic cyst   Hepatic lesion   Protein-calorie malnutrition, severe  1. High anion gap metabolic acidosis.   Felt to be related to starvation ketoacidosis.   Improved with IV fluids. Lactic acid is normal. Does not appear septic or toxic.  2. Epigastric pain. Possibly related to gastritis. CT chest did not show any evidence of dissection. Cardiac enzymes have been negative.  Treated with PPI and antiemetics.  Lipase normal 3. Alcohol abuse. Patient admits to drinking vodka on a daily basis.  He was monitored in the hospital on CIWA protocol.  He did not have any signs of alcohol withdrawal. 4. Thrombocytopenia. Suspect is related to alcohol abuse. 5. Hyperbilirubinemia. No intrahepatic or extrahepatic ductal dilatation on CT scan.  Abdominal ultrasound showed hepatic steatosis.  He does not have any tenderness in the right upper quadrant. 6. Possible pancreatitis. CT scan indicated possible stranding around the pancreas. Lipase is normal.   Patient was started on clear liquids and advance to solid food.  He was hydrated with IV fluids.  Clinically he is improved. 7. Diabetes. He is chronically on metformin, glipizide and Jardiance.  A1c was 6.3.  We will continue home regimen on  discharge 8. Hypertension. Continued on Lotrel and Bystolic. 9. 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  No hepatic lesions noted on MRI. 10. Depression. Continue on Celexa.  Discharge Instructions

## 2019-06-02 ENCOUNTER — Encounter: Payer: Self-pay | Admitting: Internal Medicine

## 2019-06-02 ENCOUNTER — Other Ambulatory Visit: Payer: Self-pay

## 2019-06-02 ENCOUNTER — Ambulatory Visit: Payer: BC Managed Care – PPO | Admitting: Internal Medicine

## 2019-06-02 VITALS — BP 120/78 | HR 85 | Temp 98.0°F | Ht 74.0 in | Wt 173.0 lb

## 2019-06-02 DIAGNOSIS — Z79899 Other long term (current) drug therapy: Secondary | ICD-10-CM | POA: Diagnosis not present

## 2019-06-02 DIAGNOSIS — E1165 Type 2 diabetes mellitus with hyperglycemia: Secondary | ICD-10-CM | POA: Diagnosis not present

## 2019-06-02 DIAGNOSIS — R740 Nonspecific elevation of levels of transaminase and lactic acid dehydrogenase [LDH]: Secondary | ICD-10-CM

## 2019-06-02 DIAGNOSIS — IMO0001 Reserved for inherently not codable concepts without codable children: Secondary | ICD-10-CM

## 2019-06-02 DIAGNOSIS — Z09 Encounter for follow-up examination after completed treatment for conditions other than malignant neoplasm: Secondary | ICD-10-CM

## 2019-06-02 DIAGNOSIS — R7401 Elevation of levels of liver transaminase levels: Secondary | ICD-10-CM

## 2019-06-02 DIAGNOSIS — D61818 Other pancytopenia: Secondary | ICD-10-CM

## 2019-06-02 DIAGNOSIS — K863 Pseudocyst of pancreas: Secondary | ICD-10-CM

## 2019-06-02 DIAGNOSIS — K760 Fatty (change of) liver, not elsewhere classified: Secondary | ICD-10-CM

## 2019-06-02 NOTE — Patient Instructions (Addendum)
Health Maintenance Due  Topic Date Due  . OPHTHALMOLOGY EXAM  03/22/1984  . FOOT EXAM  12/17/2017  . TETANUS/TDAP  11/05/2018  . INFLUENZA VACCINE  05/06/2019    Depression screen [facility] 2/9 04/17/2019 01/25/2015  Decreased Interest 0 0  Down, Depressed, Hopeless 0 0  PHQ - 2 Score 0 0  I am going to refer you to the [facility] department because of concern of the liver  inflammaiton  Findings and the pancreatic pseudocyst.   IF you are getting in any way dehydrated    May need to hold to stop the jardiance and metformin temporarily Be alcohol free for at least the next month  .   Keep your cardiology appt.   Plan lab tests in 10 - 14  Days   or as needed if not done elsewhere  Make lab appt   Then will plan fu visit with me  Glad  You are feeling better .  Flu shot in the fall .     IMPRESSION: Motion degraded images.  5.2 x 6.1 cm thick-walled fluid collection along the posterior aspect of the pancreatic tail, likely reflecting a pancreatic pseudocyst. Associated mild peripancreatic inflammatory changes, suggesting mild acute/resolving pancreatitis.  Moderate to severe hepatic steatosis, heterogeneous/geographic. Associated differential hypoenhancement along the posterior aspect of the medial segment left hepatic lobe. This appearance is not considered suspicious for an underlying lesion.   Electronically Signed   By: Julian Hy M.D.   On: 05/29/2019 15:42

## 2019-06-05 ENCOUNTER — Other Ambulatory Visit: Payer: Self-pay

## 2019-06-05 ENCOUNTER — Ambulatory Visit: Payer: BC Managed Care – PPO | Admitting: Cardiovascular Disease

## 2019-06-05 ENCOUNTER — Encounter: Payer: Self-pay | Admitting: Cardiovascular Disease

## 2019-06-05 ENCOUNTER — Encounter: Payer: Self-pay | Admitting: Gastroenterology

## 2019-06-05 VITALS — BP 119/77 | HR 87 | Temp 97.3°F | Ht 74.0 in | Wt 181.0 lb

## 2019-06-05 DIAGNOSIS — R000 Tachycardia, unspecified: Secondary | ICD-10-CM | POA: Diagnosis not present

## 2019-06-05 DIAGNOSIS — R079 Chest pain, unspecified: Secondary | ICD-10-CM | POA: Diagnosis not present

## 2019-06-05 NOTE — Progress Notes (Signed)
CARDIOLOGY CONSULT NOTE  Patient ID: Todd Rodriguez MRN: [id number] DOB/AGE: Jun 14, 1976 43 y.o.  Admit date: (Not on file) Primary Physician: Burnis Medin, MD  Reason for Consultation: Chest pain and tachycardia  HPI: Todd Rodriguez is a 43 y.o. male who is being seen today for the evaluation of chest pain and tachycardia at the request of Panosh, Standley Brooking, MD.   Past medical history includes hypertension, type 2 diabetes mellitus, and alcohol use.  He was recently hospitalized for persistent nausea and vomiting and epigastric discomfort.  He was found to have a high anion gap metabolic acidosis, ketonuria, and dehydration.  It was felt to be secondary to starvation ketoacidosis and he was treated with IV fluids.  He was not found to be septic.  He had been drinking vodka on a daily basis.  He was found to have thrombocytopenia felt secondary to alcohol abuse.  There is some question of possible pancreatitis.  I personally reviewed the ECG performed on 05/27/2019 which demonstrated sinus tachycardia, 124 bpm.  He is feeling much better now and denies chest pain, palpitations, shortness of breath, leg swelling, lightheadedness, dizziness, and syncope.  Family history: Father died of an MI at age 83.  He had diabetes.  His mother may have had a "small heart attack" in the past.   No Known Allergies  Current Outpatient Medications  Medication Sig Dispense Refill  . albuterol (VENTOLIN HFA) 108 (90 Base) MCG/ACT inhaler TAKE 2 PUFFS BY MOUTH EVERY 6 HOURS AS NEEDED FOR WHEEZE OR SHORTNESS OF BREATH 1 g 1  . amLODipine-benazepril (LOTREL) 10-40 MG capsule TAKE 1 CAPSULE BY MOUTH EVERY DAY 30 capsule 2  . atorvastatin (LIPITOR) 40 MG tablet Take 1 tablet (40 mg total) by mouth daily. 90 tablet 1  . citalopram (CELEXA) 20 MG tablet TAKE 10 MG BY MOUTH PER DAY FOR 1 WEEK , THEN INCREASE TO 20 MG BY MOUTH PER DAY (Patient taking differently: Take 20 mg by mouth daily. ) 24 tablet 0   . empagliflozin (JARDIANCE) 25 MG TABS tablet Take 25 mg by mouth daily. 30 tablet 6  . gabapentin (NEURONTIN) 300 MG capsule Take 300 mg by mouth 3 (three) times daily.     Marland Kitchen glipiZIDE (GLUCOTROL) 5 MG tablet Take 1 tablet (5 mg total) by mouth 2 (two) times daily before a meal. 60 tablet 3  . glucose blood (ONE TOUCH TEST STRIPS) test strip Use as instructed 100 each 11  . LORazepam (ATIVAN) 1 MG tablet Take 1 tablet (1 mg total) by mouth every 8 (eight) hours as needed for anxiety. Panic attacks 20 tablet 0  . metFORMIN (GLUCOPHAGE) 1000 MG tablet Take 1 tablet (1,000 mg total) by mouth 2 (two) times daily with a meal. 180 tablet 3  . nebivolol (BYSTOLIC) 5 MG tablet Take 1 tablet (5 mg total) by mouth daily. For high blood pressure 30 tablet 1  . ondansetron (ZOFRAN ODT) 4 MG disintegrating tablet Take 1 tablet (4 mg total) by mouth every 8 (eight) hours as needed for nausea or vomiting. 20 tablet 0  . pantoprazole (PROTONIX) 40 MG tablet TAKE 1 TABLET BY MOUTH TWICE A DAY (INS WILL ONLY PAY ONCE A DAY) 30 tablet 5   No current facility-administered medications for this visit.     Past Medical History:  Diagnosis Date  . Acute pancreatitis 10/25/2013  . Arthritis   . Diabetes mellitus without complication ([place])   . GERD (gastroesophageal reflux disease)   .  Hypertension   . Myalgia and myositis 04/21/2014   much better from illness presumed infectious    has disability form  will complete    . Pancreatitis     Past Surgical History:  Procedure Laterality Date  . ANTERIOR CERVICAL DECOMP/DISCECTOMY FUSION N/A 01/12/2018   Procedure: Revision ACDF C4-5, removal of hardware C5-6, exploration of fusion C5-6;  Surgeon: [name], MD;  Location: [facility];  Service: Orthopedics;  Laterality: N/A;  3.5 hrs  . NECK SURGERY     2 disc  . SPINAL FUSION      Social History   Socioeconomic History  . Marital status: Married    Spouse name: Not on file  . Number of children: 1  . Years  of education: Not on file  . Highest education level: Not on file  Occupational History    Employer: [employer]  . Financial resource strain: Not on file  . Food insecurity    Worry: Not on file    Inability: Not on file  . Transportation needs    Medical: Not on file    Non-medical: Not on file  Tobacco Use  . Smoking status: Current Every Day Smoker    Packs/day: 1.00    Types: Cigarettes  . Smokeless tobacco: Never Used  Substance and Sexual Activity  . Alcohol use: Yes    Comment: every weekend  . Drug use: No  . Sexual activity: Yes    Partners: Female    Comment: Patient's Wife  Lifestyle  . Physical activity    Days per week: Not on file    Minutes per session: Not on file  . Stress: Not on file  Relationships  . Social Herbalist on phone: Not on file    Gets together: Not on file    Attends religious service: Not on file    Active member of club or organization: Not on file    Attends meetings of clubs or organizations: Not on file    Relationship status: Not on file  . Intimate partner violence    Fear of current or ex partner: Not on file    Emotionally abused: Not on file    Physically abused: Not on file    Forced sexual activity: Not on file  Other Topics Concern  . Not on file  Social History Narrative   5 hours of sleep    2 people living in the home   A dog living in the home   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   Eye exam nany clark    FA tobacco some etoh neg rd exercises      Plays drummer in a band                Current Meds  Medication Sig  . albuterol (VENTOLIN HFA) 108 (90 Base) MCG/ACT inhaler TAKE 2 PUFFS BY MOUTH EVERY 6 HOURS AS NEEDED FOR WHEEZE OR SHORTNESS OF BREATH  . amLODipine-benazepril (LOTREL) 10-40 MG capsule TAKE 1 CAPSULE BY MOUTH EVERY DAY  . atorvastatin (LIPITOR) 40 MG tablet Take 1 tablet (40 mg total) by mouth daily.  . citalopram (CELEXA)  20 MG tablet TAKE 10 MG BY MOUTH PER DAY FOR 1 WEEK , THEN INCREASE TO 20 MG BY MOUTH PER DAY (Patient taking differently: Take 20 mg by mouth daily. )  . empagliflozin (JARDIANCE) 25 MG TABS tablet Take 25 mg by mouth  daily.  . gabapentin (NEURONTIN) 300 MG capsule Take 300 mg by mouth 3 (three) times daily.   Marland Kitchen glipiZIDE (GLUCOTROL) 5 MG tablet Take 1 tablet (5 mg total) by mouth 2 (two) times daily before a meal.  . glucose blood (ONE TOUCH TEST STRIPS) test strip Use as instructed  . LORazepam (ATIVAN) 1 MG tablet Take 1 tablet (1 mg total) by mouth every 8 (eight) hours as needed for anxiety. Panic attacks  . metFORMIN (GLUCOPHAGE) 1000 MG tablet Take 1 tablet (1,000 mg total) by mouth 2 (two) times daily with a meal.  . nebivolol (BYSTOLIC) 5 MG tablet Take 1 tablet (5 mg total) by mouth daily. For high blood pressure  . ondansetron (ZOFRAN ODT) 4 MG disintegrating tablet Take 1 tablet (4 mg total) by mouth every 8 (eight) hours as needed for nausea or vomiting.  . pantoprazole (PROTONIX) 40 MG tablet TAKE 1 TABLET BY MOUTH TWICE A DAY (INS WILL ONLY PAY ONCE A DAY)      Review of systems complete and found to be negative unless listed above in HPI    Physical exam Blood pressure 119/77, pulse 87, temperature (!) 97.3 F (36.3 C), height 6' 2"  (1.88 m), weight 181 lb (82.1 kg), SpO2 99 %. General: NAD Neck: No JVD, no thyromegaly or thyroid nodule.  Lungs: Clear to auscultation bilaterally with normal respiratory effort. CV: Nondisplaced PMI. Regular rate and rhythm, normal S1/S2, no S3/S4, no murmur.  No peripheral edema.  No carotid bruit.    Abdomen: Soft, nontender, no distention.  Skin: Intact without lesions or rashes.  Neurologic: Alert and oriented x 3.  Psych: Normal affect. Extremities: No clubbing or cyanosis.  HEENT: Normal.   ECG: Most recent ECG reviewed.   Labs: Lab Results  Component Value Date/Time   K 3.3 (L) 05/29/2019 06:26 AM   BUN 7 05/29/2019 06:26  AM   CREATININE 0.80 05/29/2019 06:26 AM   ALT 51 (H) 05/29/2019 06:26 AM   TSH 1.03 03/21/2019 11:04 AM   HGB 11.9 (L) 05/29/2019 06:26 AM     Lipids: Lab Results  Component Value Date/Time   LDLCALC 216 (H) 07/19/2018 03:37 PM   LDLDIRECT 193.0 03/21/2019 11:04 AM   CHOL 275 (H) 03/21/2019 11:04 AM   TRIG 386.0 (H) 03/21/2019 11:04 AM   HDL 35.30 (L) 03/21/2019 11:04 AM        ASSESSMENT AND PLAN:  1.  Chest pain and tachycardia: Symptoms have completely resolved.  Cardiovascular exam is normal.  I informed him should he have any symptom recurrence to contact me and cardiac testing could be considered at that time.     Disposition: Follow up as needed  Signed: Kate Sable, M.D., F.A.C.C.  06/05/2019, 4:05 PM

## 2019-06-05 NOTE — Patient Instructions (Signed)
Medication Instructions: Your physician recommends that you continue on your current medications as directed. Please refer to the Current Medication list given to you today.   Labwork: [place]  Procedures/Testing: None  Follow-Up: As needed with Dr.Koneswaran  Any Additional Special Instructions Will Be Listed Below (If Applicable).      Thank you for choosing [facility] !

## 2019-06-15 ENCOUNTER — Encounter: Payer: Self-pay | Admitting: Neurology

## 2019-06-16 ENCOUNTER — Other Ambulatory Visit: Payer: BC Managed Care – PPO

## 2019-06-19 ENCOUNTER — Other Ambulatory Visit: Payer: Self-pay

## 2019-06-19 ENCOUNTER — Encounter: Payer: Self-pay | Admitting: Neurology

## 2019-06-19 ENCOUNTER — Ambulatory Visit: Payer: BC Managed Care – PPO | Admitting: Neurology

## 2019-06-19 ENCOUNTER — Other Ambulatory Visit (INDEPENDENT_AMBULATORY_CARE_PROVIDER_SITE_OTHER): Payer: BC Managed Care – PPO

## 2019-06-19 VITALS — BP 120/82 | HR 78 | Ht 74.0 in | Wt 180.0 lb

## 2019-06-19 DIAGNOSIS — E1165 Type 2 diabetes mellitus with hyperglycemia: Secondary | ICD-10-CM | POA: Diagnosis not present

## 2019-06-19 DIAGNOSIS — K863 Pseudocyst of pancreas: Secondary | ICD-10-CM

## 2019-06-19 DIAGNOSIS — G629 Polyneuropathy, unspecified: Secondary | ICD-10-CM

## 2019-06-19 DIAGNOSIS — R7401 Elevation of levels of liver transaminase levels: Secondary | ICD-10-CM

## 2019-06-19 DIAGNOSIS — R945 Abnormal results of liver function studies: Secondary | ICD-10-CM

## 2019-06-19 DIAGNOSIS — Z79899 Other long term (current) drug therapy: Secondary | ICD-10-CM

## 2019-06-19 DIAGNOSIS — R740 Nonspecific elevation of levels of transaminase and lactic acid dehydrogenase [LDH]: Secondary | ICD-10-CM

## 2019-06-19 DIAGNOSIS — I10 Essential (primary) hypertension: Secondary | ICD-10-CM

## 2019-06-19 DIAGNOSIS — R7989 Other specified abnormal findings of blood chemistry: Secondary | ICD-10-CM

## 2019-06-19 DIAGNOSIS — G959 Disease of spinal cord, unspecified: Secondary | ICD-10-CM | POA: Diagnosis not present

## 2019-06-19 DIAGNOSIS — IMO0001 Reserved for inherently not codable concepts without codable children: Secondary | ICD-10-CM

## 2019-06-19 LAB — CBC WITH DIFFERENTIAL/PLATELET
Basophils Absolute: 0.1 10*3/uL (ref 0.0–0.1)
Basophils Relative: 1.3 % (ref 0.0–3.0)
Eosinophils Absolute: 0.1 10*3/uL (ref 0.0–0.7)
Eosinophils Relative: 1.1 % (ref 0.0–5.0)
HCT: 42.6 % (ref 39.0–52.0)
Hemoglobin: 14.4 g/dL (ref 13.0–17.0)
Lymphocytes Relative: 24.7 % (ref 12.0–46.0)
Lymphs Abs: 1.1 10*3/uL (ref 0.7–4.0)
MCHC: 33.9 g/dL (ref 30.0–36.0)
MCV: 97.8 fl (ref 78.0–100.0)
Monocytes Absolute: 0.4 10*3/uL (ref 0.1–1.0)
Monocytes Relative: 9.4 % (ref 3.0–12.0)
Neutro Abs: 2.9 10*3/uL (ref 1.4–7.7)
Neutrophils Relative %: 63.5 % (ref 43.0–77.0)
Platelets: 368 10*3/uL (ref 150.0–400.0)
RBC: 4.36 Mil/uL (ref 4.22–5.81)
RDW: 13.7 % (ref 11.5–15.5)
WBC: 4.6 10*3/uL (ref 4.0–10.5)

## 2019-06-19 LAB — LIPID PANEL
Cholesterol: 176 mg/dL (ref 0–200)
HDL: 80 mg/dL (ref >39.00)
LDL Cholesterol: 79 mg/dL (ref 0–99)
NonHDL: 96.17
Total CHOL/HDL Ratio: 2
Triglycerides: 85 mg/dL (ref 0.0–149.0)
VLDL: 17 mg/dL (ref 0.0–40.0)

## 2019-06-19 LAB — HEMOGLOBIN A1C: Hgb A1c MFr Bld: 8.5 % — ABNORMAL HIGH (ref 4.6–6.5)

## 2019-06-19 LAB — BASIC METABOLIC PANEL
BUN: 7 mg/dL (ref 6–23)
CO2: 25 mEq/L (ref 19–32)
Calcium: 9.3 mg/dL (ref 8.4–10.5)
Chloride: 97 mEq/L (ref 96–112)
Creatinine, Ser: 0.7 mg/dL (ref 0.40–1.50)
GFR: 147.4 mL/min (ref >60.00)
Glucose, Bld: 78 mg/dL (ref 70–99)
Potassium: 4.6 mEq/L (ref 3.5–5.1)
Sodium: 140 mEq/L (ref 135–145)

## 2019-06-19 LAB — HEPATIC FUNCTION PANEL
ALT: 20 U/L (ref 0–53)
AST: 31 U/L (ref 0–37)
Albumin: 4.2 g/dL (ref 3.5–5.2)
Alkaline Phosphatase: 78 U/L (ref 39–117)
Bilirubin, Direct: 0.2 mg/dL (ref 0.0–0.3)
Total Bilirubin: 0.5 mg/dL (ref 0.2–1.2)
Total Protein: 6.8 g/dL (ref 6.0–8.3)

## 2019-06-19 NOTE — Progress Notes (Signed)
[facility] Clinic Note - Initial Visit   Date: 06/19/19  Todd Rodriguez MRN: [id number] DOB: Jun 18, 1976   Dear Dr.Panosh:  Thank you for your kind referral of Todd Rodriguez for consultation of right hand paresthesias. Although his history is well known to you, please allow [place] to reiterate it for the purpose of our medical record. The patient was accompanied to the clinic by self.   History of Present Illness: Todd Rodriguez is a 43 y.o. 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   Starting around the fall of 2018, he began having numbness in both arms, worse on the left.  He was also having significant neck pain.  He did not have any benefit with physical therapy.  He has MRI cervical spine in March 2019 which showed severe central canal stenosis at C4-5 with myelomalacia, as well as severe biforaminal stenosis at this level and at C7-T1 and C3-4.  In April 2019, he underwent ACDF C4-5 and exploration of previous fusion at C5-6 by Dr.Brooks.   Following surgery, his neck pain improved, but there was no change in his arm and hand numbness/tingling.  He denies any weakness of the arms.  He drops object frequently. He had MRI cervical spine following his surgery, but I do not have this to review.  He also had NCS/EMG of the arms prior to surgery by Dr. Nelva Bush, the report is not available and will be requested.  Starting around 2 weeks ago, he began having numbness involving the feet and soles of the feet.  He drinks alcohol on the weekend 2-3 drinks (12oz beer), but upon review of epic notes, he was hospitalized in August for persistent nausea, vomiting due to starvation ketoacidosis from drinking vodka daily.  There was concern with the pancreatitis and was also found to have thrombocytopenia, secondary to alcohol abuse.  He is  on disability since July 2020.  He was last working in September 2018 at [employer].    Out-side paper records, electronic medical record, and images have been reviewed where available and summarized as:  MRI cervical spine 12/07/2017 performed at [facility]: 1.  Severe C4-5 central canal stenosis with focal myelomalacia seen spanning 12 mm of the cord in cranial caudal dimension centered at C4-5.  Severe right and moderate-severe left neuroforaminal and subarticular zone narrowing impinging upon coursing and exiting C5 nerve roots more pronounced on the right. 2.  Severe left and moderate right C7-T1 subarticular zone narrowing and moderate-severe left and moderate right neuroforaminal narrowing impinging upon the coursing and exiting left C8 nerve roots 3.  Moderate-severe left and moderate right C3-4 neuroforaminal narrowing with moderate left subarticular zone narrowing impinging upon exiting left C4 nerve roots.  Mild-moderate central canal stenosis with mild flattening of the cord. 4.  Postsurgical changes at C5-6 without central canal stenosis.  Moderate bilateral subarticular zone narrowing without neural foraminal narrowing likely displacing and somewhat impinging upon exiting C6 nerve roots. 5.  Postsurgical changes at C6-7 without spinal canal stenosis.  Mild-moderate bilateral neuroforaminal narrowing and mild narrowing of the subarticular zones. 6.  Mild-moderate left T2-3 neural foraminal narrowing  Lab Results  Component Value Date   HGBA1C 8.5 (H) 06/19/2019   No results found for: VITAMINB12 Lab Results  Component Value Date   TSH 1.03 03/21/2019   Lab Results  Component Value Date   ESRSEDRATE 50 (H) 10/26/2013  Past Medical History:  Diagnosis Date  . Acute pancreatitis 10/25/2013  . Arthritis   . Diabetes mellitus without complication ([place])   . GERD (gastroesophageal reflux disease)   . Hypertension   . Myalgia and myositis 04/21/2014   much better from  illness presumed infectious    has disability form  will complete    . Pancreatitis     Past Surgical History:  Procedure Laterality Date  . ANTERIOR CERVICAL DECOMP/DISCECTOMY FUSION N/A 01/12/2018   Procedure: Revision ACDF C4-5, removal of hardware C5-6, exploration of fusion C5-6;  Surgeon: [name], MD;  Location: [facility];  Service: Orthopedics;  Laterality: N/A;  3.5 hrs  . NECK SURGERY     2 disc  . SPINAL FUSION       Medications:  Outpatient Encounter Medications as of 06/19/2019  Medication Sig  . albuterol (VENTOLIN HFA) 108 (90 Base) MCG/ACT inhaler TAKE 2 PUFFS BY MOUTH EVERY 6 HOURS AS NEEDED FOR WHEEZE OR SHORTNESS OF BREATH  . amLODipine-benazepril (LOTREL) 10-40 MG capsule TAKE 1 CAPSULE BY MOUTH EVERY DAY  . atorvastatin (LIPITOR) 40 MG tablet Take 1 tablet (40 mg total) by mouth daily.  . citalopram (CELEXA) 20 MG tablet TAKE 10 MG BY MOUTH PER DAY FOR 1 WEEK , THEN INCREASE TO 20 MG BY MOUTH PER DAY (Patient taking differently: Take 20 mg by mouth daily. )  . empagliflozin (JARDIANCE) 25 MG TABS tablet Take 25 mg by mouth daily.  Marland Kitchen gabapentin (NEURONTIN) 300 MG capsule Take 300 mg by mouth 3 (three) times daily.   Marland Kitchen glipiZIDE (GLUCOTROL) 5 MG tablet Take 1 tablet (5 mg total) by mouth 2 (two) times daily before a meal.  . glucose blood (ONE TOUCH TEST STRIPS) test strip Use as instructed  . LORazepam (ATIVAN) 1 MG tablet Take 1 tablet (1 mg total) by mouth every 8 (eight) hours as needed for anxiety. Panic attacks  . metFORMIN (GLUCOPHAGE) 1000 MG tablet Take 1 tablet (1,000 mg total) by mouth 2 (two) times daily with a meal.  . nebivolol (BYSTOLIC) 5 MG tablet Take 1 tablet (5 mg total) by mouth daily. For high blood pressure  . ondansetron (ZOFRAN ODT) 4 MG disintegrating tablet Take 1 tablet (4 mg total) by mouth every 8 (eight) hours as needed for nausea or vomiting.  . pantoprazole (PROTONIX) 40 MG tablet TAKE 1 TABLET BY MOUTH TWICE A DAY (INS WILL ONLY PAY  ONCE A DAY)   No facility-administered encounter medications on file as of 06/19/2019.     Allergies: No Known Allergies  Family History: Family History  Problem Relation Age of Onset  . Diabetes Mother   . Hypertension Mother   . Diabetes Father   . Hypertension Father   . Arthritis Maternal Grandmother   . Hypertension Maternal Grandfather   . Diabetes Maternal Grandfather   . Colon cancer Neg Hx   . Pancreatitis Neg Hx   . Pancreatic cancer Neg Hx     Social History: Social History   Tobacco Use  . Smoking status: Current Every Day Smoker    Packs/day: 1.00    Types: Cigarettes  . Smokeless tobacco: Never Used  Substance Use Topics  . Alcohol use: Yes    Comment: every weekend  . 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   EYES: No visual changes or eye pain ENT: No hearing changes.  No history of nose bleeds.   RESPIRATORY: No cough, wheezing and shortness of breath.   CARDIOVASCULAR: Negative for chest pain, and palpitations.   GI: Negative for abdominal discomfort, blood in stools or black stools.  No recent change in bowel habits.   GU:  No history of incontinence.   MUSCLOSKELETAL: No history of joint pain or swelling.  No myalgias.   SKIN: Negative for lesions, rash, and itching.   HEMATOLOGY/ONCOLOGY: Negative for prolonged bleeding, bruising easily, and swollen nodes.  No history of cancer.   ENDOCRINE: Negative for cold or heat intolerance, polydipsia or goiter.   PSYCH:  +depression or anxiety symptoms.   NEURO: As Above.   Vital Signs:  BP 120/82   Pulse 78   Ht 6' 2"  (1.88 m)    Wt 180 lb (81.6 kg)   SpO2 96%   BMI 23.11 kg/m    General Medical Exam:   General:  Well appearing, comfortable.   Eyes/ENT: see cranial nerve examination.   Neck:   No carotid bruits. Respiratory:  Clear to auscultation, good air entry bilaterally.   Cardiac:  Regular rate and rhythm, no murmur.   Extremities:  No deformities, edema, or skin discoloration.  Distal hair loss in the lower legs Skin:  No rashes or lesions.  Neurological Exam: MENTAL STATUS including orientation to time, place, person, recent and remote memory, attention span and concentration, language, and fund of knowledge is normal.  Speech is not dysarthric.  CRANIAL NERVES: II:  No visual field defects.   III-IV-VI: Pupils equal round and reactive to light.  Normal conjugate, extra-ocular eye movements in all directions of gaze.  No nystagmus.  No ptosis.   V:  Normal facial sensation.    VII:  Normal facial symmetry and movements.   VIII:  Normal hearing and vestibular function.   IX-X:  Normal palatal movement.   XI:  Normal shoulder shrug and head rotation.   XII:  Normal tongue strength and range of motion, no deviation or fasciculation.  MOTOR:  No atrophy, fasciculations or abnormal movements.  No pronator drift.   Upper Extremity:  Right  Left  Deltoid  5/5   5/5   Biceps  5/5   5/5   Triceps  5/5   5/5   Infraspinatus 5/5  5/5  Medial pectoralis 5/5  5/5  Wrist extensors  5/5   5/5   Wrist flexors  5/5   5/5   Finger extensors  5/5   5/5   Finger flexors  5/5   5/5   Dorsal interossei  4+/5   5/5   Abductor pollicis  5/5   5/5   Tone (Ashworth scale)  0  0   Lower Extremity:  Right  Left  Hip flexors  5/5   5/5   Hip extensors  5/5   5/5   Adductor 5/5  5/5  Abductor 5/5  5/5  Knee flexors  5/5   5/5   Knee extensors  5/5   5/5   Dorsiflexors  5/5  5/5   Plantarflexors  5/5   5/5   Toe extensors  5/5   5/5   Toe flexors  5/5   5/5   Tone (Ashworth scale)  0  0   MSRs:  Right         Left                  brachioradialis 2+  2+  biceps 1+  1+  triceps 2+  2+  patellar 2+  2+  ankle jerk 0  0  Hoffman no  no  plantar response down  down   SENSORY: Vibration is reduced at the ankles and absent at the great toe bilaterally.  Temperature is mildly reduced over the toes.  Pinprick is intact throughout.  Vibration, temperature, and pinprick is intact in the upper extremities. Romberg's sign positive.   COORDINATION/GAIT: Normal finger-to- nose-finger.  Finger tapping is slowed on the right, intact on the left.  Gait appears mildly wide-based, stable and unassisted.  With some difficulty, he is able to her perform stressed and tandem gait.   IMPRESSION: Bilateral (R>L) upper extremity paresthesias is most likely residual from cervical myelopathy given the presence of cord myelomalacia, and most likely will have permanent deficits.  In this case, he has residual bilateral upper extremity paresthesias.  I offered to perform electrodiagnostic testing to be sure there was no overlapping entrapment neuropathy, but given that symptoms have not changed or progressed since his surgery, the likelihood of a secondary process is low.  I will request his most recent MRI cervical spine and EMG from Dr. Charmayne Sheer office.  If the patient changes his mind would like to pursue electrodiagnostic testing, he will contact my office.  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  I educated the patient on the importance of alcohol cessation as to minimize progression of neuropathy and optimize neural recovery. NCS/EMG of the legs was declined. Check vitamin B12, folate, vitamin B1, copper for nutritional deficiencies that can be seen with alcohol use can contribute to neuropathy Patient educated on daily foot inspection, fall prevention, and safety precautions around the home.  Further recommendations will be made after I have received his  records from [facility]  Thank you for allowing me to participate in patient's care.  If I can answer any additional questions, I would be pleased to do so.    Sincerely,    Kemara Quigley K. Posey Pronto, DO

## 2019-06-19 NOTE — Patient Instructions (Addendum)
Check labs  We will try to get your MRI cervical spine and EMG report  Once I get this information, my office will be back in touch with you

## 2019-06-25 LAB — B12 AND FOLATE PANEL
Folate: 9.2 ng/mL
Vitamin B-12: 753 pg/mL (ref 200–1100)

## 2019-06-25 LAB — COPPER, SERUM: Copper: 103 ug/dL (ref 70–175)

## 2019-06-25 LAB — VITAMIN B1: Vitamin B1 (Thiamine): 8 nmol/L (ref 8–30)

## 2019-06-26 ENCOUNTER — Telehealth: Payer: Self-pay

## 2019-06-26 NOTE — Telephone Encounter (Signed)
No answer at [phone]

## 2019-06-26 NOTE — Telephone Encounter (Signed)
Pt notified of labs

## 2019-06-26 NOTE — Telephone Encounter (Signed)
-----   Message from Alda Berthold, DO sent at 06/26/2019  7:49 AM EDT ----- Please inform patient that his vitamin B1 level is low-normal and to start vitamin B1 (thiamine) 16m daily. Thanks.

## 2019-07-04 ENCOUNTER — Ambulatory Visit: Payer: BC Managed Care – PPO | Admitting: Gastroenterology

## 2019-07-18 ENCOUNTER — Other Ambulatory Visit: Payer: Self-pay | Admitting: Internal Medicine

## 2019-07-20 ENCOUNTER — Other Ambulatory Visit: Payer: Self-pay | Admitting: Internal Medicine

## 2019-08-15 ENCOUNTER — Ambulatory Visit: Payer: BC Managed Care – PPO | Admitting: Internal Medicine

## 2019-09-18 ENCOUNTER — Ambulatory Visit: Payer: BC Managed Care – PPO | Admitting: Internal Medicine

## 2019-10-02 ENCOUNTER — Telehealth: Payer: Self-pay | Admitting: Neurology

## 2019-10-02 NOTE — Telephone Encounter (Signed)
Please offer a VV.

## 2019-10-02 NOTE — Telephone Encounter (Signed)
Patient's wife called regarding Todd Rodriguez having numbness, Tingling and swollen Feet. She also said that he is falling a lot. She would like to know if he needs to be seen or have a virtual visit? Please Advise. Thank you

## 2019-10-02 NOTE — Telephone Encounter (Signed)
Please advise on schedule

## 2019-10-11 ENCOUNTER — Encounter: Payer: Self-pay | Admitting: Neurology

## 2019-10-12 ENCOUNTER — Telehealth (INDEPENDENT_AMBULATORY_CARE_PROVIDER_SITE_OTHER): Payer: BC Managed Care – PPO | Admitting: Neurology

## 2019-10-12 ENCOUNTER — Other Ambulatory Visit: Payer: Self-pay

## 2019-10-12 VITALS — Ht 74.0 in | Wt 165.0 lb

## 2019-10-12 DIAGNOSIS — G621 Alcoholic polyneuropathy: Secondary | ICD-10-CM

## 2019-10-12 DIAGNOSIS — M79641 Pain in right hand: Secondary | ICD-10-CM | POA: Diagnosis not present

## 2019-10-12 DIAGNOSIS — M79642 Pain in left hand: Secondary | ICD-10-CM

## 2019-10-12 NOTE — Progress Notes (Signed)
   Virtual Visit via Video Note The purpose of this virtual visit is to provide medical care while limiting exposure to the novel coronavirus.    Consent was obtained for video visit:  Yes.   Answered questions that patient had about telehealth interaction:  Yes.   I discussed the limitations, risks, security and privacy concerns of performing an evaluation and management service by telemedicine. I also discussed with the patient that there may be a patient responsible charge related to this service. The patient expressed understanding and agreed to proceed.  Pt location: Home Physician Location: office Name of referring provider:  Burnis Medin, MD I connected with Todd Rodriguez at patients initiation/request on 10/12/2019 at 10:30 AM EST by video enabled telemedicine application and verified that I am speaking with the correct person using two identifiers. Pt MRN:  [id number] Pt DOB:  [birth year] Video Participants:  Todd Rodriguez   History of Present Illness: This is a 44 y.o. male returning with new complaints of bilateral feet numbness and tingling and bilateral hand pain.  Over the past several months, he has noticed tingling and burning sensation of the the toes and feet.  He feels that his balance is not as good as it used to be, but attributes this to being less active.  He has noted a stationary bike which he tries to write every day for activity.  He does have history of alcohol use and screening for thiamine deficiency showed borderline thiamine level.  He was advised to take thiamine 100 mg daily which he is compliant with.  He also complains of bilateral hand pain, described as achy, involving the joints.  He has not tried any NSAIDs or Tylenol.  He denies any numbness or tingling specifically involving the hands.  Observations/Objective:   Vitals:   10/11/19 1224  Weight: 165 lb (74.8 kg)  Height: 6' 2"  (1.88 m)   Patient is awake, alert, and appears comfortable.  Oriented  x 4.   Face is symmetric.  Speech is not dysarthric.  Antigravity in all extremities.  No pronator drift.  Finger tapping is intact. Gait appears mildly wide-based, stable, and unassisted.   Assessment and Plan:  1.  Alcohol induced neuropathy affecting the feet.  At his prior visit in September 2020, the symptoms are also present and exam showed findings consistent with neuropathy.  I have advised him to cut back, if not, stop alcohol consumption.  His labs from September indicated borderline normal vitamin B1. Continue thiamine 127m daily Continue gabapentin 300 mg 3 times daily Electrodiagnostic testing was declined by patient  2.  Bilateral hand pain is more consistent with musculoskeletal pain.  He can try Tylenol and if no improvement, recommend follow-up with PCP.  3.  History of cervical myelopathy s/p C4-5 and C5-6 ACDF with myelomalacia    Follow Up Instructions:   I discussed the assessment and treatment plan with the patient. The patient was provided an opportunity to ask questions and all were answered. The patient agreed with the plan and demonstrated an understanding of the instructions.   The patient was advised to call back or seek an in-person evaluation if the symptoms worsen or if the condition fails to improve as anticipated.   DAlda Berthold DO

## 2019-10-18 ENCOUNTER — Ambulatory Visit: Payer: BC Managed Care – PPO | Admitting: Internal Medicine

## 2019-10-18 ENCOUNTER — Encounter: Payer: Self-pay | Admitting: Internal Medicine

## 2019-10-18 ENCOUNTER — Other Ambulatory Visit: Payer: Self-pay | Admitting: Internal Medicine

## 2019-10-18 ENCOUNTER — Other Ambulatory Visit: Payer: Self-pay

## 2019-10-18 ENCOUNTER — Ambulatory Visit (INDEPENDENT_AMBULATORY_CARE_PROVIDER_SITE_OTHER): Payer: BC Managed Care – PPO

## 2019-10-18 VITALS — BP 120/62 | HR 96 | Temp 98.0°F | Wt 172.2 lb

## 2019-10-18 DIAGNOSIS — F17200 Nicotine dependence, unspecified, uncomplicated: Secondary | ICD-10-CM

## 2019-10-18 DIAGNOSIS — E118 Type 2 diabetes mellitus with unspecified complications: Secondary | ICD-10-CM

## 2019-10-18 DIAGNOSIS — E876 Hypokalemia: Secondary | ICD-10-CM

## 2019-10-18 DIAGNOSIS — R634 Abnormal weight loss: Secondary | ICD-10-CM

## 2019-10-18 DIAGNOSIS — I10 Essential (primary) hypertension: Secondary | ICD-10-CM

## 2019-10-18 DIAGNOSIS — R42 Dizziness and giddiness: Secondary | ICD-10-CM | POA: Diagnosis not present

## 2019-10-18 DIAGNOSIS — Z79899 Other long term (current) drug therapy: Secondary | ICD-10-CM

## 2019-10-18 LAB — BASIC METABOLIC PANEL
BUN: 6 mg/dL (ref 6–23)
CO2: 27 mEq/L (ref 19–32)
Calcium: 9.2 mg/dL (ref 8.4–10.5)
Chloride: 96 mEq/L (ref 96–112)
Creatinine, Ser: 0.87 mg/dL (ref 0.40–1.50)
GFR: 114.53 mL/min (ref >60.00)
Glucose, Bld: 302 mg/dL — ABNORMAL HIGH (ref 70–99)
Potassium: 3.3 mEq/L — ABNORMAL LOW (ref 3.5–5.1)
Sodium: 136 mEq/L (ref 135–145)

## 2019-10-18 LAB — HEMOGLOBIN A1C: Hgb A1c MFr Bld: 7.9 % — ABNORMAL HIGH (ref 4.6–6.5)

## 2019-10-18 LAB — CBC WITH DIFFERENTIAL/PLATELET
Basophils Absolute: 0 10*3/uL (ref 0.0–0.1)
Basophils Relative: 0.9 % (ref 0.0–3.0)
Eosinophils Absolute: 0.1 10*3/uL (ref 0.0–0.7)
Eosinophils Relative: 2.1 % (ref 0.0–5.0)
HCT: 35.1 % — ABNORMAL LOW (ref 39.0–52.0)
Hemoglobin: 11.7 g/dL — ABNORMAL LOW (ref 13.0–17.0)
Lymphocytes Relative: 29.4 % (ref 12.0–46.0)
Lymphs Abs: 1.2 10*3/uL (ref 0.7–4.0)
MCHC: 33.4 g/dL (ref 30.0–36.0)
MCV: 100.5 fl — ABNORMAL HIGH (ref 78.0–100.0)
Monocytes Absolute: 0.6 10*3/uL (ref 0.1–1.0)
Monocytes Relative: 16.5 % — ABNORMAL HIGH (ref 3.0–12.0)
Neutro Abs: 2 10*3/uL (ref 1.4–7.7)
Neutrophils Relative %: 51.1 % (ref 43.0–77.0)
Platelets: 272 10*3/uL (ref 150.0–400.0)
RBC: 3.49 Mil/uL — ABNORMAL LOW (ref 4.22–5.81)
RDW: 14.2 % (ref 11.5–15.5)
WBC: 3.9 10*3/uL — ABNORMAL LOW (ref 4.0–10.5)

## 2019-10-18 LAB — LIPID PANEL
Cholesterol: 111 mg/dL (ref 0–200)
HDL: 52.6 mg/dL (ref >39.00)
LDL Cholesterol: 44 mg/dL (ref 0–99)
NonHDL: 58.65
Total CHOL/HDL Ratio: 2
Triglycerides: 75 mg/dL (ref 0.0–149.0)
VLDL: 15 mg/dL (ref 0.0–40.0)

## 2019-10-18 LAB — HEPATIC FUNCTION PANEL
ALT: 32 U/L (ref 0–53)
AST: 23 U/L (ref 0–37)
Albumin: 4.1 g/dL (ref 3.5–5.2)
Alkaline Phosphatase: 87 U/L (ref 39–117)
Bilirubin, Direct: 0.2 mg/dL (ref 0.0–0.3)
Bilirubin, Total: 0.5
Total Bilirubin: 0.5 mg/dL (ref 0.2–1.2)
Total Protein: 6.3 g/dL (ref 6.0–8.3)

## 2019-10-18 LAB — TSH
TSH: 0.95 (ref <=5.90)
TSH: 0.95 u[IU]/mL (ref 0.35–4.50)

## 2019-10-18 LAB — MICROALBUMIN / CREATININE URINE RATIO
Creatinine,U: 41.1 mg/dL
Microalb Creat Ratio: 1.7 mg/g (ref 0.0–30.0)
Microalb, Ur: <0.7 mg/dL (ref 0.0–1.9)

## 2019-10-18 LAB — T4, FREE: Free T4: 1.02 ng/dL (ref 0.60–1.60)

## 2019-10-18 MED ORDER — AMLODIPINE BESY-BENAZEPRIL HCL 5-20 MG PO CAPS: 1.0000 | ORAL_CAPSULE | Freq: Every day | ORAL | 5 refills | Status: DC

## 2019-10-18 NOTE — Patient Instructions (Addendum)
I think that your blood pressure becomes too low and what we call orthostatic when you go from bending to sitting to standing. Stay hydrated with water( not sugar or alcohol)  We are going to decrease her blood pressure medication dosing Please check your blood pressure and pulse twice a day for the next  2 weeks Please send in readings on my chart or messaging.  We will do updated blood work to check for other causes. Continuing your counseling and trying to take care of yourself.  still want you to stop tobacco  We will check some lab test in regard to the mild swelling gabapentin can cause some mild swelling but some of these other problems can lead to swelling .

## 2019-10-18 NOTE — Progress Notes (Signed)
This visit occurred during the SARS-CoV-2 public health emergency.  Safety protocols were in place, including screening questions prior to the visit, additional usage of staff PPE, and extensive cleaning of exam room while observing appropriate contact time as indicated for disinfecting solutions.    Chief Complaint  Patient presents with  . balance issue     pt states he had swelling and is having diziness     HPI: Todd Rodriguez 44 y.o. come in for concers for him and family.   He has been dx with neuropathy per neuro  etoh and  Residual .  He sees endocrinology also  For dm    For About a month he has had  light headedness when bends over and upright   No syncope   Feels dizzy risk of falling also   No bleeding   sharp pains . Lateral legs otherwise no changes  Otherwise   Feet puffy  swelling    No cp sob  Otherwise  No cough  Continues to smole 1ppd.   Vision: has dry eyes.   Get watering eye doc .  No diabetic .  Changes in care with eye doc.   Sleep not good.  1130 - 4 am   Alcohol  Vodka .  "Only 2 . " when drinks and not every day or week says none this week   DM 210.  No lows known    Was down to 160.  Weight cause doesn't eat when stressed and sad   But taling with  Doristine Bosworth every week  Has los  5  relatives or close friends  In recent past  .   Started taking bioflex    Med list reviewed   Wife worried  He is losing weight and  Concern about alcohol risk  ROS: See pertinent positives and negatives per HPI. No cp sob    Past Medical History:  Diagnosis Date  . Acute pancreatitis 10/25/2013  . Arthritis   . Diabetes mellitus without complication ([place])   . GERD (gastroesophageal reflux disease)   . Hypertension   . Myalgia and myositis 04/21/2014   much better from illness presumed infectious    has disability form  will complete    . Pancreatitis     Family History  Problem Relation Age of Onset  . Diabetes Mother   . Hypertension Mother   . Diabetes  Father   . Hypertension Father   . Arthritis Maternal Grandmother   . Hypertension Maternal Grandfather   . Diabetes Maternal Grandfather   . Colon cancer Neg Hx   . Pancreatitis Neg Hx   . Pancreatic cancer Neg Hx     Social History   Socioeconomic History  . Marital status: Married    Spouse name: Not on file  . Number of children: 1  . Years of education: Not on file  . Highest education level: Not on file  Occupational History    Employer: [employer]  Tobacco Use  . Smoking status: Current Every Day Smoker    Packs/day: 1.00    Types: Cigarettes  . Smokeless tobacco: Never Used  Substance and Sexual Activity  . Alcohol use: Yes    Comment: every weekend  . Drug use: No  . Sexual activity: Yes    Partners: Female    Comment: Patient's Wife  Other Topics Concern  . 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 Difficulty of Paying Living Expenses: Not on file  Food Insecurity:   . Worried About Charity fundraiser in the Last Year: Not on file  . Ran Out of Food in the Last Year: Not on file  Transportation Needs:   . Lack of Transportation (Medical): Not on file  . Lack of Transportation (Non-Medical): Not on file  Physical Activity:   . Days of Exercise per Week: Not on file  . Minutes of Exercise per Session: Not on file  Stress:   . Feeling of Stress : Not on file  Social Connections:   . Frequency of Communication with Friends and Family: Not on file  . Frequency of Social Gatherings with Friends and Family: Not on file  . Attends Religious Services: Not on file  . Active Member of Clubs or Organizations: Not on file  .  Attends Archivist Meetings: Not on file  . Marital Status: Not on file    Outpatient Medications Prior to Visit  Medication Sig Dispense Refill  . albuterol (VENTOLIN HFA) 108 (90 Base) MCG/ACT inhaler TAKE 2 PUFFS BY MOUTH EVERY 6 HOURS AS NEEDED FOR WHEEZE OR SHORTNESS OF BREATH 1 g 1  . atorvastatin (LIPITOR) 40 MG tablet Take 1 tablet (40 mg total) by mouth daily. 90 tablet 1  . BYSTOLIC 5 MG tablet TAKE 1 TABLET (5 MG TOTAL) BY MOUTH DAILY. FOR HIGH BLOOD PRESSURE 30 tablet 1  . citalopram (CELEXA) 20 MG tablet TAKE 10 MG BY MOUTH PER DAY FOR 1 WEEK , THEN INCREASE TO 20 MG BY MOUTH PER DAY (Patient taking differently: Take 20 mg by mouth daily. ) 24 tablet 0  . empagliflozin (JARDIANCE) 25 MG TABS tablet Take 25 mg by mouth daily. 30 tablet 6  . gabapentin (NEURONTIN) 300 MG capsule Take 300 mg by mouth 3 (three) times daily.     Marland Kitchen glipiZIDE (GLUCOTROL) 5 MG tablet Take 1 tablet (5 mg total) by mouth 2 (two) times daily before a meal. 60 tablet 3  . glucose blood (ONE TOUCH TEST STRIPS) test strip Use as instructed 100 each 11  . LORazepam (ATIVAN) 1 MG tablet Take 1 tablet (1 mg total) by mouth every 8 (eight) hours as needed for anxiety. Panic attacks 20 tablet 0  . metFORMIN (GLUCOPHAGE) 1000 MG tablet Take 1 tablet (1,000 mg total) by mouth 2 (two) times daily with a meal. 180 tablet 3  . ondansetron (ZOFRAN ODT) 4 MG disintegrating tablet Take 1 tablet (4 mg total) by mouth every 8 (eight) hours as needed for nausea or vomiting. 20 tablet 0  . pantoprazole (PROTONIX) 40 MG tablet TAKE 1 TABLET BY MOUTH TWICE A DAY (INS WILL ONLY PAY ONCE A DAY) 30 tablet 5  . potassium phosphate, monobasic, (K-PHOS ORIGINAL) 500 MG tablet Take 500 mg by mouth 4 (four) times daily -  with meals and at bedtime.    . TURMERIC PO Take by mouth daily.    Marland Kitchen amLODipine-benazepril (LOTREL) 10-40 MG capsule TAKE 1 CAPSULE BY MOUTH EVERY DAY 30 capsule 2  No facility-administered medications prior  to visit.     EXAM:  BP 120/62 (BP Location: Right Arm, Patient Position: Sitting, Cuff Size: Normal)   Pulse 96   Temp 98 F (36.7 C) (Temporal)   Wt 172 lb 3.2 oz (78.1 kg)   SpO2 97%   BMI 22.11 kg/m   Body mass index is 22.11 kg/m. Wt Readings from Last 3 Encounters:  10/18/19 172 lb 3.2 oz (78.1 kg)  10/11/19 165 lb (74.8 kg)  06/19/19 180 lb (81.6 kg)    GENERAL: vitals reviewed and listed above, alert, oriented, appears well hydrated and in no acute distress  bp sitting   120/60 standing  100/50?  Pulse 86  HEENT: atraumatic, conjunctiva  clear, no obvious abnormalities on inspection of external nose and ears OP :cloth mask  Non icteric muddy  Sclera  NECK: no obvious masses on inspection palpation  LUNGS: clear to auscultation bilaterally, no wheezes, rales or rhonchi, good air movement CV: HRRR, no clubbing cyanosis or    Feet slight edema puffy nl cap refill  Abdomen:  Sof,t normal bowel sounds without hepatosplenomegaly, no guarding rebound or masses no CVA tenderness no ascites MS: moves all extremities major loss of muscle mass  Symmetrically upper more than lower  Nl tone? Vibration testing not done  Gait appears nl  Grossly  Not tested  PSYCH: pleasant and cooperative, nl though speech   oriented  Diabetic Foot Exam - Simple   Simple Foot Form Diabetic Foot exam was performed with the following findings: Yes 10/18/2019 10:55 AM  Visual Inspection No deformities, no ulcerations, no other skin breakdown bilaterally: Yes Sensation Testing Intact to touch and monofilament testing bilaterally: Yes Pulse Check Posterior Tibialis and Dorsalis pulse intact bilaterally: Yes Comments Puffy foot  Long nails but no ulcers sig callous ulcers  Skin changes      Lab Results  Component Value Date   WBC 4.6 06/19/2019   HGB 14.4 06/19/2019   HCT 42.6 06/19/2019   PLT 368.0 06/19/2019   GLUCOSE 78 06/19/2019   CHOL 176 06/19/2019   TRIG 85.0 06/19/2019   HDL 80.00  06/19/2019   LDLDIRECT 193.0 03/21/2019   LDLCALC 79 06/19/2019   ALT 20 06/19/2019   AST 31 06/19/2019   NA 140 06/19/2019   K 4.6 06/19/2019   CL 97 06/19/2019   CREATININE 0.70 06/19/2019   BUN 7 06/19/2019   CO2 25 06/19/2019   TSH 1.03 03/21/2019   PSA 0.76 01/07/2016   HGBA1C 8.5 (H) 06/19/2019   MICROALBUR 2.4 (H) 03/21/2019   BP Readings from Last 3 Encounters:  10/18/19 120/62  06/19/19 120/82  06/05/19 119/77  had eggs sausage this am and lemonade   ASSESSMENT AND PLAN:  Discussed the following assessment and plan:  Orthostatic dizziness - Plan: Basic metabolic panel, CBC with Differential, Hemoglobin A1c, Hepatic function panel, Lipid panel, TSH, T4, Free (Thyrox), Microalbumin / creatinine urine ratio  Weight loss - Plan: Basic metabolic panel, CBC with Differential, Hemoglobin A1c, Hepatic function panel, Lipid panel, TSH, T4, Free (Thyrox), Microalbumin / creatinine urine ratio, DG Chest 2 View  Tobacco dependence - Plan: Basic metabolic panel, CBC with Differential, Hemoglobin A1c, Hepatic function panel, Lipid panel, TSH, T4, Free (Thyrox), Microalbumin / creatinine urine ratio, DG Chest 2 View  Controlled diabetes mellitus type 2 with complications, unspecified whether long term insulin use ([place]) - Plan: Basic metabolic panel, CBC with Differential, Hemoglobin A1c, Hepatic function panel, Lipid panel, TSH, T4, Free (Thyrox),  Microalbumin / creatinine urine ratio  Medication management - Plan: Basic metabolic panel, CBC with Differential, Hemoglobin A1c, Hepatic function panel, Lipid panel, TSH, T4, Free (Thyrox), Microalbumin / creatinine urine ratio  Essential hypertension, benign - Plan: Basic metabolic panel, CBC with Differential, Hemoglobin A1c, Hepatic function panel, Lipid panel, TSH, T4, Free (Thyrox), Microalbumin / creatinine urine ratio Update labs and urine  For protein  Suspect orthostatic hypotension is because of the many of his symptoms with  associated weight loss medications uncertain hydration status. Discussed avoidance of sugar drinks and alcohol challenge him to go 2 weeks with just water.  We will do follow readings for the next 2 weeks if readings are low i.e. 100 110 we may hold the medicine temporarily he is currently on 3 antihypertensive medicines and has lost significant amount of weight over the last year. Could hold  A  Med if low bp that day  And then adjust med as indicated  We will get chest x-ray today because of his history of weight loss and tobacco use although he attributes his weight loss to" not eating because of stress "" Encouraged to continue counseling .  Support  -Patient advised to return or notify health care team  if  new concerns arise.  Patient Instructions  I think that your blood pressure becomes too low and what we call orthostatic when you go from bending to sitting to standing. Stay hydrated with water( not sugar or alcohol)  We are going to decrease her blood pressure medication dosing Please check your blood pressure and pulse twice a day for the next  2 weeks Please send in readings on my chart or messaging.  We will do updated blood work to check for other causes. Continuing your counseling and trying to take care of yourself.  still want you to stop tobacco  We will check some lab test in regard to the mild swelling gabapentin can cause some mild swelling but some of these other problems can lead to swelling .    Outside external source  DATA REVIEWED:  neuro and endo  Reviews   Independent historian: wife   Total time on date  of service including record review ordering and plan of care:   50 minutes    Greenley Martone K. Kris No M.D. Last  Neuro check  Assessment and Plan:  1.  Alcohol induced neuropathy affecting the feet.  At his prior visit in September 2020, the symptoms are also present and exam showed findings consistent with neuropathy.  I have advised him to cut back, if not,  stop alcohol consumption.  His labs from September indicated borderline normal vitamin B1. Continue thiamine 110m daily Continue gabapentin 300 mg 3 times daily Electrodiagnostic testing was declined by patient  2.  Bilateral hand pain is more consistent with musculoskeletal pain.  He can try Tylenol and if no improvement, recommend follow-up with PCP.  3.  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

## 2019-10-20 NOTE — Progress Notes (Signed)
Potassium slightly. low , liver tests better, blood sugar up( after eating)  but a1c a bit better ( still needs to come don)  you are mildly anemic  thyroid is normal   are you taking  prescription potassium?  If not then  rx potassium 20 meq take  one qd disp 30 refill x 2  and plan  recheck potassium   ( BMP) and magnesium in 1 month   Plan keeping virtual visit in a month

## 2019-10-20 NOTE — Addendum Note (Signed)
Addended by: Modena Morrow R on: 10/20/2019 01:23 PM   Modules accepted: Orders

## 2019-11-05 ENCOUNTER — Other Ambulatory Visit: Payer: Self-pay | Admitting: Internal Medicine

## 2019-11-14 ENCOUNTER — Encounter: Payer: Self-pay | Admitting: Internal Medicine

## 2019-11-14 ENCOUNTER — Encounter: Payer: Self-pay | Admitting: Gastroenterology

## 2019-11-14 ENCOUNTER — Telehealth (INDEPENDENT_AMBULATORY_CARE_PROVIDER_SITE_OTHER): Payer: BC Managed Care – PPO | Admitting: Internal Medicine

## 2019-11-14 ENCOUNTER — Other Ambulatory Visit: Payer: Self-pay

## 2019-11-14 DIAGNOSIS — K625 Hemorrhage of anus and rectum: Secondary | ICD-10-CM

## 2019-11-14 DIAGNOSIS — I10 Essential (primary) hypertension: Secondary | ICD-10-CM | POA: Diagnosis not present

## 2019-11-14 DIAGNOSIS — E876 Hypokalemia: Secondary | ICD-10-CM | POA: Diagnosis not present

## 2019-11-14 NOTE — Progress Notes (Signed)
Virtual Visit via Video Note  I connected with@ on 11/14/19 at  3:30 PM EST by a video enabled telemedicine application and verified that I am speaking with the correct person using two identifiers. Location patient: home Location provider:work  office Persons participating in the virtual visit: patient, provider  [place] national recommendations  regarding COVID 19 pandemic   video visit is advised over in office visit for this patient.  Patient aware  of the limitations of evaluation and management by telemedicine and  availability of in person appointments. and agreed to proceed.   HPI: Todd Rodriguez presents for video visit  For new problem   No change in bowel habits but had   Stool and had BRB when wiped  Only one time yesterday/  Not since then  No hx of same and no diarrhea abc pain  Fever.   bg doing better?   Due for lab next week  But this cam up.   No bruising or bleeding otherwise  ROS: See pertinent positives and negatives per HPI.  Past Medical History:  Diagnosis Date  . Acute pancreatitis 10/25/2013  . Arthritis   . Diabetes mellitus without complication ([place])   . GERD (gastroesophageal reflux disease)   . Hypertension   . Myalgia and myositis 04/21/2014   much better from illness presumed infectious    has disability form  will complete    . Pancreatitis     Past Surgical History:  Procedure Laterality Date  . ANTERIOR CERVICAL DECOMP/DISCECTOMY FUSION N/A 01/12/2018   Procedure: Revision ACDF C4-5, removal of hardware C5-6, exploration of fusion C5-6;  Surgeon: [name], MD;  Location: [facility];  Service: Orthopedics;  Laterality: N/A;  3.5 hrs  . NECK SURGERY     2 disc  . SPINAL FUSION      Family History  Problem Relation Age of Onset  . Diabetes Mother   . Hypertension Mother   . Diabetes Father   . Hypertension Father   . Arthritis Maternal Grandmother   . Hypertension Maternal Grandfather   . Diabetes Maternal Grandfather   . Colon cancer Neg  Hx   . Pancreatitis Neg Hx   . Pancreatic cancer Neg Hx     Social History   Tobacco Use  . Smoking status: Current Every Day Smoker    Packs/day: 1.00    Types: Cigarettes  . Smokeless tobacco: Never Used  Substance Use Topics  . Alcohol use: Yes    Comment: every weekend  . Drug use: No      Current Outpatient Medications:  .  albuterol (VENTOLIN HFA) 108 (90 Base) MCG/ACT inhaler, TAKE 2 PUFFS BY MOUTH EVERY 6 HOURS AS NEEDED FOR WHEEZE OR SHORTNESS OF BREATH, Disp: 1 g, Rfl: 1 .  amLODipine-benazepril (LOTREL) 5-20 MG capsule, Take 1 capsule by mouth daily., Disp: 30 capsule, Rfl: 5 .  atorvastatin (LIPITOR) 40 MG tablet, Take 1 tablet (40 mg total) by mouth daily., Disp: 90 tablet, Rfl: 1 .  BYSTOLIC 5 MG tablet, TAKE 1 TABLET (5 MG TOTAL) BY MOUTH DAILY. FOR HIGH BLOOD PRESSURE, Disp: 90 tablet, Rfl: 1 .  citalopram (CELEXA) 20 MG tablet, TAKE 10 MG BY MOUTH PER DAY FOR 1 WEEK , THEN INCREASE TO 20 MG BY MOUTH PER DAY (Patient taking differently: Take 20 mg by mouth daily. ), Disp: 24 tablet, Rfl: 0 .  empagliflozin (JARDIANCE) 25 MG TABS tablet, Take 25 mg by mouth daily., Disp: 30 tablet, Rfl: 6 .  gabapentin (  NEURONTIN) 300 MG capsule, Take 300 mg by mouth 3 (three) times daily. , Disp: , Rfl:  .  glipiZIDE (GLUCOTROL) 5 MG tablet, Take 1 tablet (5 mg total) by mouth 2 (two) times daily before a meal., Disp: 60 tablet, Rfl: 3 .  glucose blood (ONE TOUCH TEST STRIPS) test strip, Use as instructed, Disp: 100 each, Rfl: 11 .  LORazepam (ATIVAN) 1 MG tablet, Take 1 tablet (1 mg total) by mouth every 8 (eight) hours as needed for anxiety. Panic attacks, Disp: 20 tablet, Rfl: 0 .  metFORMIN (GLUCOPHAGE) 1000 MG tablet, Take 1 tablet (1,000 mg total) by mouth 2 (two) times daily with a meal., Disp: 180 tablet, Rfl: 3 .  ondansetron (ZOFRAN ODT) 4 MG disintegrating tablet, Take 1 tablet (4 mg total) by mouth every 8 (eight) hours as needed for nausea or vomiting., Disp: 20 tablet,  Rfl: 0 .  pantoprazole (PROTONIX) 40 MG tablet, TAKE 1 TABLET BY MOUTH TWICE A DAY (INS WILL ONLY PAY ONCE A DAY), Disp: 30 tablet, Rfl: 5 .  potassium phosphate, monobasic, (K-PHOS ORIGINAL) 500 MG tablet, Take 500 mg by mouth 4 (four) times daily -  with meals and at bedtime., Disp: , Rfl:  .  TURMERIC PO, Take by mouth daily., Disp: , Rfl:   EXAM: BP Readings from Last 3 Encounters:  10/18/19 120/62  06/19/19 120/82  06/05/19 119/77    VITALS per patient if applicable:  GENERAL: alert, oriented, appears well and in no acute distress  HEENT: atraumatic, conjunttiva clear, no obvious abnormalities on inspection of external nose and ears  NECK: normal movements of the head and neck  LUNGS: on inspection no signs of respiratory distress, breathing rate appears normal, no obvious gross SOB, gasping or wheezing  CV: no obvious cyanosis  PSYCH/NEURO: pleasant and cooperative, , speech and thought processing grossly intact Lab Results  Component Value Date   WBC 3.9 (L) 10/18/2019   HGB 11.7 (L) 10/18/2019   HCT 35.1 (L) 10/18/2019   PLT 272.0 10/18/2019   GLUCOSE 302 (H) 10/18/2019   CHOL 111 10/18/2019   TRIG 75.0 10/18/2019   HDL 52.60 10/18/2019   LDLDIRECT 193.0 03/21/2019   LDLCALC 44 10/18/2019   ALT 32 10/18/2019   AST 23 10/18/2019   NA 136 10/18/2019   K 3.3 (L) 10/18/2019   CL 96 10/18/2019   CREATININE 0.87 10/18/2019   BUN 6 10/18/2019   CO2 27 10/18/2019   TSH 0.95 10/18/2019   PSA 0.76 01/07/2016   HGBA1C 7.9 (H) 10/18/2019   MICROALBUR <0.7 10/18/2019    ASSESSMENT AND PLAN:  Discussed the following assessment and plan:    ICD-10-CM   1. RB (rectal bleeding)  K81.2 Basic metabolic panel    CBC with Differential/Platelet    Magnesium    IBC + Ferritin    Ambulatory referral to Gastroenterology   sounds minor but needs eval and colon screen   2. Essential hypertension, benign  X51 Basic metabolic panel    CBC with Differential/Platelet     Magnesium    IBC + Ferritin  3. Low serum potassium  Z00.1 Basic metabolic panel    CBC with Differential/Platelet    Magnesium    IBC + Ferritin    Counseled.  Alarm sx and to proceed  Plan  Referral to GI ( prev referral didn't happen  about liver abn)  But also  Colon screening needed anyway .  Pat to expect a call for appt  Due  for  f/u lab and will repeat cbc and iron studies  To get at [facility] in next week or so .  Pt reports BG is getting better   Expectant management and discussion of plan and treatment with opportunity to ask questions and all were answered. The patient agreed with the plan and demonstrated an understanding of the instructions.  fu labs and referral  Again  transaminitiss seems better  On last check  Advised to call back or seek an in-person evaluation if worsening  or having  further concerns . In interim  Return for when planned or if worse in interim.  Shanon Ace, MD

## 2019-11-16 ENCOUNTER — Other Ambulatory Visit: Payer: Self-pay | Admitting: Internal Medicine

## 2019-11-25 ENCOUNTER — Encounter (HOSPITAL_COMMUNITY): Payer: Self-pay | Admitting: Emergency Medicine

## 2019-11-25 ENCOUNTER — Emergency Department (HOSPITAL_COMMUNITY)
Admission: EM | Admit: 2019-11-25 | Discharge: 2019-11-25 | Disposition: A | Discharge status: Home / Self Care | Payer: BC Managed Care – PPO | Attending: Emergency Medicine | Admitting: Emergency Medicine

## 2019-11-25 ENCOUNTER — Other Ambulatory Visit: Payer: Self-pay

## 2019-11-25 DIAGNOSIS — Z20822 Contact with and (suspected) exposure to covid-19: Secondary | ICD-10-CM | POA: Insufficient documentation

## 2019-11-25 DIAGNOSIS — R509 Fever, unspecified: Secondary | ICD-10-CM | POA: Diagnosis present

## 2019-11-25 DIAGNOSIS — F17210 Nicotine dependence, cigarettes, uncomplicated: Secondary | ICD-10-CM | POA: Insufficient documentation

## 2019-11-25 DIAGNOSIS — I10 Essential (primary) hypertension: Secondary | ICD-10-CM | POA: Insufficient documentation

## 2019-11-25 DIAGNOSIS — Z7984 Long term (current) use of oral hypoglycemic drugs: Secondary | ICD-10-CM | POA: Diagnosis not present

## 2019-11-25 DIAGNOSIS — E11649 Type 2 diabetes mellitus with hypoglycemia without coma: Secondary | ICD-10-CM | POA: Diagnosis not present

## 2019-11-25 DIAGNOSIS — Z79899 Other long term (current) drug therapy: Secondary | ICD-10-CM | POA: Diagnosis not present

## 2019-11-25 DIAGNOSIS — E162 Hypoglycemia, unspecified: Secondary | ICD-10-CM

## 2019-11-25 LAB — CBC AND DIFFERENTIAL
HCT: 43 (ref 41–53)
Hemoglobin: 14.1 (ref 13.5–17.5)
Platelets: 205 (ref 150–399)
WBC: 3.1
WBC: 3.1

## 2019-11-25 LAB — BASIC METABOLIC PANEL
Anion gap: 15 (ref 5–15)
BUN: 12 (ref 4–21)
BUN: 12 mg/dL (ref 6–20)
CO2: 22 mmol/L (ref 22–32)
Calcium: 8.5 mg/dL — ABNORMAL LOW (ref 8.9–10.3)
Chloride: 103 mmol/L (ref 98–111)
Creatinine, Ser: 0.78 mg/dL (ref 0.61–1.24)
Creatinine: 0.8 (ref <=1.3)
GFR calc Af Amer: >60 mL/min (ref >60)
GFR calc non Af Amer: >60 mL/min (ref >60)
Glucose, Bld: 38 mg/dL — CL (ref 70–99)
Potassium: 3.6 mmol/L (ref 3.5–5.1)
Sodium: 140 (ref 137–147)
Sodium: 140 mmol/L (ref 135–145)

## 2019-11-25 LAB — CBC WITH DIFFERENTIAL/PLATELET
Abs Immature Granulocytes: 0.02 10*3/uL (ref 0.00–0.07)
Basophils Absolute: 0 10*3/uL (ref 0.0–0.1)
Basophils Relative: 1 %
Eosinophils Absolute: 0 10*3/uL (ref 0.0–0.5)
Eosinophils Relative: 0 %
HCT: 42.8 % (ref 39.0–52.0)
Hemoglobin: 14.1 g/dL (ref 13.0–17.0)
Immature Granulocytes: 1 %
Lymphocytes Relative: 30 %
Lymphs Abs: 0.9 10*3/uL (ref 0.7–4.0)
MCH: 33.2 pg (ref 26.0–34.0)
MCHC: 32.9 g/dL (ref 30.0–36.0)
MCV: 100.7 fL — ABNORMAL HIGH (ref 80.0–100.0)
Monocytes Absolute: 0.3 10*3/uL (ref 0.1–1.0)
Monocytes Relative: 11 %
Neutro Abs: 1.8 10*3/uL (ref 1.7–7.7)
Neutrophils Relative %: 57 %
Platelets: 205 10*3/uL (ref 150–400)
RBC: 4.25 MIL/uL (ref 4.22–5.81)
RDW: 12.4 % (ref 11.5–15.5)
WBC: 3.1 10*3/uL — ABNORMAL LOW (ref 4.0–10.5)
nRBC: 0 % (ref 0.0–0.2)

## 2019-11-25 LAB — CBG MONITORING, ED
Glucose-Capillary: 57 mg/dL — ABNORMAL LOW (ref 70–99)
Glucose-Capillary: 132 mg/dL — ABNORMAL HIGH (ref 70–99)

## 2019-11-25 NOTE — ED Notes (Signed)
Provided pt with some apple juice after CBG checked and was 57.

## 2019-11-25 NOTE — ED Triage Notes (Signed)
Patient c/o fever, body aches, loose BMs, and chills x1 week. Denies taking any medications for symptoms and is unsure what his highest fever has been.

## 2019-11-25 NOTE — ED Notes (Signed)
Lab called concerning pt's glucose is 38, EDP made aware of glucose.  Ginger ale and saltine crackers provided for pt.

## 2019-11-25 NOTE — Discharge Instructions (Addendum)
See your Physician for recheck. Return if any problems. Stop taking glucotrol until evaluated by your MD

## 2019-11-25 NOTE — ED Provider Notes (Signed)
[facility] EMERGENCY DEPARTMENT Provider Note   CSN: [id number] Arrival date & time: 11/25/19  1017     History Chief Complaint  Patient presents with  . Fever    Todd Rodriguez is a 44 y.o. male.  The history is provided by the patient. No language interpreter was used.  Fever Temp source:  Subjective Severity:  Mild Onset quality:  Gradual Timing:  Constant Chronicity:  New Relieved by:  Nothing Worsened by:  Nothing Associated symptoms: no chills   Risk factors: no sick contacts    Pt complains of feeling bad.  Pt reports he felt like he had a fever earlier     Past Medical History:  Diagnosis Date  . Acute pancreatitis 10/25/2013  . Arthritis   . Diabetes mellitus without complication ([place])   . GERD (gastroesophageal reflux disease)   . Hypertension   . Myalgia and myositis 04/21/2014   much better from illness presumed infectious    has disability form  will complete    . Pancreatitis     Patient Active Problem List   Diagnosis Date Noted  . Protein-calorie malnutrition, severe 05/29/2019  . Starvation ketoacidosis 05/28/2019  . Alcohol abuse 05/28/2019  . Thrombocytopenia ([place]) 05/28/2019  . Pancreatic cyst 05/28/2019  . Hepatic lesion 05/28/2019  . Dyslipidemia 05/11/2019  . Abnormal LFTs 09/12/2018  . Medication management 09/12/2018  . 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 Stenosis of cervical spine with myelopathy ([place]) 01/03/2018  . Carpal tunnel syndrome of left wrist 12/10/2017  . Cervical radiculopathy 11/11/2017  . History of fusion of cervical spine 10/22/2017  . Cramps, extremity 06/25/2015  . Diabetes mellitus type 2, uncontrolled, without complications 30/16/0109  . Hand cramps 01/25/2015  . Fever, unspecified 03/26/2014  . Hx of acute pancreatitis 01/03/2014  . Tobacco dependence 01/03/2014  . Other and unspecified hyperlipidemia 01/03/2014  . GERD (gastroesophageal reflux disease) 11/22/2013  . Diabetes mellitus type 2,  controlled, without complications ([place]) 32/35/5732  . Essential hypertension, benign 10/25/2013  . Leukocytosis, unspecified 10/25/2013    Past Surgical History:  Procedure Laterality Date  . ANTERIOR CERVICAL DECOMP/DISCECTOMY FUSION N/A 01/12/2018   Procedure: Revision ACDF C4-5, removal of hardware C5-6, exploration of fusion C5-6;  Surgeon: [name], MD;  Location: [facility];  Service: Orthopedics;  Laterality: N/A;  3.5 hrs  . NECK SURGERY     2 disc  . SPINAL FUSION         Family History  Problem Relation Age of Onset  . Diabetes Mother   . Hypertension Mother   . Diabetes Father   . Hypertension Father   . Arthritis Maternal Grandmother   . Hypertension Maternal Grandfather   . Diabetes Maternal Grandfather   . Colon cancer Neg Hx   . Pancreatitis Neg Hx   . Pancreatic cancer Neg Hx     Social History   Tobacco Use  . Smoking status: Current Every Day Smoker    Packs/day: 1.00    Types: Cigarettes  . Smokeless tobacco: Never Used  Substance Use Topics  . Alcohol use: Yes    Comment: every weekend  . Drug use: No    Home Medications Prior to Admission medications   Medication Sig Start Date End Date Taking? Authorizing Provider  albuterol (VENTOLIN HFA) 108 (90 Base) MCG/ACT inhaler TAKE 2 PUFFS BY MOUTH EVERY 6 HOURS AS NEEDED FOR WHEEZE OR SHORTNESS OF BREATH 05/04/19   Panosh, Standley Brooking, MD  amLODipine-benazepril (LOTREL) 5-20 MG capsule Take 1  capsule by mouth daily. 10/18/19   Panosh, Standley Brooking, MD  atorvastatin (LIPITOR) 40 MG tablet TAKE 1 TABLET BY MOUTH EVERY DAY 11/17/19   Panosh, Standley Brooking, MD  BYSTOLIC 5 MG tablet TAKE 1 TABLET (5 MG TOTAL) BY MOUTH DAILY. FOR HIGH BLOOD PRESSURE 11/06/19   Panosh, Standley Brooking, MD  citalopram (CELEXA) 20 MG tablet TAKE 10 MG BY MOUTH PER DAY FOR 1 WEEK , THEN INCREASE TO 20 MG BY MOUTH PER DAY Patient taking differently: Take 20 mg by mouth daily.  10/20/18   Panosh, Standley Brooking, MD  empagliflozin (JARDIANCE) 25 MG TABS tablet Take  25 mg by mouth daily. 05/10/19   Shamleffer, Melanie Crazier, MD  gabapentin (NEURONTIN) 300 MG capsule Take 300 mg by mouth 3 (three) times daily.  04/12/19   [provider]  glipiZIDE (GLUCOTROL) 5 MG tablet Take 1 tablet (5 mg total) by mouth 2 (two) times daily before a meal. 05/10/19   Shamleffer, Melanie Crazier, MD  glucose blood (ONE TOUCH TEST STRIPS) test strip Use as instructed 06/25/15   Panosh, Standley Brooking, MD  LORazepam (ATIVAN) 1 MG tablet Take 1 tablet (1 mg total) by mouth every 8 (eight) hours as needed for anxiety. Panic attacks 05/24/18   Panosh, Standley Brooking, MD  metFORMIN (GLUCOPHAGE) 1000 MG tablet Take 1 tablet (1,000 mg total) by mouth 2 (two) times daily with a meal. 05/10/19   Shamleffer, Melanie Crazier, MD  ondansetron (ZOFRAN ODT) 4 MG disintegrating tablet Take 1 tablet (4 mg total) by mouth every 8 (eight) hours as needed for nausea or vomiting. 01/12/18   Mayo, Darla Lesches, PA-C  pantoprazole (PROTONIX) 40 MG tablet TAKE 1 TABLET BY MOUTH TWICE A DAY (INS WILL ONLY PAY ONCE A DAY) 03/14/19   Panosh, Standley Brooking, MD  potassium phosphate, monobasic, (K-PHOS ORIGINAL) 500 MG tablet Take 500 mg by mouth 4 (four) times daily -  with meals and at bedtime.    [provider]  TURMERIC PO Take by mouth daily.    [provider]    Allergies    Patient has no known allergies.  Review of Systems   Review of Systems  Constitutional: Positive for fever. Negative for chills.  All other systems reviewed and are negative.   Physical Exam Updated Vital Signs BP (!) 144/84 (BP Location: Right Arm)   Pulse 80   Temp 97.7 F (36.5 C) (Oral)   Resp 14   Ht 6' 2"  (1.88 m)   Wt 72.6 kg   SpO2 100%   BMI 20.54 kg/m   Physical Exam Vitals and nursing note reviewed.  Constitutional:      Appearance: He is well-developed.  HENT:     Head: Normocephalic and atraumatic.     Right Ear: Tympanic membrane normal.     Left Ear: Tympanic membrane normal.     Nose:  Nose normal.     Mouth/Throat:     Mouth: Mucous membranes are moist.  Eyes:     Conjunctiva/sclera: Conjunctivae normal.  Cardiovascular:     Rate and Rhythm: Normal rate and regular rhythm.     Heart sounds: No murmur.  Pulmonary:     Effort: Pulmonary effort is normal. No respiratory distress.     Breath sounds: Normal breath sounds.  Abdominal:     Palpations: Abdomen is soft.     Tenderness: There is no abdominal tenderness.  Musculoskeletal:     Cervical back: Neck supple.  Skin:  General: Skin is warm and dry.  Neurological:     General: No focal deficit present.     Mental Status: He is alert.  Psychiatric:        Mood and Affect: Mood normal.     ED Results / Procedures / Treatments   Labs (all labs ordered are listed, but only abnormal results are displayed) Labs Reviewed  CBC WITH DIFFERENTIAL/PLATELET - Abnormal; Notable for the following components:      Result Value   WBC 3.1 (*)    MCV 100.7 (*)    All other components within normal limits  BASIC METABOLIC PANEL - Abnormal; Notable for the following components:   Glucose, Bld 38 (*)    Calcium 8.5 (*)    All other components within normal limits  CBG MONITORING, ED - Abnormal; Notable for the following components:   Glucose-Capillary 57 (*)    All other components within normal limits  SARS CORONAVIRUS 2 (TAT 6-24 HRS)    EKG None  Radiology No results found.  Procedures Procedures (including critical care time)  Medications Ordered in ED Medications - No data to display  ED Course  I have reviewed the triage vital signs and the nursing notes.  Pertinent labs & imaging results that were available during my care of the patient were reviewed by me and considered in my medical decision making (see chart for details).    MDM Rules/Calculators/A&P                      MDM:  Pt glucose 35.  Pt given food and fluids.  Pt advised to hold his glucotrol.  Pt advised to see his MD for recheck.   Covid ordered and pending Final Clinical Impression(s) / ED Diagnoses Final diagnoses:  Hypoglycemia    Rx / DC Orders ED Discharge Orders    None    An After Visit Summary was printed and given to the patient.    Fransico Meadow, [place] 11/25/19 [place], MD 11/26/19 [phone]

## 2019-11-25 NOTE — ED Notes (Signed)
Pt with c/o chills, loose stools, fatigue, unable to sleep at night.  Also c/o bilateral hands and feet swelling. Denies any pain at present.

## 2019-11-26 LAB — SARS CORONAVIRUS 2 (TAT 6-24 HRS): SARS Coronavirus 2: NEGATIVE

## 2019-11-28 NOTE — Telephone Encounter (Signed)
So I don't have enough information to guide  This plan   Not sure why your  Sugar was down and  that needs to be addressed and  I dont see any recent fluid pill on you list . Need a visit  Of virtual to decide    Gabapentin can also  Cause swelling  Wt Readings from Last 3 Encounters:  11/25/19 160 lb (72.6 kg)  10/18/19 172 lb 3.2 oz (78.1 kg)  10/11/19 165 lb (74.8 kg)

## 2019-11-29 NOTE — Progress Notes (Signed)
Virtual Visit via Video Note  I connected with@ on 2 25 21at  9:00 AM EST by a video enabled telemedicine application and verified that I am speaking with the correct person using two identifiers. Location patient: home Location provider:work office Persons participating in the virtual visit: patient, provider  WIth national recommendations  regarding COVID 19 pandemic   video visit is advised over in office visit for this patient.  Patient aware  of the limitations of evaluation and management by telemedicine and  availability of in person appointments. and agreed to proceed.   HPI: Todd Rodriguez presents for video visit  Seen in ed for feeling bad  Weak feeling and felt vision was blurry  and noted to have low BG  37 range   Given oral replenishment and  Better    Had not taken med that day yet   hwas aobut 11 am says only taking glipizide once a day   Has pill box   bp "has been good"  Asking for  Fluid pill for swollen feet ( not mentioned in ed visit) happended over last week  Some better to day no hx of same and no new resp sx sodium intake?   Says no recent etoh ROS: See pertinent positives and negatives per HPI. No cp sob  Not a lot of exercis but has exercise bike   Past Medical History:  Diagnosis Date  . Acute pancreatitis 10/25/2013  . Arthritis   . Diabetes mellitus without complication ([place])   . GERD (gastroesophageal reflux disease)   . Hypertension   . Myalgia and myositis 04/21/2014   much better from illness presumed infectious    has disability form  will complete    . Pancreatitis     Past Surgical History:  Procedure Laterality Date  . ANTERIOR CERVICAL DECOMP/DISCECTOMY FUSION N/A 01/12/2018   Procedure: Revision ACDF C4-5, removal of hardware C5-6, exploration of fusion C5-6;  Surgeon: [name], MD;  Location: [facility];  Service: Orthopedics;  Laterality: N/A;  3.5 hrs  . NECK SURGERY     2 disc  . SPINAL FUSION      Family History  Problem Relation  Age of Onset  . Diabetes Mother   . Hypertension Mother   . Diabetes Father   . Hypertension Father   . Arthritis Maternal Grandmother   . Hypertension Maternal Grandfather   . Diabetes Maternal Grandfather   . Colon cancer Neg Hx   . Pancreatitis Neg Hx   . Pancreatic cancer Neg Hx     Social History   Tobacco Use  . Smoking status: Current Every Day Smoker    Packs/day: 1.00    Types: Cigarettes  . Smokeless tobacco: Never Used  Substance Use Topics  . Alcohol use: Yes    Comment: every weekend  . Drug use: No      Current Outpatient Medications:  .  albuterol (VENTOLIN HFA) 108 (90 Base) MCG/ACT inhaler, TAKE 2 PUFFS BY MOUTH EVERY 6 HOURS AS NEEDED FOR WHEEZE OR SHORTNESS OF BREATH, Disp: 1 g, Rfl: 1 .  amLODipine-benazepril (LOTREL) 5-20 MG capsule, Take 1 capsule by mouth daily., Disp: 30 capsule, Rfl: 5 .  atorvastatin (LIPITOR) 40 MG tablet, TAKE 1 TABLET BY MOUTH EVERY DAY, Disp: 30 tablet, Rfl: 5 .  BYSTOLIC 5 MG tablet, TAKE 1 TABLET (5 MG TOTAL) BY MOUTH DAILY. FOR HIGH BLOOD PRESSURE, Disp: 90 tablet, Rfl: 1 .  citalopram (CELEXA) 20 MG tablet, TAKE 10 MG BY  MOUTH PER DAY FOR 1 WEEK , THEN INCREASE TO 20 MG BY MOUTH PER DAY (Patient taking differently: Take 20 mg by mouth daily. ), Disp: 24 tablet, Rfl: 0 .  empagliflozin (JARDIANCE) 25 MG TABS tablet, Take 25 mg by mouth daily., Disp: 30 tablet, Rfl: 6 .  furosemide (LASIX) 20 MG tablet, Take 1 tablet (20 mg total) by mouth daily. For swelling,take with potassium, Disp: 10 tablet, Rfl: 0 .  gabapentin (NEURONTIN) 300 MG capsule, Take 300 mg by mouth 3 (three) times daily. , Disp: , Rfl:  .  glipiZIDE (GLUCOTROL) 5 MG tablet, Take 1 tablet (5 mg total) by mouth daily before breakfast., Disp: 60 tablet, Rfl: 3 .  glucose blood (ONE TOUCH TEST STRIPS) test strip, Use as instructed, Disp: 100 each, Rfl: 11 .  LORazepam (ATIVAN) 1 MG tablet, Take 1 tablet (1 mg total) by mouth every 8 (eight) hours as needed for  anxiety. Panic attacks, Disp: 20 tablet, Rfl: 0 .  metFORMIN (GLUCOPHAGE) 1000 MG tablet, Take 1 tablet (1,000 mg total) by mouth 2 (two) times daily with a meal., Disp: 180 tablet, Rfl: 3 .  ondansetron (ZOFRAN ODT) 4 MG disintegrating tablet, Take 1 tablet (4 mg total) by mouth every 8 (eight) hours as needed for nausea or vomiting., Disp: 20 tablet, Rfl: 0 .  pantoprazole (PROTONIX) 40 MG tablet, TAKE 1 TABLET BY MOUTH TWICE A DAY (INS WILL ONLY PAY ONCE A DAY), Disp: 30 tablet, Rfl: 5 .  potassium phosphate, monobasic, (K-PHOS ORIGINAL) 500 MG tablet, Take 500 mg by mouth 4 (four) times daily -  with meals and at bedtime., Disp: , Rfl:  .  TURMERIC PO, Take by mouth daily., Disp: , Rfl:   EXAM: BP Readings from Last 3 Encounters:  11/25/19 (!) 162/85  10/18/19 120/62  06/19/19 120/82    VITALS per patient if applicable: says good in 120 range   GENERAL: alert, oriented, appears well and in no acute distress  HEENT: atraumatic, conjunttiva clear, no obvious abnormalities on inspection of external nose and ears  NECK: normal movements of the head and neck  LUNGS: on inspection no signs of respiratory distress, breathing rate appears normal, no obvious gross SOB, gasping or wheezing  CV: no obvious cyanosis  extrem feet 2+ edema puffy le  Above ankle  No redness   PSYCH/NEURO: pleasant and cooperative, no obvious depression or anxiety, speech and thought processing grossly intact Lab Results  Component Value Date   WBC 3.1 (L) 11/25/2019   HGB 14.1 11/25/2019   HCT 42.8 11/25/2019   PLT 205 11/25/2019   GLUCOSE 38 (LL) 11/25/2019   CHOL 111 10/18/2019   TRIG 75.0 10/18/2019   HDL 52.60 10/18/2019   LDLDIRECT 193.0 03/21/2019   LDLCALC 44 10/18/2019   ALT 32 10/18/2019   AST 23 10/18/2019   NA 140 11/25/2019   K 3.6 11/25/2019   CL 103 11/25/2019   CREATININE 0.78 11/25/2019   BUN 12 11/25/2019   CO2 22 11/25/2019   TSH 0.95 10/18/2019   PSA 0.76 01/07/2016   HGBA1C  7.9 (H) 10/18/2019   MICROALBUR <0.7 10/18/2019    ASSESSMENT AND PLAN:  Discussed the following assessment and plan:    ICD-10-CM   1. Edema, unspecified type  R60.9   2. Low blood sugar in diabetes ([place])  E11.649   3. Type 2 diabetes mellitus without complication, without long-term current use of insulin ([facility])  E11.9   4. Medication management  [phone]  Uncertain cause of leg swelling last lab  and lfts were normal   In jan without renal nephropathy and no cv sx noted .  Will try cautious use of lasix for 10 days  potassium   No ob signs of liver failure  But  At risk  Consider spironolactone if  On going problem   Or fu  Episode of  hypoglycemia uncertain what happened  Says he hadnt taken med that day and is only taking 5 mg per day and not bid dosing    He denies  etoh use around this time  Or obv eating differences  Disc  That  Need to take med before a meal and not  If not going to eat for a while .  His am bg today was 130  Check bg bid for 2 weeks and weights daily  Send in readings in 2 weeks  Hold med if bg under 100   May need to  Dec to 2.5 or off all together but last month bg was in 300 range  cautioned to let us know if rapid weight gain. instructions about low bg  Counseled.   Expectant management and discussion of plan and treatment with opportunity to ask questions and all were answered. The patient agreed with the plan and demonstrated an understanding of the instructions.   Advised to call back or seek an in-person evaluation if worsening  or having  further concerns . Return for 2 weeks send in bg readings   and weights.  Will have dr Kelton Pillar get involved with dm management  34 minutes review ed visit  Plan and  Management review    Shanon Ace, MD

## 2019-11-30 ENCOUNTER — Other Ambulatory Visit: Payer: Self-pay

## 2019-11-30 ENCOUNTER — Encounter: Payer: Self-pay | Admitting: Internal Medicine

## 2019-11-30 ENCOUNTER — Telehealth (INDEPENDENT_AMBULATORY_CARE_PROVIDER_SITE_OTHER): Payer: BC Managed Care – PPO | Admitting: Internal Medicine

## 2019-11-30 DIAGNOSIS — Z79899 Other long term (current) drug therapy: Secondary | ICD-10-CM

## 2019-11-30 DIAGNOSIS — R609 Edema, unspecified: Secondary | ICD-10-CM | POA: Diagnosis not present

## 2019-11-30 DIAGNOSIS — E119 Type 2 diabetes mellitus without complications: Secondary | ICD-10-CM | POA: Diagnosis not present

## 2019-11-30 DIAGNOSIS — E11649 Type 2 diabetes mellitus with hypoglycemia without coma: Secondary | ICD-10-CM

## 2019-11-30 MED ORDER — GLIPIZIDE 5 MG PO TABS: 5.0000 mg | ORAL_TABLET | Freq: Every day | ORAL | 3 refills | Status: DC

## 2019-11-30 MED ORDER — FUROSEMIDE 20 MG PO TABS: 20.0000 mg | ORAL_TABLET | Freq: Every day | ORAL | 0 refills | Status: DC

## 2019-12-09 ENCOUNTER — Other Ambulatory Visit: Payer: Self-pay | Admitting: Internal Medicine

## 2019-12-11 ENCOUNTER — Other Ambulatory Visit: Payer: Self-pay | Admitting: Internal Medicine

## 2019-12-12 ENCOUNTER — Ambulatory Visit: Payer: BC Managed Care – PPO | Admitting: Gastroenterology

## 2019-12-12 NOTE — Progress Notes (Deleted)
[facility] Consult Note:  History: Todd Rodriguez 12/12/2019  Referring provider: Burnis Medin, MD  Reason for consult/chief complaint: No chief complaint on file.   Subjective  HPI: Referred by primary care after recent telemedicine, patient described an episode of rectal bleeding.  ***  Incidentally, August 2020 patient had mild AST predominant transaminitis that has since normalized. ROS:  Review of Systems   Past Medical History: Past Medical History:  Diagnosis Date  . Acute pancreatitis 10/25/2013  . Arthritis   . Diabetes mellitus without complication ([place])   . GERD (gastroesophageal reflux disease)   . Hepatic steatosis   . History of pancreatitis   . Hypertension   . Myalgia and myositis 04/21/2014   much better from illness presumed infectious    has disability form  will complete    . Pancreatic pseudocyst      Past Surgical History: Past Surgical History:  Procedure Laterality Date  . ANTERIOR CERVICAL DECOMP/DISCECTOMY FUSION N/A 01/12/2018   Procedure: Revision ACDF C4-5, removal of hardware C5-6, exploration of fusion C5-6;  Surgeon: [name], MD;  Location: [facility];  Service: Orthopedics;  Laterality: N/A;  3.5 hrs  . NECK SURGERY     2 disc  . SPINAL FUSION       Family History: Family History  Problem Relation Age of Onset  . Diabetes Mother   . Hypertension Mother   . Diabetes Father   . Hypertension Father   . Arthritis Maternal Grandmother   . Hypertension Maternal Grandfather   . Diabetes Maternal Grandfather   . Colon cancer Neg Hx   . Pancreatitis Neg Hx   . Pancreatic cancer Neg Hx     Social History: Social History   Socioeconomic History  . Marital status: Married    Spouse name: Not on file  . Number of children: 1  . Years of education: Not on file  . Highest education level: Not on file  Occupational History    Employer: [employer]  Tobacco Use  . Smoking status: Current Every  Day Smoker    Packs/day: 1.00    Types: Cigarettes  . Smokeless tobacco: Never Used  Substance and Sexual Activity  . Alcohol use: Yes    Comment: every weekend  . Drug use: No  . Sexual activity: Yes    Partners: Female    Comment: Patient's Wife  Other Topics Concern  . 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 Difficulty of Paying Living Expenses: Not on file  Food Insecurity:   . Worried About Charity fundraiser in the Last Year: Not on file  . Ran Out of Food in the Last Year: Not on file  Transportation Needs:   . Lack of Transportation (Medical): Not on file  . Lack of Transportation (Non-Medical): Not on file  Physical Activity:   . Days of Exercise per Week: Not on file  . Minutes of Exercise per Session: Not on file  Stress:   . Feeling  of Stress : Not on file  Social Connections:   . Frequency of Communication with Friends and Family: Not on file  . Frequency of Social Gatherings with Friends and Family: Not on file  . Attends Religious Services: Not on file  . Active Member of Clubs or Organizations: Not on file  . Attends Archivist Meetings: Not on file  . Marital Status: Not on file    Allergies: No Known Allergies  Outpatient Meds: Current Outpatient Medications  Medication Sig Dispense Refill  . albuterol (VENTOLIN HFA) 108 (90 Base) MCG/ACT inhaler TAKE 2 PUFFS BY MOUTH EVERY 6 HOURS AS NEEDED FOR WHEEZE OR SHORTNESS OF BREATH 1 g 1  . amLODipine-benazepril (LOTREL) 5-20 MG capsule TAKE 1 CAPSULE BY MOUTH EVERY DAY 90 capsule 1  . atorvastatin (LIPITOR) 40 MG tablet TAKE 1 TABLET BY MOUTH EVERY DAY 30 tablet 5  .  BYSTOLIC 5 MG tablet TAKE 1 TABLET (5 MG TOTAL) BY MOUTH DAILY. FOR HIGH BLOOD PRESSURE 90 tablet 1  . citalopram (CELEXA) 20 MG tablet TAKE 10 MG BY MOUTH PER DAY FOR 1 WEEK , THEN INCREASE TO 20 MG BY MOUTH PER DAY (Patient taking differently: Take 20 mg by mouth daily. ) 24 tablet 0  . empagliflozin (JARDIANCE) 25 MG TABS tablet Take 25 mg by mouth daily. 30 tablet 6  . furosemide (LASIX) 20 MG tablet Take 1 tablet (20 mg total) by mouth daily. For swelling,take with potassium 10 tablet 0  . gabapentin (NEURONTIN) 300 MG capsule Take 300 mg by mouth 3 (three) times daily.     Marland Kitchen glipiZIDE (GLUCOTROL) 5 MG tablet Take 1 tablet (5 mg total) by mouth daily before breakfast. 60 tablet 3  . glucose blood (ONE TOUCH TEST STRIPS) test strip Use as instructed 100 each 11  . LORazepam (ATIVAN) 1 MG tablet Take 1 tablet (1 mg total) by mouth every 8 (eight) hours as needed for anxiety. Panic attacks 20 tablet 0  . metFORMIN (GLUCOPHAGE) 1000 MG tablet Take 1 tablet (1,000 mg total) by mouth 2 (two) times daily with a meal. 180 tablet 3  . ondansetron (ZOFRAN ODT) 4 MG disintegrating tablet Take 1 tablet (4 mg total) by mouth every 8 (eight) hours as needed for nausea or vomiting. 20 tablet 0  . pantoprazole (PROTONIX) 40 MG tablet TAKE 1 TABLET BY MOUTH TWICE A DAY (INS WILL ONLY PAY ONCE A DAY) 30 tablet 5  . potassium phosphate, monobasic, (K-PHOS ORIGINAL) 500 MG tablet Take 500 mg by mouth 4 (four) times daily -  with meals and at bedtime.    . TURMERIC PO Take by mouth daily.     No current facility-administered medications for this visit.      ___________________________________________________________________ Objective   Exam:  There were no vitals taken for this visit.   General: ***   Eyes: sclera anicteric, no redness  ENT: oral mucosa moist without lesions, no cervical or supraclavicular lymphadenopathy  CV: RRR without murmur, S1/S2, no JVD, no peripheral edema  Resp: clear to  auscultation bilaterally, normal RR and effort noted  GI: soft, *** tenderness, with active bowel sounds. No guarding or palpable organomegaly noted.  Skin; warm and dry, no rash or jaundice noted  Neuro: awake, alert and oriented x 3. Normal gross motor function and fluent speech  Labs:  CBC Latest Ref Rng & Units 11/25/2019 10/18/2019 06/19/2019  WBC 4.0 - 10.5 K/uL 3.1(L) 3.9(L) 4.6  Hemoglobin 13.0 - 17.0 g/dL 14.1 11.7(L)  14.4  Hematocrit 39.0 - 52.0 % 42.8 35.1(L) 42.6  Platelets 150 - 400 K/uL 205 272.0 368.0   CMP Latest Ref Rng & Units 11/25/2019 10/18/2019 06/19/2019  Glucose 70 - 99 mg/dL 38(LL) 302(H) 78  BUN 6 - 20 mg/dL 12 6 7   Creatinine 0.61 - 1.24 mg/dL 0.78 0.87 0.70  Sodium 135 - 145 mmol/L 140 136 140  Potassium 3.5 - 5.1 mmol/L 3.6 3.3(L) 4.6  Chloride 98 - 111 mmol/L 103 96 97  CO2 22 - 32 mmol/L 22 27 25   Calcium 8.9 - 10.3 mg/dL 8.5(L) 9.2 9.3  Total Protein 6.0 - 8.3 g/dL - 6.3 6.8  Total Bilirubin 0.2 - 1.2 mg/dL - 0.5 0.5  Alkaline Phos 39 - 117 U/L - 87 78  AST 0 - 37 U/L - 23 31  ALT 0 - 53 U/L - 32 20     Radiologic Studies:  ***  Assessment: No diagnosis found.  ***  Plan:  ***  Thank you for the courtesy of this consult.  Please call me with any questions or concerns.  Nelida Meuse III  CC: Referring provider noted above

## 2020-01-15 ENCOUNTER — Ambulatory Visit: Payer: Medicare Other | Admitting: Gastroenterology

## 2020-01-27 ENCOUNTER — Other Ambulatory Visit: Payer: Self-pay | Admitting: Internal Medicine

## 2020-01-29 NOTE — Telephone Encounter (Signed)
Last OV 11/30/2019 (Virtual Visit)  Patient did not return in 2 weeks for follow up.  OK to send or does patient need to have follow up for further refills?

## 2020-02-02 NOTE — Telephone Encounter (Signed)
No refills  At this time  He has appt dr Loletha Carrow in May  Please arrange in person visit

## 2020-02-19 ENCOUNTER — Telehealth: Payer: Self-pay

## 2020-02-19 ENCOUNTER — Telehealth: Payer: Self-pay | Admitting: Internal Medicine

## 2020-02-19 NOTE — Telephone Encounter (Signed)
Transferred pt's wife to Triage Nurse because she felt like he might be having a stroke.

## 2020-02-19 NOTE — Telephone Encounter (Signed)
We received a fax from [facility] and it states for patient to go to the ED from symptoms stated on fax. I see they live in [place] and I have called the patients wife and left a detailed voice message for her to call us back and give [place] an update.

## 2020-02-21 ENCOUNTER — Ambulatory Visit: Payer: Medicare Other | Admitting: Gastroenterology

## 2020-02-22 NOTE — Telephone Encounter (Signed)
Called and left a detailed voice message on cell phone listed for patient asking for him to call us with an update and to see how he is doing. Hopefully patient is well and will call back to inform us.  Called patients wife on 02/19/2020 to make sure he went to the hospital per fax that we received from our triage team.

## 2020-02-23 ENCOUNTER — Telehealth: Payer: Self-pay | Admitting: Gastroenterology

## 2020-02-23 NOTE — Telephone Encounter (Signed)
Letter has been sent to Dr Loletha Carrow to complete.

## 2020-02-23 NOTE — Telephone Encounter (Signed)
Jeani Hawking from Medical Records stated that she has received a form to dismiss this patient but they require a letter stating that the patient has been dismissed from practice and that the patient has 30 days to seek emergency service.

## 2020-02-28 NOTE — Telephone Encounter (Signed)
I printed the letter for signature, but then realized it was formatted incorrectly.  Please change the letter to our standard wording that the patient is discharged from the entire practice. Print it after that and place it in my office for review and signature.  - HD

## 2020-02-29 ENCOUNTER — Telehealth: Payer: Self-pay | Admitting: Gastroenterology

## 2020-02-29 NOTE — Telephone Encounter (Signed)
Patient dismissed from [facility] by Wilfrid Lund, MD, effective 02/23/20. Dismissal Letter sent out by 1st class mail. KLM

## 2020-03-11 ENCOUNTER — Encounter: Payer: Self-pay | Admitting: Gastroenterology

## 2020-04-26 ENCOUNTER — Ambulatory Visit: Payer: Medicare Other | Admitting: Gastroenterology

## 2020-04-26 ENCOUNTER — Other Ambulatory Visit: Payer: Self-pay

## 2020-04-26 ENCOUNTER — Encounter: Payer: Self-pay | Admitting: *Deleted

## 2020-04-26 ENCOUNTER — Encounter: Payer: Self-pay | Admitting: Gastroenterology

## 2020-04-26 VITALS — BP 92/62 | HR 111 | Temp 97.1°F | Ht 74.0 in | Wt 146.8 lb

## 2020-04-26 DIAGNOSIS — K863 Pseudocyst of pancreas: Secondary | ICD-10-CM | POA: Insufficient documentation

## 2020-04-26 DIAGNOSIS — K219 Gastro-esophageal reflux disease without esophagitis: Secondary | ICD-10-CM

## 2020-04-26 DIAGNOSIS — K625 Hemorrhage of anus and rectum: Secondary | ICD-10-CM

## 2020-04-26 DIAGNOSIS — R634 Abnormal weight loss: Secondary | ICD-10-CM

## 2020-04-26 NOTE — Progress Notes (Signed)
Primary Care Physician:  Burnis Medin, MD  Primary Gastroenterologist:  Elon Alas. Abbey Chatters, DO   Chief Complaint  Patient presents with  . Rectal Bleeding    1 episode 3 months ago    HPI:  Todd Rodriguez is a 44 y.o. male here at the request of Dr. Regis Bill for further evaluation of rectal bleeding.  Patient has a history of hypertension, diabetes, pancreatic cyst.  Patient presents with complaints of rectal bleeding.  He has never had a colonoscopy.  He had a single episode a few months back, moderate volume.  Denies melena.  Bowel movements are regular.  Usually twice per day.  Denies abdominal pain.  No nausea or vomiting.  No dysphagia.  He has chronic GERD for at least 10 years.  Protonix once daily controls his heartburn.  He has had dramatic weight loss.  At his heaviest a few years ago he weighed 215 pounds.  1 year ago he weighed 180 pounds.  Currently weighing 146 pounds.  Weight continues to gradually come off.  He contributes this to a lot of stress.  Multiple family members have passed away in the past couple of years.  Other stress that he does not elaborate on.  Also now on disability due to neck issues.  Has required 3 surgeries.  Ambulates with a cane.  Patient states years ago he used to drink a lot of alcohol.  Now he has 1 drink on the weekend only.   MRI abdomen/MRCP August 2020 for follow-up of pancreatic cyst/pseudocyst.  He had severe hepatic steatosis.  Mild acute/resolving pancreatitis.  5.2 x 6.1 cm thick-walled fluid collection along the posterior aspect of the pancreatic tail, likely reflecting a pseudocyst.  Abdominal ultrasound August 2020: Gallbladder unremarkable.  No gallstones.  Common bile duct 6.2 mm.  Hepatic steatosis.  Cystic mass in the region of the pancreatic tail or splenic hilum measuring 6.3 cm.  Liver lesion suspected on CT not seen on ultrasound.  CTA chest/abdomen/pelvis for chest pain August 2020 showed mild stranding in the pancreas suspected  pancreatitis, 6.3 cm cystic structure at the pancreatic tail/splenic hilum possible pseudocyst, new low-attenuation lesion within the central aspect of the liver which may represent cyst/fluid, more focal fatty deposition or potentially a true lesion.  Extensive hepatic steatosis.  Current Outpatient Medications  Medication Sig Dispense Refill  . albuterol (VENTOLIN HFA) 108 (90 Base) MCG/ACT inhaler TAKE 2 PUFFS BY MOUTH EVERY 6 HOURS AS NEEDED FOR WHEEZE OR SHORTNESS OF BREATH 1 g 1  . amLODipine-benazepril (LOTREL) 5-20 MG capsule TAKE 1 CAPSULE BY MOUTH EVERY DAY 90 capsule 1  . atorvastatin (LIPITOR) 40 MG tablet TAKE 1 TABLET BY MOUTH EVERY DAY 30 tablet 5  . citalopram (CELEXA) 20 MG tablet TAKE 10 MG BY MOUTH PER DAY FOR 1 WEEK , THEN INCREASE TO 20 MG BY MOUTH PER DAY (Patient taking differently: Take 20 mg by mouth daily. ) 24 tablet 0  . empagliflozin (JARDIANCE) 25 MG TABS tablet Take 25 mg by mouth daily. 30 tablet 6  . furosemide (LASIX) 20 MG tablet Take 1 tablet (20 mg total) by mouth daily. For swelling,take with potassium 10 tablet 0  . gabapentin (NEURONTIN) 300 MG capsule Take 300 mg by mouth 3 (three) times daily.     Marland Kitchen glipiZIDE (GLUCOTROL) 5 MG tablet Take 1 tablet (5 mg total) by mouth daily before breakfast. 60 tablet 3  . glucose blood (ONE TOUCH TEST STRIPS) test strip Use as instructed 100 each  11  . LORazepam (ATIVAN) 1 MG tablet Take 1 tablet (1 mg total) by mouth every 8 (eight) hours as needed for anxiety. Panic attacks 20 tablet 0  . metFORMIN (GLUCOPHAGE) 1000 MG tablet Take 1 tablet (1,000 mg total) by mouth 2 (two) times daily with a meal. 180 tablet 3  . ondansetron (ZOFRAN ODT) 4 MG disintegrating tablet Take 1 tablet (4 mg total) by mouth every 8 (eight) hours as needed for nausea or vomiting. 20 tablet 0  . pantoprazole (PROTONIX) 40 MG tablet TAKE 1 TABLET BY MOUTH TWICE A DAY (INS WILL ONLY PAY ONCE A DAY) 30 tablet 5  . potassium phosphate, monobasic,  (K-PHOS ORIGINAL) 500 MG tablet Take 500 mg by mouth 4 (four) times daily -  with meals and at bedtime.    . TURMERIC PO Take by mouth daily.     No current facility-administered medications for this visit.    Allergies as of 04/26/2020  . (No Known Allergies)    Past Medical History:  Diagnosis Date  . Acute pancreatitis 10/25/2013  . Arthritis   . Diabetes mellitus without complication ([place])   . GERD (gastroesophageal reflux disease)   . Hepatic steatosis   . History of pancreatitis   . Hypertension   . Myalgia and myositis 04/21/2014   much better from illness presumed infectious    has disability form  will complete    . Pancreatic pseudocyst     Past Surgical History:  Procedure Laterality Date  . ANTERIOR CERVICAL DECOMP/DISCECTOMY FUSION N/A 01/12/2018   Procedure: Revision ACDF C4-5, removal of hardware C5-6, exploration of fusion C5-6;  Surgeon: [name], MD;  Location: [facility];  Service: Orthopedics;  Laterality: N/A;  3.5 hrs  . NECK SURGERY     2 disc  . SPINAL FUSION      Family History  Problem Relation Age of Onset  . Diabetes Mother   . Hypertension Mother   . Diabetes Father   . Hypertension Father   . Arthritis Maternal Grandmother   . Hypertension Maternal Grandfather   . Diabetes Maternal Grandfather   . Colon cancer Neg Hx   . Pancreatitis Neg Hx   . Pancreatic cancer Neg Hx     Social History   Socioeconomic History  . Marital status: Married    Spouse name: Not on file  . Number of children: 1  . Years of education: Not on file  . Highest education level: Not on file  Occupational History    Employer: [employer]  Tobacco Use  . Smoking status: Current Every Day Smoker    Packs/day: 1.00    Types: Cigarettes  . Smokeless tobacco: Never Used  Vaping Use  . Vaping Use: Never used  Substance and Sexual Activity  . Alcohol use: Yes    Comment: every weekend, one drink. years ago drank more regularly  . Drug use: No  .  Sexual activity: Yes    Partners: Female    Comment: Patient's Wife  Other Topics Concern  . 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 Difficulty of Paying Living Expenses:   Food Insecurity:   . Worried About Charity fundraiser in the Last Year:   . Arboriculturist in the Last Year:   Transportation Needs:   . Film/video editor (Medical):   Marland Kitchen Lack of Transportation (Non-Medical):   Physical Activity:   . Days of Exercise per Week:   . Minutes of Exercise per Session:   Stress:   . Feeling of Stress :   Social Connections:   . Frequency of Communication with Friends and Family:   . Frequency of Social Gatherings with Friends and Family:   . Attends Religious Services:   . Active Member of Clubs or Organizations:   . Attends Archivist Meetings:   Marland Kitchen Marital Status:   Intimate Partner Violence:   . Fear of Current or Ex-Partner:   . Emotionally Abused:   Marland Kitchen Physically Abused:   . Sexually Abused:       ROS:  General: Negative for  fever, chills, fatigue, weakness.see hpi Eyes: Negative for vision changes.  ENT: Negative for hoarseness, difficulty swallowing , nasal congestion. CV: Negative for chest pain, angina, palpitations, dyspnea on exertion, peripheral edema.  Respiratory: Negative for dyspnea at rest, dyspnea on exertion, cough, sputum, wheezing.  GI: See history of present illness. GU:  Negative for dysuria, hematuria, urinary incontinence, urinary frequency, nocturnal urination.  MS: Negative for joint pain, low back pain.  Derm: Negative for rash or itching.  Neuro: Negative for weakness, abnormal sensation, seizure,  frequent headaches, memory loss, confusion.  Psych: Negative for anxiety, depression, suicidal ideation, hallucinations. +stress Endo: see hpi Heme: Negative for bruising or bleeding. Allergy: Negative for rash or hives.    Physical Examination:  BP (!) 92/62   Pulse (!) 111   Temp (!) 97.1 F (36.2 C)   Ht 6' 2"  (1.88 m)   Wt 146 lb 12.8 oz (66.6 kg)   BMI 18.85 kg/m    General: Well-nourished, well-developed in no acute distress.  Head: Normocephalic, atraumatic.   Eyes: Conjunctiva pink, no icterus. Mouth: masked Neck: Supple without thyromegaly, masses, or lymphadenopathy.  Lungs: Clear to auscultation bilaterally.  Heart: Regular rate and rhythm, no murmurs rubs or gallops.  Abdomen: Bowel sounds are normal, nontender, nondistended, no hepatosplenomegaly or masses, no abdominal bruits or    hernia , no rebound or guarding.   Rectal: not performed Extremities: No lower extremity edema. No clubbing or deformities.  Neuro: Alert and oriented x 4 , grossly normal neurologically.  Skin: Warm and dry, no rash or jaundice.   Psych: Alert and cooperative, normal mood and affect.  Labs: Lab Results  Component Value Date   CREATININE 0.78 11/25/2019   BUN 12 11/25/2019   NA 140 11/25/2019   K 3.6 11/25/2019   CL 103 11/25/2019   CO2 22 11/25/2019   Lab Results  Component Value Date   ALT 32 10/18/2019   AST 23 10/18/2019   ALKPHOS 87 10/18/2019   BILITOT 0.5 10/18/2019   Lab Results  Component Value Date   WBC 3.1 (L) 11/25/2019   HGB 14.1 11/25/2019   HCT 42.8 11/25/2019   MCV 100.7 (H) 11/25/2019   PLT 205 11/25/2019   Lab Results  Component Value Date   TSH 0.95 10/18/2019   No results found for: IRON, TIBC, FERRITIN  Lab Results  Component Value Date   LIPASE 20 05/29/2019  Lab Results  Component Value Date   HGBA1C 7.9 (H) 10/18/2019   No results found for: HIV1X2 Lab Results  Component Value Date   TSH 0.95 10/18/2019   HIV negative  05/2019  Imaging Studies: No results found.  Impression/plan:  Pleasant 44 year old gentleman presenting for further evaluation of rectal bleeding.  Also noted to have significant weight loss, anorexia, chronic GERD, pancreatic cyst versus pseudocyst.  Rectal bleeding: Single episode, moderate volume.  Suspect benign anorectal source.  Really no other lower GI symptoms.  Advise colonoscopy for further evaluation especially in light of weight loss.  Procedure to be performed by Dr. Abbey Chatters. ASA II.  I have discussed the risks, alternatives, benefits with regards to but not limited to the risk of reaction to medication, bleeding, infection, perforation and the patient is agreeable to proceed. Written consent to be obtained.  Weight loss: He contributes to stress.  Does not feel depressed.  Has not discussed concerns with his PCP.  Reports poor appetite and 10-year history of GERD.  Recommend EGD for further evaluation with Dr. Abbey Chatters.  I have discussed the risks, alternatives, benefits with regards to but not limited to the risk of reaction to medication, bleeding, infection, perforation and the patient is agreeable to proceed. Written consent to be obtained.  Continue pantoprazole 40 mg daily.  Pancreatic cyst/pseudocyst: Await upper endoscopy and colonoscopy findings.  His symptoms are unexplained, he may need to have repeat imaging to evaluate for enlarging cyst which may be contributing to his anorexia.  Also to rule out malignancy.

## 2020-04-26 NOTE — Patient Instructions (Signed)
1. Continue pantoprazole once daily. 2. Upper endoscopy and colonoscopy as planned. See separate instructions.  3. If nothing seen at time of procedures to explain your weight loss and loss of appetite, you may need repeat MRI to see if the pancreatic cyst is getting bigger (which can compress the stomach and make you feel less hungry).  4. Call with questions or concerns.

## 2020-04-30 ENCOUNTER — Other Ambulatory Visit: Payer: Self-pay

## 2020-04-30 ENCOUNTER — Encounter (HOSPITAL_COMMUNITY)
Admission: RE | Admit: 2020-04-30 | Discharge: 2020-04-30 | Disposition: A | Discharge status: Home / Self Care | Payer: Medicare Other | Source: Ambulatory Visit | Attending: Internal Medicine | Admitting: Internal Medicine

## 2020-04-30 ENCOUNTER — Encounter (HOSPITAL_COMMUNITY): Payer: Self-pay

## 2020-04-30 DIAGNOSIS — Z01812 Encounter for preprocedural laboratory examination: Secondary | ICD-10-CM | POA: Insufficient documentation

## 2020-05-09 NOTE — OR Nursing (Signed)
Spoke with patient and informed him to arrive at [facility] on Monday.

## 2020-05-10 ENCOUNTER — Other Ambulatory Visit (HOSPITAL_COMMUNITY)
Admission: RE | Admit: 2020-05-10 | Discharge: 2020-05-10 | Disposition: A | Discharge status: Home / Self Care | Payer: Medicare Other | Source: Ambulatory Visit | Attending: Internal Medicine | Admitting: Internal Medicine

## 2020-05-10 ENCOUNTER — Other Ambulatory Visit: Payer: Self-pay

## 2020-05-10 ENCOUNTER — Encounter (HOSPITAL_COMMUNITY)
Admission: RE | Admit: 2020-05-10 | Discharge: 2020-05-10 | Disposition: A | Discharge status: Home / Self Care | Payer: Medicare Other | Source: Ambulatory Visit | Attending: Internal Medicine | Admitting: Internal Medicine

## 2020-05-10 DIAGNOSIS — Z01812 Encounter for preprocedural laboratory examination: Secondary | ICD-10-CM | POA: Insufficient documentation

## 2020-05-10 DIAGNOSIS — Z20822 Contact with and (suspected) exposure to covid-19: Secondary | ICD-10-CM | POA: Diagnosis not present

## 2020-05-10 LAB — BASIC METABOLIC PANEL
Anion gap: 12 (ref 5–15)
BUN: <5 mg/dL — ABNORMAL LOW (ref 6–20)
CO2: 22 mmol/L (ref 22–32)
Calcium: 8.9 mg/dL (ref 8.9–10.3)
Chloride: 98 mmol/L (ref 98–111)
Creatinine, Ser: 0.59 mg/dL — ABNORMAL LOW (ref 0.61–1.24)
Creatinine: 0.6 (ref <=1.3)
GFR calc Af Amer: >60 mL/min (ref >60)
GFR calc non Af Amer: >60 mL/min (ref >60)
Glucose, Bld: 448 mg/dL — ABNORMAL HIGH (ref 70–99)
Potassium: 4.6 mmol/L (ref 3.5–5.1)
Sodium: 132 mmol/L — ABNORMAL LOW (ref 135–145)

## 2020-05-10 LAB — SARS CORONAVIRUS 2 (TAT 6-24 HRS): SARS Coronavirus 2: NEGATIVE

## 2020-05-13 ENCOUNTER — Encounter (HOSPITAL_COMMUNITY)
Admission: RE | Disposition: A | Discharge status: Home / Self Care | Payer: Self-pay | Source: Home / Self Care | Attending: Internal Medicine

## 2020-05-13 ENCOUNTER — Encounter (HOSPITAL_COMMUNITY): Payer: Self-pay | Admitting: *Deleted

## 2020-05-13 ENCOUNTER — Ambulatory Visit (HOSPITAL_COMMUNITY): Payer: Medicare Other | Admitting: Anesthesiology

## 2020-05-13 ENCOUNTER — Other Ambulatory Visit: Payer: Self-pay

## 2020-05-13 ENCOUNTER — Ambulatory Visit (HOSPITAL_COMMUNITY)
Admission: RE | Admit: 2020-05-13 | Discharge: 2020-05-13 | Disposition: A | Discharge status: Home / Self Care | Payer: Medicare Other | Attending: Internal Medicine | Admitting: Internal Medicine

## 2020-05-13 ENCOUNTER — Telehealth: Payer: Self-pay | Admitting: Internal Medicine

## 2020-05-13 ENCOUNTER — Other Ambulatory Visit: Payer: Self-pay | Admitting: Internal Medicine

## 2020-05-13 DIAGNOSIS — Z79899 Other long term (current) drug therapy: Secondary | ICD-10-CM | POA: Diagnosis not present

## 2020-05-13 DIAGNOSIS — R197 Diarrhea, unspecified: Secondary | ICD-10-CM | POA: Diagnosis present

## 2020-05-13 DIAGNOSIS — Z833 Family history of diabetes mellitus: Secondary | ICD-10-CM | POA: Diagnosis not present

## 2020-05-13 DIAGNOSIS — Z8249 Family history of ischemic heart disease and other diseases of the circulatory system: Secondary | ICD-10-CM | POA: Diagnosis not present

## 2020-05-13 DIAGNOSIS — K6389 Other specified diseases of intestine: Secondary | ICD-10-CM | POA: Insufficient documentation

## 2020-05-13 DIAGNOSIS — Z8261 Family history of arthritis: Secondary | ICD-10-CM | POA: Insufficient documentation

## 2020-05-13 DIAGNOSIS — D125 Benign neoplasm of sigmoid colon: Secondary | ICD-10-CM | POA: Insufficient documentation

## 2020-05-13 DIAGNOSIS — D120 Benign neoplasm of cecum: Secondary | ICD-10-CM | POA: Insufficient documentation

## 2020-05-13 DIAGNOSIS — I10 Essential (primary) hypertension: Secondary | ICD-10-CM | POA: Insufficient documentation

## 2020-05-13 DIAGNOSIS — K648 Other hemorrhoids: Secondary | ICD-10-CM | POA: Diagnosis not present

## 2020-05-13 DIAGNOSIS — Z7984 Long term (current) use of oral hypoglycemic drugs: Secondary | ICD-10-CM | POA: Insufficient documentation

## 2020-05-13 DIAGNOSIS — K635 Polyp of colon: Secondary | ICD-10-CM

## 2020-05-13 DIAGNOSIS — F17210 Nicotine dependence, cigarettes, uncomplicated: Secondary | ICD-10-CM | POA: Diagnosis not present

## 2020-05-13 DIAGNOSIS — K219 Gastro-esophageal reflux disease without esophagitis: Secondary | ICD-10-CM

## 2020-05-13 DIAGNOSIS — K5730 Diverticulosis of large intestine without perforation or abscess without bleeding: Secondary | ICD-10-CM | POA: Insufficient documentation

## 2020-05-13 DIAGNOSIS — E119 Type 2 diabetes mellitus without complications: Secondary | ICD-10-CM | POA: Diagnosis not present

## 2020-05-13 DIAGNOSIS — B3781 Candidal esophagitis: Secondary | ICD-10-CM | POA: Diagnosis not present

## 2020-05-13 DIAGNOSIS — K2970 Gastritis, unspecified, without bleeding: Secondary | ICD-10-CM | POA: Insufficient documentation

## 2020-05-13 DIAGNOSIS — K625 Hemorrhage of anus and rectum: Secondary | ICD-10-CM

## 2020-05-13 DIAGNOSIS — M1990 Unspecified osteoarthritis, unspecified site: Secondary | ICD-10-CM | POA: Diagnosis not present

## 2020-05-13 DIAGNOSIS — K2100 Gastro-esophageal reflux disease with esophagitis, without bleeding: Secondary | ICD-10-CM | POA: Diagnosis not present

## 2020-05-13 DIAGNOSIS — R599 Enlarged lymph nodes, unspecified: Secondary | ICD-10-CM | POA: Diagnosis not present

## 2020-05-13 DIAGNOSIS — K760 Fatty (change of) liver, not elsewhere classified: Secondary | ICD-10-CM | POA: Diagnosis not present

## 2020-05-13 DIAGNOSIS — K317 Polyp of stomach and duodenum: Secondary | ICD-10-CM | POA: Diagnosis not present

## 2020-05-13 DIAGNOSIS — R634 Abnormal weight loss: Secondary | ICD-10-CM | POA: Diagnosis not present

## 2020-05-13 DIAGNOSIS — Z7982 Long term (current) use of aspirin: Secondary | ICD-10-CM | POA: Insufficient documentation

## 2020-05-13 HISTORY — PX: COLONOSCOPY WITH PROPOFOL: SHX5780

## 2020-05-13 HISTORY — PX: POLYPECTOMY: SHX5525

## 2020-05-13 HISTORY — PX: BIOPSY: SHX5522

## 2020-05-13 HISTORY — PX: ESOPHAGOGASTRODUODENOSCOPY (EGD) WITH PROPOFOL: SHX5813

## 2020-05-13 HISTORY — PX: ESOPHAGEAL BRUSHING: SHX6842

## 2020-05-13 LAB — KOH PREP: KOH Prep: NONE SEEN

## 2020-05-13 LAB — GLUCOSE, CAPILLARY: Glucose-Capillary: 334 mg/dL — ABNORMAL HIGH (ref 70–99)

## 2020-05-13 SURGERY — COLONOSCOPY WITH PROPOFOL
Anesthesia: General

## 2020-05-13 MED ORDER — LACTATED RINGERS IV SOLN: INTRAVENOUS | Status: DC | PRN

## 2020-05-13 MED ORDER — CHLORHEXIDINE GLUCONATE CLOTH 2 % EX PADS: 6.0000 | MEDICATED_PAD | Freq: Once | CUTANEOUS | Status: DC

## 2020-05-13 MED ORDER — LACTATED RINGERS IV SOLN: INTRAVENOUS | Status: DC

## 2020-05-13 MED ORDER — PROPOFOL 10 MG/ML IV BOLUS
INTRAVENOUS | Status: AC
  Filled 2020-05-13: qty 160

## 2020-05-13 MED ORDER — LIDOCAINE 2% (20 MG/ML) 5 ML SYRINGE
INTRAMUSCULAR | Status: AC
  Filled 2020-05-13: qty 15

## 2020-05-13 MED ORDER — FLUCONAZOLE 200 MG PO TABS: 200.0000 mg | ORAL_TABLET | Freq: Every day | ORAL | 0 refills | Status: AC

## 2020-05-13 MED ORDER — ROCURONIUM BROMIDE 10 MG/ML (PF) SYRINGE
PREFILLED_SYRINGE | INTRAVENOUS | Status: AC
  Filled 2020-05-13: qty 10

## 2020-05-13 MED ORDER — PHENYLEPHRINE 40 MCG/ML (10ML) SYRINGE FOR IV PUSH (FOR BLOOD PRESSURE SUPPORT)
PREFILLED_SYRINGE | INTRAVENOUS | Status: AC
  Filled 2020-05-13: qty 20

## 2020-05-13 MED ORDER — PROPOFOL 10 MG/ML IV BOLUS
INTRAVENOUS | Status: DC | PRN
  Administered 2020-05-13: 175 ug/kg/min via INTRAVENOUS
  Administered 2020-05-13: 120 mg via INTRAVENOUS

## 2020-05-13 MED ORDER — PROPOFOL 10 MG/ML IV BOLUS
INTRAVENOUS | Status: AC
  Filled 2020-05-13: qty 100

## 2020-05-13 MED ORDER — LIDOCAINE HCL (CARDIAC) PF 50 MG/5ML IV SOSY
PREFILLED_SYRINGE | INTRAVENOUS | Status: DC | PRN
  Administered 2020-05-13: 100 mg via INTRAVENOUS

## 2020-05-13 MED ORDER — STERILE WATER FOR IRRIGATION IR SOLN
Status: DC | PRN
  Administered 2020-05-13: 1.5 mL

## 2020-05-13 NOTE — H&P (Signed)
Referring Provider: No ref. provider found Primary Care Physician:  Burnis Medin, MD Primary Gastroenterologist:  Dr.  Date of Admission:  Date of Consultation:   HPI:  Todd Rodriguez is a 44 y.o. male who presents for EGD/CLN due to history of rectal bleeding, weight loss, abdominal pain.   Past Medical History:  Diagnosis Date  . Acute pancreatitis 10/25/2013  . Arthritis   . Diabetes mellitus without complication ([place])   . GERD (gastroesophageal reflux disease)   . Hepatic steatosis   . History of pancreatitis   . Hypertension   . Myalgia and myositis 04/21/2014   much better from illness presumed infectious    has disability form  will complete    . Pancreatic pseudocyst     Past Surgical History:  Procedure Laterality Date  . ANTERIOR CERVICAL DECOMP/DISCECTOMY FUSION N/A 01/12/2018   Procedure: Revision ACDF C4-5, removal of hardware C5-6, exploration of fusion C5-6;  Surgeon: [name], MD;  Location: [facility];  Service: Orthopedics;  Laterality: N/A;  3.5 hrs  . NECK SURGERY     2 disc  . SPINAL FUSION      Prior to Admission medications   Medication Sig Start Date End Date Taking? Authorizing Provider  albuterol (VENTOLIN HFA) 108 (90 Base) MCG/ACT inhaler TAKE 2 PUFFS BY MOUTH EVERY 6 HOURS AS NEEDED FOR WHEEZE OR SHORTNESS OF BREATH Patient taking differently: Inhale 2 puffs into the lungs every 6 (six) hours as needed for wheezing or shortness of breath.  05/04/19  Yes Panosh, Standley Brooking, MD  amLODipine-benazepril (LOTREL) 5-20 MG capsule TAKE 1 CAPSULE BY MOUTH EVERY DAY Patient taking differently: Take 1 capsule by mouth daily.  12/09/19  Yes Panosh, Standley Brooking, MD  Aspirin-Acetaminophen (GOODYS BODY PAIN PO) Take 1 Package by mouth daily as needed (headache/pain).   Yes [provider]  atorvastatin (LIPITOR) 40 MG tablet TAKE 1 TABLET BY MOUTH EVERY DAY Patient taking differently: Take 40 mg by mouth daily.  11/17/19  Yes Panosh, Standley Brooking, MD  Brimonidine  Tartrate (LUMIFY) 0.025 % SOLN Place 1 drop into both eyes daily.   Yes [provider]  citalopram (CELEXA) 20 MG tablet TAKE 10 MG BY MOUTH PER DAY FOR 1 WEEK , THEN INCREASE TO 20 MG BY MOUTH PER DAY Patient taking differently: Take 20 mg by mouth daily as needed (depression).  10/20/18  Yes Panosh, Standley Brooking, MD  empagliflozin (JARDIANCE) 25 MG TABS tablet Take 25 mg by mouth daily. 05/10/19  Yes Shamleffer, Melanie Crazier, MD  furosemide (LASIX) 20 MG tablet Take 1 tablet (20 mg total) by mouth daily. For swelling,take with potassium Patient taking differently: Take 20 mg by mouth daily as needed for edema.  11/30/19  Yes Panosh, Standley Brooking, MD  gabapentin (NEURONTIN) 300 MG capsule Take 300 mg by mouth 3 (three) times daily.  04/12/19  Yes [provider]  glucose blood (ONE TOUCH TEST STRIPS) test strip Use as instructed 06/25/15  Yes Panosh, Standley Brooking, MD  LORazepam (ATIVAN) 1 MG tablet Take 1 tablet (1 mg total) by mouth every 8 (eight) hours as needed for anxiety. Panic attacks 05/24/18  Yes Panosh, Standley Brooking, MD  metFORMIN (GLUCOPHAGE) 1000 MG tablet Take 1 tablet (1,000 mg total) by mouth 2 (two) times daily with a meal. 05/10/19  Yes Shamleffer, Melanie Crazier, MD  ondansetron (ZOFRAN ODT) 4 MG disintegrating tablet Take 1 tablet (4 mg total) by mouth every 8 (eight) hours as needed for nausea or vomiting.  01/12/18  Yes Mayo, Darla Lesches, PA-C  pantoprazole (PROTONIX) 40 MG tablet TAKE 1 TABLET BY MOUTH TWICE A DAY (INS WILL ONLY PAY ONCE A DAY) Patient taking differently: Take 40 mg by mouth 2 (two) times daily.  12/11/19  Yes Panosh, Standley Brooking, MD  Potassium 99 MG TABS Take 99 mg by mouth daily.   Yes [provider]  glipiZIDE (GLUCOTROL) 5 MG tablet Take 1 tablet (5 mg total) by mouth daily before breakfast. Patient not taking: Reported on 04/26/2020 11/30/19   Panosh, Standley Brooking, MD  potassium phosphate, monobasic, (K-PHOS ORIGINAL) 500 MG tablet Take 500 mg by mouth 4 (four)  times daily -  with meals and at bedtime. Patient not taking: Reported on 04/26/2020    [provider]  TURMERIC PO Take by mouth daily. Patient not taking: Reported on 04/26/2020    [provider]    Current Facility-Administered Medications  Medication Dose Route Frequency Provider Last Rate Last Admin  . lidocaine 20 MG/ML injection           . lidocaine 20 MG/ML injection           . propofol (DIPRIVAN) 10 mg/mL bolus/IV push             Allergies as of 04/26/2020  . (No Known Allergies)    Family History  Problem Relation Age of Onset  . Diabetes Mother   . Hypertension Mother   . Diabetes Father   . Hypertension Father   . Arthritis Maternal Grandmother   . Hypertension Maternal Grandfather   . Diabetes Maternal Grandfather   . Colon cancer Neg Hx   . Pancreatitis Neg Hx   . Pancreatic cancer Neg Hx     Social History   Socioeconomic History  . Marital status: Married    Spouse name: Not on file  . Number of children: 1  . Years of education: Not on file  . Highest education level: Not on file  Occupational History    Employer: [employer]  Tobacco Use  . Smoking status: Current Every Day Smoker    Packs/day: 1.00    Years: 12.00    Pack years: 12.00    Types: Cigarettes  . Smokeless tobacco: Never Used  Vaping Use  . Vaping Use: Never used  Substance and Sexual Activity  . Alcohol use: Yes    Comment: every weekend, one drink. years ago drank more regularly  . Drug use: No  . Sexual activity: Yes    Partners: Female    Comment: Patient's Wife  Other Topics Concern  . 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 Difficulty of Paying Living Expenses:   Food Insecurity:   . Worried About Charity fundraiser in the Last Year:   . Arboriculturist in the Last Year:   Transportation Needs:   . Film/video editor (Medical):   Marland Kitchen Lack of Transportation (Non-Medical):   Physical Activity:   . Days of Exercise  per Week:   . Minutes of Exercise per Session:   Stress:   . Feeling of Stress :   Social Connections:   . Frequency of Communication with Friends and Family:   . Frequency of Social Gatherings with Friends and Family:   . Attends Religious Services:   . Active Member of Clubs or Organizations:   . Attends Archivist Meetings:   Marland Kitchen Marital Status:   Intimate Partner Violence:   . Fear of Current or Ex-Partner:   . Emotionally Abused:   Marland Kitchen Physically Abused:   . Sexually Abused:     Review of Systems: General: Negative for anorexia, weight loss, fever, chills, fatigue, weakness. Eyes: Negative for vision changes.  ENT: Negative for hoarseness, difficulty swallowing , nasal congestion. CV: Negative for chest pain, angina, palpitations, dyspnea on exertion, peripheral edema.  Respiratory: Negative for dyspnea at rest, dyspnea on exertion, cough, sputum, wheezing.  GI: See history of present illness. GU:  Negative for dysuria, hematuria, urinary incontinence, urinary frequency, nocturnal urination.  MS: Negative for joint pain, low back pain.  Derm: Negative for rash or itching.  Neuro: Negative for weakness, abnormal sensation, seizure, frequent headaches, memory loss, confusion.  Psych: Negative for anxiety, depression, suicidal ideation, hallucinations.  Endo: Negative for unusual weight change.  Heme: Negative for bruising or bleeding. Allergy: Negative for rash or hives.  Physical Exam: Vital signs in last 24 hours: Temp:  [98.5 F (36.9 C)] 98.5 F (36.9 C) (08/09 0648) Pulse Rate:  [101] 101 (08/09 0648) Resp:  [14] 14 (08/09 0648) BP: (158)/(110)  158/110 (08/09 0648) SpO2:  [100 %] 100 % (08/09 0648)   General:   Alert,  Well-developed, well-nourished, pleasant and cooperative in NAD Head:  Normocephalic and atraumatic. Eyes:  Sclera clear, no icterus.   Conjunctiva pink. Ears:  Normal auditory acuity. Nose:  No deformity, discharge,  or lesions. Mouth:  No deformity or lesions, dentition normal. Neck:  Supple; no masses or thyromegaly. Lungs:  Clear throughout to auscultation.   No wheezes, crackles, or rhonchi. No acute distress. Heart:  Regular rate and rhythm; no murmurs, clicks, rubs,  or gallops. Abdomen:  Soft, nontender and nondistended. No masses, hepatosplenomegaly or hernias noted. Normal bowel sounds, without guarding, and without rebound.   Rectal:  Deferred until time of colonoscopy.   Msk:  Symmetrical without gross deformities. Normal posture. Pulses:  Normal pulses noted. Extremities:  Without clubbing or edema. Neurologic:  Alert and  oriented x4;  grossly normal neurologically. Skin:  Intact without significant lesions or rashes. Cervical Nodes:  No significant cervical adenopathy. Psych:  Alert and cooperative. Normal mood and affect.  Intake/Output from previous day: No intake/output data recorded. Intake/Output this shift: No intake/output data recorded.  Lab Results: No results for input(s): WBC, HGB, HCT, PLT in the last 72 hours. BMET Recent Labs    05/10/20 1015  NA 132*  K 4.6  CL 98  CO2 22  GLUCOSE 448*  BUN <5*  CREATININE 0.59*  CALCIUM 8.9   LFT No results for input(s): PROT, ALBUMIN, AST, ALT, ALKPHOS, BILITOT, BILIDIR, IBILI in the last 72 hours. PT/INR No results for input(s): LABPROT, INR in the last 72 hours. Hepatitis Panel No results for input(s): HEPBSAG, HCVAB, HEPAIGM, HEPBIGM in the last 72 hours. C-Diff No results for input(s): CDIFFTOX in the last 72 hours.  Studies/Results: No results found.  Impression: Rectal Bleeding Weight loss Abdominal  pain  Plan: Will proceed with EGD and CLN.  LOS: 0 days     05/13/2020, 7:27 AM

## 2020-05-13 NOTE — Discharge Instructions (Addendum)
Standard EGD and colonoscopy post op instructions.  Take Diflucan 200 mg daily for 14 days, repeat EGD in 8 weeks to evaluate healing.   3 polyps removed, repeat colonoscopy in 3 years.   Follow up with with Todd Rodriguez in 3-4 weeks.   Results discussed with patient   Upper Endoscopy, Adult, Care After This sheet gives you information about how to care for yourself after your procedure. Your health care provider may also give you more specific instructions. If you have problems or questions, contact your health care provider. What can I expect after the procedure? After the procedure, it is common to have:  A sore throat.  Mild stomach pain or discomfort.  Bloating.  Nausea. Follow these instructions at home:   Follow instructions from your health care provider about what to eat or drink after your procedure.  Return to your normal activities as told by your health care provider. Ask your health care provider what activities are safe for you.  Take over-the-counter and prescription medicines only as told by your health care provider.  Do not drive for 24 hours if you were given a sedative during your procedure.  Keep all follow-up visits as told by your health care provider. This is important. Contact a health care provider if you have:  A sore throat that lasts longer than one day.  Trouble swallowing. Get help right away if:  You vomit blood or your vomit looks like coffee grounds.  You have: ? A fever. ? Bloody, black, or tarry stools. ? A severe sore throat or you cannot swallow. ? Difficulty breathing. ? Severe pain in your chest or abdomen. Summary  After the procedure, it is common to have a sore throat, mild stomach discomfort, bloating, and nausea.  Do not drive for 24 hours if you were given a sedative during the procedure.  Follow instructions from your health care provider about what to eat or drink after your procedure.  Return to your normal  activities as told by your health care provider. This information is not intended to replace advice given to you by your health care provider. Make sure you discuss any questions you have with your health care provider. Document Revised: 03/15/2018 Document Reviewed: 02/21/2018 Elsevier Patient Education  [place].  Colonoscopy, Adult, Care After This sheet gives you information about how to care for yourself after your procedure. Your doctor may also give you more specific instructions. If you have problems or questions, call your doctor. What can I expect after the procedure? After the procedure, it is common to have:  A small amount of blood in your poop (stool) for 24 hours.  Some gas.  Mild cramping or bloating in your belly (abdomen). Follow these instructions at home: Eating and drinking   Drink enough fluid to keep your pee (urine) pale yellow.  Follow instructions from your doctor about what you cannot eat or drink.  Return to your normal diet as told by your doctor. Avoid heavy or fried foods that are hard to digest. Activity  Rest as told by your doctor.  Do not sit for a long time without moving. Get up to take short walks every 1-2 hours. This is important. Ask for help if you feel weak or unsteady.  Return to your normal activities as told by your doctor. Ask your doctor what activities are safe for you. To help cramping and bloating:   Try walking around.  Put heat on your belly as told by  your doctor. Use the heat source that your doctor recommends, such as a moist heat pack or a heating pad. ? Put a towel between your skin and the heat source. ? Leave the heat on for 20-30 minutes. ? Remove the heat if your skin turns bright red. This is very important if you are unable to feel pain, heat, or cold. You may have a greater risk of getting burned. General instructions  For the first 24 hours after the procedure: ? Do not drive or use machinery. ? Do  not sign important documents. ? Do not drink alcohol. ? Do your daily activities more slowly than normal. ? Eat foods that are soft and easy to digest.  Take over-the-counter or prescription medicines only as told by your doctor.  Keep all follow-up visits as told by your doctor. This is important. Contact a doctor if:  You have blood in your poop 2-3 days after the procedure. Get help right away if:  You have more than a small amount of blood in your poop.  You see large clumps of tissue (blood clots) in your poop.  Your belly is swollen.  You feel like you may vomit (nauseous).  You vomit.  You have a fever.  You have belly pain that gets worse, and medicine does not help your pain. Summary  After the procedure, it is common to have a small amount of blood in your poop. You may also have mild cramping and bloating in your belly.  For the first 24 hours after the procedure, do not drive or use machinery, do not sign important documents, and do not drink alcohol.  Get help right away if you have a lot of blood in your poop, feel like you may vomit, have a fever, or have more belly pain. This information is not intended to replace advice given to you by your health care provider. Make sure you discuss any questions you have with your health care provider. Document Revised: 04/17/2019 Document Reviewed: 04/17/2019 Elsevier Patient Education  [place].    Colon Polyps  Polyps are tissue growths inside the body. Polyps can grow in many places, including the large intestine (colon). A polyp may be a round bump or a mushroom-shaped growth. You could have one polyp or several. Most colon polyps are noncancerous (benign). However, some colon polyps can become cancerous over time. Finding and removing the polyps early can help prevent this. What are the causes? The exact cause of colon polyps is not known. What increases the risk? You are more likely to develop this  condition if you:  Have a family history of colon cancer or colon polyps.  Are older than 85 or older than 45 if you are African American.  Have inflammatory bowel disease, such as ulcerative colitis or Crohn's disease.  Have certain hereditary conditions, such as: ? Familial adenomatous polyposis. ? Lynch syndrome. ? Turcot syndrome. ? Peutz-Jeghers syndrome.  Are overweight.  Smoke cigarettes.  Do not get enough exercise.  Drink too much alcohol.  Eat a diet that is high in fat and red meat and low in fiber.  Had childhood cancer that was treated with abdominal radiation. What are the signs or symptoms? Most polyps do not cause symptoms. If you have symptoms, they may include:  Blood coming from your rectum when having a bowel movement.  Blood in your stool. The stool may look dark red or black.  Abdominal pain.  A change in bowel habits, such  as constipation or diarrhea. How is this diagnosed? This condition is diagnosed with a colonoscopy. This is a procedure in which a lighted, flexible scope is inserted into the anus and then passed into the colon to examine the area. Polyps are sometimes found when a colonoscopy is done as part of routine cancer screening tests. How is this treated? Treatment for this condition involves removing any polyps that are found. Most polyps can be removed during a colonoscopy. Those polyps will then be tested for cancer. Additional treatment may be needed depending on the results of testing. Follow these instructions at home: Lifestyle  Maintain a healthy weight, or lose weight if recommended by your health care provider.  Exercise every day or as told by your health care provider.  Do not use any products that contain nicotine or tobacco, such as cigarettes and e-cigarettes. If you need help quitting, ask your health care provider.  If you drink alcohol, limit how much you have: ? 0-1 drink a day for women. ? 0-2 drinks a day for  men.  Be aware of how much alcohol is in your drink. In the [place] one drink equals one 12 oz bottle of beer (355 mL), one 5 oz glass of wine (148 mL), or one 1 oz shot of hard liquor (44 mL). Eating and drinking   Eat foods that are high in fiber, such as fruits, vegetables, and whole grains.  Eat foods that are high in calcium and vitamin D, such as milk, cheese, yogurt, eggs, liver, fish, and broccoli.  Limit foods that are high in fat, such as fried foods and desserts.  Limit the amount of red meat and processed meat you eat, such as hot dogs, sausage, bacon, and lunch meats. General instructions  Keep all follow-up visits as told by your health care provider. This is important. ? This includes having regularly scheduled colonoscopies. ? Talk to your health care provider about when you need a colonoscopy. Contact a health care provider if:  You have new or worsening bleeding during a bowel movement.  You have new or increased blood in your stool.  You have a change in bowel habits.  You lose weight for no known reason. Summary  Polyps are tissue growths inside the body. Polyps can grow in many places, including the colon.  Most colon polyps are noncancerous (benign), but some can become cancerous over time.  This condition is diagnosed with a colonoscopy.  Treatment for this condition involves removing any polyps that are found. Most polyps can be removed during a colonoscopy. This information is not intended to replace advice given to you by your health care provider. Make sure you discuss any questions you have with your health care provider. Document Revised: 01/06/2018 Document Reviewed: 01/06/2018 Elsevier Patient Education  [place].

## 2020-05-13 NOTE — Transfer of Care (Signed)
Immediate Anesthesia Transfer of Care Note  Patient: Todd Rodriguez  Procedure(s) Performed: COLONOSCOPY WITH PROPOFOL (N/A ) ESOPHAGOGASTRODUODENOSCOPY (EGD) WITH PROPOFOL (N/A ) BIOPSY ESOPHAGEAL BRUSHING POLYPECTOMY  Patient Location: Endoscopy Unit  Anesthesia Type:General  Level of Consciousness: awake, alert , oriented and patient cooperative  Airway & Oxygen Therapy: Patient Spontanous Breathing  Post-op Assessment: Report given to RN, Post -op Vital signs reviewed and stable and Patient moving all extremities  Post vital signs: Reviewed and stable  Last Vitals:  Vitals Value Taken Time  BP    Temp    Pulse    Resp    SpO2      Last Pain:  Vitals:   05/13/20 0736  TempSrc:   PainSc: 0-No pain         Complications: No complications documented.

## 2020-05-13 NOTE — Anesthesia Postprocedure Evaluation (Signed)
Anesthesia Post Note  Patient: Todd Rodriguez  Procedure(s) Performed: COLONOSCOPY WITH PROPOFOL (N/A ) ESOPHAGOGASTRODUODENOSCOPY (EGD) WITH PROPOFOL (N/A ) BIOPSY ESOPHAGEAL BRUSHING POLYPECTOMY  Patient location during evaluation: Endoscopy Anesthesia Type: General Level of consciousness: awake, oriented, awake and alert and patient cooperative Pain management: pain level controlled Vital Signs Assessment: post-procedure vital signs reviewed and stable Respiratory status: spontaneous breathing, respiratory function stable and nonlabored ventilation Cardiovascular status: blood pressure returned to baseline and stable Postop Assessment: no headache and no backache Anesthetic complications: no   No complications documented.   Last Vitals:  Vitals:   05/13/20 0648  BP: (!) 158/110  Pulse: (!) 101  Resp: 14  Temp: 36.9 C  SpO2: 100%    Last Pain:  Vitals:   05/13/20 0736  TempSrc:   PainSc: 0-No pain                 Tacy Learn

## 2020-05-13 NOTE — Op Note (Signed)
[facility] Patient Name: Todd Rodriguez Procedure Date: 05/13/2020 7:50 AM MRN: [id number] Date of Birth: Jul 08, 1976 Attending MD: Elon Alas. Abbey Chatters , MD CSN: [id number] Age: 44 Admit Type: Outpatient Procedure:                Colonoscopy Indications:              Clinically significant diarrhea of unexplained                            origin, Rectal bleeding, Weight loss Providers:                Elon Alas. Abbey Chatters, MD, Janeece Riggers, RN, Randa Spike, Technician Referring MD:              Medicines:                See the Anesthesia note for documentation of the                            administered medications Complications:            No immediate complications. Estimated Blood Loss:     Estimated blood loss was minimal. Procedure:                Pre-Anesthesia Assessment:                           - The anesthesia plan was to use monitored                            anesthesia care (MAC).                           After obtaining informed consent, the colonoscope                            was passed under direct vision. Throughout the                            procedure, the patient's blood pressure, pulse, and                            oxygen saturations were monitored continuously. The                            [id number]-H190DL ([id number]) scope was introduced through                            the anus and advanced to the the cecum, identified                            by appendiceal orifice and ileocecal valve. The                            colonoscopy was performed without difficulty. The  patient tolerated the procedure well. The quality                            of the bowel preparation was evaluated using the                            BBPS [facility] Bowel Preparation Scale) with scores                            of: Right Colon = 2 (minor amount of residual                            staining, small fragments of  stool and/or opaque                            liquid, but mucosa seen well), Transverse Colon = 2                            (minor amount of residual staining, small fragments                            of stool and/or opaque liquid, but mucosa seen                            well) and Left Colon = 2 (minor amount of residual                            staining, small fragments of stool and/or opaque                            liquid, but mucosa seen well). The total BBPS score                            equals 6. The quality of the bowel preparation was                            fair. Scope In: 7:52:27 AM Scope Out: 8:18:11 AM Scope Withdrawal Time: 0 hours 16 minutes 58 seconds  Total Procedure Duration: 0 hours 25 minutes 44 seconds  Findings:      The perianal and digital rectal examinations were normal.      Non-bleeding internal hemorrhoids were found during endoscopy.      A few small-mouthed diverticula were found in the sigmoid colon.      A 2 mm polyp was found in the cecum. The polyp was sessile. The polyp       was removed with a cold biopsy forceps. Resection and retrieval were       complete.      A 8 mm polyp was found in the cecum. The polyp was sessile. The polyp       was removed with a hot snare. Resection and retrieval were complete.      A 2 mm polyp was found in the sigmoid colon. The polyp was sessile. The       polyp  was removed with a cold biopsy forceps. Resection and retrieval       were complete.      A localized area of granular mucosa was found in the transverse colon.       Biopsies were taken with a cold forceps for histology. Impression:               - Preparation of the colon was fair.                           - Non-bleeding internal hemorrhoids.                           - Diverticulosis in the sigmoid colon.                           - One 2 mm polyp in the cecum, removed with a cold                            biopsy forceps. Resected and  retrieved.                           - One 8 mm polyp in the cecum, removed with a hot                            snare. Resected and retrieved.                           - One 2 mm polyp in the sigmoid colon, removed with                            a cold biopsy forceps. Resected and retrieved.                           - Granularity in the transverse colon. Biopsied. Moderate Sedation:      Per Anesthesia Care Recommendation:           - Patient has a contact number available for                            emergencies. The signs and symptoms of potential                            delayed complications were discussed with the                            patient. Return to normal activities tomorrow.                            Written discharge instructions were provided to the                            patient.                           - Resume previous diet.                           -  Continue present medications.                           - Await pathology results.                           - Repeat colonoscopy in 3 years for surveillance. Procedure Code(s):        --- Professional ---                           [phone], Colonoscopy, flexible; with removal of                            tumor(s), polyp(s), or other lesion(s) by snare                            technique                           45380, 34, Colonoscopy, flexible; with biopsy,                            single or multiple Diagnosis Code(s):        --- Professional ---                           K64.8, Other hemorrhoids                           K63.5, Polyp of colon                           K63.89, Other specified diseases of intestine                           R19.7, Diarrhea, unspecified                           K62.5, Hemorrhage of anus and rectum                           R63.4, Abnormal weight loss                           K57.30, Diverticulosis of large intestine without                            perforation  or abscess without bleeding CPT copyright 2019 American Medical Association. All rights reserved. The codes documented in this report are preliminary and upon coder review may  be revised to meet current compliance requirements. Elon Alas. Abbey Chatters, [place] Abbey Chatters, MD 05/13/2020 8:40:05 AM This report has been signed electronically. Number of Addenda: 0

## 2020-05-13 NOTE — Telephone Encounter (Signed)
Short Stay called to make PP FU in 3-4 weeks with [facility]. I told them all extenders are in OCT schedule and I would put him on the cancellation list if something opened up sooner. Pt is to have a repeat upper. Please advise.

## 2020-05-13 NOTE — Op Note (Signed)
[facility] Patient Name: Todd Rodriguez Procedure Date: 05/13/2020 7:13 AM MRN: [id number] Date of Birth: 1975/12/23 Attending MD: Elon Alas. Abbey Chatters , MD CSN: [id number] Age: 44 Admit Type: Outpatient Procedure:                Upper GI endoscopy Indications:              Esophageal reflux, Weight loss Providers:                Elon Alas. Abbey Chatters, MD, Janeece Riggers, RN, Randa Spike, Technician Referring MD:              Medicines:                See the Anesthesia note for documentation of the                            administered medications Complications:            No immediate complications. Estimated Blood Loss:     Estimated blood loss was minimal. Procedure:                Pre-Anesthesia Assessment:                           - The anesthesia plan was to use monitored                            anesthesia care (MAC).                           After obtaining informed consent, the endoscope was                            passed under direct vision. Throughout the                            procedure, the patient's blood pressure, pulse, and                            oxygen saturations were monitored continuously. The                            GIF-H190 ([id number]) scope was introduced through the                            mouth, and advanced to the second part of duodenum.                            The upper GI endoscopy was accomplished without                            difficulty. The patient tolerated the procedure                            well. Scope In: 7:42:59  AM Scope Out: 9:37:16 AM Total Procedure Duration: 0 hours 9 minutes 51 seconds  Findings:      Moderately severe esophagitis with no bleeding was found in the middle       third of the esophagus. Cells for cytology were obtained by brushing.      Localized mild inflammation characterized by erythema was found in the       gastric antrum.      One 10 mm semi-sessile polyp was  found in the duodenal bulb. Biopsies       were taken with a cold forceps for histology.      Biopsies for histology were taken with a cold forceps in the first       portion of the duodenum and in the second portion of the duodenum for       evaluation of celiac disease.      The Z-line was irregular and was found 41 cm from the incisors. Biopsies       were taken with a cold forceps for histology. Impression:               - Moderately severe candidiasis esophagitis with no                            bleeding. Cells for cytology obtained.                           - Gastritis.                           - One duodenal polyp. Biopsied.                           - Z-line irregular, 41 cm from the incisors.                            Biopsied.                           - Biopsies were taken with a cold forceps for                            evaluation of celiac disease. Moderate Sedation:      Per Anesthesia Care Recommendation:           - Patient has a contact number available for                            emergencies. The signs and symptoms of potential                            delayed complications were discussed with the                            patient. Return to normal activities tomorrow.                            Written discharge instructions were provided to the  patient.                           - Resume previous diet.                           - Continue present medications.                           - Await pathology results. If duodenal polyp                            adenomatous, will need repeat EGD with polypectomy.                           - Diflucan (fluconazole) 200 mg PO daily for 2                            weeks.                           - Repeat upper endoscopy in 8 weeks to evaluate the                            response to therapy with Diflucan.                           - Consider pancreatic enzyme therapy for opily                             diarrhea.                           - Return to GI office in 4 weeks with Neil Crouch. Procedure Code(s):        --- Professional ---                           [phone], Esophagogastroduodenoscopy, flexible,                            transoral; with biopsy, single or multiple Diagnosis Code(s):        --- Professional ---                           B37.81, Candidal esophagitis                           K29.70, Gastritis, unspecified, without bleeding                           K31.7, Polyp of stomach and duodenum                           K21.9, Gastro-esophageal reflux disease without                            esophagitis  R63.4, Abnormal weight loss CPT copyright 2019 American Medical Association. All rights reserved. The codes documented in this report are preliminary and upon coder review may  be revised to meet current compliance requirements. Elon Alas. Abbey Chatters, [place] Abbey Chatters, MD 05/13/2020 8:31:14 AM This report has been signed electronically. Number of Addenda: 0

## 2020-05-13 NOTE — Anesthesia Preprocedure Evaluation (Signed)
Anesthesia Evaluation  Patient identified by MRN, date of birth, ID band Patient awake    Reviewed: Allergy & Precautions, NPO status , Patient's Chart, lab work & pertinent test results, reviewed documented beta blocker date and time   Airway Mallampati: II  TM Distance: >3 FB Neck ROM: Limited    Dental no notable dental hx. (+) Teeth Intact, Dental Advisory Given   Pulmonary Current Smoker,    Pulmonary exam normal breath sounds clear to auscultation       Cardiovascular Exercise Tolerance: Good hypertension,  Rhythm:Regular Rate:Normal     Neuro/Psych    GI/Hepatic   Endo/Other  diabetes, Well Controlled, Type 2, Oral Hypoglycemic Agents  Renal/GU negative Renal ROS     Musculoskeletal   Abdominal Normal abdominal exam  (+)   Peds  Hematology   Anesthesia Other Findings   Reproductive/Obstetrics                             Anesthesia Physical Anesthesia Plan  ASA: II  Anesthesia Plan: General   Post-op Pain Management:    Induction: Intravenous  PONV Risk Score and Plan: 0 and Propofol infusion and TIVA  Airway Management Planned: Nasal Cannula and Natural Airway  Additional Equipment:   Intra-op Plan:   Post-operative Plan:   Informed Consent: I have reviewed the patients History and Physical, chart, labs and discussed the procedure including the risks, benefits and alternatives for the proposed anesthesia with the patient or authorized representative who has indicated his/her understanding and acceptance.     Dental advisory given  Plan Discussed with: CRNA, Anesthesiologist and Surgeon  Anesthesia Plan Comments:         Anesthesia Quick Evaluation

## 2020-05-13 NOTE — Telephone Encounter (Signed)
noted 

## 2020-05-14 LAB — SURGICAL PATHOLOGY

## 2020-05-16 ENCOUNTER — Encounter (HOSPITAL_COMMUNITY): Payer: Self-pay | Admitting: Internal Medicine

## 2020-05-21 ENCOUNTER — Encounter: Payer: Self-pay | Admitting: Internal Medicine

## 2020-05-21 ENCOUNTER — Other Ambulatory Visit: Payer: Self-pay

## 2020-05-21 ENCOUNTER — Ambulatory Visit: Payer: Medicare Other | Admitting: Internal Medicine

## 2020-05-21 VITALS — BP 102/66 | HR 106 | Temp 98.7°F | Ht 74.0 in | Wt 153.8 lb

## 2020-05-21 DIAGNOSIS — E119 Type 2 diabetes mellitus without complications: Secondary | ICD-10-CM

## 2020-05-21 DIAGNOSIS — R42 Dizziness and giddiness: Secondary | ICD-10-CM

## 2020-05-21 DIAGNOSIS — E1165 Type 2 diabetes mellitus with hyperglycemia: Secondary | ICD-10-CM | POA: Diagnosis not present

## 2020-05-21 DIAGNOSIS — I10 Essential (primary) hypertension: Secondary | ICD-10-CM | POA: Diagnosis not present

## 2020-05-21 DIAGNOSIS — R634 Abnormal weight loss: Secondary | ICD-10-CM

## 2020-05-21 DIAGNOSIS — Z79899 Other long term (current) drug therapy: Secondary | ICD-10-CM | POA: Diagnosis not present

## 2020-05-21 LAB — POCT GLYCOSYLATED HEMOGLOBIN (HGB A1C): Hemoglobin A1C: 11 % — AB (ref 4.0–5.6)

## 2020-05-21 MED ORDER — BD PEN NEEDLE MINI U/F 31G X 5 MM MISC: 1 refills | Status: DC

## 2020-05-21 MED ORDER — AMLODIPINE BESY-BENAZEPRIL HCL 2.5-10 MG PO CAPS: 1.0000 | ORAL_CAPSULE | Freq: Every day | ORAL | 1 refills | Status: DC

## 2020-05-21 MED ORDER — BASAGLAR KWIKPEN 100 UNIT/ML ~~LOC~~ SOPN: 15.0000 [IU] | PEN_INJECTOR | Freq: Every day | SUBCUTANEOUS | 1 refills | Status: DC

## 2020-05-21 NOTE — Patient Instructions (Addendum)
Will need to decrease  bp medication in 1/2  Because of your weight loss   May not need as much BP medication.   Will send in  New preparation  Still advise stop tobacco   Maximum dose of  Metformin  Advised is 2000 mg per day   Can continue.  It is possible high dose can cause diarrhea  But agree that  malapsortion from pancreas insufficiency could cause   The problem  I advise you conctact the current GI office for advice on  Medication for this.    diabetes is out of control we  May still  endocrinology help.   Insulin once a day basalglar.  Monitoring systems are possible in a  full plan .   For now check BG twice a day and record .  Plan readings send in in 2 -3 weeks   To adjust insulin in the interim .  Please see  The endopcrinologist  For help with management  .  Will do re referral  Stopping tobacco  Still advised to prevent lung disease heart diseaes and cancers .  Dont take the diuretic at this time  Glad you stopped the alcohol since  That an cause  pancreas problem and neuropathy .   Take  b complex vitamins   In case  Not absorbing through the bowel.

## 2020-05-21 NOTE — Progress Notes (Signed)
Chief Complaint  Patient presents with  . Medication Management    has several things to discuss    HPI:  Todd Rodriguez 44 y.o. come in for Chronic disease management  Last had televisit in spring     Had episode of low bg  sinced then  Has lost weight   Went to  Hospital  and was 300 .  increaed to 4 metformin per day 4000  Asking about .   monitoring sytem.   For diabetes.   Last endo appt  Almost a year ago and didn't fu     Had gi evaluation  Has had endo anc candida esophagitis ut   bx ? Unremarkable   Gained since last visit   7 pounds   Tobacco 1ppd still denies sob  Hands and feet  Still off  [place]?   .    ? grease in stool  For about a month   ? Pancreas enzymes  If would help And has 1000 mg  otc  . Digestive enzymes    Limiting covid exposure but will go out    Had eye check and  Vision change from DM  so   ROS: See pertinent positives and negatives per HPI.no cp sob falling using cane  Depression ok   Denies abd pain   Past Medical History:  Diagnosis Date  . Acute pancreatitis 10/25/2013  . Arthritis   . Diabetes mellitus without complication ([place])   . GERD (gastroesophageal reflux disease)   . Hepatic steatosis   . History of pancreatitis   . Hypertension   . Myalgia and myositis 04/21/2014   much better from illness presumed infectious    has disability form  will complete    . Pancreatic pseudocyst     Family History  Problem Relation Age of Onset  . Diabetes Mother   . Hypertension Mother   . Diabetes Father   . Hypertension Father   . Arthritis Maternal Grandmother   . Hypertension Maternal Grandfather   . Diabetes Maternal Grandfather   . Colon cancer Neg Hx   . Pancreatitis Neg Hx   . Pancreatic cancer Neg Hx     Social History   Socioeconomic History  . Marital status: Married    Spouse name: Not on file  . Number of children: 1  . Years of education: Not on file  . Highest education level: Not on file  Occupational  History    Employer: [employer]  Tobacco Use  . Smoking status: Current Every Day Smoker    Packs/day: 1.00    Years: 12.00    Pack years: 12.00    Types: Cigarettes  . Smokeless tobacco: Never Used  Vaping Use  . Vaping Use: Never used  Substance and Sexual Activity  . Alcohol use: Yes    Comment: every weekend, one drink. years ago drank more regularly  . Drug use: No  . Sexual activity: Yes    Partners: Female    Comment: Patient's Wife  Other Topics Concern  . 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 Difficulty of Paying Living Expenses:   Food Insecurity:   . Worried About Charity fundraiser in the Last Year:   . Arboriculturist in the Last Year:   Transportation Needs:   . Film/video editor (Medical):   Marland Kitchen Lack of Transportation (Non-Medical):   Physical Activity:   . Days of Exercise per Week:   . Minutes of Exercise per Session:   Stress:   . Feeling of Stress :   Social Connections:   . Frequency of Communication with Friends and Family:   . Frequency of Social Gatherings with Friends and Family:   . Attends Religious Services:   . Active Member of Clubs or Organizations:   . Attends Archivist Meetings:   Marland Kitchen Marital Status:     Outpatient Medications Prior to Visit  Medication Sig Dispense Refill  . albuterol (VENTOLIN HFA) 108 (90 Base) MCG/ACT inhaler TAKE 2 PUFFS BY MOUTH EVERY 6 HOURS AS NEEDED FOR WHEEZE OR SHORTNESS OF BREATH (Patient taking differently: Inhale 2 puffs into the lungs every 6 (six) hours as needed for wheezing or shortness of breath. ) 1 g 1  . Aspirin-Acetaminophen (GOODYS BODY PAIN PO)  Take 1 Package by mouth daily as needed (headache/pain).    Marland Kitchen atorvastatin (LIPITOR) 40 MG tablet TAKE 1 TABLET BY MOUTH EVERY DAY (Patient taking differently: Take 40 mg by mouth daily. ) 30 tablet 5  . Brimonidine Tartrate (LUMIFY) 0.025 % SOLN Place 1 drop into both eyes daily.    . citalopram (CELEXA) 20 MG tablet TAKE 10 MG BY MOUTH PER DAY FOR 1 WEEK , THEN INCREASE TO 20 MG BY MOUTH PER DAY (Patient taking differently: Take 20 mg by mouth daily as needed (depression). ) 24 tablet 0  . empagliflozin (JARDIANCE) 25 MG TABS tablet Take 25 mg by mouth daily. 30 tablet 6  . fluconazole (DIFLUCAN) 200 MG tablet Take 1 tablet (200 mg total) by mouth daily for 14 days. 14 tablet 0  . furosemide (LASIX) 20 MG tablet Take 1 tablet (20 mg total) by mouth daily. For swelling,take with potassium (Patient taking differently: Take 20 mg by mouth daily as needed for edema. ) 10 tablet 0  . gabapentin (NEURONTIN) 300 MG capsule Take 300 mg by mouth 3 (three) times daily.     Marland Kitchen glucose blood (ONE TOUCH TEST STRIPS) test strip Use as instructed 100 each 11  . LORazepam (ATIVAN) 1 MG tablet Take 1 tablet (1 mg total) by mouth every 8 (eight) hours as needed for anxiety. Panic attacks 20 tablet 0  . metFORMIN (GLUCOPHAGE) 1000 MG tablet Take 1 tablet (1,000 mg total) by mouth 2 (two) times daily with a meal. 180 tablet 3  . ondansetron (ZOFRAN ODT) 4 MG disintegrating tablet Take 1 tablet (4 mg total) by mouth every 8 (eight) hours as needed for nausea or vomiting. 20 tablet 0  . pantoprazole (PROTONIX) 40 MG tablet TAKE 1 TABLET BY MOUTH TWICE A DAY (INS WILL ONLY PAY ONCE A DAY) (Patient taking differently: Take 40 mg by mouth 2 (two) times daily. ) 30 tablet 5  . Potassium 99 MG TABS Take 99 mg by mouth daily.    Marland Kitchen amLODipine-benazepril (LOTREL) 5-20 MG capsule TAKE 1 CAPSULE BY MOUTH EVERY DAY (Patient taking differently: Take 1 capsule by mouth daily. ) 90 capsule 1  . prednisoLONE acetate (PRED FORTE) 1 %  ophthalmic  suspension Place 1 drop into the left eye 2 (two) times daily. (Patient not taking: Reported on 05/21/2020)     No facility-administered medications prior to visit.     EXAM:  BP 102/66   Pulse (!) 106   Temp 98.7 F (37.1 C) (Oral)   Ht 6' 2"  (1.88 m)   Wt 153 lb 12.8 oz (69.8 kg)   SpO2 97%   BMI 19.75 kg/m   Body mass index is 19.75 kg/m.  GENERAL: vitals reviewed and listed above, alert, oriented, appears well hydrated and in no acute distress HEENT: atraumatic, conjunctiva  clear, no obvious abnormalities on inspection of external nose and ears OPmasked NECK: no obvious masses on inspection palpation  LUNGS: clear to auscultation bilaterally, no wheezes, rales or rhonchi, good air movement CV: HRRR, no clubbing cyanosis or  peripheral edema nl cap refill  Abdomen:  Sof,t normal bowel sounds without hepatosplenomegaly, no guarding rebound or masses no CVA tenderness MS: moves all extremities without noticeable focal  Abnormality upper extremity dec muscle mass   Ext trc edema  PSYCH:cooperative, no obvious depression or anxiety Lab Results  Component Value Date   WBC 3.1 (L) 11/25/2019   HGB 14.1 11/25/2019   HCT 42.8 11/25/2019   PLT 205 11/25/2019   GLUCOSE 448 (H) 05/10/2020   CHOL 111 10/18/2019   TRIG 75.0 10/18/2019   HDL 52.60 10/18/2019   LDLDIRECT 193.0 03/21/2019   LDLCALC 44 10/18/2019   ALT 32 10/18/2019   AST 23 10/18/2019   NA 132 (L) 05/10/2020   K 4.6 05/10/2020   CL 98 05/10/2020   CREATININE 0.59 (L) 05/10/2020   BUN <5 (L) 05/10/2020   CO2 22 05/10/2020   TSH 0.95 10/18/2019   PSA 0.76 01/07/2016   HGBA1C 11.0 (A) 05/21/2020   MICROALBUR <0.7 10/18/2019   BP Readings from Last 3 Encounters:  05/21/20 102/66  05/13/20 114/82  04/26/20 (!) 92/62   Wt Readings from Last 3 Encounters:  05/21/20 153 lb 12.8 oz (69.8 kg)  04/26/20 146 lb 12.8 oz (66.6 kg)  11/25/19 160 lb (72.6 kg)    ASSESSMENT AND PLAN:  Discussed the  following assessment and plan:  Uncontrolled type 2 diabetes mellitus with hyperglycemia ([place]) - Plan: Ambulatory referral to Endocrinology  Essential hypertension, benign - low readings with weight loss  dec dosing get  dibets undercontrol   Medication management  Weight loss - stabilized but dm way out of control now  - Plan: Ambulatory referral to Endocrinology  Type 2 diabetes mellitus without complication, without long-term current use of insulin ([place]) - Plan: POCT glycosylated hemoglobin (Hb A1C)  Orthostatic dizziness At this point need to get blood sugar in better control and get back in care   Dose of metformin no more than 2000 diarrhea could be from inc self dose or  Pancreatic insufficieny  bp  Low  ? Hydration   Will adjust medication  Down at this time  Begin low dose basaglar 15 units per day   sample and rx given. Check bg bid and send in readings in 2 weeks or thereabouts.  Plan rov 2 mos  Plan re referral to endocrinology patient agrees  GI can discuss the other findings if malabsorption vs  Med effect    -Patient advised to return or notify health care team  if  new concerns arise. Prolonged visit time review and instruction  50 minutes  Patient Instructions  Will need to decrease  bp medication in 1/2  Because of your weight loss   May not need as much BP medication.   Will send in  New preparation  Still advise stop tobacco   Maximum dose of  Metformin  Advised is 2000 mg per day   Can continue.  It is possible high dose can cause diarrhea  But agree that  malapsortion from pancreas insufficiency could cause   The problem  I advise you conctact the current GI office for advice on  Medication for this.    diabetes is out of control we  May still  endocrinology help.   Insulin once a day basalglar.  Monitoring systems are possible in a  full plan .   For now check BG twice a day and record .  Plan readings send in in 2 -3 weeks   To adjust insulin in the  interim .  Please see  The endopcrinologist  For help with management  .  Will do re referral  Stopping tobacco  Still advised to prevent lung disease heart diseaes and cancers .  Dont take the diuretic at this time  Glad you stopped the alcohol since  That an cause  pancreas problem and neuropathy .   Take  b complex vitamins   In case  Not absorbing through the bowel.      Standley Brooking. Neiko Trivedi M.D.

## 2020-05-28 ENCOUNTER — Telehealth: Payer: Self-pay | Admitting: Internal Medicine

## 2020-05-28 NOTE — Telephone Encounter (Signed)
Called patient and LMOVM to return call  Left a voice message for patient to let him know that we have already sent in his insulin to his pharmacy on 05/21/2020 and his pen needles and to contact the pharmacy and pick them up.

## 2020-05-28 NOTE — Telephone Encounter (Signed)
Pt is requesting a refill:   Medication:  basaglar insulin   Pharm[facility]  [id number] [place] [place]:  [phone] Fax:  [phone]

## 2020-06-05 ENCOUNTER — Other Ambulatory Visit: Payer: Self-pay | Admitting: Internal Medicine

## 2020-06-07 ENCOUNTER — Ambulatory Visit: Payer: Medicare Other | Admitting: Internal Medicine

## 2020-06-07 NOTE — Progress Notes (Deleted)
Name: Todd Rodriguez  Age/ Sex: 44 y.o., male   MRN/ DOB: [id number], 1976/04/28     PCP: Burnis Medin, MD   Reason for Endocrinology Evaluation: Type 2 Diabetes Mellitus  Initial Endocrine Consultative Visit: 05/10/2019    PATIENT IDENTIFIER: Mr. Lenzie Sandler is a 44 y.o. male with a past medical history of T2DM, Hx of pancreatitis, hepatic steatosis and ETOH abuse . The patient has followed with Endocrinology clinic since 05/10/2019 for consultative assistance with management of his diabetes.  DIABETIC HISTORY:  Mr. Strege was diagnosed with T2DM years ago, has been on oral glycemic agents until *** when insulin was started. His hemoglobin A1c has ranged from 7.1% in 2015, peaking at 11.0% in 2021.   On his initial visit his A1c was 9.5% , he was on Jardiance, Glimepiride and Metformin . We switched from Glimepiride to Glipizide but continued with Jardiance and Metformin. Pt was lost to follow up and did not present for another endocrinology appointment until 05/2020 by then he had an A1c of 11.0%    SUBJECTIVE:   During the last visit (05/10/2019): A1c 9.5 %. Switched  Glimepiride to Glipizide and continued Metformin and Jardiance.   Today (06/07/2020): Mr. Hank is here for a follow up on diabetes.   He checks his blood sugars *** times daily, preprandial to breakfast and ***. The patient has *** had hypoglycemic episodes since the last clinic visit, which typically occur *** x / - most often occuring ***. The patient is *** symptomatic with these episodes, with symptoms of {symptoms; hypoglycemia:9084048}.      HOME DIABETES REGIMEN:  Jardiance 25 mg daily  Basaglar  Metformin 1000 mg BID   Statin: yes ACE-I/ARB: Yes    METER DOWNLOAD SUMMARY: Date range evaluated: *** Fingerstick Blood Glucose Tests = *** Average Number Tests/Day = *** Overall Mean FS Glucose = *** Standard Deviation = ***  BG Ranges: Low = *** High = ***   Hypoglycemic Events/30 Days: BG < 50 =  *** Episodes of symptomatic severe hypoglycemia = ***    DIABETIC COMPLICATIONS: Microvascular complications:    Denies: CKD, retinopathy and neuropathy   Last Eye Exam: Completed   Macrovascular complications:    Denies: CAD, CVA, PVD   HISTORY:  Past Medical History:  Past Medical History:  Diagnosis Date  . Acute pancreatitis 10/25/2013  . Arthritis   . Diabetes mellitus without complication ([place])   . GERD (gastroesophageal reflux disease)   . Hepatic steatosis   . History of pancreatitis   . Hypertension   . Myalgia and myositis 04/21/2014   much better from illness presumed infectious    has disability form  will complete    . Pancreatic pseudocyst     Past Surgical History:  Past Surgical History:  Procedure Laterality Date  . ANTERIOR CERVICAL DECOMP/DISCECTOMY FUSION N/A 01/12/2018   Procedure: Revision ACDF C4-5, removal of hardware C5-6, exploration of fusion C5-6;  Surgeon: [name], MD;  Location: [facility];  Service: Orthopedics;  Laterality: N/A;  3.5 hrs  . BIOPSY  05/13/2020   Procedure: BIOPSY;  Surgeon: Eloise Harman, DO;  [facility];  Service: Endoscopy;;  . COLONOSCOPY WITH PROPOFOL N/A 05/13/2020   Procedure: COLONOSCOPY WITH PROPOFOL;  Surgeon: Eloise Harman, DO;  [facility];  Service: Endoscopy;  Laterality: N/A;  1:30pm  . ESOPHAGEAL BRUSHING  05/13/2020   Procedure: ESOPHAGEAL BRUSHING;  Surgeon: Eloise Harman, DO;  [facility];  Service:  Endoscopy;;  mid esophagus  . ESOPHAGOGASTRODUODENOSCOPY (EGD) WITH PROPOFOL N/A 05/13/2020   Procedure: ESOPHAGOGASTRODUODENOSCOPY (EGD) WITH PROPOFOL;  Surgeon: Eloise Harman, DO;  [facility];  Service: Endoscopy;  Laterality: N/A;  . NECK SURGERY     2 disc  . POLYPECTOMY  05/13/2020   Procedure: POLYPECTOMY;  Surgeon: Eloise Harman, DO;  [facility];  Service: Endoscopy;;  . SPINAL FUSION       Social History:  reports that he  has been smoking cigarettes. He has a 12.00 pack-year smoking history. He has never used smokeless tobacco. He reports current alcohol use. He reports that he does not use drugs. Family History:  Family History  Problem Relation Age of Onset  . Diabetes Mother   . Hypertension Mother   . Diabetes Father   . Hypertension Father   . Arthritis Maternal Grandmother   . Hypertension Maternal Grandfather   . Diabetes Maternal Grandfather   . Colon cancer Neg Hx   . Pancreatitis Neg Hx   . Pancreatic cancer Neg Hx       HOME MEDICATIONS: Allergies as of 06/07/2020   No Known Allergies     Medication List       Accurate as of June 07, 2020  7:19 AM. If you have any questions, ask your nurse or doctor.        albuterol 108 (90 Base) MCG/ACT inhaler Commonly known as: VENTOLIN HFA TAKE 2 PUFFS BY MOUTH EVERY 6 HOURS AS NEEDED FOR WHEEZE OR SHORTNESS OF BREATH What changed: See the new instructions.   amlodipine-benazepril 2.5-10 MG capsule Commonly known as: LOTREL Take 1 capsule by mouth daily. For high blood pressure .Dose adjustment   atorvastatin 40 MG tablet Commonly known as: LIPITOR TAKE 1 TABLET BY MOUTH EVERY DAY   B-D UF III MINI PEN NEEDLES 31G X 5 MM Misc Generic drug: Insulin Pen Needle Use to inject basaglar daily   Basaglar KwikPen 100 UNIT/ML Inject 0.15 mLs (15 Units total) into the skin daily. Increase as directed   citalopram 20 MG tablet Commonly known as: CELEXA TAKE 10 MG BY MOUTH PER DAY FOR 1 WEEK , THEN INCREASE TO 20 MG BY MOUTH PER DAY What changed: See the new instructions.   empagliflozin 25 MG Tabs tablet Commonly known as: JARDIANCE Take 25 mg by mouth daily.   gabapentin 300 MG capsule Commonly known as: NEURONTIN Take 300 mg by mouth 3 (three) times daily.   glucose blood test strip Commonly known as: ONE TOUCH TEST STRIPS Use as instructed   GOODYS BODY PAIN PO Take 1 Package by mouth daily as needed (headache/pain).     LORazepam 1 MG tablet Commonly known as: Ativan Take 1 tablet (1 mg total) by mouth every 8 (eight) hours as needed for anxiety. Panic attacks   Lumify 0.025 % Soln Generic drug: Brimonidine Tartrate Place 1 drop into both eyes daily.   metFORMIN 1000 MG tablet Commonly known as: GLUCOPHAGE Take 1 tablet (1,000 mg total) by mouth 2 (two) times daily with a meal.   ondansetron 4 MG disintegrating tablet Commonly known as: Zofran ODT Take 1 tablet (4 mg total) by mouth every 8 (eight) hours as needed for nausea or vomiting.   Potassium 99 MG Tabs Take 99 mg by mouth daily.        OBJECTIVE:   Vital Signs: There were no vitals taken for this visit.  Wt Readings from Last 3 Encounters:  05/21/20 153  lb 12.8 oz (69.8 kg)  04/26/20 146 lb 12.8 oz (66.6 kg)  11/25/19 160 lb (72.6 kg)     Exam: General: Pt appears well and is in NAD  Hydration: Well-hydrated with moist mucous membranes and good skin turgor  HEENT: Head: Unremarkable with good dentition. Oropharynx clear without exudate.  Eyes: External eye exam normal without stare, lid lag or exophthalmos.  EOM intact.  PERRL.  Neck: General: Supple without adenopathy. Thyroid: Thyroid size normal.  No goiter or nodules appreciated. No thyroid bruit.  Lungs: Clear with good BS bilat with no rales, rhonchi, or wheezes  Heart: RRR with normal S1 and S2 and no gallops; no murmurs; no rub  Abdomen: Normoactive bowel sounds, soft, nontender, without masses or organomegaly palpable  Extremities: No pretibial edema. No tremor. Normal strength and motion throughout. See detailed diabetic foot exam below.  Skin: Normal texture and temperature to palpation. No rash noted. No Acanthosis nigricans/skin tags. No lipohypertrophy.  Neuro: MS is good with appropriate affect, pt is alert and Ox3    DM foot exam: Please see diabetic assessment flow-sheet detailed below:           DATA REVIEWED:  Lab Results  Component Value Date    HGBA1C 11.0 (A) 05/21/2020   HGBA1C 7.9 (H) 10/18/2019   HGBA1C 8.5 (H) 06/19/2019   Lab Results  Component Value Date   MICROALBUR <0.7 10/18/2019   LDLCALC 44 10/18/2019   CREATININE 0.59 (L) 05/10/2020   Lab Results  Component Value Date   MICRALBCREAT 1.7 10/18/2019     Lab Results  Component Value Date   CHOL 111 10/18/2019   HDL 52.60 10/18/2019   LDLCALC 44 10/18/2019   LDLDIRECT 193.0 03/21/2019   TRIG 75.0 10/18/2019   CHOLHDL 2 10/18/2019         ASSESSMENT / PLAN / RECOMMENDATIONS:   1) Type {NUMBERS 1 OR 2:522190} Diabetes Mellitus, ***controlled, With *** complications - Most recent A1c of *** %. Goal A1c < *** %.  ***  Plan: MEDICATIONS:  ***  EDUCATION / INSTRUCTIONS:  BG monitoring instructions: Patient is instructed to check his blood sugars *** times a day, ***. . Call [facility] if: BG persistently < 70  . I reviewed the Rule of 15 for the treatment of hypoglycemia in detail with the patient. Literature supplied.     2) Diabetic complications:   Eye: Does not have known diabetic retinopathy.   Neuro/ Feet: Does not have known diabetic peripheral neuropathy .   Renal: Patient does not have known baseline CKD. He   is  on an ACEI/ARB at present.   3) Dyslipidemia: Patient is *** on a statin.     F/U in ***    Signed electronically by: Mack Guise, MD  [facility] Endocrinology  [facility] [address] [place], [place] [address] Phone: [phone] FAX: [phone]   CC: Burnis Medin, [place] [address] Phone: [phone]  Fax: [phone]  Return to Endocrinology clinic as below: Future Appointments  Date Time Provider [place]  06/07/2020  8:50 AM Carlinda Ohlson, Melanie Crazier, MD [facility] None  07/10/2020  9:30 AM Mahala Menghini, PA-C [facility]  07/22/2020  3:30 PM Panosh, Standley Brooking, MD [facility]

## 2020-07-02 ENCOUNTER — Other Ambulatory Visit: Payer: Self-pay | Admitting: Internal Medicine

## 2020-07-10 ENCOUNTER — Ambulatory Visit: Payer: Medicare Other | Admitting: Gastroenterology

## 2020-07-22 ENCOUNTER — Ambulatory Visit: Payer: Medicare Other | Admitting: Internal Medicine

## 2020-07-22 NOTE — Progress Notes (Deleted)
No chief complaint on file.   HPI: Todd Rodriguez 44 y.o. come in for Chronic disease management    No show  Sept  3 appt with enco not sure if hw was notrifiec  ROS: See pertinent positives and negatives per HPI.  Past Medical History:  Diagnosis Date  . Acute pancreatitis 10/25/2013  . Arthritis   . Diabetes mellitus without complication ([place])   . GERD (gastroesophageal reflux disease)   . Hepatic steatosis   . History of pancreatitis   . Hypertension   . Myalgia and myositis 04/21/2014   much better from illness presumed infectious    has disability form  will complete    . Pancreatic pseudocyst     Family History  Problem Relation Age of Onset  . Diabetes Mother   . Hypertension Mother   . Diabetes Father   . Hypertension Father   . Arthritis Maternal Grandmother   . Hypertension Maternal Grandfather   . Diabetes Maternal Grandfather   . Colon cancer Neg Hx   . Pancreatitis Neg Hx   . Pancreatic cancer Neg Hx     Social History   Socioeconomic History  . Marital status: Married    Spouse name: Not on file  . Number of children: 1  . Years of education: Not on file  . Highest education level: Not on file  Occupational History    Employer: [employer]  Tobacco Use  . Smoking status: Current Every Day Smoker    Packs/day: 1.00    Years: 12.00    Pack years: 12.00    Types: Cigarettes  . Smokeless tobacco: Never Used  Vaping Use  . Vaping Use: Never used  Substance and Sexual Activity  . Alcohol use: Yes    Comment: every weekend, one drink. years ago drank more regularly  . Drug use: No  . Sexual activity: Yes    Partners: Female    Comment: Patient's Wife  Other Topics Concern  . 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 Difficulty of Paying Living Expenses: Not on file  Food Insecurity:   . Worried About Charity fundraiser in the Last Year: Not on file  . Ran Out of Food in the Last Year: Not on file  Transportation Needs:   . Lack of Transportation (Medical): Not on file  . Lack of Transportation (Non-Medical): Not on file  Physical Activity:   . Days of Exercise per Week: Not on file  . Minutes of Exercise per Session: Not on file  Stress:   . Feeling of Stress : Not on file  Social Connections:   . Frequency of Communication with Friends and Family: Not on file  . Frequency of Social Gatherings with Friends and Family: Not on file  . Attends Religious Services: Not on file  . Active Member of Clubs or Organizations: Not on file  . Attends Archivist Meetings: Not on file  . Marital Status: Not on file  Outpatient Medications Prior to Visit  Medication Sig Dispense Refill  . albuterol (VENTOLIN HFA) 108 (90 Base) MCG/ACT inhaler TAKE 2 PUFFS BY MOUTH EVERY 6 HOURS AS NEEDED FOR WHEEZE OR SHORTNESS OF BREATH (Patient taking differently: Inhale 2 puffs into the lungs every 6 (six) hours as needed for wheezing or shortness of breath. ) 1 g 1  . amlodipine-benazepril (LOTREL) 2.5-10 MG capsule Take 1 capsule by mouth daily. For high blood pressure .Dose adjustment 90 capsule 1  . Aspirin-Acetaminophen (GOODYS BODY PAIN PO) Take 1 Package by mouth daily as needed (headache/pain).    Marland Kitchen atorvastatin (LIPITOR) 40 MG tablet TAKE 1 TABLET BY MOUTH EVERY DAY 90 tablet 1  . Brimonidine Tartrate (LUMIFY) 0.025 % SOLN Place 1 drop into both eyes daily.    . citalopram (CELEXA) 20 MG tablet TAKE 10 MG BY MOUTH PER DAY FOR 1 WEEK , THEN INCREASE TO 20 MG BY MOUTH PER DAY (Patient taking differently: Take 20 mg by mouth daily as needed (depression). ) 24 tablet 0  . empagliflozin (JARDIANCE)  25 MG TABS tablet Take 25 mg by mouth daily. 30 tablet 6  . gabapentin (NEURONTIN) 300 MG capsule Take 300 mg by mouth 3 (three) times daily.     Marland Kitchen glucose blood (ONE TOUCH TEST STRIPS) test strip Use as instructed 100 each 11  . Insulin Glargine (BASAGLAR KWIKPEN) 100 UNIT/ML Inject 0.15 mLs (15 Units total) into the skin daily. Increase as directed 15 mL 1  . Insulin Pen Needle (B-D UF III MINI PEN NEEDLES) 31G X 5 MM MISC Use to inject basaglar daily 100 each 1  . LORazepam (ATIVAN) 1 MG tablet Take 1 tablet (1 mg total) by mouth every 8 (eight) hours as needed for anxiety. Panic attacks 20 tablet 0  . metFORMIN (GLUCOPHAGE) 1000 MG tablet Take 1 tablet (1,000 mg total) by mouth 2 (two) times daily with a meal. 180 tablet 3  . ondansetron (ZOFRAN ODT) 4 MG disintegrating tablet Take 1 tablet (4 mg total) by mouth every 8 (eight) hours as needed for nausea or vomiting. 20 tablet 0  . Potassium 99 MG TABS Take 99 mg by mouth daily.     No facility-administered medications prior to visit.     EXAM:  There were no vitals taken for this visit.  There is no height or weight on file to calculate BMI.  GENERAL: vitals reviewed and listed above, alert, oriented, appears well hydrated and in no acute distress HEENT: atraumatic, conjunctiva  clear, no obvious abnormalities on inspection of external nose and ears OP : no lesion edema or exudate  NECK: no obvious masses on inspection palpation  LUNGS: clear to auscultation bilaterally, no wheezes, rales or rhonchi, good air movement CV: HRRR, no clubbing cyanosis or  peripheral edema nl cap refill  MS: moves all extremities without noticeable focal  abnormality PSYCH: pleasant and cooperative, no obvious depression or anxiety Lab Results  Component Value Date   WBC 3.1 (L) 11/25/2019   HGB 14.1 11/25/2019   HCT 42.8 11/25/2019   PLT 205 11/25/2019   GLUCOSE 448 (H) 05/10/2020   CHOL 111 10/18/2019   TRIG 75.0 10/18/2019   HDL 52.60  10/18/2019   LDLDIRECT 193.0 03/21/2019   LDLCALC 44 10/18/2019   ALT 32 10/18/2019   AST 23 10/18/2019   NA 132 (L) 05/10/2020   K 4.6 05/10/2020   CL 98 05/10/2020   CREATININE 0.59 (L) 05/10/2020   BUN <5 (L) 05/10/2020  CO2 22 05/10/2020   TSH 0.95 10/18/2019   PSA 0.76 01/07/2016   HGBA1C 11.0 (A) 05/21/2020   MICROALBUR <0.7 10/18/2019   BP Readings from Last 3 Encounters:  05/21/20 102/66  05/13/20 114/82  04/26/20 (!) 92/62    ASSESSMENT AND PLAN:  Discussed the following assessment and plan:  No diagnosis found. Failure to  Fu and see endo   Not sure why but this  Is an ongoing issue for dm control   I cannot help  syou with the diabetes  Please   follo wthrough and get help.  -Patient advised to return or notify health care team  if  new concerns arise.  There are no Patient Instructions on file for this visit.   Standley Brooking. Aniken Monestime M.D.

## 2020-08-20 ENCOUNTER — Other Ambulatory Visit: Payer: Self-pay | Admitting: Internal Medicine

## 2020-08-27 ENCOUNTER — Ambulatory Visit: Payer: Medicare Other | Admitting: Gastroenterology

## 2020-08-27 ENCOUNTER — Other Ambulatory Visit: Payer: Self-pay

## 2020-08-27 ENCOUNTER — Encounter: Payer: Self-pay | Admitting: Gastroenterology

## 2020-08-27 VITALS — BP 155/96 | HR 93 | Temp 97.1°F | Ht 74.0 in | Wt 150.6 lb

## 2020-08-27 DIAGNOSIS — R634 Abnormal weight loss: Secondary | ICD-10-CM | POA: Diagnosis not present

## 2020-08-27 DIAGNOSIS — R195 Other fecal abnormalities: Secondary | ICD-10-CM

## 2020-08-27 DIAGNOSIS — K219 Gastro-esophageal reflux disease without esophagitis: Secondary | ICD-10-CM

## 2020-08-27 MED ORDER — PANCRELIPASE (LIP-PROT-AMYL) 36000-114000 UNITS PO CPEP: ORAL_CAPSULE | ORAL | 3 refills | Status: DC

## 2020-08-27 NOTE — Progress Notes (Addendum)
Primary Care Physician:  Burnis Medin, MD  Primary Gastroenterologist:  Elon Alas. Abbey Chatters, DO   Chief Complaint  Patient presents with  . Gastroesophageal Reflux    doing okay  . change in bowels    reports BM several times a day after eating, formed stool, not diarrhea. No nausea, no vomiting,     HPI:  Todd Rodriguez is a 44 y.o. male here for follow-up.  Patient was seen back in July for rectal bleeding, chronic GERD, weight loss.  3 years ago he weighed 215 pounds.  Little over 1 year ago he weighed 180 pounds.  When he was seen back in July he weighed 146 pounds.  150 pounds today.  When we saw him previously he contributed to his weight loss distress.  He lost multiple family members, also became disabled due to neck issues.  Required several surgeries.  He had a EGD and colonoscopy in August 2021.  Noted to have moderately severe esophagitis with no bleeding found in the middle third of the esophagus, suspicious for Candida esophagitis.  KOH was negative.  He was treated with 2 weeks of Diflucan.  GE junction biopsy showed GERD with mild inflammation consistent with reflux.  Patient had gastritis.  Duodenal biopsies benign.  Duodenal polyp biopsy, Brunner's gland hyperplasia and mild changes suggestive of peptic injury.  No findings consistent with celiac.  On colonoscopy she had 2 cecal polyps removed, inflammatory.  Sigmoid colon polyp was hyperplastic.  Granular mucosa in the transverse colon biopsied, benign with no evidence of microscopic colitis.    Patient presents back today stating that his reflux is well controlled.  Denies heartburn or dysphagia.  No nausea or vomiting or abdominal pain.  BM 4-6 per day.  He has a bowel movement every single time he eats. Nocturnal stool. Solid stools.  No melena or rectal bleeding.  He has seen Oil in stool in the past.  His appetite comes and goes.  MRI abdomen/MRCP August 2020 for follow-up of pancreatic cyst/pseudocyst.  He had severe  hepatic steatosis.  Mild acute/resolving pancreatitis.  5.2 x 6.1 cm thick-walled fluid collection along the posterior aspect of the pancreatic tail, likely reflecting a pseudocyst.  Abdominal ultrasound August 2020: Gallbladder unremarkable.  No gallstones.  Common bile duct 6.2 mm.  Hepatic steatosis.  Cystic mass in the region of the pancreatic tail or splenic hilum measuring 6.3 cm.  Liver lesion suspected on CT not seen on ultrasound.  CTA chest/abdomen/pelvis for chest pain August 2020 showed mild stranding in the pancreas suspected pancreatitis, 6.3 cm cystic structure at the pancreatic tail/splenic hilum possible pseudocyst, new low-attenuation lesion within the central aspect of the liver which may represent cyst/fluid, more focal fatty deposition or potentially a true lesion.  Extensive hepatic steatosis.   Current Outpatient Medications  Medication Sig Dispense Refill  . albuterol (VENTOLIN HFA) 108 (90 Base) MCG/ACT inhaler TAKE 2 PUFFS BY MOUTH EVERY 6 HOURS AS NEEDED FOR WHEEZE OR SHORTNESS OF BREATH (Patient taking differently: Inhale 2 puffs into the lungs every 6 (six) hours as needed for wheezing or shortness of breath. ) 1 g 1  . amlodipine-benazepril (LOTREL) 2.5-10 MG capsule Take 1 capsule by mouth daily. For high blood pressure .Dose adjustment 90 capsule 1  . Aspirin-Acetaminophen (GOODYS BODY PAIN PO) Take 1 Package by mouth daily as needed (headache/pain).    Marland Kitchen atorvastatin (LIPITOR) 40 MG tablet TAKE 1 TABLET BY MOUTH EVERY DAY 90 tablet 1  . Brimonidine Tartrate (LUMIFY) 0.025 %  SOLN Place 1 drop into both eyes daily.    . citalopram (CELEXA) 20 MG tablet TAKE 10 MG BY MOUTH PER DAY FOR 1 WEEK , THEN INCREASE TO 20 MG BY MOUTH PER DAY (Patient taking differently: Take 20 mg by mouth daily as needed (depression). ) 24 tablet 0  . empagliflozin (JARDIANCE) 25 MG TABS tablet Take 25 mg by mouth daily. 30 tablet 6  . gabapentin (NEURONTIN) 300 MG capsule Take 300 mg by  mouth 3 (three) times daily.     Marland Kitchen glucose blood (ONE TOUCH TEST STRIPS) test strip Use as instructed 100 each 11  . Insulin Glargine (BASAGLAR KWIKPEN) 100 UNIT/ML Inject 0.15 mLs (15 Units total) into the skin daily. Increase as directed 15 mL 1  . Insulin Pen Needle (B-D UF III MINI PEN NEEDLES) 31G X 5 MM MISC Use to inject basaglar daily 100 each 1  . LORazepam (ATIVAN) 1 MG tablet Take 1 tablet (1 mg total) by mouth every 8 (eight) hours as needed for anxiety. Panic attacks 20 tablet 0  . metFORMIN (GLUCOPHAGE) 1000 MG tablet Take 1 tablet (1,000 mg total) by mouth 2 (two) times daily with a meal. 180 tablet 3  . ondansetron (ZOFRAN ODT) 4 MG disintegrating tablet Take 1 tablet (4 mg total) by mouth every 8 (eight) hours as needed for nausea or vomiting. 20 tablet 0  . Potassium 99 MG TABS Take 99 mg by mouth daily.    . pantoprazole (PROTONIX) 40 MG tablet Take 40 mg by mouth daily.     No current facility-administered medications for this visit.    Allergies as of 08/27/2020  . (No Known Allergies)    Past Medical History:  Diagnosis Date  . Acute pancreatitis 10/25/2013  . Arthritis   . Diabetes mellitus without complication ([place])   . GERD (gastroesophageal reflux disease)   . Hepatic steatosis   . History of pancreatitis   . Hypertension   . Myalgia and myositis 04/21/2014   much better from illness presumed infectious    has disability form  will complete    . Pancreatic pseudocyst     Past Surgical History:  Procedure Laterality Date  . ANTERIOR CERVICAL DECOMP/DISCECTOMY FUSION N/A 01/12/2018   Procedure: Revision ACDF C4-5, removal of hardware C5-6, exploration of fusion C5-6;  Surgeon: [name], MD;  Location: [facility];  Service: Orthopedics;  Laterality: N/A;  3.5 hrs  . BIOPSY  05/13/2020   Procedure: BIOPSY;  Surgeon: Eloise Harman, DO;  [facility];  Service: Endoscopy;;  . COLONOSCOPY WITH PROPOFOL N/A 05/13/2020   Dr. Abbey Chatters: Nonbleeding internal  hemorrhoids.  Few small mouth diverticula in the sigmoid colon.  2 cecal polyps, inflammatory.  Sigmoid colon polyp hyperplastic.  Localized area of granular mucosa in the transverse colon, status post biopsy which was unremarkable.  No microscopic colitis.  Plans for repeat colonoscopy in 5 years.  . ESOPHAGEAL BRUSHING  05/13/2020   Procedure: ESOPHAGEAL BRUSHING;  Surgeon: Eloise Harman, DO;  [facility];  Service: Endoscopy;;  mid esophagus  . ESOPHAGOGASTRODUODENOSCOPY (EGD) WITH PROPOFOL N/A 05/13/2020   Dr. Abbey Chatters: Moderately severe esophagitis with no bleeding found in the middle third of the esophagus, suspicious for Candida esophagitis.  KOH was negative. Gastritis, duodenal polyp.  Duodenal biopsy benign.  Duodenal bulb biopsy with Brunner's gland hyperplasia and mild changes suggestive of peptic injury.  No celiac disease seen.  GE junction biopsy showed GERD with mild inflammation consisten  .  NECK SURGERY     2 disc  . POLYPECTOMY  05/13/2020   Procedure: POLYPECTOMY;  Surgeon: Eloise Harman, DO;  [facility];  Service: Endoscopy;;  . SPINAL FUSION      Family History  Problem Relation Age of Onset  . Diabetes Mother   . Hypertension Mother   . Diabetes Father   . Hypertension Father   . Arthritis Maternal Grandmother   . Hypertension Maternal Grandfather   . Diabetes Maternal Grandfather   . Colon cancer Neg Hx   . Pancreatitis Neg Hx   . Pancreatic cancer Neg Hx     Social History   Socioeconomic History  . Marital status: Married    Spouse name: Not on file  . Number of children: 1  . Years of education: Not on file  . Highest education level: Not on file  Occupational History    Employer: [employer]  Tobacco Use  . Smoking status: Current Every Day Smoker    Packs/day: 1.00    Years: 12.00    Pack years: 12.00    Types: Cigarettes  . Smokeless tobacco: Never Used  Vaping Use  . Vaping Use: Never used  Substance and Sexual  Activity  . Alcohol use: Yes    Comment: every weekend, one drink. years ago drank more regularly  . Drug use: No  . Sexual activity: Yes    Partners: Female    Comment: Patient's Wife  Other Topics Concern  . 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 Difficulty of Paying Living Expenses: Not on file  Food Insecurity:   . Worried About Charity fundraiser in the Last Year: Not on file  . Ran Out of Food in the Last Year: Not on file  Transportation Needs:   . Lack of Transportation (Medical): Not on file  . Lack of Transportation (Non-Medical): Not on file  Physical Activity:   . Days of Exercise per Week: Not on file  . Minutes of Exercise per Session: Not on file  Stress:   . Feeling of Stress : Not on file  Social Connections:   . Frequency of Communication with Friends and Family: Not on file  . Frequency of Social Gatherings with Friends and Family: Not on file  . Attends Religious Services: Not on file  . Active Member of Clubs or Organizations: Not on file  . Attends Archivist Meetings: Not on file  . Marital Status: Not on file  Intimate Partner Violence:   . Fear of Current or Ex-Partner: Not on file  . Emotionally Abused: Not on file  . Physically Abused: Not on file  . Sexually Abused: Not on file      ROS:  General: Negative for  fever, chills, fatigue, weakness.  See HPI Eyes: Negative for vision changes.  ENT: Negative for hoarseness, difficulty swallowing , nasal congestion. CV: Negative for chest pain, angina, palpitations, dyspnea on exertion, peripheral edema.  Respiratory: Negative for dyspnea at rest,  dyspnea on  exertion, cough, sputum, wheezing.  GI: See history of present illness. GU:  Negative for dysuria, hematuria, urinary incontinence, urinary frequency, nocturnal urination.  MS: Neck and back pain Derm: Negative for rash or itching.  Neuro: Negative for weakness, abnormal sensation, seizure, frequent headaches, memory loss, confusion.  Psych: Negative for anxiety, depression, suicidal ideation, hallucinations.  Endo: See HPI.  Heme: Negative for bruising or bleeding. Allergy: Negative for rash or hives.    Physical Examination:  BP (!) 155/96   Pulse 93   Temp (!) 97.1 F (36.2 C)   Ht 6' 2"  (1.88 m)   Wt 150 lb 9.6 oz (68.3 kg)   BMI 19.34 kg/m    General: Well-nourished, well-developed in no acute distress.  Head: Normocephalic, atraumatic.   Eyes: Conjunctiva pink, no icterus. Mouth: masked Neck: Supple without thyromegaly, masses, or lymphadenopathy.  Lungs: Clear to auscultation bilaterally.  Heart: Regular rate and rhythm, no murmurs rubs or gallops.  Abdomen: Bowel sounds are normal, nontender, nondistended, no hepatosplenomegaly or masses, no abdominal bruits or    hernia , no rebound or guarding.   Rectal: not performed Extremities: No lower extremity edema. No  deformities. +clubbing of nails upper extremities Neuro: Alert and oriented x 4 , grossly normal neurologically.  Skin: Warm and dry, no rash or jaundice.   Psych: Alert and cooperative, normal mood and affect.  Labs: Lab Results  Component Value Date   CREATININE 0.59 (L) 05/10/2020   BUN <5 (L) 05/10/2020   NA 132 (L) 05/10/2020   K 4.6 05/10/2020   CL 98 05/10/2020   CO2 22 05/10/2020   Lab Results  Component Value Date   WBC 3.1 (L) 11/25/2019   HGB 14.1 11/25/2019   HCT 42.8 11/25/2019   MCV 100.7 (H) 11/25/2019   PLT 205 11/25/2019   Lab Results  Component Value Date   ALT 32 10/18/2019   AST 23 10/18/2019   ALKPHOS 87 10/18/2019   BILITOT 0.5 10/18/2019     Imaging Studies: No  results found.  Impression:  44 year old gentleman presenting for follow-up chronic GERD, rectal bleeding, significant weight loss (nearly 40 pounds in a year and total of 65 pounds in 3 years), change in bowels.  Chronic GERD: Typical symptoms well controlled.  His appetite is fair.  EGD back in August with moderately severe esophagitis endoscopically appeared to be Candida.  KOH was negative.  He was treated with 2 weeks of Diflucan.  As recommended by Dr. Abbey Chatters, repeat EGD planned to document healing.  He has been on pantoprazole 40 mg once daily.  Insurance would not cover twice daily.  Rectal bleeding: Resolved  Frequent stools: Complains of postprandial stool every time he eats.  Stools are formed to soft.  Complains of nocturnal stools.  In the past he has had notable oil in his stool but not as bad lately.  Has 4-6 stools per day.  Small bowel biopsies negative for celiac.  Colonoscopy findings do not explain his symptoms.  Trial of pancreatic enzymes given weight loss and possible malabsorption in the setting of prior pancreatitis.    Abnormal weight loss: Over the past 5 months, somewhat stable.  Significant weight loss over the past 3 years as outlined.  Unclear etiology.  Patient blames on stress.  His appetite is intermittent.  As outlined above, patient has a history of pancreatic pseudocyst, consider further imaging given weight loss.  Plan:  1. Upper endoscopy with Dr. Abbey Chatters, ASA II.  I  have discussed the risks, alternatives, benefits with regards to but not limited to the risk of reaction to medication, bleeding, infection, perforation and the patient is agreeable to proceed. Written consent to be obtained. 2. MR abdomen/MRCP with and without contrast, surveillance of liver lesion and pancreatic cyst 3. Creon 36,000, 2 capsules with meals and 1 capsule with snacks.

## 2020-08-27 NOTE — Patient Instructions (Signed)
1. Start Creon (pancreatic enzymes). Take 2 with meals (take with first bite of food) and 1 with snack (total of 8 capsules per day).  2. I will be in touch regarding if repeat endoscopy is needed and possible MRI.

## 2020-08-28 ENCOUNTER — Encounter: Payer: Self-pay | Admitting: Gastroenterology

## 2020-08-28 ENCOUNTER — Telehealth: Payer: Self-pay | Admitting: Gastroenterology

## 2020-08-28 DIAGNOSIS — R634 Abnormal weight loss: Secondary | ICD-10-CM

## 2020-08-28 DIAGNOSIS — K769 Liver disease, unspecified: Secondary | ICD-10-CM

## 2020-08-28 DIAGNOSIS — K863 Pseudocyst of pancreas: Secondary | ICD-10-CM

## 2020-08-28 NOTE — Telephone Encounter (Signed)
Patient seen in the office yesterday.  Please let him know that we need to go ahead with a EGD with Dr. Abbey Chatters. ASA II. Diagnosis: moderate to severe reflux esophagitis, document healing.  He also needs MRCP/MRI abdomen with and without contrast for surveillance of liver lesion, pancreatic cyst, abnormal weight loss.  He will needs labs before his MRI CMET, lipase, CBC, TTG IgA, serum IgA

## 2020-09-02 ENCOUNTER — Other Ambulatory Visit: Payer: Self-pay | Admitting: Gastroenterology

## 2020-09-02 ENCOUNTER — Other Ambulatory Visit: Payer: Self-pay

## 2020-09-02 DIAGNOSIS — R634 Abnormal weight loss: Secondary | ICD-10-CM

## 2020-09-02 DIAGNOSIS — K862 Cyst of pancreas: Secondary | ICD-10-CM

## 2020-09-02 NOTE — Telephone Encounter (Signed)
Phoned and left pt message to return our call, ran lab orders, and routing to [facility] clinical pool to schedule.

## 2020-09-02 NOTE — Progress Notes (Signed)
error 

## 2020-09-02 NOTE — H&P (View-Only) (Signed)
error 

## 2020-09-02 NOTE — Addendum Note (Signed)
Addended by: Mahala Menghini on: 09/02/2020 10:09 AM   Modules accepted: Orders

## 2020-09-03 NOTE — Telephone Encounter (Signed)
Phoned pt this morning spoke with him regarding his labs as well as being scheduled for EGD ASA II, and MRCP/MRI abdomen with and without contrast of the abdomen. He is agreeable to have these labs and procedures done. Lab forms in the mail to pt per his request. Routing to [facility] clinical pool to schedule.

## 2020-09-03 NOTE — Telephone Encounter (Signed)
Called [employer] and PA was approved for MRI. Auth# [id number] DOS 09/03/2020-11/02/2020.

## 2020-09-03 NOTE — Telephone Encounter (Addendum)
Called pt. Aware MRI/MRCP scheduled for 12/9 at 8:00am, arrival 7:30am, npo 4 hrs prior. Also scheduled for EGD 12/20 at 7:30am. covid test on 12/17 at 3:50pm. Aware will mail instructions with appt. Confirmed mailing address.   Called [employer] and spoke with [place]. She advised no PA is required for EGD.  I will need to call [employer] regarding PA for MRI [phone]

## 2020-09-03 NOTE — Addendum Note (Signed)
Addended by: Cheron Every on: 09/03/2020 09:34 AM   Modules accepted: Orders

## 2020-09-03 NOTE — Telephone Encounter (Signed)
Once patient is aware of plan and is agreeable [facility] clinical pool will schedule. Fowarding to [place] to f/u with patient regarding recs from Neil Crouch.

## 2020-09-06 NOTE — Telephone Encounter (Signed)
noted 

## 2020-09-12 ENCOUNTER — Ambulatory Visit (HOSPITAL_COMMUNITY): Payer: Medicare Other

## 2020-09-19 ENCOUNTER — Encounter (HOSPITAL_COMMUNITY): Payer: Self-pay

## 2020-09-20 ENCOUNTER — Other Ambulatory Visit (HOSPITAL_COMMUNITY)
Admission: RE | Admit: 2020-09-20 | Discharge: 2020-09-20 | Disposition: A | Discharge status: Home / Self Care | Payer: Medicare Other | Source: Ambulatory Visit | Attending: Internal Medicine | Admitting: Internal Medicine

## 2020-09-20 ENCOUNTER — Other Ambulatory Visit: Payer: Self-pay

## 2020-09-20 DIAGNOSIS — Z20822 Contact with and (suspected) exposure to covid-19: Secondary | ICD-10-CM | POA: Insufficient documentation

## 2020-09-20 DIAGNOSIS — Z01812 Encounter for preprocedural laboratory examination: Secondary | ICD-10-CM | POA: Insufficient documentation

## 2020-09-21 LAB — SARS CORONAVIRUS 2 (TAT 6-24 HRS): SARS Coronavirus 2: NEGATIVE

## 2020-09-23 ENCOUNTER — Ambulatory Visit (HOSPITAL_COMMUNITY): Payer: Medicare Other | Admitting: Anesthesiology

## 2020-09-23 ENCOUNTER — Encounter (HOSPITAL_COMMUNITY)
Admission: RE | Disposition: A | Discharge status: Home / Self Care | Payer: Self-pay | Source: Home / Self Care | Attending: Internal Medicine

## 2020-09-23 ENCOUNTER — Other Ambulatory Visit: Payer: Self-pay

## 2020-09-23 ENCOUNTER — Ambulatory Visit (HOSPITAL_COMMUNITY)
Admission: RE | Admit: 2020-09-23 | Discharge: 2020-09-23 | Disposition: A | Discharge status: Home / Self Care | Payer: Medicare Other | Attending: Internal Medicine | Admitting: Internal Medicine

## 2020-09-23 ENCOUNTER — Encounter (HOSPITAL_COMMUNITY): Payer: Self-pay

## 2020-09-23 DIAGNOSIS — K2289 Other specified disease of esophagus: Secondary | ICD-10-CM | POA: Insufficient documentation

## 2020-09-23 DIAGNOSIS — Z8719 Personal history of other diseases of the digestive system: Secondary | ICD-10-CM | POA: Insufficient documentation

## 2020-09-23 DIAGNOSIS — F17210 Nicotine dependence, cigarettes, uncomplicated: Secondary | ICD-10-CM | POA: Insufficient documentation

## 2020-09-23 DIAGNOSIS — K2100 Gastro-esophageal reflux disease with esophagitis, without bleeding: Secondary | ICD-10-CM | POA: Insufficient documentation

## 2020-09-23 DIAGNOSIS — Z79899 Other long term (current) drug therapy: Secondary | ICD-10-CM | POA: Diagnosis not present

## 2020-09-23 DIAGNOSIS — K2970 Gastritis, unspecified, without bleeding: Secondary | ICD-10-CM | POA: Diagnosis not present

## 2020-09-23 DIAGNOSIS — R194 Change in bowel habit: Secondary | ICD-10-CM | POA: Diagnosis not present

## 2020-09-23 DIAGNOSIS — Z794 Long term (current) use of insulin: Secondary | ICD-10-CM | POA: Insufficient documentation

## 2020-09-23 HISTORY — PX: ESOPHAGOGASTRODUODENOSCOPY (EGD) WITH PROPOFOL: SHX5813

## 2020-09-23 HISTORY — PX: BIOPSY: SHX5522

## 2020-09-23 LAB — GLUCOSE, CAPILLARY: Glucose-Capillary: 242 mg/dL — ABNORMAL HIGH (ref 70–99)

## 2020-09-23 SURGERY — ESOPHAGOGASTRODUODENOSCOPY (EGD) WITH PROPOFOL
Anesthesia: General

## 2020-09-23 MED ORDER — PROPOFOL 10 MG/ML IV BOLUS
INTRAVENOUS | Status: DC | PRN
  Administered 2020-09-23: 50 mg via INTRAVENOUS
  Administered 2020-09-23 (×2): 100 mg via INTRAVENOUS

## 2020-09-23 MED ORDER — LACTATED RINGERS IV SOLN: INTRAVENOUS | Status: DC | PRN

## 2020-09-23 MED ORDER — PHENYLEPHRINE 40 MCG/ML (10ML) SYRINGE FOR IV PUSH (FOR BLOOD PRESSURE SUPPORT)
PREFILLED_SYRINGE | INTRAVENOUS | Status: AC
  Filled 2020-09-23: qty 10

## 2020-09-23 MED ORDER — LIDOCAINE VISCOUS HCL 2 % MT SOLN
15.0000 mL | Freq: Once | OROMUCOSAL | Status: AC
  Administered 2020-09-23: 07:00:00 15 mL via OROMUCOSAL

## 2020-09-23 MED ORDER — LIDOCAINE HCL (CARDIAC) PF 100 MG/5ML IV SOSY
PREFILLED_SYRINGE | INTRAVENOUS | Status: DC | PRN
  Administered 2020-09-23: 50 mg via INTRATRACHEAL

## 2020-09-23 MED ORDER — LIDOCAINE HCL (PF) 2 % IJ SOLN
INTRAMUSCULAR | Status: AC
  Filled 2020-09-23: qty 10

## 2020-09-23 MED ORDER — GLYCOPYRROLATE 0.2 MG/ML IJ SOLN
0.2000 mg | Freq: Once | INTRAMUSCULAR | Status: AC
  Administered 2020-09-23: 07:00:00 0.2 mg via INTRAVENOUS

## 2020-09-23 MED ORDER — PROPOFOL 10 MG/ML IV BOLUS
INTRAVENOUS | Status: AC
  Filled 2020-09-23: qty 120

## 2020-09-23 MED ORDER — LIDOCAINE VISCOUS HCL 2 % MT SOLN
OROMUCOSAL | Status: AC
  Filled 2020-09-23: qty 15

## 2020-09-23 MED ORDER — PHENYLEPHRINE HCL (PRESSORS) 10 MG/ML IV SOLN
INTRAVENOUS | Status: DC | PRN
  Administered 2020-09-23 (×2): 40 ug via INTRAVENOUS

## 2020-09-23 MED ORDER — GLYCOPYRROLATE 0.2 MG/ML IJ SOLN
INTRAMUSCULAR | Status: AC
  Filled 2020-09-23: qty 1

## 2020-09-23 MED ORDER — LACTATED RINGERS IV SOLN: Freq: Once | INTRAVENOUS | Status: AC

## 2020-09-23 MED ORDER — STERILE WATER FOR IRRIGATION IR SOLN
Status: DC | PRN
  Administered 2020-09-23: 08:00:00 100 mL

## 2020-09-23 NOTE — Anesthesia Preprocedure Evaluation (Addendum)
Anesthesia Evaluation  Patient identified by MRN, date of birth, ID band Patient awake    Reviewed: Allergy & Precautions, NPO status , Patient's Chart, lab work & pertinent test results  History of Anesthesia Complications Negative for: history of anesthetic complications  Airway Mallampati: III  TM Distance: >3 FB Neck ROM: Full   Comment: Neck fusion, Neck pain Dental  (+) Dental Advisory Given, Teeth Intact   Pulmonary neg sleep apnea, neg COPD,  COPD inhaler, Current SmokerPatient did not abstain from smoking.,    Pulmonary exam normal breath sounds clear to auscultation       Cardiovascular hypertension, Pt. on medications Normal cardiovascular exam Rhythm:Regular Rate:Normal     Neuro/Psych PSYCHIATRIC DISORDERS  Neuromuscular disease    GI/Hepatic GERD  Medicated and Poorly Controlled,(+)     substance abuse  alcohol use,   Endo/Other  diabetes, Well Controlled, Type 2, Oral Hypoglycemic Agents, Insulin Dependent  Renal/GU negative Renal ROS     Musculoskeletal  (+) Arthritis , Neck sx   Abdominal   Peds  Hematology   Anesthesia Other Findings Neck pain  Reproductive/Obstetrics                           Anesthesia Physical Anesthesia Plan  ASA: II  Anesthesia Plan: General   Post-op Pain Management:    Induction: Intravenous  PONV Risk Score and Plan: TIVA  Airway Management Planned: Nasal Cannula and Natural Airway  Additional Equipment:   Intra-op Plan:   Post-operative Plan:   Informed Consent: I have reviewed the patients History and Physical, chart, labs and discussed the procedure including the risks, benefits and alternatives for the proposed anesthesia with the patient or authorized representative who has indicated his/her understanding and acceptance.     Dental advisory given  Plan Discussed with: CRNA and Surgeon  Anesthesia Plan Comments:          Anesthesia Quick Evaluation

## 2020-09-23 NOTE — Anesthesia Postprocedure Evaluation (Signed)
Anesthesia Post Note  Patient: Todd Rodriguez  Procedure(s) Performed: ESOPHAGOGASTRODUODENOSCOPY (EGD) WITH PROPOFOL (N/A ) BIOPSY  Patient location during evaluation: Endoscopy Anesthesia Type: General Level of consciousness: awake and alert Pain management: pain level controlled Vital Signs Assessment: post-procedure vital signs reviewed and stable Respiratory status: spontaneous breathing, nonlabored ventilation, respiratory function stable and patient connected to nasal cannula oxygen Cardiovascular status: blood pressure returned to baseline and stable Postop Assessment: no apparent nausea or vomiting Anesthetic complications: no   No complications documented.   Last Vitals:  Vitals:   09/23/20 0757 09/23/20 0800  BP: 91/64 100/70  Pulse:    Resp:    Temp:    SpO2:  100%    Last Pain:  Vitals:   09/23/20 0757  TempSrc:   PainSc: 0-No pain                 Talitha Givens

## 2020-09-23 NOTE — Op Note (Signed)
[facility] Patient Name: Todd Rodriguez Procedure Date: 09/23/2020 7:08 AM MRN: [id number] Date of Birth: 05-08-1976 Attending MD: Elon Alas. Abbey Chatters DO CSN: [id number] Age: 44 Admit Type: Outpatient Procedure:                Upper GI endoscopy Indications:              Follow-up of reflux esophagitis Providers:                Elon Alas. Abbey Chatters, DO, Lambert Mody, Tammy                            Vaught, RN, Wynonia Musty Tech, Technician Referring MD:              Medicines:                See the Anesthesia note for documentation of the                            administered medications Complications:            No immediate complications. Estimated Blood Loss:     Estimated blood loss was minimal. Procedure:                Pre-Anesthesia Assessment:                           - The anesthesia plan was to use monitored                            anesthesia care (MAC).                           After obtaining informed consent, the endoscope was                            passed under direct vision. Throughout the                            procedure, the patient's blood pressure, pulse, and                            oxygen saturations were monitored continuously. The                            [id number]) was introduced through the mouth,                            and advanced to the second part of duodenum. The                            upper GI endoscopy was accomplished without                            difficulty. The patient tolerated the procedure                            well. Scope In:  7:36:17 AM Scope Out: 7:40:02 AM Total Procedure Duration: 0 hours 3 minutes 45 seconds  Findings:      The Z-line was irregular and was found 41 cm from the incisors. Biopsies       were taken with a cold forceps for histology.      Localized mild inflammation was found in the gastric antrum.      The duodenal bulb, first portion of the duodenum and second  portion of       the duodenum were normal. Impression:               - Z-line irregular, 41 cm from the incisors.                            Biopsied.                           - Gastritis.                           - Normal duodenal bulb, first portion of the                            duodenum and second portion of the duodenum. Moderate Sedation:      Per Anesthesia Care Recommendation:           - Patient has a contact number available for                            emergencies. The signs and symptoms of potential                            delayed complications were discussed with the                            patient. Return to normal activities tomorrow.                            Written discharge instructions were provided to the                            patient.                           - Resume previous diet.                           - Continue present medications.                           - Await pathology results.                           - Repeat upper endoscopy in 3 years for                            surveillance based on pathology results if positive  for Barrett's.                           - Return to GI clinic in 3 months. Procedure Code(s):        --- Professional ---                           [id number], Esophagogastroduodenoscopy, flexible,                            transoral; with biopsy, single or multiple Diagnosis Code(s):        --- Professional ---                           K22.8, Other specified diseases of esophagus                           K29.70, Gastritis, unspecified, without bleeding                           K21.00, Gastro-esophageal reflux disease with                            esophagitis, without bleeding CPT copyright 2019 American Medical Association. All rights reserved. The codes documented in this report are preliminary and upon coder review may  be revised to meet current compliance requirements. Elon Alas.  Abbey Chatters, DO [place] Abbey Chatters, DO 09/23/2020 7:47:55 AM This report has been signed electronically. Number of Addenda: 0

## 2020-09-23 NOTE — Interval H&P Note (Signed)
History and Physical Interval Note:  09/23/2020 7:30 AM  Todd Rodriguez  has presented today for surgery, with the diagnosis of moderate to severe reflux esophagitis, document healing.  The various methods of treatment have been discussed with the patient and family. After consideration of risks, benefits and other options for treatment, the patient has consented to  Procedure(s) with comments: ESOPHAGOGASTRODUODENOSCOPY (EGD) WITH PROPOFOL (N/A) - 7:30am as a surgical intervention.  The patient's history has been reviewed, patient examined, no change in status, stable for surgery.  I have reviewed the patient's chart and labs.  Questions were answered to the patient's satisfaction.     Eloise Harman

## 2020-09-23 NOTE — Discharge Instructions (Addendum)
EGD Discharge instructions Please read the instructions outlined below and refer to this sheet in the next few weeks. These discharge instructions provide you with general information on caring for yourself after you leave the hospital. Your doctor may also give you specific instructions. While your treatment has been planned according to the most current medical practices available, unavoidable complications occasionally occur. If you have any problems or questions after discharge, please call your doctor. ACTIVITY  You may resume your regular activity but move at a slower pace for the next 24 hours.   Take frequent rest periods for the next 24 hours.   Walking will help expel (get rid of) the air and reduce the bloated feeling in your abdomen.   No driving for 24 hours (because of the anesthesia (medicine) used during the test).   You may shower.   Do not sign any important legal documents or operate any machinery for 24 hours (because of the anesthesia used during the test).  NUTRITION  Drink plenty of fluids.   You may resume your normal diet.   Begin with a light meal and progress to your normal diet.   Avoid alcoholic beverages for 24 hours or as instructed by your caregiver.  MEDICATIONS  You may resume your normal medications unless your caregiver tells you otherwise.  WHAT YOU CAN EXPECT TODAY  You may experience abdominal discomfort such as a feeling of fullness or "gas" pains.  FOLLOW-UP  Your doctor will discuss the results of your test with you.  SEEK IMMEDIATE MEDICAL ATTENTION IF ANY OF THE FOLLOWING OCCUR:  Excessive nausea (feeling sick to your stomach) and/or vomiting.   Severe abdominal pain and distention (swelling).   Trouble swallowing.   Temperature over 101 F (37.8 C).   Rectal bleeding or vomiting of blood.   Your esophagitis has healed nicely.  Continue on Protonix 40 mg daily.  I did take biopsies of your esophagus to rule out a condition  called Barrett's esophagus which can be a precancerous condition.  Depending on these results we may need to repeat EGD in 3 to 5 years.  Await results, my office will contact you.  Follow-up with GI in 3 months or sooner if needed.  I hope you have a great rest of your week!  Elon Alas. Abbey Chatters, D.O. Gastroenterology and Hepatology [facility]

## 2020-09-23 NOTE — Transfer of Care (Signed)
Immediate Anesthesia Transfer of Care Note  Patient: Todd Rodriguez  Procedure(s) Performed: ESOPHAGOGASTRODUODENOSCOPY (EGD) WITH PROPOFOL (N/A ) BIOPSY  Patient Location: PACU  Anesthesia Type:MAC  Level of Consciousness: awake, alert  and oriented  Airway & Oxygen Therapy: Patient Spontanous Breathing  Post-op Assessment: Report given to RN, Post -op Vital signs reviewed and stable and Patient moving all extremities X 4  Post vital signs: Reviewed and stable  Last Vitals:  Vitals Value Taken Time  BP    Temp    Pulse    Resp    SpO2      Last Pain:  Vitals:   09/23/20 0733  TempSrc:   PainSc: 0-No pain      Patients Stated Pain Goal: 2 (01/77/93 9030)  Complications: No complications documented.

## 2020-09-24 ENCOUNTER — Telehealth: Payer: Self-pay

## 2020-09-24 LAB — SURGICAL PATHOLOGY

## 2020-09-24 NOTE — Telephone Encounter (Signed)
Called pt, informed him MRI has been approved for [facility]. He will reschedule appt for [facility]. Gave him phone# to central scheduling.

## 2020-09-24 NOTE — Telephone Encounter (Signed)
Todd Rodriguez this pt needs to reschedule his MRI appt, but he wants to know if it can be done in [place].

## 2020-10-01 ENCOUNTER — Encounter (HOSPITAL_COMMUNITY): Payer: Self-pay | Admitting: Internal Medicine

## 2020-10-01 ENCOUNTER — Telehealth: Payer: Self-pay | Admitting: Gastroenterology

## 2020-10-01 NOTE — Telephone Encounter (Signed)
Labs from 09/17/20:  TTG IgA <2 IgA 323 WBC 3.49 H/H 15.7/46.3 Platelet 188000 BUN 4 Cre 0.92 Tbili 1.1H AP 120H AST 18 ALT 30 Alb 3.8 Lipase 22  Reviewed EGD findings.   Await MRCP results next week.

## 2020-10-09 ENCOUNTER — Ambulatory Visit (HOSPITAL_COMMUNITY): Payer: Medicare Other

## 2020-10-18 ENCOUNTER — Ambulatory Visit (HOSPITAL_COMMUNITY): Payer: Medicare Other

## 2020-10-29 ENCOUNTER — Other Ambulatory Visit: Payer: Self-pay | Admitting: Gastroenterology

## 2020-10-29 ENCOUNTER — Other Ambulatory Visit: Payer: Self-pay

## 2020-10-29 ENCOUNTER — Ambulatory Visit (HOSPITAL_COMMUNITY)
Admission: RE | Admit: 2020-10-29 | Discharge: 2020-10-29 | Disposition: A | Discharge status: Home / Self Care | Payer: Medicare Other | Source: Ambulatory Visit | Attending: Gastroenterology | Admitting: Gastroenterology

## 2020-10-29 DIAGNOSIS — K863 Pseudocyst of pancreas: Secondary | ICD-10-CM | POA: Diagnosis present

## 2020-10-29 DIAGNOSIS — R634 Abnormal weight loss: Secondary | ICD-10-CM | POA: Diagnosis present

## 2020-10-29 DIAGNOSIS — K769 Liver disease, unspecified: Secondary | ICD-10-CM | POA: Diagnosis present

## 2020-10-29 MED ORDER — GADOBUTROL 1 MMOL/ML IV SOLN
10.0000 mL | Freq: Once | INTRAVENOUS | Status: AC | PRN
  Administered 2020-10-29: 10 mL via INTRAVENOUS

## 2020-10-30 ENCOUNTER — Telehealth: Payer: Self-pay | Admitting: Internal Medicine

## 2020-10-30 NOTE — Telephone Encounter (Signed)
Not enough information to do a referral   Need  Reason   Please get more information about requested referral  ( assume [facility] and not [facility] )

## 2020-10-30 NOTE — Telephone Encounter (Signed)
Patient would like a referral to [facility].  Please advise.

## 2020-10-31 NOTE — Telephone Encounter (Signed)
Patient stated to disregard the message he has seen a neurologist on yesterday.  Randilyn Foisy,cma

## 2020-11-18 ENCOUNTER — Other Ambulatory Visit: Payer: Self-pay | Admitting: Internal Medicine

## 2020-11-29 ENCOUNTER — Ambulatory Visit: Payer: Medicare Other | Admitting: Neurology

## 2020-12-21 ENCOUNTER — Other Ambulatory Visit: Payer: Self-pay | Admitting: Internal Medicine

## 2020-12-21 NOTE — Progress Notes (Signed)
Chief Complaint  Patient presents with  . Annual Exam    HPI: Patient  Todd Rodriguez  45 y.o. comes in today for Preventive Health Care visit and multiple medical problems follow-up He states he has a number of issues he wishes to discuss lv 8 21  In regard to his diabetes he has not gone back to endocrinology is taking 15 units of insulin Basaglar a day thousand twice daily Metformin and Jardiance 25.  Thinks that he does better with the insulin and the pills.  Has not adjusted dosage a.m. sugars are in the 180 range occasionally gets a low sugar occasionally goes up to 300 he does drink Powerade and Gatorade.  He was evaluated by the GI team for his weight loss and pancreas predicament's.  Is on Creon weaning off stools are better had endoscopy.  He is no longer losing weight.  Vision  Is off    Dry eye.   And has fu .    r handed but left hand is getting worse tingling left over from his cervical radiculopathy and surgery and now his finger ring is contracting cannot open up and bothersome he is right-handed.   Sleep continuing difficulty worked nights for 20+ years and.  Additions to stresses in life changes is continued to have a sleep problem.  Some nights he just does not sleep he states his mind will race.  He is now off the citalopram is willing and wants to try to go back on he does not think the Remeron helped in the past.  Follow-up with neurology Dr. Posey Pronto has been put off a couple times and is not until May.  This is in regard to his nerve deficit symptoms.  Blood pressure reported as controlled.  Same medication.  HLD taking statin medicine.  Continues to smoke 1 pack/day denies respiratory symptoms at this time.  Health Maintenance  Topic Date Due  . COVID-19 Vaccine (1) Never done  . OPHTHALMOLOGY EXAM  Never done  . TETANUS/TDAP  11/05/2018  . INFLUENZA VACCINE  02/08/2021 (Originally 05/05/2020)  . Hepatitis C Screening  12/23/2021 (Originally 02/10/1976)  .  HEMOGLOBIN A1C  06/25/2021  . FOOT EXAM  12/23/2021  . COLONOSCOPY (Pts 45-85yr Insurance coverage will need to be confirmed)  05/13/2030  . PNEUMOCOCCAL POLYSACCHARIDE VACCINE AGE 22-64 HIGH RISK  Completed  . HIV Screening  Completed  . HPV VACCINES  Aged Out   Health Maintenance Review LIFESTYLE:  Exercise: Trying to get back to riding a bike Tobacco/ETS:  1ppd  Alcohol:  Some now and then . Weekend  Sugar beverages: gatorade and powerade  Sleep: off   Drug use: no HH of   3  Lost  Pet nov .  Work: On disability    ROS: See HPI list GEN/ HEENT: No fever, significant weight changes sweats headaches  hearing changes, CV/ PULM; No chest pain shortness of breath cough, syncope,edema  change in exercise tolerance. GI /GU: No adominal pain, vomiting, change in bowel habits. No blood in the stool. No significant GU symptoms. SKIN/HEME: ,no acute skin rashes suspicious lesions or bleeding. No lymphadenopathy, nodules, masses.  NEURO/ PSYCH: See HPI IMM/ Allergy: No unusual infections.  Allergy .   REST of 12 system review negative except as per HPI   Past Medical History:  Diagnosis Date  . Acute pancreatitis 10/25/2013  . Arthritis   . Diabetes mellitus [place] complication [place]   . GERD (gastroesophageal reflux disease)   . Hepatic  steatosis   . History of pancreatitis   . Hypertension   . Myalgia and myositis 04/21/2014   much better from illness presumed infectious    has disability form  will complete    . Pancreatic pseudocyst     Past Surgical History:  Procedure Laterality Date  . ANTERIOR CERVICAL DECOMP/DISCECTOMY FUSION N/A 01/12/2018   Procedure: Revision ACDF C4-5, removal of hardware C5-6, exploration of fusion C5-6;  Surgeon: [name], MD;  Location: [facility];  Service: Orthopedics;  Laterality: N/A;  3.5 hrs  . BIOPSY  05/13/2020   Procedure: BIOPSY;  Surgeon: Eloise Harman, DO;  [facility];  Service: Endoscopy;;  . BIOPSY  09/23/2020    Procedure: BIOPSY;  Surgeon: Eloise Harman, DO;  [facility];  Service: Endoscopy;;  . COLONOSCOPY WITH PROPOFOL N/A 05/13/2020   Dr. Abbey Chatters: Nonbleeding internal hemorrhoids.  Few small mouth diverticula in the sigmoid colon.  2 cecal polyps, inflammatory.  Sigmoid colon polyp hyperplastic.  Localized area of granular mucosa in the transverse colon, status post biopsy which was unremarkable.  No microscopic colitis.  Plans for repeat colonoscopy in 5 years.  . ESOPHAGEAL BRUSHING  05/13/2020   Procedure: ESOPHAGEAL BRUSHING;  Surgeon: Eloise Harman, DO;  [facility];  Service: Endoscopy;;  mid esophagus  . ESOPHAGOGASTRODUODENOSCOPY (EGD) WITH PROPOFOL N/A 05/13/2020   Dr. Abbey Chatters: Moderately severe esophagitis with no bleeding found in the middle third of the esophagus, suspicious for Candida esophagitis.  KOH was negative. Gastritis, duodenal polyp.  Duodenal biopsy benign.  Duodenal bulb biopsy with Brunner's gland hyperplasia and mild changes suggestive of peptic injury.  No celiac disease seen.  GE junction biopsy showed GERD with mild inflammation consisten  . ESOPHAGOGASTRODUODENOSCOPY (EGD) WITH PROPOFOL N/A 09/23/2020   Procedure: ESOPHAGOGASTRODUODENOSCOPY (EGD) WITH PROPOFOL;  Surgeon: Eloise Harman, DO;  [facility];  Service: Endoscopy;  Laterality: N/A;  7:30am  . NECK SURGERY     2 disc  . POLYPECTOMY  05/13/2020   Procedure: POLYPECTOMY;  Surgeon: Eloise Harman, DO;  [facility];  Service: Endoscopy;;  . SPINAL FUSION      Family History  Problem Relation Age of Onset  . Diabetes Mother   . Hypertension Mother   . Diabetes Father   . Hypertension Father   . Arthritis Maternal Grandmother   . Hypertension Maternal Grandfather   . Diabetes Maternal Grandfather   . Colon cancer Neg Hx   . Pancreatitis Neg Hx   . Pancreatic cancer Neg Hx     Social History   Socioeconomic History  . Marital status: Married    Spouse  name: Not on file  . Number of children: 1  . Years of education: Not on file  . Highest education level: Not on file  Occupational History    Employer: [employer]  Tobacco Use  . Smoking status: Current Every Day Smoker    Packs/day: 1.00    Years: 12.00    Pack years: 12.00    Types: Cigarettes  . Smokeless tobacco: Never Used  Vaping Use  . Vaping Use: Never used  Substance and Sexual Activity  . Alcohol use: Yes    Comment: every weekend, one drink. years ago drank more regularly  . Drug use: No  . Sexual activity: Yes    Partners: Female    Comment: Patient's Wife  Other Topics Concern  . 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 albuterol (VENTOLIN HFA) 108 (90 Base) MCG/ACT inhaler TAKE 2 PUFFS BY MOUTH EVERY 6 HOURS AS NEEDED FOR WHEEZE OR SHORTNESS OF BREATH (Patient taking differently: Inhale 2 puffs into the lungs every 6 (six) hours as needed for wheezing or shortness of breath.) 1 g 1  . amlodipine-benazepril (LOTREL) 2.5-10 MG capsule TAKE 1 CAPSULE BY MOUTH DAILY. FOR HIGH BLOOD PRESSURE .DOSE ADJUSTMENT 90 capsule 0  . Aspirin-Acetaminophen (GOODYS BODY PAIN PO) Take 1 Package by mouth daily as needed (headache/pain).    Marland Kitchen atorvastatin (LIPITOR) 40 MG tablet TAKE 1 TABLET BY MOUTH EVERY DAY (Patient taking differently: Take 40 mg by mouth daily.) 90 tablet 1  . Brimonidine Tartrate (LUMIFY)  0.025 % SOLN Place 1 drop into both eyes daily as needed (eye irritation/dry eyes.).    Marland Kitchen Cyanocobalamin (VITAMIN B-12 PO) Take 1 tablet by mouth daily.    Marland Kitchen dicyclomine (BENTYL) 10 MG capsule Take 1 capsule (10 mg total) by mouth 4 (four) times daily -  before meals and at bedtime. As needed for abdominal cramping OR frequent stools. Hold for constipation. 120 capsule 1  . empagliflozin (JARDIANCE) 25 MG TABS tablet Take 25 mg by mouth daily. 30 tablet 6  . gabapentin (NEURONTIN) 300 MG capsule Take 300 mg by mouth 3 (three) times daily.     Marland Kitchen glucose blood (ONE TOUCH TEST STRIPS) test strip Use as instructed 100 each 11  . Insulin Glargine (BASAGLAR KWIKPEN) 100 UNIT/ML Inject 0.15 mLs (15 Units total) into the skin daily. Increase as directed 15 mL 1  . Insulin Pen Needle (B-D UF III MINI PEN NEEDLES) 31G X 5 MM MISC Use to inject basaglar daily 100 each 1  . lipase/protease/amylase (CREON) 36000 UNITS CPEP capsule Take two with meals and one with snacks (total of 8 per day) (Patient taking differently: Take 36,000-72,000 Units by mouth See admin instructions. Take two with meals and one with snacks (total of 8 per day)) 270 capsule 3  . LORazepam (ATIVAN) 1 MG tablet Take 1 tablet (1 mg total) by mouth every 8 (eight) hours as needed for anxiety. Panic attacks 20 tablet 0  . metFORMIN (GLUCOPHAGE) 1000 MG tablet Take 1 tablet (1,000 mg total) by mouth 2 (two) times daily with a meal. 180 tablet 3  . ondansetron (ZOFRAN ODT) 4 MG disintegrating tablet Take 1 tablet (4 mg total) by mouth every 8 (eight) hours as needed for nausea or vomiting. 20 tablet 0  . pantoprazole (PROTONIX) 40 MG tablet Take 40 mg by mouth daily.    . citalopram (CELEXA) 20 MG tablet TAKE 10 MG BY MOUTH PER DAY FOR 1 WEEK , THEN INCREASE TO 20 MG BY MOUTH PER DAY (Patient taking differently: Take 20 mg by mouth  daily as needed (depression).) 24 tablet 0   No facility-administered medications prior to visit.      EXAM:  BP 112/82 (BP Location: Left Arm, Patient Position: Sitting, Cuff Size: Normal)   Pulse 76   Temp 98.8 F (37.1 C) (Oral)   Ht 6' 2"  (1.88 m)   Wt 153 lb 9.6 oz (69.7 kg)   SpO2 96%   BMI 19.72 kg/m   Body mass index is 19.72 kg/m. Wt Readings from Last 3 Encounters:  12/23/20 153 lb 9.6 oz (69.7 kg)  12/23/20 156 lb 3.2 oz (70.9 kg)  08/27/20 150 lb 9.6 oz (68.3 kg)    Physical Exam: Vital signs reviewed OXB:DZHG is a well-developed well-nourished alert cooperative    who appearsr stated age in no acute distress.  HEENT: normocephalic atraumatic , Eyes: PERRL EOM's full, conjunctiva clear somewhat red +1, Nares: paten,t no deformity discharge or tenderness., Ears: no deformity EAC's clear TMs with normal landmarks. Mouth: Masked NECK: supple without masses, thyromegaly or bruits. CHEST/PULM:  Clear to auscultation and percussion breath sounds equal no wheeze , rales or rhonchi. No chest wall deformities or tenderness.. CV: PMI is nondisplaced, S1 S2 no gallops, murmurs, rubs. Peripheral pulses arewithout delay.No JVD .  ABDOMEN: Bowel sounds normal nontender  No guard or rebound, no hepato splenomegal no CVA tenderness.  No hernia. Extremtities:  No clubbing cyanosis or edema, no acute joint swelling left hand   Flexion ? Contracture ring finger cannot extend   No acute  Swelling  NEURO:  Oriented x3, cranial nerves 3-12 appear to be intact, no obvious focal weakness,gait within normal limits no abnormal reflexes or asymmetrical SKIN: No acute rashes normal turgor, color, no bruising or petechiae. PSYCH: Oriented, good eye contact, somewhat subdued normal cognition and speech.  Cognition and judgment appear normal. LN: no cervical axillary inguinal adenopathy Diabetic Foot Exam - Simple   Simple Foot Form Diabetic Foot exam was performed with the following findings: Yes 12/23/2020  1:56 PM  Visual Inspection No deformities, no ulcerations, no other skin breakdown  bilaterally: Yes See comments: Yes Sensation Testing Intact to touch and monofilament testing bilaterally: Yes Pulse Check Posterior Tibialis and Dorsalis pulse intact bilaterally: Yes Comments Thickened nails some scaling no ulcers pulses are present     Lab Results  Component Value Date   WBC 3.1 (L) 11/25/2019   HGB 14.1 11/25/2019   HCT 42.8 11/25/2019   PLT 205 11/25/2019   GLUCOSE 194 (H) 12/23/2020   CHOL 160 12/23/2020   TRIG 80.0 12/23/2020   HDL 69.60 12/23/2020   LDLDIRECT 193.0 03/21/2019   LDLCALC 74 12/23/2020   ALT 23 12/23/2020   AST 23 12/23/2020   NA 136 12/23/2020   K 3.3 (L) 12/23/2020   CL 94 (L) 12/23/2020   CREATININE 0.64 12/23/2020   BUN 3 (L) 12/23/2020   CO2 32 12/23/2020   TSH 1.65 12/23/2020   PSA 1.17 12/23/2020   HGBA1C 9.6 (H) 12/23/2020   MICROALBUR <0.7 10/18/2019    BP Readings from Last 3 Encounters:  12/23/20 112/82  12/23/20 140/86  09/23/20 100/70    Lab from 12 21 reviewed  ( gi ordered)   ASSESSMENT AND PLAN:  Discussed the following assessment and plan:    ICD-10-CM   1. Encounter for preventative adult health care exam with abnormal findings  Z00.01   2. Essential hypertension, benign  I10 Hemoglobin A1c    Lipid panel    PSA    Hepatic function  panel    Basic metabolic panel    TSH    Magnesium    Vitamin B12    Vitamin B12    Magnesium    TSH    Basic metabolic panel    Hepatic function panel    PSA    Lipid panel    Hemoglobin A1c  3. Uncontrolled type 2 diabetes mellitus with hyperglycemia (HCC)  E11.65 Hemoglobin A1c    Lipid panel    PSA    Hepatic function panel    Basic metabolic panel    TSH    Magnesium    Vitamin B12    Vitamin B12    Magnesium    TSH    Basic metabolic panel    Hepatic function panel    PSA    Lipid panel    Hemoglobin A1c  4. Medication management  Z79.899 Hemoglobin A1c    Lipid panel    PSA    Hepatic function panel    Basic metabolic panel    TSH     Magnesium    Vitamin B12    Vitamin B12    Magnesium    TSH    Basic metabolic panel    Hepatic function panel    PSA    Lipid panel    Hemoglobin A1c  5. Insomnia, unspecified type  G47.00 Hemoglobin A1c    Lipid panel    PSA    Hepatic function panel    Basic metabolic panel    TSH    Magnesium    Vitamin B12    Vitamin B12    Magnesium    TSH    Basic metabolic panel    Hepatic function panel    PSA    Lipid panel    Hemoglobin A1c  6. Contracture of finger joint, left  M24.542 Hemoglobin A1c    Lipid panel    PSA    Hepatic function panel    Basic metabolic panel    TSH    Magnesium    Vitamin B12    Vitamin B12    Magnesium    TSH    Basic metabolic panel    Hepatic function panel    PSA    Lipid panel    Hemoglobin A1c    Ambulatory referral to Hand Surgery  7. Tobacco dependence  F17.200 Hemoglobin A1c    Lipid panel    PSA    Hepatic function panel    Basic metabolic panel    TSH    Magnesium    Vitamin B12    Vitamin B12    Magnesium    TSH    Basic metabolic panel    Hepatic function panel    PSA    Lipid panel    Hemoglobin A1c   Advise stopping again  8. Dyslipidemia  E78.5 Hemoglobin A1c    Lipid panel    PSA    Hepatic function panel    Basic metabolic panel    TSH    Magnesium    Vitamin B12    Vitamin B12    Magnesium    TSH    Basic metabolic panel    Hepatic function panel    PSA    Lipid panel    Hemoglobin A1c   Taking medication  9. History of fusion of cervical spine  Z98.1 Hemoglobin A1c    Lipid panel    PSA    Hepatic function panel  Basic metabolic panel    TSH    Magnesium    Vitamin B12    Vitamin B12    Magnesium    TSH    Basic metabolic panel    Hepatic function panel    PSA    Lipid panel    Hemoglobin A1c  10. Vitamin B12 deficiency  E53.8 Hemoglobin A1c    Lipid panel    PSA    Hepatic function panel    Basic metabolic panel    TSH    Magnesium    Vitamin B12    Vitamin B12     Magnesium    TSH    Basic metabolic panel    Hepatic function panel    PSA    Lipid panel    Hemoglobin A1c  11. Screening PSA (prostate specific antigen)  Z12.5     delayed fu   With pcp and not seeing endocrinology  As planned  Will try to help out but needs fu Inc insuline to 20 and  Disc goals  Last a1c was 11   Stop the sugar  drinks   Power aid etc  Glad gi si better  Concerning to him left hand contraction  Will do hand referral  Sleep  Ongoing and dm meds control   ? If may be helped by sleep eval because of years of night work and  Superimposed  Will add back citalopram.  Plan follow-up in 3 months or as needed.  Return in about 3 months (around 03/25/2021) for depending on results.  Patient Care Team: Khai Torbert, Standley Brooking, MD as PCP - General (Internal Medicine) Mahala Menghini PA-C as Physician Assistant (Gastroenterology) Alda Berthold, DO as Consulting Physician (Neurology) [facility], Melanie Crazier, MD as Consulting Physician (Endocrinology) Eloise Harman, DO as Consulting Physician (Internal Medicine) Patient Instructions   Will do hand referral for contracture  Look in to record review   Consider  Neuro to check out your sleep problem since you had years of  Shift work .  May be adding to problem  DM goal  Fasting 120 and below   Post eating 180 or below is adequate.  For now may increase insulin to 18- 20 units per day (  Stop limit the sugar drinks )  For now would go back to trying the citalopram  20 mg for anxiety  Stress depression. Begin with 10 mg per day and increase to 20 mg per day  And dark at night light in day .    Will notify you  of labs when available.   Plan follow .  Up in 3 months    Preventive Care 108-1 Years Old, Male Preventive care refers to lifestyle choices and visits with your health care provider that can promote health and wellness. This includes:  A yearly physical exam. This is also called an annual wellness  visit.  Regular dental and eye exams.  Immunizations.  Screening for certain conditions.  Healthy lifestyle choices, such as: ? Eating a healthy diet. ? Getting regular exercise. ? Not using drugs or products that contain nicotine and tobacco. ? Limiting alcohol use. What can I expect for my preventive care visit? Physical exam Your health care provider will check your:  Height and weight. These may be used to calculate your BMI (body mass index). BMI is a measurement that tells if you are at a healthy weight.  Heart rate and blood pressure.  Body temperature.  Skin for  abnormal spots. Counseling Your health care provider may ask you questions about your:  Past medical problems.  Family's medical history.  Alcohol, tobacco, and drug use.  Emotional well-being.  Home life and relationship well-being.  Sexual activity.  Diet, exercise, and sleep habits.  Work and work Statistician.  Access to firearms. What immunizations do I need? Vaccines are usually given at various ages, according to a schedule. Your health care provider will recommend vaccines for you based on your age, medical history, and lifestyle or other factors, such as travel or where you work.   What tests do I need? Blood tests  Lipid and cholesterol levels. These may be checked every 5 years, or more often if you are over 13 years old.  Hepatitis C test.  Hepatitis B test. Screening  Lung cancer screening. You may have this screening every year starting at age 10 if you have a 30-pack-year history of smoking and currently smoke or have quit within the past 15 years.  Prostate cancer screening. Recommendations will vary depending on your family history and other risks.  Genital exam to check for testicular cancer or hernias.  Colorectal cancer screening. ? All adults should have this screening starting at age 56 and continuing until age 86. ? Your health care provider may recommend screening  at age 20 if you are at increased risk. ? You will have tests every 1-10 years, depending on your results and the type of screening test.  Diabetes screening. ? This is done by checking your blood sugar (glucose) after you have not eaten for a while (fasting). ? You may have this done every 1-3 years.  STD (sexually transmitted disease) testing, if you are at risk. Follow these instructions at home: Eating and drinking  Eat a diet that includes fresh fruits and vegetables, whole grains, lean protein, and low-fat dairy products.  Take vitamin and mineral supplements as recommended by your health care provider.  Do not drink alcohol if your health care provider tells you not to drink.  If you drink alcohol: ? Limit how much you have to 0-2 drinks a day. ? Be aware of how much alcohol is in your drink. In the [place] one drink equals one 12 oz bottle of beer (355 mL), one 5 oz glass of wine (148 mL), or one 1 oz glass of hard liquor (44 mL).   Lifestyle  Take daily care of your teeth and gums. Brush your teeth every morning and night with fluoride toothpaste. Floss one time each day.  Stay active. Exercise for at least 30 minutes 5 or more days each week.  Do not use any products that contain nicotine or tobacco, such as cigarettes, e-cigarettes, and chewing tobacco. If you need help quitting, ask your health care provider.  Do not use drugs.  If you are sexually active, practice safe sex. Use a condom or other form of protection to prevent STIs (sexually transmitted infections).  If told by your health care provider, take low-dose aspirin daily starting at age 51.  Find healthy ways to cope with stress, such as: ? Meditation, yoga, or listening to music. ? Journaling. ? Talking to a trusted person. ? Spending time with friends and family. Safety  Always wear your seat belt while driving or riding in a vehicle.  Do not drive: ? If you have been drinking alcohol. Do not ride  with someone who has been drinking. ? When you are tired or distracted. ? While texting.  Wear  a helmet and other protective equipment during sports activities.  If you have firearms in your house, make sure you follow all gun safety procedures. What's next?  Go to your health care provider once a year for an annual wellness visit.  Ask your health care provider how often you should have your eyes and teeth checked.  Stay up to date on all vaccines. This information is not intended to replace advice given to you by your health care provider. Make sure you discuss any questions you have with your health care provider. Document Revised: 06/20/2019 Document Reviewed: 09/15/2018 Elsevier Patient Education  2021 Carson K. Nyaisha Simao M.D.

## 2020-12-23 ENCOUNTER — Ambulatory Visit: Payer: Medicare Other | Admitting: Gastroenterology

## 2020-12-23 ENCOUNTER — Encounter: Payer: Self-pay | Admitting: Gastroenterology

## 2020-12-23 ENCOUNTER — Ambulatory Visit (INDEPENDENT_AMBULATORY_CARE_PROVIDER_SITE_OTHER): Payer: Medicare Other | Admitting: Internal Medicine

## 2020-12-23 ENCOUNTER — Ambulatory Visit: Payer: Medicare Other | Admitting: Neurology

## 2020-12-23 ENCOUNTER — Encounter: Payer: Self-pay | Admitting: Internal Medicine

## 2020-12-23 ENCOUNTER — Other Ambulatory Visit: Payer: Self-pay

## 2020-12-23 VITALS — BP 112/82 | HR 76 | Temp 98.8°F | Ht 74.0 in | Wt 153.6 lb

## 2020-12-23 VITALS — BP 140/86 | HR 106 | Temp 97.5°F | Ht 74.0 in | Wt 156.2 lb

## 2020-12-23 DIAGNOSIS — R195 Other fecal abnormalities: Secondary | ICD-10-CM

## 2020-12-23 DIAGNOSIS — G4700 Insomnia, unspecified: Secondary | ICD-10-CM | POA: Diagnosis not present

## 2020-12-23 DIAGNOSIS — E538 Deficiency of other specified B group vitamins: Secondary | ICD-10-CM

## 2020-12-23 DIAGNOSIS — Z79899 Other long term (current) drug therapy: Secondary | ICD-10-CM | POA: Diagnosis not present

## 2020-12-23 DIAGNOSIS — I10 Essential (primary) hypertension: Secondary | ICD-10-CM | POA: Diagnosis not present

## 2020-12-23 DIAGNOSIS — K219 Gastro-esophageal reflux disease without esophagitis: Secondary | ICD-10-CM

## 2020-12-23 DIAGNOSIS — Z125 Encounter for screening for malignant neoplasm of prostate: Secondary | ICD-10-CM

## 2020-12-23 DIAGNOSIS — E785 Hyperlipidemia, unspecified: Secondary | ICD-10-CM

## 2020-12-23 DIAGNOSIS — R634 Abnormal weight loss: Secondary | ICD-10-CM

## 2020-12-23 DIAGNOSIS — Z981 Arthrodesis status: Secondary | ICD-10-CM

## 2020-12-23 DIAGNOSIS — F17200 Nicotine dependence, unspecified, uncomplicated: Secondary | ICD-10-CM

## 2020-12-23 DIAGNOSIS — E1165 Type 2 diabetes mellitus with hyperglycemia: Secondary | ICD-10-CM

## 2020-12-23 DIAGNOSIS — M24542 Contracture, left hand: Secondary | ICD-10-CM

## 2020-12-23 DIAGNOSIS — Z0000 Encounter for general adult medical examination without abnormal findings: Secondary | ICD-10-CM | POA: Diagnosis not present

## 2020-12-23 DIAGNOSIS — Z0001 Encounter for general adult medical examination with abnormal findings: Secondary | ICD-10-CM

## 2020-12-23 LAB — BASIC METABOLIC PANEL
BUN: 3 mg/dL — ABNORMAL LOW (ref 6–23)
CO2: 32 mEq/L (ref 19–32)
Calcium: 9.8 mg/dL (ref 8.4–10.5)
Chloride: 94 mEq/L — ABNORMAL LOW (ref 96–112)
Creatinine, Ser: 0.64 mg/dL (ref 0.40–1.50)
GFR: 113.33 mL/min (ref >60.00)
Glucose, Bld: 194 mg/dL — ABNORMAL HIGH (ref 70–99)
Glucose: 194
Potassium: 3.3 mEq/L — ABNORMAL LOW (ref 3.5–5.1)
Sodium: 136 mEq/L (ref 135–145)

## 2020-12-23 LAB — MAGNESIUM: Magnesium: 1.7 mg/dL (ref 1.5–2.5)

## 2020-12-23 LAB — LIPID PANEL
Cholesterol: 160 (ref 0–200)
Cholesterol: 160 mg/dL (ref 0–200)
HDL: 69.6 mg/dL (ref >39.00)
LDL Cholesterol: 74 mg/dL (ref 0–99)
NonHDL: 90.33
Total CHOL/HDL Ratio: 2
Triglycerides: 80 (ref 40–160)
Triglycerides: 80 mg/dL (ref 0.0–149.0)
VLDL: 16 mg/dL (ref 0.0–40.0)

## 2020-12-23 LAB — VITAMIN B12: Vitamin B-12: 523 pg/mL (ref 211–911)

## 2020-12-23 LAB — HEPATIC FUNCTION PANEL
ALT: 23 U/L (ref 0–53)
AST: 23 U/L (ref 0–37)
Albumin: 4.5 g/dL (ref 3.5–5.2)
Alkaline Phosphatase: 86 U/L (ref 39–117)
Bilirubin, Direct: 0.2 mg/dL (ref 0.0–0.3)
Total Bilirubin: 0.7 mg/dL (ref 0.2–1.2)
Total Protein: 6.8 g/dL (ref 6.0–8.3)

## 2020-12-23 LAB — HEMOGLOBIN A1C: Hgb A1c MFr Bld: 9.6 % — ABNORMAL HIGH (ref 4.6–6.5)

## 2020-12-23 LAB — TSH: TSH: 1.65 u[IU]/mL (ref 0.35–4.50)

## 2020-12-23 LAB — PSA: PSA: 1.17 ng/mL (ref 0.10–4.00)

## 2020-12-23 MED ORDER — DICYCLOMINE HCL 10 MG PO CAPS: 10.0000 mg | ORAL_CAPSULE | Freq: Three times a day (TID) | ORAL | 1 refills | Status: DC

## 2020-12-23 MED ORDER — CITALOPRAM HYDROBROMIDE 20 MG PO TABS: ORAL_TABLET | ORAL | 1 refills | Status: DC

## 2020-12-23 NOTE — Patient Instructions (Addendum)
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  May be adding to problem  DM goal  Fasting 120 and below   Post eating 180 or below is adequate.  For now may increase insulin to 18- 20 units per day (  Stop limit the sugar drinks )  For now would go back to trying the citalopram  20 mg for anxiety  Stress depression. Begin with 10 mg per day and increase to 20 mg per day  And dark at night light in day .    Will notify you  of labs when available.   Plan follow .  Up in 3 months    Preventive Care 47-47 Years Old, Male Preventive care refers to lifestyle choices and visits with your health care provider that can promote health and wellness. This includes:  A yearly physical exam. This is also called an annual wellness visit.  Regular dental and eye exams.  Immunizations.  Screening for certain conditions.  Healthy lifestyle choices, such as: ? Eating a healthy diet. ? Getting regular exercise. ? Not using drugs or products that contain nicotine and tobacco. ? Limiting alcohol use. What can I expect for my preventive care visit? Physical exam Your health care provider will check your:  Height and weight. These may be used to calculate your BMI (body mass index). BMI is a measurement that tells if you are at a healthy weight.  Heart rate and blood pressure.  Body temperature.  Skin for abnormal spots. Counseling Your health care provider may ask you questions about your:  Past medical problems.  Family's medical history.  Alcohol, tobacco, and drug use.  Emotional well-being.  Home life and relationship well-being.  Sexual activity.  Diet, exercise, and sleep habits.  Work and work Statistician.  Access to firearms. What immunizations do I need? Vaccines are usually given at various ages, according to a schedule. Your health care provider will recommend  vaccines for you based on your age, medical history, and lifestyle or other factors, such as travel or where you work.   What tests do I need? Blood tests  Lipid and cholesterol levels. These may be checked every 5 years, or more often if you are over 5 years old.  Hepatitis C test.  Hepatitis B test. Screening  Lung cancer screening. You may have this screening every year starting at age 47 if you have a 30-pack-year history of smoking and currently smoke or have quit within the past 15 years.  Prostate cancer screening. Recommendations will vary depending on your family history and other risks.  Genital exam to check for testicular cancer or hernias.  Colorectal cancer screening. ? All adults should have this screening starting at age 47 and continuing until age 27. ? Your health care provider may recommend screening at age 47 if you are at increased risk. ? You will have tests every 1-10 years, depending on your results and the type of screening test.  Diabetes screening. ? This is done by checking your blood sugar (glucose) after you have not eaten for a while (fasting). ? You may have this done every 1-3 years.  STD (sexually transmitted disease) testing, if you are at risk. Follow these instructions at home: Eating and drinking  Eat a diet that includes fresh fruits and vegetables, whole grains, lean protein, and low-fat dairy products.  Take vitamin and mineral supplements as recommended by your health care provider.  Do not drink alcohol if your health care provider tells you not to drink.  If you drink alcohol: ? Limit how much you have to 0-2 drinks a day. ? Be aware of how much alcohol is in your drink. In the [place] one drink equals one 12 oz bottle of beer (355 mL), one 5 oz glass of wine (148 mL), or one 1 oz glass of hard liquor (44 mL).   Lifestyle  Take daily care of your teeth and gums. Brush your teeth every morning and night with fluoride toothpaste.  Floss one time each day.  Stay active. Exercise for at least 30 minutes 5 or more days each week.  Do not use any products that contain nicotine or tobacco, such as cigarettes, e-cigarettes, and chewing tobacco. If you need help quitting, ask your health care provider.  Do not use drugs.  If you are sexually active, practice safe sex. Use a condom or other form of protection to prevent STIs (sexually transmitted infections).  If told by your health care provider, take low-dose aspirin daily starting at age 47.  Find healthy ways to cope with stress, such as: ? Meditation, yoga, or listening to music. ? Journaling. ? Talking to a trusted person. ? Spending time with friends and family. Safety  Always wear your seat belt while driving or riding in a vehicle.  Do not drive: ? If you have been drinking alcohol. Do not ride with someone who has been drinking. ? When you are tired or distracted. ? While texting.  Wear a helmet and other protective equipment during sports activities.  If you have firearms in your house, make sure you follow all gun safety procedures. What's next?  Go to your health care provider once a year for an annual wellness visit.  Ask your health care provider how often you should have your eyes and teeth checked.  Stay up to date on all vaccines. This information is not intended to replace advice given to you by your health care provider. Make sure you discuss any questions you have with your health care provider. Document Revised: 06/20/2019 Document Reviewed: 09/15/2018 Elsevier Patient Education  2021 [employer].

## 2020-12-23 NOTE — Patient Instructions (Addendum)
1. For acid reflux, continue pantoprazole 40 mg daily before breakfast.   2. For frequent stools, continue pancreatic enzymes 2 with meals, 1 with snacks.  Frequent stools could be in part due to Metformin use.  We will add dicyclomine 10 mg before meals and at bedtime as needed for frequent stools.  If you see constipation develop, you will need to hold dicyclomine until stools become frequent again. 3. Continue to increase oral intake for weight gain purposes. 4. Monitor your weight weekly at home.  Call if you have any weight loss. 5. We will see back in 6 months.  If your weight continues to improve, we may try coming off of pancreatic enzymes at that time. 6. Call in the meantime with any questions or concerns.

## 2020-12-23 NOTE — Progress Notes (Signed)
Primary Care Physician: Burnis Medin, MD  Primary Gastroenterologist:  Elon Alas. Abbey Chatters, DO   Chief Complaint  Patient presents with  . Gastroesophageal Reflux    Doing ok    HPI: Todd Rodriguez is a 45 y.o. male here for follow-up.  He was seen back in November 2021.  History of chronic GERD, weight loss, rectal bleeding, pancreatic pseudocyst, hepatic steatosis, gallstones versus gallbladder sludge seen on MRI recently.  See note from August 27, 2020 detailed history/extensive work-up.  3 years ago he weighed 215 pounds.  Over a year ago he weighed 180 pounds.  When he was seen back in July 2021 he was down to 146 pounds. Weight up to 156 pounds today.  He had a EGD and colonoscopy in August 2021.  Noted to have moderately severe esophagitis with no bleeding found in the middle third of the esophagus, suspicious for Candida esophagitis.  KOH was negative.  He was treated with 2 weeks of Diflucan.  GE junction biopsy showed GERD with mild inflammation consistent with reflux.  Patient had gastritis.  Duodenal biopsies benign. Duodenal polyp biopsy, Brunner's gland hyperplasia and mild changes suggestive of peptic injury. No findings consistent with celiac.  On colonoscopy she had 2 cecal polyps removed, inflammatory.  Sigmoid colon polyp was hyperplastic.  Granular mucosa in the transverse colon biopsied, benign with no evidence of microscopic colitis.    Repeat EGD December 2021 to document healing and screen for Barrett's, he had irregular Z-line but negative biopsy for Barrett's esophagus.  He did have some ongoing chronic inflammation of the esophagus but much improved from prior EGD.  Gastritis noted.  Advised to continue daily pantoprazole.  Patient has gained 6 pounds since November 2021. States he is trying to eat all the time. Wants to get back to 200 pounds. No food intolerance or postprandial abdominal pain. BM 5-6 per day. Stools are mostly solid. May go 3 times back to  back in the morning. And later in night a couple of more times. Not sure if pancreatic enzymes has helped. No melena, brbpr. Does have nocturnal BMs although notes that he has difficulty sleeping at night as he used to work 3 shift for 20 years. Also does eat late at night at times. No n/v.     Current Outpatient Medications  Medication Sig Dispense Refill  . albuterol (VENTOLIN HFA) 108 (90 Base) MCG/ACT inhaler TAKE 2 PUFFS BY MOUTH EVERY 6 HOURS AS NEEDED FOR WHEEZE OR SHORTNESS OF BREATH (Patient taking differently: Inhale 2 puffs into the lungs every 6 (six) hours as needed for wheezing or shortness of breath.) 1 g 1  . amlodipine-benazepril (LOTREL) 2.5-10 MG capsule TAKE 1 CAPSULE BY MOUTH DAILY. FOR HIGH BLOOD PRESSURE .DOSE ADJUSTMENT 90 capsule 0  . Aspirin-Acetaminophen (GOODYS BODY PAIN PO) Take 1 Package by mouth daily as needed (headache/pain).    Marland Kitchen atorvastatin (LIPITOR) 40 MG tablet TAKE 1 TABLET BY MOUTH EVERY DAY (Patient taking differently: Take 40 mg by mouth daily.) 90 tablet 1  . Brimonidine Tartrate (LUMIFY) 0.025 % SOLN Place 1 drop into both eyes daily as needed (eye irritation/dry eyes.).    Marland Kitchen citalopram (CELEXA) 20 MG tablet TAKE 10 MG BY MOUTH PER DAY FOR 1 WEEK , THEN INCREASE TO 20 MG BY MOUTH PER DAY (Patient taking differently: Take 20 mg by mouth daily as needed (depression).) 24 tablet 0  . Cyanocobalamin (VITAMIN B-12 PO) Take 1 tablet by mouth daily.    Marland Kitchen  empagliflozin (JARDIANCE) 25 MG TABS tablet Take 25 mg by mouth daily. 30 tablet 6  . gabapentin (NEURONTIN) 300 MG capsule Take 300 mg by mouth 3 (three) times daily.     Marland Kitchen glucose blood (ONE TOUCH TEST STRIPS) test strip Use as instructed 100 each 11  . Insulin Glargine (BASAGLAR KWIKPEN) 100 UNIT/ML Inject 0.15 mLs (15 Units total) into the skin daily. Increase as directed 15 mL 1  . Insulin Pen Needle (B-D UF III MINI PEN NEEDLES) 31G X 5 MM MISC Use to inject basaglar daily 100 each 1  .  lipase/protease/amylase (CREON) 36000 UNITS CPEP capsule Take two with meals and one with snacks (total of 8 per day) (Patient taking differently: Take 36,000-72,000 Units by mouth See admin instructions. Take two with meals and one with snacks (total of 8 per day)) 270 capsule 3  . LORazepam (ATIVAN) 1 MG tablet Take 1 tablet (1 mg total) by mouth every 8 (eight) hours as needed for anxiety. Panic attacks 20 tablet 0  . metFORMIN (GLUCOPHAGE) 1000 MG tablet Take 1 tablet (1,000 mg total) by mouth 2 (two) times daily with a meal. 180 tablet 3  . ondansetron (ZOFRAN ODT) 4 MG disintegrating tablet Take 1 tablet (4 mg total) by mouth every 8 (eight) hours as needed for nausea or vomiting. 20 tablet 0  . pantoprazole (PROTONIX) 40 MG tablet Take 40 mg by mouth daily.     No current facility-administered medications for this visit.    Allergies as of 12/23/2020  . (No Known Allergies)    ROS:  General: Negative for anorexia, weight loss, fever, chills, fatigue, weakness.  Does not sleep well.  Worked third shift for 20 years and having difficulty adjusting. ENT: Negative for hoarseness, difficulty swallowing , nasal congestion. CV: Negative for chest pain, angina, palpitations, dyspnea on exertion, peripheral edema.  Respiratory: Negative for dyspnea at rest, dyspnea on exertion, cough, sputum, wheezing.  GI: See history of present illness. GU:  Negative for dysuria, hematuria, urinary incontinence, urinary frequency, nocturnal urination.  Endo: Negative for unusual weight change.    Physical Examination:   BP 140/86   Pulse (!) 106   Temp (!) 97.5 F (36.4 C) (Temporal)   Ht 6' 2"  (1.88 m)   Wt 156 lb 3.2 oz (70.9 kg)   BMI 20.05 kg/m   General: Well-nourished, well-developed in no acute distress.  Eyes: No icterus. Mouth: masked Lungs: Clear to auscultation bilaterally.  Heart: Regular rate and rhythm, no murmurs rubs or gallops.  Abdomen: Bowel sounds are normal, nontender,  nondistended, no hepatosplenomegaly or masses, no abdominal bruits or hernia , no rebound or guarding.   Extremities: No lower extremity edema. No clubbing or deformities. Neuro: Alert and oriented x 4   Skin: Warm and dry, no jaundice.   Psych: Alert and cooperative, normal mood and affect.  Labs:  Lab Results  Component Value Date   CREATININE 0.59 (L) 05/10/2020   BUN <5 (L) 05/10/2020   NA 132 (L) 05/10/2020   K 4.6 05/10/2020   CL 98 05/10/2020   CO2 22 05/10/2020   Lab Results  Component Value Date   ALT 32 10/18/2019   AST 23 10/18/2019   ALKPHOS 87 10/18/2019   BILITOT 0.5 10/18/2019   Lab Results  Component Value Date   WBC 3.1 (L) 11/25/2019   HGB 14.1 11/25/2019   HCT 42.8 11/25/2019   MCV 100.7 (H) 11/25/2019   PLT 205 11/25/2019   Lab Results  Component Value Date   [id number] 716 06/19/2019   Lab Results  Component Value Date   FOLATE 9.2 06/19/2019   Lab Results  Component Value Date   LIPASE 20 05/29/2019     Imaging Studies: No results found.  Assessment:  45 year old male with chronic GERD, rectal bleeding, significant weight loss, frequent stools, hepatic steatosis, pancreatic pseudocyst presenting for follow-up.  Abnormal weight loss: Weight loss has been stable over the past 9 months, weight up about 5 pounds since last visit November.  Still more than 50 pounds off his baseline.  Eating well.  MRI/MRCP last fall for follow-up of pancreatic pseudocyst and liver lesion, stable.  EGD and colonoscopy completed as above.  We will continue to monitor for now.  Chronic GERD: Symptoms controlled on pantoprazole daily.  Rectal bleeding: Resolved.  Frequent stools: 2-3 stools every morning, may occur a couple of times later in the evening.  Describes some nocturnal stools although notes that he does not sleep well because he always works third shift for over 20 years.  Pancreatic enzymes may have helped somewhat.  Celiac screen negative with  serologies and small bowel biopsies.  Colonoscopy did not explain symptoms.  Hepatic steatosis: Recommend avoiding alcohol.  We will continue to follow LFTs.  Pancreatic pseudocyst: Has been stable, no further work-up unless clinically develops symptoms.  Plan:  1. For acid reflux, continue pantoprazole 40 mg daily before breakfast.   2. For frequent stools, continue pancreatic enzymes 2 with meals, 1 with snacks.  Frequent stools could be in part due to Metformin use.  We will add dicyclomine 10 mg before meals and at bedtime as needed for frequent stools.  3. Continue to increase oral intake for weight gain purposes. 4. Monitor your weight weekly at home.  Call if you have any weight loss. 5. We will see back in 6 months.  If weight continues to improve, we may try coming off of pancreatic enzymes at that time. 6. Call in the meantime with any questions or concerns.

## 2020-12-24 NOTE — Progress Notes (Signed)
CC'ED TO PCP 

## 2020-12-27 ENCOUNTER — Ambulatory Visit: Payer: Medicare Other | Admitting: Neurology

## 2021-01-02 NOTE — Progress Notes (Signed)
So a 1c is better but still not in control  keep working on  a1c down with  Cholesterol is good on medication.  Potassium is slightly low again still not sure why unless you are having diarrhea or on a fluid pill .( dont see that on your list)   increase  potassium foods   and try salt substitute that has  potassium in it   Then plan  repeat bmp in a month  keep appt  in June

## 2021-01-03 NOTE — Addendum Note (Signed)
Addended by: Nilda Riggs on: 01/03/2021 09:43 AM   Modules accepted: Orders

## 2021-01-14 ENCOUNTER — Other Ambulatory Visit: Payer: Self-pay | Admitting: Gastroenterology

## 2021-01-30 ENCOUNTER — Other Ambulatory Visit: Payer: Self-pay | Admitting: Gastroenterology

## 2021-02-10 ENCOUNTER — Telehealth: Payer: Self-pay | Admitting: Internal Medicine

## 2021-02-10 NOTE — Telephone Encounter (Signed)
Left message for patient to call back and schedule Medicare Annual Wellness Visit (AWV) either virtually or in office.   awvi 12/03/20 per [facility]   please schedule at anytime with [facility] Nurse Health Advisor 1 or 2   This should be a 45 minute visit.

## 2021-02-13 ENCOUNTER — Ambulatory Visit: Payer: Medicare Other

## 2021-02-13 NOTE — Progress Notes (Deleted)
Subjective:   Todd Rodriguez is a 45 y.o. male who presents for an Initial Medicare Annual Wellness Visit.  Review of Systems    n/a       Objective:    There were no vitals filed for this visit. There is no height or weight on file to calculate BMI.  Advanced Directives 09/23/2020 05/13/2020 04/30/2020 11/25/2019 10/11/2019 06/19/2019 05/28/2019  Does Patient Have a Medical Advance Directive? No No No No No No No  Would patient like information on creating a medical advance directive? No - Patient declined No - Patient declined No - Patient declined - - - No - Patient declined  Pre-existing out of facility DNR order (yellow form or pink MOST form) - - - - - - -    Current Medications (verified) Outpatient Encounter Medications as of 02/13/2021  Medication Sig  . albuterol (VENTOLIN HFA) 108 (90 Base) MCG/ACT inhaler TAKE 2 PUFFS BY MOUTH EVERY 6 HOURS AS NEEDED FOR WHEEZE OR SHORTNESS OF BREATH (Patient taking differently: Inhale 2 puffs into the lungs every 6 (six) hours as needed for wheezing or shortness of breath.)  . amlodipine-benazepril (LOTREL) 2.5-10 MG capsule TAKE 1 CAPSULE BY MOUTH DAILY. FOR HIGH BLOOD PRESSURE .DOSE ADJUSTMENT  . Aspirin-Acetaminophen (GOODYS BODY PAIN PO) Take 1 Package by mouth daily as needed (headache/pain).  Marland Kitchen atorvastatin (LIPITOR) 40 MG tablet TAKE 1 TABLET BY MOUTH EVERY DAY (Patient taking differently: Take 40 mg by mouth daily.)  . Brimonidine Tartrate (LUMIFY) 0.025 % SOLN Place 1 drop into both eyes daily as needed (eye irritation/dry eyes.).  Marland Kitchen citalopram (CELEXA) 20 MG tablet Take 10 mg per day for 1-2 weeks then increase to 20 mg per day or as directed  . Cyanocobalamin (VITAMIN B-12 PO) Take 1 tablet by mouth daily.  Marland Kitchen dicyclomine (BENTYL) 10 MG capsule TAKE 1 CAPSULE FOUR TIMES A DAY BEFORE MEALS AND AT BEDTIME AS NEEDED FOR ABDOMINAL CRAMPING  . empagliflozin (JARDIANCE) 25 MG TABS tablet Take 25 mg by mouth daily.  Marland Kitchen gabapentin (NEURONTIN)  300 MG capsule Take 300 mg by mouth 3 (three) times daily.   Marland Kitchen glucose blood (ONE TOUCH TEST STRIPS) test strip Use as instructed  . Insulin Glargine (BASAGLAR KWIKPEN) 100 UNIT/ML INJECT 0.15 MLS (15 UNITS TOTAL) INTO THE SKIN DAILY. INCREASE AS DIRECTED  . Insulin Pen Needle (B-D UF III MINI PEN NEEDLES) 31G X 5 MM MISC Use to inject basaglar daily  . lipase/protease/amylase (CREON) 36000 UNITS CPEP capsule Take two with meals and one with snacks (total of 8 per day) (Patient taking differently: Take 36,000-72,000 Units by mouth See admin instructions. Take two with meals and one with snacks (total of 8 per day))  . LORazepam (ATIVAN) 1 MG tablet Take 1 tablet (1 mg total) by mouth every 8 (eight) hours as needed for anxiety. Panic attacks  . metFORMIN (GLUCOPHAGE) 1000 MG tablet Take 1 tablet (1,000 mg total) by mouth 2 (two) times daily with a meal.  . ondansetron (ZOFRAN ODT) 4 MG disintegrating tablet Take 1 tablet (4 mg total) by mouth every 8 (eight) hours as needed for nausea or vomiting.  . pantoprazole (PROTONIX) 40 MG tablet Take 40 mg by mouth daily.   No facility-administered encounter medications on file as of 02/13/2021.    Allergies (verified) Patient has no known allergies.   History: Past Medical History:  Diagnosis Date  . Acute pancreatitis 10/25/2013  . Arthritis   . Diabetes mellitus without complication ([place])   .  GERD (gastroesophageal reflux disease)   . Hepatic steatosis   . History of pancreatitis   . Hypertension   . Myalgia and myositis 04/21/2014   much better from illness presumed infectious    has disability form  will complete    . Pancreatic pseudocyst    Past Surgical History:  Procedure Laterality Date  . ANTERIOR CERVICAL DECOMP/DISCECTOMY FUSION N/A 01/12/2018   Procedure: Revision ACDF C4-5, removal of hardware C5-6, exploration of fusion C5-6;  Surgeon: [name], MD;  Location: [place];  Service: Orthopedics;  Laterality: N/A;  3.5 hrs  .  BIOPSY  05/13/2020   Procedure: BIOPSY;  Surgeon: Eloise Harman, DO;  [facility];  Service: Endoscopy;;  . BIOPSY  09/23/2020   Procedure: BIOPSY;  Surgeon: Eloise Harman, DO;  [facility];  Service: Endoscopy;;  . COLONOSCOPY WITH PROPOFOL N/A 05/13/2020   Dr. Abbey Chatters: Nonbleeding internal hemorrhoids.  Few small mouth diverticula in the sigmoid colon.  2 cecal polyps, inflammatory.  Sigmoid colon polyp hyperplastic.  Localized area of granular mucosa in the transverse colon, status post biopsy which was unremarkable.  No microscopic colitis.  Plans for repeat colonoscopy in 5 years.  . ESOPHAGEAL BRUSHING  05/13/2020   Procedure: ESOPHAGEAL BRUSHING;  Surgeon: Eloise Harman, DO;  [facility];  Service: Endoscopy;;  mid esophagus  . ESOPHAGOGASTRODUODENOSCOPY (EGD) WITH PROPOFOL N/A 05/13/2020   Dr. Abbey Chatters: Moderately severe esophagitis with no bleeding found in the middle third of the esophagus, suspicious for Candida esophagitis.  KOH was negative. Gastritis, duodenal polyp.  Duodenal biopsy benign.  Duodenal bulb biopsy with Brunner's gland hyperplasia and mild changes suggestive of peptic injury.  No celiac disease seen.  GE junction biopsy showed GERD with mild inflammation consisten  . ESOPHAGOGASTRODUODENOSCOPY (EGD) WITH PROPOFOL N/A 09/23/2020   Procedure: ESOPHAGOGASTRODUODENOSCOPY (EGD) WITH PROPOFOL;  Surgeon: Eloise Harman, DO;  [facility];  Service: Endoscopy;  Laterality: N/A;  7:30am  . NECK SURGERY     2 disc  . POLYPECTOMY  05/13/2020   Procedure: POLYPECTOMY;  Surgeon: Eloise Harman, DO;  [facility];  Service: Endoscopy;;  . SPINAL FUSION     Family History  Problem Relation Age of Onset  . Diabetes Mother   . Hypertension Mother   . Diabetes Father   . Hypertension Father   . Arthritis Maternal Grandmother   . Hypertension Maternal Grandfather   . Diabetes Maternal Grandfather   . Colon cancer Neg  Hx   . Pancreatitis Neg Hx   . Pancreatic cancer Neg Hx    Social History   Socioeconomic History  . Marital status: Married    Spouse name: Not on file  . Number of children: 1  . Years of education: Not on file  . Highest education level: Not on file  Occupational History    Employer: [employer]  Tobacco Use  . Smoking status: Current Every Day Smoker    Packs/day: 1.00    Years: 12.00    Pack years: 12.00    Types: Cigarettes  . Smokeless tobacco: Never Used  Vaping Use  . Vaping Use: Never used  Substance and Sexual Activity  . Alcohol use: Yes    Comment: every weekend, one drink. years ago drank more regularly  . Drug use: No  . Sexual activity: Yes    Partners: Female    Comment: Patient's Wife  Other Topics Concern  . 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  No  Does the patient have any non-healing wounds?  No  Has the patient had any unintentional weight loss or weight gain?  No   Diabetes:  Is the patient diabetic?  Yes  If diabetic, was a CBG obtained today?  No  Did the patient bring in their glucometer from home?  No  How often do you monitor your CBG's? ***.   Financial Strains and Diabetes Management:  Are you having any  financial strains with the device, your supplies or your medication? {YES/NO:21197}.  Does the patient want to be seen by Chronic Care Management for management of their diabetes?  {YES/NO:21197} Would the patient like to be referred to a Nutritionist or for Diabetic Management?  {YES/NO:21197}  Diabetic Exams:  {Diabetic Eye Exam:2101801} {Diabetic Foot Exam:2101802}          Activities of Daily Living In your present state of health, do you have any difficulty performing the following activities: 04/30/2020  Hearing? N  Vision? N  Difficulty concentrating or making decisions? N  Walking or climbing stairs? N  Dressing or bathing? N  Doing errands, shopping? N  Some recent data might be hidden    Patient Care Team: Panosh, Standley Brooking, MD as PCP - General (Internal Medicine) Mahala Menghini PA-C as Physician Assistant (Gastroenterology) Alda Berthold, DO as Consulting Physician (Neurology) [facility], Melanie Crazier, MD as Consulting Physician (Endocrinology) Eloise Harman, DO as Consulting Physician (Internal Medicine)  Indicate any recent Medical Services you may have received from other than [facility] providers in the past year (date may be approximate).     Assessment:   This is a routine wellness examination for Todd Rodriguez.  Hearing/Vision screen No exam data present  Dietary issues and exercise activities discussed:    Goals Addressed   None    Depression Screen PHQ 2/9 Scores 12/23/2020 04/17/2019 01/25/2015  PHQ - 2 Score 2 0 0  PHQ- 9 Score 9 - -    Fall Risk Fall Risk  10/11/2019 06/19/2019  Falls in the past year? 1 1  Number falls in past yr: 1 1  Injury with Fall? 1 1    FALL RISK PREVENTION PERTAINING TO THE HOME:  Any stairs in or around the home? {YES/NO:21197} If so, are there any without handrails? No  Home free of loose throw rugs in walkways, pet beds, electrical cords, etc? Yes  Adequate lighting in your home to reduce risk of falls? Yes    ASSISTIVE DEVICES UTILIZED TO PREVENT FALLS:  Life alert? No  Use of a cane, walker or w/c? {YES/NO:21197} Grab bars in the bathroom? {YES/NO:21197} Shower chair or bench in shower? {YES/NO:21197} Elevated toilet seat or a handicapped toilet? {YES/NO:21197}  TIMED UP AND GO:  Was the test performed? {YES/NO:21197}.  Length of time to ambulate 10 feet: *** sec.   {Appearance of TXMI:6803212}  Cognitive Function:   Normal cognitive status assessed by direct observation by this Nurse Health Advisor. No abnormalities found.        Immunizations Immunization History  Administered Date(s) Administered  . Pneumococcal Conjugate-13 01/15/2016  . Pneumococcal Polysaccharide-23 09/12/2018    TDAP status: Due, Education has been provided regarding the importance of this vaccine. Advised may receive this vaccine at local pharmacy or Health Dept. Aware to provide a copy of the vaccination record if obtained from local pharmacy or Health Dept. Verbalized acceptance and understanding.  Flu Vaccine status: Due, Education has been provided regarding the importance of this vaccine. Advised may receive this vaccine at local pharmacy or Health Dept. Aware to provide a copy of the vaccination record if obtained from local pharmacy or Health Dept. Verbalized acceptance and understanding.  Pneumococcal vaccine status: Up to date  Covid-19 vaccine status: Declined, Education has been provided regarding the importance of this vaccine but patient still declined. Advised may receive this vaccine at local pharmacy or Health Dept.or vaccine clinic. Aware to provide a copy of the vaccination record if obtained from local pharmacy or Health Dept. Verbalized acceptance and understanding.  Qualifies for Shingles Vaccine? No   Zostavax completed No   Shingrix Completed?: No.    Education has been provided regarding the importance of this vaccine. Patient has been advised to call insurance company to  determine out of pocket expense if they have not yet received this vaccine. Advised may also receive vaccine at local pharmacy or Health Dept. Verbalized acceptance and understanding.  Screening Tests Health Maintenance  Topic Date Due  . COVID-19 Vaccine (1) Never done  . OPHTHALMOLOGY EXAM  Never done  . TETANUS/TDAP  11/05/2018  . Hepatitis C Screening  12/23/2021 (Originally 03/22/1994)  . INFLUENZA VACCINE  05/05/2021  . HEMOGLOBIN A1C  06/25/2021  . FOOT EXAM  12/23/2021  . COLONOSCOPY (Pts 45-7yr Insurance coverage will need to be confirmed)  05/13/2030  . PNEUMOCOCCAL POLYSACCHARIDE VACCINE AGE 26-64 HIGH RISK  Completed  . HIV Screening  Completed  . HPV VACCINES  Aged Out    Health Maintenance  Health Maintenance Due  Topic Date Due  . COVID-19 Vaccine (1) Never done  . OPHTHALMOLOGY EXAM  Never done  . TETANUS/TDAP  11/05/2018    Colorectal cancer screening: No longer required.   Lung Cancer Screening: (Low Dose CT Chest recommended if Age 47-80years, 30 pack-year currently smoking OR have quit w/in 15years.) does not qualify.   Lung Cancer Screening Referral: n/a  Additional Screening:  Hepatitis C Screening: does not qualify  Vision Screening: Recommended annual ophthalmology exams for early detection of glaucoma and other disorders of the eye. Is the patient up to date with their annual eye exam?  {YES/NO:21197} Who is the provider or what is the name of the office in which the patient attends annual eye exams? *** If pt is not established with a provider, would they like to be referred to a provider to establish care? {YES/NO:21197}.   Dental Screening: Recommended annual dental exams for proper oral hygiene  Community Resource Referral / Chronic Care Management: CRR required this visit?  No   CCM required this visit?  No      Plan:     I have personally reviewed and noted the following in the patient's chart:   . Medical and social  history . Use of alcohol, tobacco or illicit drugs  . Current medications and supplements including opioid prescriptions. Patient is not currently taking opioid prescriptions. . Functional ability and status . Nutritional status . Physical activity . Advanced directives . List of other physicians . Hospitalizations, surgeries, and ER visits in previous 12 months . Vitals . Screenings to include cognitive, depression, and falls . Referrals and appointments  In addition, I have reviewed and discussed with patient certain preventive protocols, quality metrics, and best practice recommendations. A written personalized care plan for preventive services as well as general preventive health recommendations were provided to patient.     Randel Pigg, LPN   [id number]/56/[id number]   Nurse Notes: none

## 2021-02-20 ENCOUNTER — Other Ambulatory Visit: Payer: Self-pay | Admitting: Internal Medicine

## 2021-02-28 ENCOUNTER — Ambulatory Visit: Payer: Medicare Other | Admitting: Neurology

## 2021-02-28 NOTE — Progress Notes (Deleted)
Follow-up Visit   Date: 02/28/21   Todd Rodriguez MRN: [id number] DOB: [birth year]   Interim History: Todd Rodriguez is a 45 y.o. ***-handed Caucasian/*** male with *** returning to the clinic for follow-up of ***.  The patient was accompanied to the clinic by *** who also provides collateral information.    History of present illness: Starting around the fall of 2018, he began having numbness in both arms, worse on the left.  He was also having significant neck pain.  He did not have any benefit with physical therapy.  He has MRI cervical spine in March 2019 which showed severe central canal stenosis at C4-5 with myelomalacia, as well as severe biforaminal stenosis at this level and at C7-T1 and C3-4.  In April 2019, he underwent ACDF C4-5 and exploration of previous fusion at C5-6 by Dr.Brooks.   Following surgery, his neck pain improved, but there was no change in his arm and hand numbness/tingling.  He denies any weakness of the arms.  He drops object frequently. He had MRI cervical spine following his surgery, but I do not have this to review.  He also had NCS/EMG of the arms prior to surgery by Dr. Nelva Bush, the report is not available and will be requested.  Starting around 2 weeks ago, he began having numbness involving the feet and soles of the feet.  He drinks alcohol on the weekend 2-3 drinks (12oz beer), but upon review of epic notes, he was hospitalized in August for persistent nausea, vomiting due to starvation ketoacidosis from drinking vodka daily.  There was concern with the pancreatitis and was also found to have thrombocytopenia, secondary to alcohol abuse.  He is on disability since July 2020.  He was last working in September 2018 at [employer].   UPDATE ***:  Medications:  Current Outpatient Medications on File Prior to Visit  Medication Sig Dispense Refill  . albuterol (VENTOLIN HFA) 108 (90 Base) MCG/ACT inhaler TAKE 2 PUFFS BY MOUTH EVERY 6 HOURS AS NEEDED FOR  WHEEZE OR SHORTNESS OF BREATH (Patient taking differently: Inhale 2 puffs into the lungs every 6 (six) hours as needed for wheezing or shortness of breath.) 1 g 1  . amlodipine-benazepril (LOTREL) 2.5-10 MG capsule TAKE 1 CAPSULE BY MOUTH DAILY. FOR HIGH BLOOD PRESSURE .DOSE ADJUSTMENT 90 capsule 1  . Aspirin-Acetaminophen (GOODYS BODY PAIN PO) Take 1 Package by mouth daily as needed (headache/pain).    Marland Kitchen atorvastatin (LIPITOR) 40 MG tablet TAKE 1 TABLET BY MOUTH EVERY DAY (Patient taking differently: Take 40 mg by mouth daily.) 90 tablet 1  . Brimonidine Tartrate (LUMIFY) 0.025 % SOLN Place 1 drop into both eyes daily as needed (eye irritation/dry eyes.).    Marland Kitchen citalopram (CELEXA) 20 MG tablet Take 10 mg per day for 1-2 weeks then increase to 20 mg per day or as directed 90 tablet 1  . Cyanocobalamin (VITAMIN B-12 PO) Take 1 tablet by mouth daily.    Marland Kitchen dicyclomine (BENTYL) 10 MG capsule TAKE 1 CAPSULE FOUR TIMES A DAY BEFORE MEALS AND AT BEDTIME AS NEEDED FOR ABDOMINAL CRAMPING 360 capsule 0  . empagliflozin (JARDIANCE) 25 MG TABS tablet Take 25 mg by mouth daily. 30 tablet 6  . gabapentin (NEURONTIN) 300 MG capsule Take 300 mg by mouth 3 (three) times daily.     Marland Kitchen glucose blood (ONE TOUCH TEST STRIPS) test strip Use as instructed 100 each 11  . Insulin Glargine (BASAGLAR KWIKPEN) 100 UNIT/ML INJECT 0.15 MLS (15 UNITS TOTAL)  INTO THE SKIN DAILY. INCREASE AS DIRECTED 15 mL 1  . Insulin Pen Needle (B-D UF III MINI PEN NEEDLES) 31G X 5 MM MISC Use to inject basaglar daily 100 each 1  . lipase/protease/amylase (CREON) 36000 UNITS CPEP capsule Take two with meals and one with snacks (total of 8 per day) (Patient taking differently: Take 36,000-72,000 Units by mouth See admin instructions. Take two with meals and one with snacks (total of 8 per day)) 270 capsule 3  . LORazepam (ATIVAN) 1 MG tablet Take 1 tablet (1 mg total) by mouth every 8 (eight) hours as needed for anxiety. Panic attacks 20 tablet 0  .  metFORMIN (GLUCOPHAGE) 1000 MG tablet Take 1 tablet (1,000 mg total) by mouth 2 (two) times daily with a meal. 180 tablet 3  . ondansetron (ZOFRAN ODT) 4 MG disintegrating tablet Take 1 tablet (4 mg total) by mouth every 8 (eight) hours as needed for nausea or vomiting. 20 tablet 0  . pantoprazole (PROTONIX) 40 MG tablet Take 40 mg by mouth daily.     No current facility-administered medications on file prior to visit.    Allergies: No Known Allergies  Vital Signs:  There were no vitals taken for this visit.   General Medical Exam:   General:  Well appearing, comfortable  Eyes/ENT: see cranial nerve examination.   Neck:  No carotid bruits. Respiratory:  Clear to auscultation, good air entry bilaterally.   Cardiac:  Regular rate and rhythm, no murmur.   Ext:  No edema ***  Neurological Exam: MENTAL STATUS including orientation to time, place, person, recent and remote memory, attention span and concentration, language, and fund of knowledge is ***normal.  Speech is not dysarthric.  CRANIAL NERVES:  No visual field defects.  Pupils equal round and reactive to light.  Normal conjugate, extra-ocular eye movements in all directions of gaze.  No ptosis ***.  Face is symmetric. Palate elevates symmetrically.  Tongue is midline.  MOTOR:  Motor strength is 5/5 in all extremities, ***.  No atrophy, fasciculations or abnormal movements.  No pronator drift.  Tone is normal.    MSRs:  Reflexes are 2+/4 throughout ***.  SENSORY:  Intact to vibration throughout ***.  COORDINATION/GAIT:  Normal finger-to- nose-finger.  Intact rapid alternating movements bilaterally.  Gait narrow based and stable.   Data:*** MRI cervical spine 12/07/2017 performed at [facility]: 1.  Severe C4-5 central canal stenosis with focal myelomalacia seen spanning 12 mm of the cord in cranial caudal dimension centered at C4-5.  Severe right and moderate-severe left neuroforaminal and subarticular zone narrowing impinging  upon coursing and exiting C5 nerve roots more pronounced on the right. 2.  Severe left and moderate right C7-T1 subarticular zone narrowing and moderate-severe left and moderate right neuroforaminal narrowing impinging upon the coursing and exiting left C8 nerve roots 3.  Moderate-severe left and moderate right C3-4 neuroforaminal narrowing with moderate left subarticular zone narrowing impinging upon exiting left C4 nerve roots.  Mild-moderate central canal stenosis with mild flattening of the cord. 4.  Postsurgical changes at C5-6 without central canal stenosis.  Moderate bilateral subarticular zone narrowing without neural foraminal narrowing likely displacing and somewhat impinging upon exiting C6 nerve roots. 5.  Postsurgical changes at C6-7 without spinal canal stenosis.  Mild-moderate bilateral neuroforaminal narrowing and mild narrowing of the subarticular zones. 6.  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   Total time spent:  ***  Thank you for allowing me to participate in patient's care.  If I can answer any additional questions, I would be pleased to do so.    Sincerely,    Rosamae Rocque K. Posey Pronto, DO

## 2021-03-31 ENCOUNTER — Ambulatory Visit: Payer: Medicare Other | Admitting: Internal Medicine

## 2021-03-31 DIAGNOSIS — Z0289 Encounter for other administrative examinations: Secondary | ICD-10-CM

## 2021-03-31 NOTE — Progress Notes (Deleted)
No chief complaint on file.   HPI: Todd Rodriguez 45 y.o. come in for Chronic disease management  Last visit was 3 2022    ROS: See pertinent positives and negatives per HPI.  Past Medical History:  Diagnosis Date   Acute pancreatitis 10/25/2013   Arthritis    Diabetes mellitus without complication (HCC)    GERD (gastroesophageal reflux disease)    Hepatic steatosis    History of pancreatitis    Hypertension    Myalgia and myositis 04/21/2014   much better from illness presumed infectious    has disability form  will complete     Pancreatic pseudocyst     Family History  Problem Relation Age of Onset   Diabetes Mother    Hypertension Mother    Diabetes Father    Hypertension Father    Arthritis Maternal Grandmother    Hypertension Maternal Grandfather    Diabetes Maternal Grandfather    Colon cancer Neg Hx    Pancreatitis Neg Hx    Pancreatic cancer Neg Hx     Social History   Socioeconomic History   Marital status: Married    Spouse name: Not on file   Number of children: 1   Years of education: Not on file   Highest education level: Not on file  Occupational History    Employer: [employer]  Tobacco Use   Smoking status: Every Day    Packs/day: 1.00    Years: 12.00    Pack years: 12.00    Types: Cigarettes   Smokeless tobacco: Never  Vaping Use   Vaping Use: Never used  Substance and Sexual Activity   Alcohol use: Yes    Comment: every weekend, one drink. 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             Social Determinants of Health    Financial Resource Strain: Not on file  Food Insecurity: Not on file  Transportation Needs: Not on file  Physical Activity: Not on file  Stress: Not on file  Social Connections: Not on file    Outpatient Medications Prior to Visit  Medication Sig Dispense Refill   albuterol (VENTOLIN HFA) 108 (90 Base) MCG/ACT inhaler TAKE 2 PUFFS BY MOUTH EVERY 6 HOURS AS NEEDED FOR WHEEZE OR SHORTNESS OF BREATH (Patient taking differently: Inhale 2 puffs into the lungs every 6 (six) hours as needed for wheezing or shortness of breath.) 1 g 1   amlodipine-benazepril (LOTREL) 2.5-10 MG capsule TAKE 1 CAPSULE BY MOUTH DAILY. FOR HIGH BLOOD PRESSURE .DOSE ADJUSTMENT 90 capsule 1   Aspirin-Acetaminophen (GOODYS BODY PAIN PO) Take 1 Package by mouth daily as needed (headache/pain).     atorvastatin (LIPITOR) 40 MG tablet TAKE 1 TABLET BY MOUTH EVERY DAY (Patient taking differently: Take 40 mg by mouth daily.) 90 tablet 1   Brimonidine Tartrate (LUMIFY) 0.025 % SOLN Place 1 drop into both eyes daily as needed (eye irritation/dry eyes.).  citalopram (CELEXA) 20 MG tablet Take 10 mg per day for 1-2 weeks then increase to 20 mg per day or as directed 90 tablet 1   Cyanocobalamin (VITAMIN B-12 PO) Take 1 tablet by mouth daily.     dicyclomine (BENTYL) 10 MG capsule TAKE 1 CAPSULE FOUR TIMES A DAY BEFORE MEALS AND AT BEDTIME AS NEEDED FOR ABDOMINAL CRAMPING 360 capsule 0   empagliflozin (JARDIANCE) 25 MG TABS tablet Take 25 mg by mouth daily. 30 tablet 6   gabapentin (NEURONTIN) 300 MG capsule Take 300 mg by mouth 3 (three) times daily.      glucose blood (ONE TOUCH TEST STRIPS) test strip Use as instructed 100 each 11   Insulin Glargine (BASAGLAR KWIKPEN) 100 UNIT/ML INJECT 0.15 MLS (15 UNITS TOTAL) INTO THE SKIN DAILY. INCREASE AS DIRECTED 15 mL 1   Insulin Pen Needle (B-D UF III MINI PEN NEEDLES) 31G X 5 MM MISC Use to inject basaglar daily 100 each 1   lipase/protease/amylase (CREON) 36000 UNITS CPEP capsule  Take two with meals and one with snacks (total of 8 per day) (Patient taking differently: Take 36,000-72,000 Units by mouth See admin instructions. Take two with meals and one with snacks (total of 8 per day)) 270 capsule 3   LORazepam (ATIVAN) 1 MG tablet Take 1 tablet (1 mg total) by mouth every 8 (eight) hours as needed for anxiety. Panic attacks 20 tablet 0   metFORMIN (GLUCOPHAGE) 1000 MG tablet Take 1 tablet (1,000 mg total) by mouth 2 (two) times daily with a meal. 180 tablet 3   ondansetron (ZOFRAN ODT) 4 MG disintegrating tablet Take 1 tablet (4 mg total) by mouth every 8 (eight) hours as needed for nausea or vomiting. 20 tablet 0   pantoprazole (PROTONIX) 40 MG tablet Take 40 mg by mouth daily.     No facility-administered medications prior to visit.     EXAM:  There were no vitals taken for this visit.  There is no height or weight on file to calculate BMI. Wt Readings from Last 3 Encounters:  12/23/20 153 lb 9.6 oz (69.7 kg)  12/23/20 156 lb 3.2 oz (70.9 kg)  08/27/20 150 lb 9.6 oz (68.3 kg)    GENERAL: vitals reviewed and listed above, alert, oriented, appears well hydrated and in no acute distress HEENT: atraumatic, conjunctiva  clear, no obvious abnormalities on inspection of external nose and ears OP :  NECK: no obvious masses on inspection palpation  LUNGS: clear to auscultation bilaterally, no wheezes, rales or rhonchi, good air movement CV: HRRR, no clubbing cyanosis or  peripheral edema nl cap refill  MS: moves all extremities without noticeable focal  abnormality PSYCH: pleasant and cooperative, no obvious depression or anxiety Lab Results  Component Value Date   WBC 3.1 (L) 11/25/2019   HGB 14.1 11/25/2019   HCT 42.8 11/25/2019   PLT 205 11/25/2019   GLUCOSE 194 (H) 12/23/2020   CHOL 160 12/23/2020   TRIG 80.0 12/23/2020   HDL 69.60 12/23/2020   LDLDIRECT 193.0 03/21/2019   LDLCALC 74 12/23/2020   ALT 23 12/23/2020   AST 23 12/23/2020   NA 136  12/23/2020   K 3.3 (L) 12/23/2020   CL 94 (L) 12/23/2020   CREATININE 0.64 12/23/2020   BUN 3 (L) 12/23/2020   CO2 32 12/23/2020   TSH 1.65 12/23/2020   PSA 1.17 12/23/2020   HGBA1C 9.6 (H) 12/23/2020   MICROALBUR <0.7 10/18/2019   BP Readings from Last 3 Encounters:  12/23/20  112/82  12/23/20 140/86  09/23/20 100/70    ASSESSMENT AND PLAN:  Discussed the following assessment and plan:  Uncontrolled type 2 diabetes mellitus with hyperglycemia (HCC)  Medication management  Insomnia, unspecified type  Vitamin B12 deficiency ? Hep b bvaccine? -Patient advised to return or notify health care team  if  new concerns arise.  There are no Patient Instructions on file for this visit.   Standley Brooking. Zi Newbury M.D.

## 2021-04-08 ENCOUNTER — Telehealth: Payer: Self-pay | Admitting: Internal Medicine

## 2021-04-08 NOTE — Telephone Encounter (Signed)
Left message for patient to call back and schedule Medicare Annual Wellness Visit (AWV) either virtually or in office.    awvi 12/03/20 per [facility]  please schedule at anytime with [facility] Nurse Health Advisor 1 or 2   This should be a 45 minute visit.

## 2021-04-29 ENCOUNTER — Other Ambulatory Visit: Payer: Self-pay

## 2021-04-30 ENCOUNTER — Ambulatory Visit: Payer: Medicare Other | Admitting: Internal Medicine

## 2021-04-30 ENCOUNTER — Encounter: Payer: Self-pay | Admitting: Internal Medicine

## 2021-04-30 VITALS — BP 116/70 | HR 118 | Temp 98.5°F | Ht 74.0 in | Wt 152.8 lb

## 2021-04-30 DIAGNOSIS — R634 Abnormal weight loss: Secondary | ICD-10-CM

## 2021-04-30 DIAGNOSIS — R2689 Other abnormalities of gait and mobility: Secondary | ICD-10-CM

## 2021-04-30 DIAGNOSIS — I10 Essential (primary) hypertension: Secondary | ICD-10-CM | POA: Diagnosis not present

## 2021-04-30 DIAGNOSIS — Z9181 History of falling: Secondary | ICD-10-CM

## 2021-04-30 DIAGNOSIS — Z0001 Encounter for general adult medical examination with abnormal findings: Secondary | ICD-10-CM

## 2021-04-30 DIAGNOSIS — R609 Edema, unspecified: Secondary | ICD-10-CM

## 2021-04-30 DIAGNOSIS — E1165 Type 2 diabetes mellitus with hyperglycemia: Secondary | ICD-10-CM | POA: Diagnosis not present

## 2021-04-30 DIAGNOSIS — Z79899 Other long term (current) drug therapy: Secondary | ICD-10-CM

## 2021-04-30 DIAGNOSIS — F1010 Alcohol abuse, uncomplicated: Secondary | ICD-10-CM

## 2021-04-30 LAB — CBC WITH DIFFERENTIAL/PLATELET
Basophils Absolute: 0.1 10*3/uL (ref 0.0–0.1)
Basophils Relative: 1.8 % (ref 0.0–3.0)
Eosinophils Absolute: 0.1 10*3/uL (ref 0.0–0.7)
Eosinophils Relative: 1.7 % (ref 0.0–5.0)
HCT: 31 % — ABNORMAL LOW (ref 39.0–52.0)
Hemoglobin: 10.3 g/dL — ABNORMAL LOW (ref 13.0–17.0)
Lymphocytes Relative: 27.8 % (ref 12.0–46.0)
Lymphs Abs: 1.2 10*3/uL (ref 0.7–4.0)
MCHC: 33.1 g/dL (ref 30.0–36.0)
MCV: 97.5 fl (ref 78.0–100.0)
Monocytes Absolute: 0.7 10*3/uL (ref 0.1–1.0)
Monocytes Relative: 16.6 % — ABNORMAL HIGH (ref 3.0–12.0)
Neutro Abs: 2.3 10*3/uL (ref 1.4–7.7)
Neutrophils Relative %: 52.1 % (ref 43.0–77.0)
Platelets: 335 10*3/uL (ref 150.0–400.0)
RBC: 3.18 Mil/uL — ABNORMAL LOW (ref 4.22–5.81)
RDW: 13.6 % (ref 11.5–15.5)
WBC: 4.3 10*3/uL (ref 4.0–10.5)

## 2021-04-30 LAB — PROTIME-INR
INR: 1.2 ratio — ABNORMAL HIGH (ref 0.8–1.0)
Prothrombin Time: 13 s (ref 9.6–13.1)

## 2021-04-30 LAB — HEPATIC FUNCTION PANEL
ALT: 12 U/L (ref 0–53)
AST: 11 U/L (ref 0–37)
Albumin: 3.4 g/dL — ABNORMAL LOW (ref 3.5–5.2)
Alkaline Phosphatase: 106 U/L (ref 39–117)
Bilirubin, Direct: 0.1 mg/dL (ref 0.0–0.3)
Total Bilirubin: 0.4 mg/dL (ref 0.2–1.2)
Total Protein: 5.5 g/dL — ABNORMAL LOW (ref 6.0–8.3)

## 2021-04-30 LAB — HEMOGLOBIN A1C: Hgb A1c MFr Bld: 10.9 % — ABNORMAL HIGH (ref 4.6–6.5)

## 2021-04-30 LAB — BASIC METABOLIC PANEL
BUN: 3 mg/dL — ABNORMAL LOW (ref 6–23)
CO2: 32 mEq/L (ref 19–32)
Calcium: 8.4 mg/dL (ref 8.4–10.5)
Chloride: 98 mEq/L (ref 96–112)
Creatinine, Ser: 0.42 mg/dL (ref 0.40–1.50)
GFR: 128.39 mL/min (ref >60.00)
Glucose, Bld: 155 mg/dL — ABNORMAL HIGH (ref 70–99)
Potassium: 3.9 mEq/L (ref 3.5–5.1)
Sodium: 137 mEq/L (ref 135–145)

## 2021-04-30 LAB — MAGNESIUM: Magnesium: 1.7 mg/dL (ref 1.5–2.5)

## 2021-04-30 LAB — TSH: TSH: 1.68 u[IU]/mL (ref 0.35–5.50)

## 2021-04-30 NOTE — Patient Instructions (Signed)
Will notify you  of labs when available.   Check blood sugar at least twice a day.   Stay on same meds for now.  Including b12  folate   NO alcohol at this time.   We may need  referral  to specialty care  depending.    Your labs show that you were very sick at the time of the hospital visit    . Diabetes needs to have better control and  work on nutrition.  Plan fu depending    or at least 2-3 months  .

## 2021-04-30 NOTE — Progress Notes (Signed)
Chief Complaint  Patient presents with   Follow-up    HPI: Todd Rodriguez 45 y.o. come in for fu hospital visit evaluation and admission to [facility] admitted 71/7 discharge 720 he has some patient paperwork with him. He states he was admitted because he fell and could not get up at about 5 AM.  EMS took him to the local hospital and he was diagnosed as DKA with significant metabolic abnormalities that included a low potassium low magnesium  Final diagnosis was DKA alcoholism a low magnesium and low potassium.  Medications to start and discharged with folic acid thiamine potassium chloride and magnesium oxide He was told to take his insulin Tresiba 15 in the morning and 20 in the evening and given a sliding scale for NovoLog.  He states since that time he has held the dose of the insulin when his blood sugar was "low" he reports that is 141.  He states that his blood sugars been better since he is gotten out of the hospital.  He states that his right shoulder got hurt and it is harder to lift when he fell. He denies daily alcohol and did not have alcohol that day he reports.  Since his hospital discharge he states he has had puffy edema in his feet but no chest pain or shortness of breath.  No excess bleeding.  He requests a handicap tag because it is too hard for him to walk without falling very far.   Further review of partial records from the ED.  Sodium 128 Kos 493 CO2 15.9 SGOT 214 PT 73 alk phos 256 blood alcohol 0.030% beta hydroxybutyrate 8.85 pancytopenia with 3.238-lead 69,000 INR was 1 was treated with insulin saline Rocephin bicarbonate Zithromax.  Blood sugar came down to 113 CO2 16 anion gap 17 do not have further follow-up labs. ROS: See pertinent positives and negatives per HPI.  Past Medical History:  Diagnosis Date   Acute pancreatitis 10/25/2013   Arthritis    Diabetes mellitus without complication (HCC)    GERD (gastroesophageal reflux disease)    Hepatic  steatosis    History of pancreatitis    Hypertension    Myalgia and myositis 04/21/2014   much better from illness presumed infectious    has disability form  will complete     Pancreatic pseudocyst     Family History  Problem Relation Age of Onset   Diabetes Mother    Hypertension Mother    Diabetes Father    Hypertension Father    Arthritis Maternal Grandmother    Hypertension Maternal Grandfather    Diabetes Maternal Grandfather    Colon cancer Neg Hx    Pancreatitis Neg Hx    Pancreatic cancer Neg Hx     Social History   Socioeconomic History   Marital status: Married    Spouse name: Not on file   Number of children: 1   Years of education: Not on file   Highest education level: Not on file  Occupational History    Employer: [employer]  Tobacco Use   Smoking status: Every Day    Packs/day: 1.00    Years: 12.00    Pack years: 12.00    Types: Cigarettes   Smokeless tobacco: Never  Vaping Use   Vaping Use: Never used  Substance and Sexual Activity   Alcohol use: Yes    Comment: every weekend, one drink. 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             Social Determinants of Health   Financial Resource Strain: Not on file  Food Insecurity: Not on file  Transportation Needs: Not on file  Physical Activity: Not on file  Stress: Not on file  Social Connections: Not on file    Outpatient Medications Prior to Visit  Medication Sig Dispense Refill   albuterol (VENTOLIN HFA) 108 (90 Base) MCG/ACT inhaler TAKE 2 PUFFS BY MOUTH EVERY 6 HOURS AS NEEDED FOR  WHEEZE OR SHORTNESS OF BREATH (Patient taking differently: Inhale 2 puffs into the lungs every 6 (six) hours as needed for wheezing or shortness of breath.) 1 g 1   amlodipine-benazepril (LOTREL) 2.5-10 MG capsule TAKE 1 CAPSULE BY MOUTH DAILY. FOR HIGH BLOOD PRESSURE .DOSE ADJUSTMENT 90 capsule 1   Aspirin-Acetaminophen (GOODYS BODY PAIN PO) Take 1 Package by mouth daily as needed (headache/pain).     atorvastatin (LIPITOR) 40 MG tablet TAKE 1 TABLET BY MOUTH EVERY DAY (Patient taking differently: Take 40 mg by mouth daily.) 90 tablet 1   Brimonidine Tartrate (LUMIFY) 0.025 % SOLN Place 1 drop into both eyes daily as needed (eye irritation/dry eyes.).     citalopram (CELEXA) 20 MG tablet Take 10 mg per day for 1-2 weeks then increase to 20 mg per day or as directed 90 tablet 1   Cyanocobalamin (VITAMIN B-12 PO) Take 1 tablet by mouth daily.     dicyclomine (BENTYL) 10 MG capsule TAKE 1 CAPSULE FOUR TIMES A DAY BEFORE MEALS AND AT BEDTIME AS NEEDED FOR ABDOMINAL CRAMPING 360 capsule 0   empagliflozin (JARDIANCE) 25 MG TABS tablet Take 25 mg by mouth daily. 30 tablet 6   folic acid (FOLVITE) 158 MCG tablet Take 800 mcg by mouth daily.     gabapentin (NEURONTIN) 300 MG capsule Take 300 mg by mouth 3 (three) times daily.      glucose blood (ONE TOUCH TEST STRIPS) test strip Use as instructed 100 each 11   Insulin Pen Needle (B-D UF III MINI PEN NEEDLES) 31G X 5 MM MISC Use to inject basaglar daily 100 each 1   KLOR-CON M20 20 MEQ tablet Take 40 mEq by mouth 2 (two) times daily.     lipase/protease/amylase (CREON) 36000 UNITS CPEP capsule Take two with meals and one with snacks (total of 8 per day) (Patient taking differently: Take 36,000-72,000 Units by mouth See admin instructions. Take two with meals and one with snacks (total of 8 per day)) 270 capsule 3   LORazepam (ATIVAN) 1 MG tablet Take 1 tablet (1 mg total) by mouth every 8 (eight) hours as needed for anxiety. Panic attacks 20 tablet 0    magnesium oxide (MAG-OX) 400 (240 Mg) MG tablet Take 1 tablet by mouth daily.     ondansetron (ZOFRAN ODT) 4 MG disintegrating tablet Take 1 tablet (4 mg total) by mouth every 8 (eight) hours as needed for nausea or vomiting. 20 tablet 0   pantoprazole (PROTONIX) 40 MG tablet Take  40 mg by mouth daily.     thiamine 100 MG tablet Take 100 mg by mouth daily.     TRESIBA FLEXTOUCH 100 UNIT/ML FlexTouch Pen Inject into the skin.     Insulin Glargine (BASAGLAR KWIKPEN) 100 UNIT/ML INJECT 0.15 MLS (15 UNITS TOTAL) INTO THE SKIN DAILY. INCREASE AS DIRECTED 15 mL 1   metFORMIN (GLUCOPHAGE) 1000 MG tablet Take 1 tablet (1,000 mg total) by mouth 2 (two) times daily with a meal. 180 tablet 3   No facility-administered medications prior to visit.     EXAM:  BP 116/70 (BP Location: Left Arm, Patient Position: Sitting, Cuff Size: Normal)   Pulse (!) 118   Temp 98.5 F (36.9 C) (Oral)   Ht _0  (1.88 m)   Wt 152 lb 12.8 oz (69.3 kg)   SpO2 99%   BMI 19.62 kg/m   Body mass index is 19.62 kg/m.  GENERAL: vitals reviewed and listed above, alert, oriented, appears well hydrated but now weight loss with chronically ill and in no acute distress he is nonicteric HEENT: atraumatic, conjunctiva  clear, no obvious abnormalities on inspection of external nose and ears  NECK: no obvious masses on inspection palpation  LUNGS: clear to auscultation bilaterally, no wheezes, rales or rhonchi, good air movement CV: HRRR, no clubbing cyanosis or feet and ankles have +2 to +3 edema no ulcers lesions sensation intact pulses are full Abdomen no obvious masses or tenderness.  No obvious ascites. MS: moves all extremities without noticeable focal  abnormality right shoulder arm says it has hurt to do full range of motion  PSYCH: Oriented normal speech Lab Results  Component Value Date   WBC 4.3 04/30/2021   HGB 10.3 (L) 04/30/2021   HCT 31.0 (L) 04/30/2021   PLT 335.0 04/30/2021   GLUCOSE 155 (H) 04/30/2021    CHOL 160 12/23/2020   TRIG 80.0 12/23/2020   HDL 69.60 12/23/2020   LDLDIRECT 193.0 03/21/2019   LDLCALC 74 12/23/2020   ALT 12 04/30/2021   AST 11 04/30/2021   NA 137 04/30/2021   K 3.9 04/30/2021   CL 98 04/30/2021   CREATININE 0.42 04/30/2021   BUN 3 (L) 04/30/2021   CO2 32 04/30/2021   TSH 1.68 04/30/2021   PSA 1.17 12/23/2020   INR 1.2 (H) 04/30/2021   HGBA1C 10.9 (H) 04/30/2021   MICROALBUR <0.7 10/18/2019   BP Readings from Last 3 Encounters:  04/30/21 116/70  12/23/20 112/82  12/23/20 140/86    ASSESSMENT AND PLAN:  Discussed the following assessment and plan:  Uncontrolled type 2 diabetes mellitus with hyperglycemia ([place]) - Plan: Basic metabolic panel, CBC with Differential/Platelet, Hepatic function panel, TSH, Hemoglobin A1c, Magnesium, Protime-INR, Protime-INR, Magnesium, Hemoglobin A1c, TSH, Hepatic function panel, CBC with Differential/Platelet, Basic metabolic panel, Basic Metabolic Panel  Essential hypertension, benign - Plan: Basic metabolic panel, CBC with Differential/Platelet, Hepatic function panel, TSH, Hemoglobin A1c, Magnesium, Protime-INR, Protime-INR, Magnesium, Hemoglobin A1c, TSH, Hepatic function panel, CBC with Differential/Platelet, Basic metabolic panel, Basic Metabolic Panel  Weight loss - Plan: Basic metabolic panel, CBC with Differential/Platelet, Hepatic function panel, TSH, Hemoglobin A1c, Magnesium, Protime-INR, Protime-INR, Magnesium, Hemoglobin A1c, TSH, Hepatic function panel, CBC with Differential/Platelet, Basic metabolic panel  History of bad fall - Plan: Basic metabolic panel, CBC with Differential/Platelet, Hepatic function panel, TSH, Hemoglobin A1c, Magnesium, Protime-INR, Protime-INR, Magnesium, Hemoglobin A1c, TSH, Hepatic function panel, CBC with Differential/Platelet, Basic metabolic panel  Edema, unspecified type - check albumin no  cv sx or findings - Plan: Basic  metabolic panel, CBC with Differential/Platelet, Hepatic  function panel, TSH, Hemoglobin A1c, Magnesium, Protime-INR, Protime-INR, Magnesium, Hemoglobin A1c, TSH, Hepatic function panel, CBC with Differential/Platelet, Basic metabolic panel  Balance problem - multifactorial  - Plan: Basic metabolic panel, CBC with Differential/Platelet, Hepatic function panel, TSH, Hemoglobin A1c, Magnesium, Protime-INR, Protime-INR, Magnesium, Hemoglobin A1c, TSH, Hepatic function panel, CBC with Differential/Platelet, Basic metabolic panel  Medication management - Plan: Basic Metabolic Panel  Encounter for preventative adult health care exam with abnormal findings - Plan: Basic Metabolic Panel  Alcohol abuse Diabetes is obviously been terribly out of control although patient reports that's may not be accurate He is holding his insulin when his blood sugar is in better control which is not helpful. Ported that his blood sugar metabolic findings when he went to the hospital were life-threatening. He is more worried about his balance problem which could be related to metabolic he states that he does not drink much alcohol but suspect nutritional deficiencies could be related handicap application given for 6 months. Updated labs today and will review. He needs to take the twice daily insulin monitor.  He was holding the insulin because of "low"  140 range .  Tried to refer him to endocrinology a number of times but something has always fallen through ,as he lives in [place]. Is to continue the vitamins as described and contact us for refills when due.  We will plan follow-up depending on results of lab work but 1-2 months  -Patient advised to return or notify health care team  if  new concerns arise. Total time review of paperwork records discussion evaluation 60 minutes. Patient Instructions  Will notify you  of labs when available.   Check blood sugar at least twice a day.   Stay on same meds for now.  Including b12  folate   NO alcohol at this time.   We may  need  referral  to specialty care  depending.    Your labs show that you were very sick at the time of the hospital visit    . Diabetes needs to have better control and  work on nutrition.  Plan fu depending    or at least 2-3 months  .   Standley Brooking. Eloyce Bultman M.D.

## 2021-05-12 ENCOUNTER — Other Ambulatory Visit: Payer: Self-pay | Admitting: Internal Medicine

## 2021-05-15 ENCOUNTER — Encounter: Payer: Self-pay | Admitting: Gastroenterology

## 2021-05-19 ENCOUNTER — Other Ambulatory Visit: Payer: Self-pay

## 2021-05-19 ENCOUNTER — Ambulatory Visit: Payer: Medicare Other | Admitting: Internal Medicine

## 2021-05-19 ENCOUNTER — Encounter: Payer: Self-pay | Admitting: Internal Medicine

## 2021-05-19 ENCOUNTER — Ambulatory Visit (INDEPENDENT_AMBULATORY_CARE_PROVIDER_SITE_OTHER): Payer: Medicare Other | Admitting: Internal Medicine

## 2021-05-19 VITALS — BP 128/80 | HR 66 | Ht 74.0 in | Wt 146.6 lb

## 2021-05-19 DIAGNOSIS — E1165 Type 2 diabetes mellitus with hyperglycemia: Secondary | ICD-10-CM | POA: Diagnosis not present

## 2021-05-19 DIAGNOSIS — Z794 Long term (current) use of insulin: Secondary | ICD-10-CM

## 2021-05-19 MED ORDER — NOVOLOG FLEXPEN 100 UNIT/ML ~~LOC~~ SOPN: 6.0000 [IU] | PEN_INJECTOR | Freq: Three times a day (TID) | SUBCUTANEOUS | 6 refills | Status: DC

## 2021-05-19 MED ORDER — TRESIBA FLEXTOUCH 100 UNIT/ML ~~LOC~~ SOPN: 18.0000 [IU] | PEN_INJECTOR | Freq: Every day | SUBCUTANEOUS | 6 refills | Status: DC

## 2021-05-19 MED ORDER — DEXCOM G6 SENSOR MISC: 1.0000 | 3 refills | Status: DC

## 2021-05-19 MED ORDER — INSULIN PEN NEEDLE 32G X 4 MM MISC: 1.0000 | Freq: Four times a day (QID) | 3 refills | Status: DC

## 2021-05-19 MED ORDER — EMPAGLIFLOZIN 25 MG PO TABS: 25.0000 mg | ORAL_TABLET | Freq: Every day | ORAL | 3 refills | Status: DC

## 2021-05-19 MED ORDER — DEXCOM G6 TRANSMITTER MISC: 1.0000 | 3 refills | Status: DC

## 2021-05-19 NOTE — Patient Instructions (Addendum)
-   Change Tresiba to 18 units ONCE DAILY  - Start Novolog 6 units with each meal  - Continue Jardiance 25 mg , 1 tablet daily     HOW TO TREAT LOW BLOOD SUGARS (Blood sugar LESS THAN 70 MG/DL) Please follow the RULE OF 15 for the treatment of hypoglycemia treatment (when your (blood sugars are less than 70 mg/dL)   STEP 1: Take 15 grams of carbohydrates when your blood sugar is low, which includes:  3-4 GLUCOSE TABS  OR 3-4 OZ OF JUICE OR REGULAR SODA OR ONE TUBE OF GLUCOSE GEL    STEP 2: RECHECK blood sugar in 15 MINUTES STEP 3: If your blood sugar is still low at the 15 minute recheck --> then, go back to STEP 1 and treat AGAIN with another 15 grams of carbohydrates.

## 2021-05-19 NOTE — Progress Notes (Signed)
Name: Todd Rodriguez  Age/ Sex: 45 y.o., male   MRN/ DOB: [id number], March 30, 1976     PCP: Burnis Medin, MD   Reason for Endocrinology Evaluation: Type 2 Diabetes Mellitus  Initial Endocrine Consultative Visit: 05/10/2019    PATIENT IDENTIFIER: Todd Rodriguez is a 45 y.o. male with a past medical history of DM, Hx of ETOH abuse, and depression . The patient has followed with Endocrinology clinic since 05/10/2019 for consultative assistance with management of his diabetes.  DIABETIC HISTORY:  Todd Rodriguez was diagnosed with DM many years ago. His hemoglobin A1c has ranged from 7.3% in 2015, peaking at 12.2% in 2017.   On his initial visit to our clinic he had an A1c of 9.5% he was on Metformin, Jardiance, and Glimepiride, we switched glimepiride to Glipizide and continued Metformin and Jardiance, he was lost to follow up for 2 years until his return to our clinic 05/2021 . He had an A1c of 10.9 % by then and was on [place] and tresiba .      SUBJECTIVE:   During the last visit (2020): A1c 9.5% switched Glimepiride to Glipizide and continued Jardiance and Metformin   Today (05/19/2021): Todd Rodriguez  is here for a follow up on diabetes. He has NOT been to our clinic in 2 years. He checks his blood sugars 2 times daily. He did not bing his meter today. The patient has not had hypoglycemic episodes since the last clinic visit.  Has hx of pancreatitis  He drinks sugar- sweetened beverages     HOME DIABETES REGIMEN:  Jardiance 25 mg daily  Tresiba 15 units QAM and 20 QPM     Statin: yes ACE-I/ARB: yes    METER DOWNLOAD SUMMARY: Did not bring     DIABETIC COMPLICATIONS: Microvascular complications:   Denies: retinopathy, CKD  Last Eye Exam: Completed 01/2021  Macrovascular complications:   Denies: CAD, CVA, PVD   HISTORY:  Past Medical History:  Past Medical History:  Diagnosis Date   Acute pancreatitis 10/25/2013   Arthritis    Diabetes mellitus without  complication (HCC)    GERD (gastroesophageal reflux disease)    Hepatic steatosis    History of pancreatitis    Hypertension    Myalgia and myositis 04/21/2014   much better from illness presumed infectious    has disability form  will complete     Pancreatic pseudocyst    Past Surgical History:  Past Surgical History:  Procedure Laterality Date   ANTERIOR CERVICAL DECOMP/DISCECTOMY FUSION N/A 01/12/2018   Procedure: Revision ACDF C4-5, removal of hardware C5-6, exploration of fusion C5-6;  Surgeon: [name], MD;  Location: [place];  Service: Orthopedics;  Laterality: N/A;  3.5 hrs   BIOPSY  05/13/2020   Procedure: BIOPSY;  Surgeon: Eloise Harman, DO;  [facility];  Service: Endoscopy;;   BIOPSY  09/23/2020   Procedure: BIOPSY;  Surgeon: Eloise Harman, DO;  [facility];  Service: Endoscopy;;   COLONOSCOPY WITH PROPOFOL N/A 05/13/2020   Dr. Abbey Chatters: Nonbleeding internal hemorrhoids.  Few small mouth diverticula in the sigmoid colon.  2 cecal polyps, inflammatory.  Sigmoid colon polyp hyperplastic.  Localized area of granular mucosa in the transverse colon, status post biopsy which was unremarkable.  No microscopic colitis.  Plans for repeat colonoscopy in 5 years.   ESOPHAGEAL BRUSHING  05/13/2020   Procedure: ESOPHAGEAL BRUSHING;  Surgeon: Eloise Harman, DO;  [facility];  Service: Endoscopy;;  mid esophagus  ESOPHAGOGASTRODUODENOSCOPY (EGD) WITH PROPOFOL N/A 05/13/2020   Dr. Abbey Chatters: Moderately severe esophagitis with no bleeding found in the middle third of the esophagus, suspicious for Candida esophagitis.  KOH was negative. Gastritis, duodenal polyp.  Duodenal biopsy benign.  Duodenal bulb biopsy with Brunner's gland hyperplasia and mild changes suggestive of peptic injury.  No celiac disease seen.  GE junction biopsy showed GERD with mild inflammation consisten   ESOPHAGOGASTRODUODENOSCOPY (EGD) WITH PROPOFOL N/A 09/23/2020   Procedure:  ESOPHAGOGASTRODUODENOSCOPY (EGD) WITH PROPOFOL;  Surgeon: Eloise Harman, DO;  [facility];  Service: Endoscopy;  Laterality: N/A;  7:30am   NECK SURGERY     2 disc   POLYPECTOMY  05/13/2020   Procedure: POLYPECTOMY;  Surgeon: Eloise Harman, DO;  [facility];  Service: Endoscopy;;   SPINAL FUSION     Social History:  reports that he has been smoking cigarettes. He has a 12.00 pack-year smoking history. He has never used smokeless tobacco. He reports current alcohol use. He reports that he does not use drugs. Family History:  Family History  Problem Relation Age of Onset   Diabetes Mother    Hypertension Mother    Diabetes Father    Hypertension Father    Arthritis Maternal Grandmother    Hypertension Maternal Grandfather    Diabetes Maternal Grandfather    Colon cancer Neg Hx    Pancreatitis Neg Hx    Pancreatic cancer Neg Hx      HOME MEDICATIONS: Allergies as of 05/19/2021   No Known Allergies      Medication List        Accurate as of May 19, 2021  7:40 AM. If you have any questions, ask your nurse or doctor.          albuterol 108 (90 Base) MCG/ACT inhaler Commonly known as: VENTOLIN HFA TAKE 2 PUFFS BY MOUTH EVERY 6 HOURS AS NEEDED FOR WHEEZE OR SHORTNESS OF BREATH What changed: See the new instructions.   amlodipine-benazepril 2.5-10 MG capsule Commonly known as: LOTREL TAKE 1 CAPSULE BY MOUTH DAILY. FOR HIGH BLOOD PRESSURE .DOSE ADJUSTMENT   atorvastatin 40 MG tablet Commonly known as: LIPITOR TAKE 1 TABLET BY MOUTH EVERY DAY   B-D UF III MINI PEN NEEDLES 31G X 5 MM Misc Generic drug: Insulin Pen Needle Use to inject basaglar daily   citalopram 20 MG tablet Commonly known as: CELEXA Take 10 mg per day for 1-2 weeks then increase to 20 mg per day or as directed   dicyclomine 10 MG capsule Commonly known as: BENTYL TAKE 1 CAPSULE FOUR TIMES A DAY BEFORE MEALS AND AT BEDTIME AS NEEDED FOR ABDOMINAL CRAMPING    empagliflozin 25 MG Tabs tablet Commonly known as: JARDIANCE Take 25 mg by mouth daily.   folic acid 295 MCG tablet Commonly known as: FOLVITE Take 800 mcg by mouth daily.   gabapentin 300 MG capsule Commonly known as: NEURONTIN Take 300 mg by mouth 3 (three) times daily.   glucose blood test strip Commonly known as: ONE TOUCH TEST STRIPS Use as instructed   GOODYS BODY PAIN PO Take 1 Package by mouth daily as needed (headache/pain).   Klor-Con M20 20 MEQ tablet Generic drug: potassium chloride SA Take 40 mEq by mouth 2 (two) times daily.   lipase/protease/amylase 36000 UNITS Cpep capsule Commonly known as: Creon Take two with meals and one with snacks (total of 8 per day) What changed:  how much to take how to take this when to  take this   LORazepam 1 MG tablet Commonly known as: Ativan Take 1 tablet (1 mg total) by mouth every 8 (eight) hours as needed for anxiety. Panic attacks   Lumify 0.025 % Soln Generic drug: Brimonidine Tartrate Place 1 drop into both eyes daily as needed (eye irritation/dry eyes.).   magnesium oxide 400 (240 Mg) MG tablet Commonly known as: MAG-OX Take 1 tablet by mouth daily.   ondansetron 4 MG disintegrating tablet Commonly known as: Zofran ODT Take 1 tablet (4 mg total) by mouth every 8 (eight) hours as needed for nausea or vomiting.   pantoprazole 40 MG tablet Commonly known as: PROTONIX Take 1 tablet (40 mg total) by mouth daily.   thiamine 100 MG tablet Take 100 mg by mouth daily.   Tyler Aas FlexTouch 100 UNIT/ML FlexTouch Pen Generic drug: insulin degludec Inject into the skin.   VITAMIN B-12 PO Take 1 tablet by mouth daily.         OBJECTIVE:   Vital Signs: BP 128/80 (BP Location: Left Arm, Patient Position: Sitting, Cuff Size: Normal)   Pulse 66   Ht 6' 2"  (1.88 m)   Wt 146 lb 9.6 oz (66.5 kg)   SpO2 94%   BMI 18.82 kg/m   Wt Readings from Last 3 Encounters:  04/30/21 152 lb 12.8 oz (69.3 kg)  12/23/20  153 lb 9.6 oz (69.7 kg)  12/23/20 156 lb 3.2 oz (70.9 kg)     Exam: General: Pt appears well and is in NAD  Neck: General: Supple without adenopathy. Thyroid: Thyroid size normal.  No goiter or nodules appreciated.  Lungs: Clear with good BS bilat with no rales, rhonchi, or wheezes  Heart: RRR with normal S1 and S2 and no gallops; no murmurs; no rub  Abdomen: Normoactive bowel sounds, soft, nontender, without masses or organomegaly palpable  Extremities: No pretibial edema.   Neuro: MS is good with appropriate affect, pt is alert and Ox3      DATA REVIEWED:  Lab Results  Component Value Date   HGBA1C 10.9 (H) 04/30/2021   HGBA1C 9.6 (H) 12/23/2020   HGBA1C 11.0 (A) 05/21/2020   Lab Results  Component Value Date   MICROALBUR <0.7 10/18/2019   LDLCALC 74 12/23/2020   CREATININE 0.42 04/30/2021   Lab Results  Component Value Date   MICRALBCREAT 1.7 10/18/2019     Lab Results  Component Value Date   CHOL 160 12/23/2020   HDL 69.60 12/23/2020   LDLCALC 74 12/23/2020   LDLDIRECT 193.0 03/21/2019   TRIG 80.0 12/23/2020   CHOLHDL 2 12/23/2020         ASSESSMENT / PLAN / RECOMMENDATIONS:   1) Type 2 Diabetes Mellitus, Poorly controlled, With out complications - Most recent A1c of 10.9 %. Goal A1c < 7.0 %.    - Pt continues with hyperglycemia.  - We discussed adding prandial insulin  - NOT a candidate for GLP-1 agonists and DPP-4 inhibitors due to hx of pancreatitis  - Will prescribe dexcom  -Discussed pharmacokinetics of basal/bolus insulin and the importance of taking prandial insulin with meals.  We also discussed avoiding sugar-sweetened beverages and snacks, when possible.     MEDICATIONS: Change Tresiba to 18 units ONCE DAILY  Start Novolog 6 units with each meal  Continue Jardiance 25 mg , 1 tablet daily   EDUCATION / INSTRUCTIONS: BG monitoring instructions: Patient is instructed to check his blood sugars 3 times a day, before meals . Call [facility] if: BG  persistently < 70  I reviewed the Rule of 15 for the treatment of hypoglycemia in detail with the patient. Literature supplied.   2) Diabetic complications:  Eye: Does not have known diabetic retinopathy.  Neuro/ Feet: Does not have known diabetic peripheral neuropathy .  Renal: Patient does not have known baseline CKD. He   is  on an ACEI/ARB at present.      F/U in 3 months     Signed electronically by: Mack Guise, MD  [facility]  [address] [place], [place] [address] Phone: [phone] FAX: [phone]   CC: Burnis Medin, [place] [address] Phone: [phone]  Fax: [phone]  Return to Endocrinology clinic as below: Future Appointments  Date Time Provider [place]  05/19/2021  9:10 AM Hargun Spurling, Melanie Crazier, MD [facility] None  05/29/2021  3:15 PM [place] [facility]

## 2021-05-23 NOTE — Progress Notes (Signed)
I see that you got appt with Dr Kelton Pillar . Please  make sure you follow up and get sugar under control.   You were very sick with  high blood sugar when you had to go to the hospital .  Make appt with me sometime in November .

## 2021-05-29 ENCOUNTER — Ambulatory Visit (INDEPENDENT_AMBULATORY_CARE_PROVIDER_SITE_OTHER): Payer: Medicare Other

## 2021-05-29 DIAGNOSIS — Z0000 Encounter for general adult medical examination without abnormal findings: Secondary | ICD-10-CM

## 2021-05-29 DIAGNOSIS — Z7189 Other specified counseling: Secondary | ICD-10-CM

## 2021-05-29 NOTE — Progress Notes (Signed)
Subjective:   Todd Rodriguez is a 45 y.o. male who presents for an Initial Medicare Annual Wellness Visit. Virtual Visit via Video Note  I connected with Todd Rodriguez by a video enabled telemedicine application and verified that I am speaking with the correct person using two identifiers.  Location: Patient: Home Provider: Office Persons participating in the virtual visit: patient, provider   I discussed the limitations of evaluation and management by telemedicine and the availability of in person appointments. The patient expressed understanding and agreed to proceed.     Larene [place] [employer]    N/a       Objective:    There were no vitals filed for this visit. There is no height or weight on file to calculate BMI.  Advanced Directives 09/23/2020 05/13/2020 04/30/2020 11/25/2019 10/11/2019 06/19/2019 05/28/2019  Does Patient Have a Medical Advance Directive? No No No No No No No  Would patient like information on creating a medical advance directive? No - Patient declined No - Patient declined No - Patient declined - - - No - Patient declined  Pre-existing out of facility DNR order (yellow form or pink MOST form) - - - - - - -    Current Medications (verified) Outpatient Encounter Medications as of 05/29/2021  Medication Sig   albuterol (VENTOLIN HFA) 108 (90 Base) MCG/ACT inhaler TAKE 2 PUFFS BY MOUTH EVERY 6 HOURS AS NEEDED FOR WHEEZE OR SHORTNESS OF BREATH (Patient taking differently: Inhale 2 puffs into the lungs every 6 (six) hours as needed for wheezing or shortness of breath.)   amlodipine-benazepril (LOTREL) 2.5-10 MG capsule TAKE 1 CAPSULE BY MOUTH DAILY. FOR HIGH BLOOD PRESSURE .DOSE ADJUSTMENT   Aspirin-Acetaminophen (GOODYS BODY PAIN PO) Take 1 Package by mouth daily as needed (headache/pain).   atorvastatin (LIPITOR) 40 MG tablet TAKE 1 TABLET BY MOUTH EVERY DAY (Patient taking differently: Take 40 mg by mouth daily.)   Brimonidine Tartrate  (LUMIFY) 0.025 % SOLN Place 1 drop into both eyes daily as needed (eye irritation/dry eyes.).   citalopram (CELEXA) 20 MG tablet Take 10 mg per day for 1-2 weeks then increase to 20 mg per day or as directed   Continuous Blood Gluc Sensor (DEXCOM G6 SENSOR) MISC 1 Device by Does not apply route as directed.   Continuous Blood Gluc Transmit (DEXCOM G6 TRANSMITTER) MISC 1 Device by Does not apply route as directed.   Cyanocobalamin (VITAMIN B-12 PO) Take 1 tablet by mouth daily.   dicyclomine (BENTYL) 10 MG capsule TAKE 1 CAPSULE FOUR TIMES A DAY BEFORE MEALS AND AT BEDTIME AS NEEDED FOR ABDOMINAL CRAMPING   empagliflozin (JARDIANCE) 25 MG TABS tablet Take 1 tablet (25 mg total) by mouth daily.   folic acid (FOLVITE) 158 MCG tablet Take 800 mcg by mouth daily.   gabapentin (NEURONTIN) 300 MG capsule Take 300 mg by mouth 3 (three) times daily.    glucose blood (ONE TOUCH TEST STRIPS) test strip Use as instructed   insulin aspart (NOVOLOG FLEXPEN) 100 UNIT/ML FlexPen Inject 6 Units into the skin 3 (three) times daily with meals.   Insulin Pen Needle 32G X 4 MM MISC 1 Device by Does not apply route in the morning, at noon, in the evening, and at bedtime.   KLOR-CON M20 20 MEQ tablet Take 40 mEq by mouth 2 (two) times daily.   lipase/protease/amylase (CREON) 36000 UNITS CPEP capsule Take two with meals and one with snacks (total of 8 per day) (Patient taking differently:  Take 36,000-72,000 Units by mouth See admin instructions. Take two with meals and one with snacks (total of 8 per day))   LORazepam (ATIVAN) 1 MG tablet Take 1 tablet (1 mg total) by mouth every 8 (eight) hours as needed for anxiety. Panic attacks   magnesium oxide (MAG-OX) 400 (240 Mg) MG tablet Take 1 tablet by mouth daily.   ondansetron (ZOFRAN ODT) 4 MG disintegrating tablet Take 1 tablet (4 mg total) by mouth every 8 (eight) hours as needed for nausea or vomiting.   pantoprazole (PROTONIX) 40 MG tablet Take 1 tablet (40 mg total) by  mouth daily.   thiamine 100 MG tablet Take 100 mg by mouth daily.   TRESIBA FLEXTOUCH 100 UNIT/ML FlexTouch Pen Inject 18 Units into the skin daily.   No facility-administered encounter medications on file as of 05/29/2021.    Allergies (verified) Patient has no known allergies.   History: Past Medical History:  Diagnosis Date   Acute pancreatitis 10/25/2013   Arthritis    Diabetes mellitus without complication (HCC)    GERD (gastroesophageal reflux disease)    Hepatic steatosis    History of pancreatitis    Hypertension    Myalgia and myositis 04/21/2014   much better from illness presumed infectious    has disability form  will complete     Pancreatic pseudocyst    Past Surgical History:  Procedure Laterality Date   ANTERIOR CERVICAL DECOMP/DISCECTOMY FUSION N/A 01/12/2018   Procedure: Revision ACDF C4-5, removal of hardware C5-6, exploration of fusion C5-6;  Surgeon: [name], MD;  Location: [place];  Service: Orthopedics;  Laterality: N/A;  3.5 hrs   BIOPSY  05/13/2020   Procedure: BIOPSY;  Surgeon: Eloise Harman, DO;  [facility];  Service: Endoscopy;;   BIOPSY  09/23/2020   Procedure: BIOPSY;  Surgeon: Eloise Harman, DO;  [facility];  Service: Endoscopy;;   COLONOSCOPY WITH PROPOFOL N/A 05/13/2020   Dr. Abbey Chatters: Nonbleeding internal hemorrhoids.  Few small mouth diverticula in the sigmoid colon.  2 cecal polyps, inflammatory.  Sigmoid colon polyp hyperplastic.  Localized area of granular mucosa in the transverse colon, status post biopsy which was unremarkable.  No microscopic colitis.  Plans for repeat colonoscopy in 5 years.   ESOPHAGEAL BRUSHING  05/13/2020   Procedure: ESOPHAGEAL BRUSHING;  Surgeon: Eloise Harman, DO;  [facility];  Service: Endoscopy;;  mid esophagus   ESOPHAGOGASTRODUODENOSCOPY (EGD) WITH PROPOFOL N/A 05/13/2020   Dr. Abbey Chatters: Moderately severe esophagitis with no bleeding found in the middle third of the  esophagus, suspicious for Candida esophagitis.  KOH was negative. Gastritis, duodenal polyp.  Duodenal biopsy benign.  Duodenal bulb biopsy with Brunner's gland hyperplasia and mild changes suggestive of peptic injury.  No celiac disease seen.  GE junction biopsy showed GERD with mild inflammation consisten   ESOPHAGOGASTRODUODENOSCOPY (EGD) WITH PROPOFOL N/A 09/23/2020   Procedure: ESOPHAGOGASTRODUODENOSCOPY (EGD) WITH PROPOFOL;  Surgeon: Eloise Harman, DO;  [facility];  Service: Endoscopy;  Laterality: N/A;  7:30am   NECK SURGERY     2 disc   POLYPECTOMY  05/13/2020   Procedure: POLYPECTOMY;  Surgeon: Eloise Harman, DO;  [facility];  Service: Endoscopy;;   SPINAL FUSION     Family History  Problem Relation Age of Onset   Diabetes Mother    Hypertension Mother    Diabetes Father    Hypertension Father    Arthritis Maternal Grandmother    Hypertension  Maternal Grandfather    Diabetes Maternal Grandfather    Colon cancer Neg Hx    Pancreatitis Neg Hx    Pancreatic cancer Neg Hx    Social History   Socioeconomic History   Marital status: Married    Spouse name: Not on file   Number of children: 1   Years of education: Not on file   Highest education level: Not on file  Occupational History    Employer: [employer]  Tobacco Use   Smoking status: Every Day    Packs/day: 1.00    Years: 12.00    Pack years: 12.00    Types: Cigarettes   Smokeless tobacco: Never  Vaping Use   Vaping Use: Never used  Substance and Sexual Activity   Alcohol use: Yes    Comment: every weekend, one drink. 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  No  Does the patient have any non-healing wounds?  No  Has the patient had any unintentional weight loss or weight gain?  No   Diabetes:  Is the patient diabetic?  Yes  If diabetic, was a CBG obtained today?  No  Did the patient bring in their glucometer from home?  No  How often do you monitor your CBG's? 2 to 3 day .   Financial Strains and Diabetes Management:  Are you having any financial strains with the device, your supplies or your medication? No .  Does the patient want to be seen by Chronic Care Management for management of their diabetes?  Yes  Would the patient like to be referred to a Nutritionist or for Diabetic Management?  Yes   Diabetic Exams:  Diabetic Eye Exam: Completed 01/2021 Diabetic Foot Exam: Completed next office visit .           Activities of Daily Living No flowsheet data found.  Patient Care Team: Panosh, Standley Brooking, MD as PCP - General (Internal Medicine) Westly Pam as Physician Assistant (Gastroenterology) Alda Berthold, DO as Consulting Physician (Neurology) [facility], Melanie Crazier, MD as Consulting Physician (Endocrinology) Eloise Harman, DO as Consulting Physician (Internal Medicine)  Indicate any recent Medical Services you may have received from other than [facility] providers in the past year (date may be approximate).     Assessment:   This is a routine wellness examination  for Ly.  Hearing/Vision screen No results found.  Dietary issues and exercise activities discussed:     Goals Addressed   None    Depression Screen PHQ 2/9 Scores 12/23/2020 04/17/2019 01/25/2015  PHQ - 2 Score 2 0 0  PHQ- 9 Score 9 - -    Fall Risk Fall Risk  10/11/2019 06/19/2019  Falls in the past year? 1 1  Number falls in past yr: 1 1  Injury with Fall? 1 1    FALL RISK PREVENTION PERTAINING TO THE HOME:  Any stairs in or around the home? Yes  If so, are there any without handrails? Yes  Home free of loose throw rugs in walkways, pet beds, electrical cords, etc? Yes  Adequate lighting in your home to reduce risk of falls? Yes   ASSISTIVE DEVICES UTILIZED TO PREVENT FALLS:  Life alert? No  Use of a cane, walker or w/c? No  Grab bars in the bathroom? No  Shower chair or bench in shower? No  Elevated toilet seat or a handicapped toilet? No    Cognitive Function:  Normal cognitive status assessed by direct observation by this Nurse Health Advisor. No abnormalities found.        Immunizations Immunization History  Administered Date(s) Administered   Pneumococcal Conjugate-13 01/15/2016   Pneumococcal Polysaccharide-23 09/12/2018    TDAP status: Due, Education has been provided regarding the importance of this vaccine. Advised may receive this vaccine at local pharmacy or Health Dept. Aware to provide a copy of the vaccination record if obtained from local pharmacy or Health Dept. Verbalized acceptance and understanding.  Flu Vaccine status: Up to date  Pneumococcal vaccine status: Due, Education has been provided regarding the importance of this vaccine. Advised may receive this vaccine at local pharmacy or Health Dept. Aware to provide a copy of the vaccination record if obtained from local pharmacy or Health Dept. Verbalized acceptance and understanding.  Covid-19 vaccine status: Completed vaccines  Qualifies for Shingles Vaccine? No   Zostavax completed  No   Shingrix Completed?: No.    Education has been provided regarding the importance of this vaccine. Patient has been advised to call insurance company to determine out of pocket expense if they have not yet received this vaccine. Advised may also receive vaccine at local pharmacy or Health Dept. Verbalized acceptance and understanding.  Screening Tests Health Maintenance  Topic Date Due   COVID-19 Vaccine (1) Never done   OPHTHALMOLOGY EXAM  Never done   TETANUS/TDAP  11/05/2018   INFLUENZA VACCINE  05/05/2021   Hepatitis C Screening  12/23/2021 (Originally 03/22/1994)   HEMOGLOBIN A1C  10/31/2021   FOOT EXAM  12/23/2021   Pneumococcal Vaccine 63-51 Years old (3 - PPSV23 or PCV20) 09/13/2023   COLONOSCOPY (Pts 45-40yr Insurance coverage will need to be confirmed)  05/13/2030   PNEUMOCOCCAL POLYSACCHARIDE VACCINE AGE 90-64 HIGH RISK  Completed   HIV Screening  Completed   HPV VACCINES  Aged Out    Health Maintenance  Health Maintenance Due  Topic Date Due   COVID-19 Vaccine (1) Never done   OPHTHALMOLOGY EXAM  Never done   TETANUS/TDAP  11/05/2018   INFLUENZA VACCINE  05/05/2021    Colorectal cancer screening: Type of screening: Colonoscopy. Completed 05/13/2020. Repeat every 1 years  Lung Cancer Screening: (Low Dose CT Chest recommended if Age 47-80years, 30 pack-year currently smoking OR have quit w/in 15years.) does not qualify.   Lung Cancer Screening Referral: n/a  Additional Screening:  Hepatitis C Screening: does not  qualify; C  Vision Screening: Recommended annual ophthalmology exams for early detection of glaucoma and other disorders of the eye. Is the patient up to date with their annual eye exam?  Yes  Who is the provider or what is the name of the office in which the patient attends annual eye exams? Dr.Fredricks  If pt is not established with a provider, would they like to be referred to a provider to establish care? No .   Dental Screening: Recommended  annual dental exams for proper oral hygiene  Community Resource Referral / Chronic Care Manage ment: CRR required this visit?  No   CCM required this visit?  Yes      Plan:     I have personally reviewed and noted the following in the patient's chart:   Medical and social history Use of alcohol, tobacco or illicit drugs  Current medications and supplements including opioid prescriptions. Patient is not currently taking opioid prescriptions. Functional ability and status Nutritional status Physical activity Advanced directives List of other physicians Hospitalizations, surgeries, and ER visits in previous 12 months Vitals Screenings to include cognitive, depression, and falls Referrals and appointments  In addition, I have reviewed and discussed with patient certain preventive protocols, quality metrics, and best practice recommendations. A written personalized care plan for preventive services as well as general preventive health recommendations were provided to patient.     Randel Pigg, LPN   [id number]   Nurse Notes: uncontrolled diabetes referral diabetic education and nutritional education.

## 2021-05-29 NOTE — Patient Instructions (Signed)
[name] , Thank you for taking time to come for your Medicare Wellness Visit. I appreciate your ongoing commitment to your health goals. Please review the following plan we discussed and let me know if I can assist you in the future.   Screening recommendations/referrals: Colonoscopy: 05/13/2020 Recommended yearly ophthalmology/optometry visit for glaucoma screening and checkup Recommended yearly dental visit for hygiene and checkup  Vaccinations: Influenza vaccine: due in fall 2022  Pneumococcal vaccine: not  age eligible  Tdap vaccine: will obtain upon injury  Shingles vaccine: not age eligible   Advanced directives: none   Conditions/risks identified: uncontrolled diabetes referral diabetic education and nutritional education.   Next appointment: 08/25/2021  230pm with Dr.Panosh   Preventive Care 47 Years and Older, Male Preventive care refers to lifestyle choices and visits with your health care provider that can promote health and wellness. What does preventive care include? A yearly physical exam. This is also called an annual well check. Dental exams once or twice a year. Routine eye exams. Ask your health care provider how often you should have your eyes checked. Personal lifestyle choices, including: Daily care of your teeth and gums. Regular physical activity. Eating a healthy diet. Avoiding tobacco and drug use. Limiting alcohol use. Practicing safe sex. Taking low doses of aspirin every day. Taking vitamin and mineral supplements as recommended by your health care provider. What happens during an annual well check? The services and screenings done by your health care provider during your annual well check will depend on your age, overall health, lifestyle risk factors, and family history of disease. Counseling  Your health care provider may ask you questions about your: Alcohol use. Tobacco use. Drug use. Emotional well-being. Home and relationship  well-being. Sexual activity. Eating habits. History of falls. Memory and ability to understand (cognition). Work and work Statistician. Screening  You may have the following tests or measurements: Height, weight, and BMI. Blood pressure. Lipid and cholesterol levels. These may be checked every 5 years, or more frequently if you are over 47 years old. Skin check. Lung cancer screening. You may have this screening every year starting at age 47 if you have a 30-pack-year history of smoking and currently smoke or have quit within the past 15 years. Fecal occult blood test (FOBT) of the stool. You may have this test every year starting at age 47. Flexible sigmoidoscopy or colonoscopy. You may have a sigmoidoscopy every 5 years or a colonoscopy every 10 years starting at age 47. Prostate cancer screening. Recommendations will vary depending on your family history and other risks. Hepatitis C blood test. Hepatitis B blood test. Sexually transmitted disease (STD) testing. Diabetes screening. This is done by checking your blood sugar (glucose) after you have not eaten for a while (fasting). You may have this done every 1-3 years. Abdominal aortic aneurysm (AAA) screening. You may need this if you are a current or former smoker. Osteoporosis. You may be screened starting at age 47 if you are at high risk. Talk with your health care provider about your test results, treatment options, and if necessary, the need for more tests. Vaccines  Your health care provider may recommend certain vaccines, such as: Influenza vaccine. This is recommended every year. Tetanus, diphtheria, and acellular pertussis (Tdap, Td) vaccine. You may need a Td booster every 10 years. Zoster vaccine. You may need this after age 13. Pneumococcal 13-valent conjugate (PCV13) vaccine. One dose is recommended after age 47. Pneumococcal polysaccharide (PPSV23) vaccine. One dose is recommended  after age 47. Talk to your health care  provider about which screenings and vaccines you need and how often you need them. This information is not intended to replace advice given to you by your health care provider. Make sure you discuss any questions you have with your health care provider. Document Released: 10/18/2015 Document Revised: 06/10/2016 Document Reviewed: 07/23/2015 Elsevier Interactive Patient Education  2017 [place] Prevention in the Home Falls can cause injuries. They can happen to people of all ages. There are many things you can do to make your home safe and to help prevent falls. What can I do on the outside of my home? Regularly fix the edges of walkways and driveways and fix any cracks. Remove anything that might make you trip as you walk through a door, such as a raised step or threshold. Trim any bushes or trees on the path to your home. Use bright outdoor lighting. Clear any walking paths of anything that might make someone trip, such as rocks or tools. Regularly check to see if handrails are loose or broken. Make sure that both sides of any steps have handrails. Any raised decks and porches should have guardrails on the edges. Have any leaves, snow, or ice cleared regularly. Use sand or salt on walking paths during winter. Clean up any spills in your garage right away. This includes oil or grease spills. What can I do in the bathroom? Use night lights. Install grab bars by the toilet and in the tub and shower. Do not use towel bars as grab bars. Use non-skid mats or decals in the tub or shower. If you need to sit down in the shower, use a plastic, non-slip stool. Keep the floor dry. Clean up any water that spills on the floor as soon as it happens. Remove soap buildup in the tub or shower regularly. Attach bath mats securely with double-sided non-slip rug tape. Do not have throw rugs and other things on the floor that can make you trip. What can I do in the bedroom? Use night lights. Make  sure that you have a light by your bed that is easy to reach. Do not use any sheets or blankets that are too big for your bed. They should not hang down onto the floor. Have a firm chair that has side arms. You can use this for support while you get dressed. Do not have throw rugs and other things on the floor that can make you trip. What can I do in the kitchen? Clean up any spills right away. Avoid walking on wet floors. Keep items that you use a lot in easy-to-reach places. If you need to reach something above you, use a strong step stool that has a grab bar. Keep electrical cords out of the way. Do not use floor polish or wax that makes floors slippery. If you must use wax, use non-skid floor wax. Do not have throw rugs and other things on the floor that can make you trip. What can I do with my stairs? Do not leave any items on the stairs. Make sure that there are handrails on both sides of the stairs and use them. Fix handrails that are broken or loose. Make sure that handrails are as long as the stairways. Check any carpeting to make sure that it is firmly attached to the stairs. Fix any carpet that is loose or worn. Avoid having throw rugs at the top or bottom of the stairs. If you  do have throw rugs, attach them to the floor with carpet tape. Make sure that you have a light switch at the top of the stairs and the bottom of the stairs. If you do not have them, ask someone to add them for you. What else can I do to help prevent falls? Wear shoes that: Do not have high heels. Have rubber bottoms. Are comfortable and fit you well. Are closed at the toe. Do not wear sandals. If you use a stepladder: Make sure that it is fully opened. Do not climb a closed stepladder. Make sure that both sides of the stepladder are locked into place. Ask someone to hold it for you, if possible. Clearly mark and make sure that you can see: Any grab bars or handrails. First and last steps. Where the  edge of each step is. Use tools that help you move around (mobility aids) if they are needed. These include: Canes. Walkers. Scooters. Crutches. Turn on the lights when you go into a dark area. Replace any light bulbs as soon as they burn out. Set up your furniture so you have a clear path. Avoid moving your furniture around. If any of your floors are uneven, fix them. If there are any pets around you, be aware of where they are. Review your medicines with your doctor. Some medicines can make you feel dizzy. This can increase your chance of falling. Ask your doctor what other things that you can do to help prevent falls. This information is not intended to replace advice given to you by your health care provider. Make sure you discuss any questions you have with your health care provider. Document Released: 07/18/2009 Document Revised: 02/27/2016 Document Reviewed: 10/26/2014 Elsevier Interactive Patient Education  2017 [employer].

## 2021-05-30 ENCOUNTER — Telehealth: Payer: Self-pay | Admitting: Internal Medicine

## 2021-05-30 ENCOUNTER — Telehealth: Payer: Self-pay | Admitting: *Deleted

## 2021-05-30 NOTE — Telephone Encounter (Signed)
Pt calling in stating that [facility] is still needing information from [place] still regarding the dexcom g6

## 2021-05-30 NOTE — Telephone Encounter (Signed)
Re-faxed the complete form with last OV to [facility]--[phone].

## 2021-05-30 NOTE — Chronic Care Management (AMB) (Signed)
  Chronic Care Management   Note  05/30/2021 Name: Todd Rodriguez MRN: [id number] DOB: December 13, 1975  Todd Rodriguez is a 45 y.o. year old male who is a primary care patient of Panosh, Standley Brooking, MD. I reached out to Todd Rodriguez by phone today in response to a referral sent by Mr. Garrus Gauthreaux PCP, Dr. Regis Bill.       [name] was given information about Chronic Care Management services today including:  CCM service includes personalized support from designated clinical staff supervised by his physician, including individualized plan of care and coordination with other care providers 24/7 contact phone numbers for assistance for urgent and routine care needs. Service will only be billed when office clinical staff spend 20 minutes or more in a month to coordinate care. Only one practitioner may furnish and bill the service in a calendar month. The patient may stop CCM services at any time (effective at the end of the month) by phone call to the office staff. The patient will be responsible for cost sharing (co-pay) of up to 20% of the service fee (after annual deductible is met).  Patient agreed to services and verbal consent obtained.   Follow up plan: Telephone appointment with care management team member scheduled for:06/12/21  [place] [facility]  Direct Dial: [phone]

## 2021-06-10 ENCOUNTER — Telehealth: Payer: Medicare Other

## 2021-06-10 ENCOUNTER — Telehealth: Payer: Self-pay

## 2021-06-12 ENCOUNTER — Ambulatory Visit (INDEPENDENT_AMBULATORY_CARE_PROVIDER_SITE_OTHER): Payer: Medicare Other

## 2021-06-12 DIAGNOSIS — E785 Hyperlipidemia, unspecified: Secondary | ICD-10-CM

## 2021-06-12 DIAGNOSIS — E1165 Type 2 diabetes mellitus with hyperglycemia: Secondary | ICD-10-CM

## 2021-06-12 DIAGNOSIS — I10 Essential (primary) hypertension: Secondary | ICD-10-CM

## 2021-06-12 NOTE — Chronic Care Management (AMB) (Signed)
Chronic Care Management   CCM RN Visit Note  06/12/2021 Name: Todd Rodriguez MRN: [id number] DOB: 1976/08/19  Subjective: Todd Rodriguez is a 45 y.o. year old male who is a primary care patient of Panosh, Standley Brooking, MD. The care management team was consulted for assistance with disease management and care coordination needs.    Engaged with patient by telephone for initial visit in response to provider referral for case management and/or care coordination services.   Consent to Services:  The patient was given the following information about Chronic Care Management services today, agreed to services, and gave verbal consent: 1. CCM service includes personalized support from designated clinical staff supervised by the primary care provider, including individualized plan of care and coordination with other care providers 2. 24/7 contact phone numbers for assistance for urgent and routine care needs. 3. Service will only be billed when office clinical staff spend 20 minutes or more in a month to coordinate care. 4. Only one practitioner may furnish and bill the service in a calendar month. 5.The patient may stop CCM services at any time (effective at the end of the month) by phone call to the office staff. 6. The patient will be responsible for cost sharing (co-pay) of up to 20% of the service fee (after annual deductible is met). Patient agreed to services and consent obtained.  Patient agreed to services and verbal consent obtained.   Assessment: Review of patient past medical history, allergies, medications, health status, including review of consultants reports, laboratory and other test data, was performed as part of comprehensive evaluation and provision of chronic care management services.   SDOH (Social Determinants of Health) assessments and interventions performed:  SDOH Interventions    Flowsheet Row Most Recent Value  SDOH Interventions   Food Insecurity Interventions Intervention Not  Indicated  Financial Strain Interventions Intervention Not Indicated  Housing Interventions Intervention Not Indicated  Stress Interventions Intervention Not Indicated  Transportation Interventions Intervention Not Indicated        CCM Care Plan  No Known Allergies  Outpatient Encounter Medications as of 06/12/2021  Medication Sig   albuterol (VENTOLIN HFA) 108 (90 Base) MCG/ACT inhaler TAKE 2 PUFFS BY MOUTH EVERY 6 HOURS AS NEEDED FOR WHEEZE OR SHORTNESS OF BREATH (Patient taking differently: Inhale 2 puffs into the lungs every 6 (six) hours as needed for wheezing or shortness of breath.)   amlodipine-benazepril (LOTREL) 2.5-10 MG capsule TAKE 1 CAPSULE BY MOUTH DAILY. FOR HIGH BLOOD PRESSURE .DOSE ADJUSTMENT   Aspirin-Acetaminophen (GOODYS BODY PAIN PO) Take 1 Package by mouth daily as needed (headache/pain).   atorvastatin (LIPITOR) 40 MG tablet TAKE 1 TABLET BY MOUTH EVERY DAY (Patient taking differently: Take 40 mg by mouth daily.)   Brimonidine Tartrate (LUMIFY) 0.025 % SOLN Place 1 drop into both eyes daily as needed (eye irritation/dry eyes.).   citalopram (CELEXA) 20 MG tablet Take 10 mg per day for 1-2 weeks then increase to 20 mg per day or as directed   Continuous Blood Gluc Sensor (DEXCOM G6 SENSOR) MISC 1 Device by Does not apply route as directed.   Continuous Blood Gluc Transmit (DEXCOM G6 TRANSMITTER) MISC 1 Device by Does not apply route as directed.   Cyanocobalamin (VITAMIN B-12 PO) Take 1 tablet by mouth daily.   dicyclomine (BENTYL) 10 MG capsule TAKE 1 CAPSULE FOUR TIMES A DAY BEFORE MEALS AND AT BEDTIME AS NEEDED FOR ABDOMINAL CRAMPING   empagliflozin (JARDIANCE) 25 MG TABS tablet Take 1 tablet (25 mg total) by  mouth daily.   folic acid (FOLVITE) 361 MCG tablet Take 800 mcg by mouth daily.   gabapentin (NEURONTIN) 300 MG capsule Take 300 mg by mouth 3 (three) times daily.    glucose blood (ONE TOUCH TEST STRIPS) test strip Use as instructed   insulin aspart (NOVOLOG  FLEXPEN) 100 UNIT/ML FlexPen Inject 6 Units into the skin 3 (three) times daily with meals.   Insulin Pen Needle 32G X 4 MM MISC 1 Device by Does not apply route in the morning, at noon, in the evening, and at bedtime.   KLOR-CON M20 20 MEQ tablet Take 40 mEq by mouth 2 (two) times daily.   lipase/protease/amylase (CREON) 36000 UNITS CPEP capsule Take two with meals and one with snacks (total of 8 per day) (Patient taking differently: Take 36,000-72,000 Units by mouth See admin instructions. Take two with meals and one with snacks (total of 8 per day))   LORazepam (ATIVAN) 1 MG tablet Take 1 tablet (1 mg total) by mouth every 8 (eight) hours as needed for anxiety. Panic attacks   magnesium oxide (MAG-OX) 400 (240 Mg) MG tablet Take 1 tablet by mouth daily.   ondansetron (ZOFRAN ODT) 4 MG disintegrating tablet Take 1 tablet (4 mg total) by mouth every 8 (eight) hours as needed for nausea or vomiting.   pantoprazole (PROTONIX) 40 MG tablet Take 1 tablet (40 mg total) by mouth daily.   thiamine 100 MG tablet Take 100 mg by mouth daily.   TRESIBA FLEXTOUCH 100 UNIT/ML FlexTouch Pen Inject 18 Units into the skin daily.   No facility-administered encounter medications on file as of 06/12/2021.    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/03/2018   Carpal tunnel syndrome of left wrist 12/10/2017   Cervical radiculopathy 11/11/2017   History of fusion of cervical spine 10/22/2017   Cramps,  extremity 06/25/2015   Diabetes mellitus type 2, uncontrolled, without complications 44/31/5400   Hand cramps 01/25/2015   Fever, unspecified 03/26/2014   Hx of acute pancreatitis 01/03/2014   Tobacco dependence 01/03/2014   Other and unspecified hyperlipidemia 01/03/2014   GERD (gastroesophageal reflux disease) 11/22/2013   Diabetes mellitus type 2, controlled, without complications ([place]) 86/76/1950   Essential hypertension, benign 10/25/2013   Leukocytosis, unspecified 10/25/2013    Conditions to be addressed/monitored:HTN, HLD, and DMII  Care Plan : Diabetes Type 2 (Adult)  Updates made by Dimitri Ped, RN since 06/12/2021 12:00 AM     Problem: Lack of long term self management of Type 2 Diabetes   Priority: High     Long-Range Goal: Effective long term self management of Type 2 Diabetes   Start Date: 06/12/2021  Expected End Date: 12/03/2021  This Visit's Progress: On track  Priority: High  Note:   Objective:  Lab Results  Component Value Date   HGBA1C 10.9 (H) 04/30/2021   Lab Results  Component Value Date   CREATININE 0.42 04/30/2021   CREATININE 0.64 12/23/2020   CREATININE 0.59 (L) 05/10/2020   No results  found for: EGFR Current Barriers:  Knowledge Deficits related to basic Diabetes pathophysiology and self care/management Knowledge Deficits related to medications used for management of diabetes Unable to independently self manage Type 2 Diabetes Does not adhere to provider recommendations re: diet States he has been taking his meal time insulin and the [place] as ordered.  States he is checking his CBG twice a day States that his fasting readings have ranged from 130-190 and his readings around 5 PM are from 190-330,  Denies any hypoglycemia.  States he is trying to eat less bread and more protein.  States he is drinking 2 Ensures a day to try to gain some of his weight back.  States he has not drank any alcohol since he was in the hospital in July for DKA.   States he is trying to not drink sodas but he does sometimes drink apple juice.  States he is waiting to get the Dexcom continuous glucose monitor.   Case Manager Clinical Goal(s):  patient will demonstrate improved adherence to prescribed treatment plan for diabetes self care/management as evidenced by: daily monitoring and recording of CBG  adherence to ADA/ carb modified diet adherence to prescribed medication regimen contacting provider for new or worsened symptoms or questions Interventions:  Collaboration with Panosh, Standley Brooking, MD regarding development and update of comprehensive plan of care as evidenced by provider attestation and co-signature Inter-disciplinary care team collaboration (see longitudinal plan of care) Provided education to patient about basic DM disease process Reviewed medications with patient and discussed importance of medication adherence Discussed plans with patient for ongoing care management follow up and provided patient with direct contact information for care management team Provided patient with written educational materials related to hypo and hyperglycemia and importance of correct treatment Reviewed scheduled/upcoming provider appointments including: Dr.Carver GI 07/09/21, Dr. Kelton Pillar 08/22/21, Dr. Regis Bill 08/25/21 Advised patient, providing education and rationale, to check cbg twice a day and record, calling provider for findings outside established parameters.   Review of patient status, including review of consultants reports, relevant laboratory and other test results, and medications completed. Reviewed to try drinking diabetic nutritional supplements to help gain weight Reviewed to avoid drinking sugar sweetened drinks  Self-Care Activities - Self administers oral medications as prescribed Self administers insulin as prescribed Attends all scheduled provider appointments Checks blood sugars as prescribed and utilize hyper and hypoglycemia protocol as  needed Adheres to prescribed ADA/carb modified Patient Goals: - check blood sugar at prescribed times - check blood sugar if I feel it is too high or too low - enter blood sugar readings and medication or insulin into daily log - take the blood sugar log to all doctor visits - take the blood sugar meter to all doctor visits - change to whole grain breads, cereal, pasta - drink 6 to 8 glasses of water each day - fill half of plate with vegetables - limit fast food meals to no more than 1 per week - manage portion size - prepare main meal at home 3 to 5 days each week - read food labels for fat, fiber, carbohydrates and portion size - switch to low-fat or skim milk - switch to sugar-free drinks - keep appointment with eye doctor - check feet daily for cuts, sores or redness - keep feet up while sitting - trim toenails straight across - wash and dry feet carefully every day - wear comfortable, cotton socks - wear comfortable, well-fitting shoes -Switch to diabetic nutritional supplements Follow Up Plan: Telephone  follow up appointment with care management team member scheduled for: 106/22 at 3:30 PM The patient has been provided with contact information for the care management team and has been advised to call with any health related questions or concerns.      Care Plan : Cardiovascular disease  (HTN and HLD)  Updates made by Dimitri Ped, RN since 06/12/2021 12:00 AM     Problem: Need for self management of Cardiovascular disease  (HTN and HLD)   Priority: Medium     Long-Range Goal: Effective self managment of Cardiovascular disease (HTN and HLD)   Start Date: 06/12/2021  Expected End Date: 12/03/2021  This Visit's Progress: On track  Priority: Medium  Note:   Current Barriers:  Knowledge Deficits related to basic understanding of Cardiovascular disease (HTN and HLD) pathophysiology and self care management Unable to independently self manage Cardiovascular disease (HTN  and HLD) Nurse Case Manager Clinical Goal(s):  patient will verbalize understanding of plan for Cardiovascular disease (HTN and HLD) management patient will not experience hospital admission. Hospital Admissions in last 6 months = 1 patient will attend all scheduled medical appointments: Dr.Carver GI 07/09/21, Dr. Kelton Pillar 08/22/21, Dr. Regis Bill 08/25/21 patient will demonstrate improved adherence to prescribed treatment plan for hypertension as evidenced by taking all medications as prescribed, monitoring and recording blood pressure as directed, adhering to low sodium/DASH diet patient will demonstrate improved health management independence as evidenced by checking blood pressure as directed and notifying PCP if SBP>160 or DBP > 90, taking all medications as prescribe, and adhering to a low sodium diet as discussed. patient will verbalize basic understanding of Cardiovascular disease (HTN and HLD) disease process and self health management plan as evidenced by readings within limits, adherence to plan of care Interventions:  Collaboration with Panosh, Standley Brooking, MD regarding development and update of comprehensive plan of care as evidenced by provider attestation and co-signature Inter-disciplinary care team collaboration (see longitudinal plan of care) Evaluation of current treatment plan related to hypertension self management and patient's adherence to plan as established by provider. Provided education to patient re: stroke prevention, s/s of heart attack and stroke, DASH diet, complications of uncontrolled blood pressure Reviewed medications with patient and discussed importance of compliance Discussed plans with patient for ongoing care management follow up and provided patient with direct contact information for care management team Advised patient, providing education and rationale, to monitor blood pressure daily and record, calling PCP for findings outside established parameters.  Reviewed  scheduled/upcoming provider appointments including: Dr.Carver GI 07/09/21, Dr. Kelton Pillar 08/22/21, Dr. Regis Bill 08/25/21 Reviewed to avoid saturated fats, trans-fats and eat more fiber Reviewed to take statin as ordered Reviewed to lower fatty foods, red meat, cheese, milk and increase fiber like whole grains and veggies. Self-Care Activities:  Patient verbalizes understanding of plan to self manage Cardiovascular disease (HTN and HLD)  Self administers medications as prescribed Attends all scheduled provider appointments Calls pharmacy for medication refills Calls provider office for new concerns or questions Patient Goals  - Self administer medications as prescribed  - Attend all scheduled provider appointments  - Call provider office for new concerns, questions, or BP outside discussed parameters  - Check BP and record as discussed  - Follows a low sodium diet/DASH diet - check blood pressure daily - choose a place to take my blood pressure (home, clinic or office, retail store) - write blood pressure results in a log or diary - ask questions to understand - learn about high blood pressure -  change to whole grain breads, cereal, pasta - drink 6 to 8 glasses of water each day - fill half of plate with vegetables - limit fast food meals to no more than 1 per week - manage portion size - prepare main meal at home 3 to 5 days each week - read food labels for fat, fiber, carbohydrates and portion size - switch to low-fat or skim milk - switch to sugar-free drinks Follow Up Plan: Telephone follow up appointment with care management team member scheduled for: 07/10/21 at 3:30 PM The patient has been provided with contact information for the care management team and has been advised to call with any health related questions or concerns.       Plan:Telephone follow up appointment with care management team member scheduled for:  07/10/21 and The patient has been provided with contact  information for the care management team and has been advised to call with any health related questions or concerns.  Peter Garter RN, Jackquline Denmark, CDE Care Management Coordinator [facility] [phone], Mobile [phone]

## 2021-06-12 NOTE — Telephone Encounter (Signed)
Takes 5 injections daily, dexcom, says [facility] doesn't have enough "information". Has patient down for 2 injections, but takes more. Pharmacy cancelled order saying pt doesn't take enough insulin, which is incorrect.

## 2021-06-12 NOTE — Patient Instructions (Signed)
Visit Information   PATIENT GOALS:   Goals Addressed             This Visit's Progress    RNCM:Eat Healthy   On track    Timeframe:  Long-Range Goal Priority:  High Start Date:     06/12/21                        Expected End Date:     12/03/21                  Follow Up Date 07/10/21    - change to whole grain breads, cereal, pasta - drink 6 to 8 glasses of water each day - fill half of plate with vegetables - limit fast food meals to no more than 1 per week - manage portion size - prepare main meal at home 3 to 5 days each week - read food labels for fat, fiber, carbohydrates and portion size - switch to low-fat or skim milk - switch to sugar-free drinks -Switch to diabetic nutritional supplements    Why is this important?   When you are ready to manage your nutrition or weight, having a plan and setting goals will help.  Taking small steps to change how you eat and exercise is a good place to start.    Notes:      RNCM:Monitor and Manage My Blood Sugar-Diabetes Type 2   On track    Timeframe:  Long-Range Goal Priority:  High Start Date:    06/12/21                         Expected End Date:   12/03/21                    Follow Up Date 07/10/21    - check blood sugar at prescribed times - check blood sugar if I feel it is too high or too low - enter blood sugar readings and medication or insulin into daily log - take the blood sugar log to all doctor visits - take the blood sugar meter to all doctor visits    Why is this important?   Checking your blood sugar at home helps to keep it from getting very high or very low.  Writing the results in a diary or log helps the doctor know how to care for you.  Your blood sugar log should have the time, date and the results.  Also, write down the amount of insulin or other medicine that you take.  Other information, like what you ate, exercise done and how you were feeling, will also be helpful.     Notes:      RNCM:Track and  Manage My Blood Pressure-Hypertension   On track    Timeframe:  Long-Range Goal Priority:  Medium Start Date:     06/12/21                        Expected End Date:    12/03/21                   Follow Up Date 07/10/21    - check blood pressure daily - choose a place to take my blood pressure (home, clinic or office, retail store) - write blood pressure results in a log or diary    Why is this important?   You won't  feel high blood pressure, but it can still hurt your blood vessels.  High blood pressure can cause heart or kidney problems. It can also cause a stroke.  Making lifestyle changes like losing a little weight or eating less salt will help.  Checking your blood pressure at home and at different times of the day can help to control blood pressure.  If the doctor prescribes medicine remember to take it the way the doctor ordered.  Call the office if you cannot afford the medicine or if there are questions about it.     Notes:       Type 2 Diabetes Mellitus, Self-Care, Adult When you have type 2 diabetes (type 2 diabetes mellitus), you must make sure your blood sugar (glucose) stays in a healthy range. You can do this with: Nutrition. Exercise. Lifestyle changes. Medicines or insulin, if needed. Support from your doctors and others. What are the risks? Having type 2 diabetes can raise your risk for other long-term (chronic) health problems. You may get medicines to help prevent these problems. How to stay aware of your blood sugar  Check your blood sugar level every day, as often as told. Have your A1C (hemoglobin A1C) level checked two or more times a year. Have it checked more often if told. Your doctor will set personal treatment goals for you. In general, you should have these blood sugar levels: Before meals: 80-130 mg/dL (4.4-7.2 mmol/L). After meals: below 180 mg/dL (10 mmol/L). A1C: less than 7%. How to manage high and low blood sugar Symptoms of high blood  sugar High blood sugar is also called hyperglycemia. Know the symptoms of high blood sugar. These may include: More thirst. Hunger. Feeling very tired. Needing to pee (urinate) more often than normal. Seeing things blurry. Symptoms of low blood sugar Low blood sugar is also called hypoglycemia. This is when blood sugar is at or below 70 mg/dL (3.9 mmol/L). Symptoms may include: Hunger. Feeling worried or nervous (anxious). Feeling sweaty and cold to the touch (clammy). Being dizzy or light-headed. Feeling sleepy. A fast heartbeat. Feeling grouchy (irritable). Tingling or loss of feeling (numbness) around your mouth, lips, or tongue. Restless sleep. Diabetes medicines can cause low blood sugar. You are more at risk: While you exercise. After exercise. During sleep. When you are sick. When you skip meals or do not eat for a long time. Treating low blood sugar If you think you have low blood sugar, eat or drink something sugary right away. Keep 15 grams of a fast-acting carb (carbohydrate) with you all the time. Make sure your family and friends know how to treat you if you cannot treat yourself. Treating very low blood sugar Severe hypoglycemia is when your blood sugar is at or below 54 mg/dL (3 mmol/L). Severe hypoglycemia is an emergency. Get medical help right away. Call your local emergency services (911 in the [place] Do not wait to see if the symptoms will go away. Do not drive yourself to the hospital. You may need a glucagon shot if you have very low blood sugar and you cannot eat or drink. Have a family member or friend learn how to check your blood sugar and how to give you a glucagon shot. Ask your doctor if you should have a kit for glucagon shots. Follow these instructions at home: Medicines Take prescribed insulin or diabetes medicines as told by your health care provider. Do not run out of insulin or other medicines. Plan ahead. If you use insulin, change  the amount  you take based on how active you are and what foods you eat. Your doctor will tell you how to do this. Take over-the-counter and prescription medicines only as told by your doctor. Eating and drinking  Eat healthy foods. These include: Low-fat (lean) proteins. Complex carbs, such as whole grains. Fresh fruits and vegetables. Low-fat dairy products. Healthy fats. Meet with a food expert (dietitian) to make an eating plan. Follow instructions from your doctor about what you cannot eat or drink. Drink enough fluid to keep your pee (urine) pale yellow. Keep track of carbs that you eat. Read food labels and learn serving sizes of foods. Follow your sick-day plan when you cannot eat or drink as normal. Make this plan with your doctor so it is ready to use. Activity Exercise as told by your doctor. You may need to: Do stretching and strength exercises two or more times a week. Do 150 minutes or more of exercise each week that makes your heart beat faster and makes you sweat. Spread out your exercise over 3 or more days a week. Do not go more than 2 days in a row without exercise. Talk with your doctor before you start a new exercise. Your doctor may tell you to change: How much insulin or medicines you take. How much food you eat. Lifestyle Do not smoke or use any products that contain nicotine or tobacco. If you need help quitting, ask your doctor. If you drink alcohol and your doctor says that it is safe for you: Limit how much you have to: 0-1 drink a day for women who are not pregnant. 0-2 drinks a day for men. Know how much alcohol is in your drink. In the [place] one drink equals one 12 oz bottle of beer (355 mL), one 5 oz glass of wine (148 mL), or one 1 oz glass of hard liquor (44 mL). Learn to deal with stress. If you need help, ask your doctor. Body care  Stay up to date with your shots (immunizations). Have your eyes and feet checked by a doctor as often as told. Check your  skin and feet every day. Check for cuts, bruises, redness, blisters, or sores. Brush your teeth and gums two times a day. Floss one or more times a day. Go to the dentist one or more times every 6 months. Stay at a healthy weight. General instructions Share your diabetes care plan with: Your work or school. People you live with. Carry a card or wear jewelry that says you have diabetes. Keep all follow-up visits. Questions to ask your doctor Do I need to meet with a certified expert in diabetes education and care? Where can I find a support group? Where to find more information For help and guidance and more information about diabetes, please go to: American Diabetes Association: www.diabetes.org American Association of Diabetes Care and Education Specialists: www.diabeteseducator.org International Diabetes Federation: MemberVerification.ca Summary When you have type 2 diabetes, you must make sure your blood sugar (glucose) stays in a healthy range. You can do this with nutrition, exercise, medicines and insulin, and support from doctors and others. Check your blood sugar every day, or as often as told. Having diabetes can raise your risk for other long-term health problems. You may get medicines to help prevent these problems. Share your diabetes management plan with people at work, school, and home. Keep all follow-up visits. This information is not intended to replace advice given to you by your health care provider.  Make sure you discuss any questions you have with your health care provider. Document Revised: 12/16/2020 Document Reviewed: 12/16/2020 Elsevier Patient Education  2022 [employer].   Consent to CCM Services: Todd Rodriguez was given information about Chronic Care Management services including:  CCM service includes personalized support from designated clinical staff supervised by his physician, including individualized plan of care and coordination with other care providers 24/7  contact phone numbers for assistance for urgent and routine care needs. Service will only be billed when office clinical staff spend 20 minutes or more in a month to coordinate care. Only one practitioner may furnish and bill the service in a calendar month. The patient may stop CCM services at any time (effective at the end of the month) by phone call to the office staff. The patient will be responsible for cost sharing (co-pay) of up to 20% of the service fee (after annual deductible is met). Hypertension, Adult Hypertension is another name for high blood pressure. High blood pressure forces your heart to work harder to pump blood. This can cause problems over time. There are two numbers in a blood pressure reading. There is a top number (systolic) over a bottom number (diastolic). It is best to have a blood pressure that is below 120/80. Healthy choices can help lower your blood pressure, or you may need medicine to help lower it. What are the causes? The cause of this condition is not known. Some conditions may be related to high blood pressure. What increases the risk? Smoking. Having type 2 diabetes mellitus, high cholesterol, or both. Not getting enough exercise or physical activity. Being overweight. Having too much fat, sugar, calories, or salt (sodium) in your diet. Drinking too much alcohol. Having long-term (chronic) kidney disease. Having a family history of high blood pressure. Age. Risk increases with age. Race. You may be at higher risk if you are African American. Gender. Men are at higher risk than women before age 85. After age 32, women are at higher risk than men. Having obstructive sleep apnea. Stress. What are the signs or symptoms? High blood pressure may not cause symptoms. Very high blood pressure (hypertensive crisis) may cause: Headache. Feelings of worry or nervousness (anxiety). Shortness of breath. Nosebleed. A feeling of being sick to your stomach  (nausea). Throwing up (vomiting). Changes in how you see. Very bad chest pain. Seizures. How is this treated? This condition is treated by making healthy lifestyle changes, such as: Eating healthy foods. Exercising more. Drinking less alcohol. Your health care provider may prescribe medicine if lifestyle changes are not enough to get your blood pressure under control, and if: Your top number is above 130. Your bottom number is above 80. Your personal target blood pressure may vary. Follow these instructions at home: Eating and drinking  If told, follow the DASH eating plan. To follow this plan: Fill one half of your plate at each meal with fruits and vegetables. Fill one fourth of your plate at each meal with whole grains. Whole grains include whole-wheat pasta, brown rice, and whole-grain bread. Eat or drink low-fat dairy products, such as skim milk or low-fat yogurt. Fill one fourth of your plate at each meal with low-fat (lean) proteins. Low-fat proteins include fish, chicken without skin, eggs, beans, and tofu. Avoid fatty meat, cured and processed meat, or chicken with skin. Avoid pre-made or processed food. Eat less than 1,500 mg of salt each day. Do not drink alcohol if: Your doctor tells you not to drink.  You are pregnant, may be pregnant, or are planning to become pregnant. If you drink alcohol: Limit how much you use to: 0-1 drink a day for women. 0-2 drinks a day for men. Be aware of how much alcohol is in your drink. In the [place] one drink equals one 12 oz bottle of beer (355 mL), one 5 oz glass of wine (148 mL), or one 1 oz glass of hard liquor (44 mL). Lifestyle  Work with your doctor to stay at a healthy weight or to lose weight. Ask your doctor what the best weight is for you. Get at least 30 minutes of exercise most days of the week. This may include walking, swimming, or biking. Get at least 30 minutes of exercise that strengthens your muscles (resistance  exercise) at least 3 days a week. This may include lifting weights or doing Pilates. Do not use any products that contain nicotine or tobacco, such as cigarettes, e-cigarettes, and chewing tobacco. If you need help quitting, ask your doctor. Check your blood pressure at home as told by your doctor. Keep all follow-up visits as told by your doctor. This is important. Medicines Take over-the-counter and prescription medicines only as told by your doctor. Follow directions carefully. Do not skip doses of blood pressure medicine. The medicine does not work as well if you skip doses. Skipping doses also puts you at risk for problems. Ask your doctor about side effects or reactions to medicines that you should watch for. Contact a doctor if you: Think you are having a reaction to the medicine you are taking. Have headaches that keep coming back (recurring). Feel dizzy. Have swelling in your ankles. Have trouble with your vision. Get help right away if you: Get a very bad headache. Start to feel mixed up (confused). Feel weak or numb. Feel faint. Have very bad pain in your: Chest. Belly (abdomen). Throw up more than once. Have trouble breathing. Summary Hypertension is another name for high blood pressure. High blood pressure forces your heart to work harder to pump blood. For most people, a normal blood pressure is less than 120/80. Making healthy choices can help lower blood pressure. If your blood pressure does not get lower with healthy choices, you may need to take medicine. This information is not intended to replace advice given to you by your health care provider. Make sure you discuss any questions you have with your health care provider. Document Revised: 06/01/2018 Document Reviewed: 06/01/2018 Elsevier Patient Education  2022 [employer].  Patient agreed to services and verbal consent obtained.   Patient verbalizes understanding of instructions provided today and agrees to  view in [place].   Telephone follow up appointment with care management team member scheduled for:  Peter Garter RN, [facility], CDE Care Management Coordinator [facility] [phone], Mobile [phone]   CLINICAL CARE PLAN: Patient Care Plan: Diabetes Type 2 (Adult)     Problem Identified: Lack of long term self management of Type 2 Diabetes   Priority: High     Long-Range Goal: Effective long term self management of Type 2 Diabetes   Start Date: 06/12/2021  Expected End Date: 12/03/2021  This Visit's Progress: On track  Priority: High  Note:   Objective:  Lab Results  Component Value Date   HGBA1C 10.9 (H) 04/30/2021   Lab Results  Component Value Date   CREATININE 0.42 04/30/2021   CREATININE 0.64 12/23/2020   CREATININE 0.59 (L) 05/10/2020   No results found for: EGFR Current  Barriers:  Knowledge Deficits related to basic Diabetes pathophysiology and self care/management Knowledge Deficits related to medications used for management of diabetes Unable to independently self manage Type 2 Diabetes Does not adhere to provider recommendations re: diet States he has been taking his meal time insulin and the [place] as ordered.  States he is checking his CBG twice a day States that his fasting readings have ranged from 130-190 and his readings around 5 PM are from 190-330,  Denies any hypoglycemia.  States he is trying to eat less bread and more protein.  States he is drinking 2 Ensures a day to try to gain some of his weight back.  States he has not drank any alcohol since he was in the hospital in July for DKA.  States he is trying to not drink sodas but he does sometimes drink apple juice.  States he is waiting to get the Dexcom continuous glucose monitor.   Case Manager Clinical Goal(s):  patient will demonstrate improved adherence to prescribed treatment plan for diabetes self care/management as evidenced by: daily monitoring and recording of CBG   adherence to ADA/ carb modified diet adherence to prescribed medication regimen contacting provider for new or worsened symptoms or questions Interventions:  Collaboration with Panosh, Standley Brooking, MD regarding development and update of comprehensive plan of care as evidenced by provider attestation and co-signature Inter-disciplinary care team collaboration (see longitudinal plan of care) Provided education to patient about basic DM disease process Reviewed medications with patient and discussed importance of medication adherence Discussed plans with patient for ongoing care management follow up and provided patient with direct contact information for care management team Provided patient with written educational materials related to hypo and hyperglycemia and importance of correct treatment Reviewed scheduled/upcoming provider appointments including: Dr.Carver GI 07/09/21, Dr. Kelton Pillar 08/22/21, Dr. Regis Bill 08/25/21 Advised patient, providing education and rationale, to check cbg twice a day and record, calling provider for findings outside established parameters.   Review of patient status, including review of consultants reports, relevant laboratory and other test results, and medications completed. Reviewed to try drinking diabetic nutritional supplements to help gain weight Reviewed to avoid drinking sugar sweetened drinks  Self-Care Activities - Self administers oral medications as prescribed Self administers insulin as prescribed Attends all scheduled provider appointments Checks blood sugars as prescribed and utilize hyper and hypoglycemia protocol as needed Adheres to prescribed ADA/carb modified Patient Goals: - check blood sugar at prescribed times - check blood sugar if I feel it is too high or too low - enter blood sugar readings and medication or insulin into daily log - take the blood sugar log to all doctor visits - take the blood sugar meter to all doctor visits - change to  whole grain breads, cereal, pasta - drink 6 to 8 glasses of water each day - fill half of plate with vegetables - limit fast food meals to no more than 1 per week - manage portion size - prepare main meal at home 3 to 5 days each week - read food labels for fat, fiber, carbohydrates and portion size - switch to low-fat or skim milk - switch to sugar-free drinks - keep appointment with eye doctor - check feet daily for cuts, sores or redness - keep feet up while sitting - trim toenails straight across - wash and dry feet carefully every day - wear comfortable, cotton socks - wear comfortable, well-fitting shoes -Switch to diabetic nutritional supplements Follow Up Plan: Telephone follow up appointment with  care management team member scheduled for: 106/22 at 3:30 PM The patient has been provided with contact information for the care management team and has been advised to call with any health related questions or concerns.      Patient Care Plan: Cardiovascular disease  (HTN and HLD)     Problem Identified: Need for self management of Cardiovascular disease  (HTN and HLD)   Priority: Medium     Long-Range Goal: Effective self managment of Cardiovascular disease (HTN and HLD)   Start Date: 06/12/2021  Expected End Date: 12/03/2021  This Visit's Progress: On track  Priority: Medium  Note:   Current Barriers:  Knowledge Deficits related to basic understanding of Cardiovascular disease (HTN and HLD) pathophysiology and self care management Unable to independently self manage Cardiovascular disease (HTN and HLD) Nurse Case Manager Clinical Goal(s):  patient will verbalize understanding of plan for Cardiovascular disease (HTN and HLD) management patient will not experience hospital admission. Hospital Admissions in last 6 months = 1 patient will attend all scheduled medical appointments: Dr.Carver GI 07/09/21, Dr. Kelton Pillar 08/22/21, Dr. Regis Bill 08/25/21 patient will demonstrate improved  adherence to prescribed treatment plan for hypertension as evidenced by taking all medications as prescribed, monitoring and recording blood pressure as directed, adhering to low sodium/DASH diet patient will demonstrate improved health management independence as evidenced by checking blood pressure as directed and notifying PCP if SBP>160 or DBP > 90, taking all medications as prescribe, and adhering to a low sodium diet as discussed. patient will verbalize basic understanding of Cardiovascular disease (HTN and HLD) disease process and self health management plan as evidenced by readings within limits, adherence to plan of care Interventions:  Collaboration with Panosh, Standley Brooking, MD regarding development and update of comprehensive plan of care as evidenced by provider attestation and co-signature Inter-disciplinary care team collaboration (see longitudinal plan of care) Evaluation of current treatment plan related to hypertension self management and patient's adherence to plan as established by provider. Provided education to patient re: stroke prevention, s/s of heart attack and stroke, DASH diet, complications of uncontrolled blood pressure Reviewed medications with patient and discussed importance of compliance Discussed plans with patient for ongoing care management follow up and provided patient with direct contact information for care management team Advised patient, providing education and rationale, to monitor blood pressure daily and record, calling PCP for findings outside established parameters.  Reviewed scheduled/upcoming provider appointments including: Dr.Carver GI 07/09/21, Dr. Kelton Pillar 08/22/21, Dr. Regis Bill 08/25/21 Reviewed to avoid saturated fats, trans-fats and eat more fiber Reviewed to take statin as ordered Reviewed to lower fatty foods, red meat, cheese, milk and increase fiber like whole grains and veggies. Self-Care Activities:  Patient verbalizes understanding of plan to  self manage Cardiovascular disease (HTN and HLD)  Self administers medications as prescribed Attends all scheduled provider appointments Calls pharmacy for medication refills Calls provider office for new concerns or questions Patient Goals  - Self administer medications as prescribed  - Attend all scheduled provider appointments  - Call provider office for new concerns, questions, or BP outside discussed parameters  - Check BP and record as discussed  - Follows a low sodium diet/DASH diet - check blood pressure daily - choose a place to take my blood pressure (home, clinic or office, retail store) - write blood pressure results in a log or diary - ask questions to understand - learn about high blood pressure - change to whole grain breads, cereal, pasta - drink 6 to 8 glasses of  water each day - fill half of plate with vegetables - limit fast food meals to no more than 1 per week - manage portion size - prepare main meal at home 3 to 5 days each week - read food labels for fat, fiber, carbohydrates and portion size - switch to low-fat or skim milk - switch to sugar-free drinks Follow Up Plan: Telephone follow up appointment with care management team member scheduled for: 07/10/21 at 3:30 PM The patient has been provided with contact information for the care management team and has been advised to call with any health related questions or concerns.

## 2021-06-13 NOTE — Telephone Encounter (Signed)
Takes 5 injections daily, dexcom, says [facility] doesn't have enough "information". Has patient down for 2 injections, but takes more. Pharmacy cancelled order saying pt doesn't take enough insulin, which is incorrect. Please call pt

## 2021-06-30 ENCOUNTER — Encounter (HOSPITAL_COMMUNITY): Payer: Self-pay | Admitting: Emergency Medicine

## 2021-06-30 ENCOUNTER — Other Ambulatory Visit: Payer: Self-pay

## 2021-06-30 ENCOUNTER — Inpatient Hospital Stay (HOSPITAL_COMMUNITY)
Admission: EM | Admit: 2021-06-30 | Discharge: 2021-07-03 | DRG: 637 | Disposition: A | Discharge status: Home / Self Care | Payer: Medicare Other | Attending: Family Medicine | Admitting: Family Medicine

## 2021-06-30 ENCOUNTER — Emergency Department (HOSPITAL_COMMUNITY): Payer: Medicare Other

## 2021-06-30 DIAGNOSIS — Z8719 Personal history of other diseases of the digestive system: Secondary | ICD-10-CM

## 2021-06-30 DIAGNOSIS — E43 Unspecified severe protein-calorie malnutrition: Secondary | ICD-10-CM | POA: Diagnosis present

## 2021-06-30 DIAGNOSIS — E1110 Type 2 diabetes mellitus with ketoacidosis without coma: Principal | ICD-10-CM | POA: Diagnosis present

## 2021-06-30 DIAGNOSIS — Z681 Body mass index (BMI) 19 or less, adult: Secondary | ICD-10-CM

## 2021-06-30 DIAGNOSIS — I10 Essential (primary) hypertension: Secondary | ICD-10-CM | POA: Diagnosis present

## 2021-06-30 DIAGNOSIS — E871 Hypo-osmolality and hyponatremia: Secondary | ICD-10-CM

## 2021-06-30 DIAGNOSIS — T68XXXA Hypothermia, initial encounter: Secondary | ICD-10-CM

## 2021-06-30 DIAGNOSIS — Z8249 Family history of ischemic heart disease and other diseases of the circulatory system: Secondary | ICD-10-CM

## 2021-06-30 DIAGNOSIS — E876 Hypokalemia: Secondary | ICD-10-CM | POA: Diagnosis not present

## 2021-06-30 DIAGNOSIS — E8729 Other acidosis: Secondary | ICD-10-CM

## 2021-06-30 DIAGNOSIS — N179 Acute kidney failure, unspecified: Secondary | ICD-10-CM | POA: Diagnosis not present

## 2021-06-30 DIAGNOSIS — Z8261 Family history of arthritis: Secondary | ICD-10-CM

## 2021-06-30 DIAGNOSIS — E872 Acidosis: Secondary | ICD-10-CM

## 2021-06-30 DIAGNOSIS — U071 COVID-19: Secondary | ICD-10-CM | POA: Diagnosis present

## 2021-06-30 DIAGNOSIS — Z720 Tobacco use: Secondary | ICD-10-CM

## 2021-06-30 DIAGNOSIS — F1010 Alcohol abuse, uncomplicated: Secondary | ICD-10-CM | POA: Diagnosis present

## 2021-06-30 DIAGNOSIS — J40 Bronchitis, not specified as acute or chronic: Secondary | ICD-10-CM | POA: Insufficient documentation

## 2021-06-30 DIAGNOSIS — Z981 Arthrodesis status: Secondary | ICD-10-CM

## 2021-06-30 DIAGNOSIS — E869 Volume depletion, unspecified: Secondary | ICD-10-CM | POA: Diagnosis present

## 2021-06-30 DIAGNOSIS — K219 Gastro-esophageal reflux disease without esophagitis: Secondary | ICD-10-CM | POA: Diagnosis present

## 2021-06-30 DIAGNOSIS — Z9114 Patient's other noncompliance with medication regimen: Secondary | ICD-10-CM

## 2021-06-30 DIAGNOSIS — E875 Hyperkalemia: Secondary | ICD-10-CM | POA: Diagnosis present

## 2021-06-30 DIAGNOSIS — M1990 Unspecified osteoarthritis, unspecified site: Secondary | ICD-10-CM | POA: Diagnosis present

## 2021-06-30 DIAGNOSIS — R112 Nausea with vomiting, unspecified: Secondary | ICD-10-CM

## 2021-06-30 DIAGNOSIS — Z79899 Other long term (current) drug therapy: Secondary | ICD-10-CM

## 2021-06-30 DIAGNOSIS — Z833 Family history of diabetes mellitus: Secondary | ICD-10-CM

## 2021-06-30 DIAGNOSIS — Z794 Long term (current) use of insulin: Secondary | ICD-10-CM

## 2021-06-30 DIAGNOSIS — R739 Hyperglycemia, unspecified: Secondary | ICD-10-CM

## 2021-06-30 DIAGNOSIS — K760 Fatty (change of) liver, not elsewhere classified: Secondary | ICD-10-CM | POA: Diagnosis present

## 2021-06-30 DIAGNOSIS — R680 Hypothermia, not associated with low environmental temperature: Secondary | ICD-10-CM | POA: Diagnosis present

## 2021-06-30 DIAGNOSIS — F17210 Nicotine dependence, cigarettes, uncomplicated: Secondary | ICD-10-CM | POA: Diagnosis present

## 2021-06-30 DIAGNOSIS — D696 Thrombocytopenia, unspecified: Secondary | ICD-10-CM | POA: Diagnosis present

## 2021-06-30 DIAGNOSIS — Z7984 Long term (current) use of oral hypoglycemic drugs: Secondary | ICD-10-CM

## 2021-06-30 DIAGNOSIS — E0810 Diabetes mellitus due to underlying condition with ketoacidosis without coma: Secondary | ICD-10-CM

## 2021-06-30 DIAGNOSIS — E785 Hyperlipidemia, unspecified: Secondary | ICD-10-CM | POA: Diagnosis present

## 2021-06-30 DIAGNOSIS — K863 Pseudocyst of pancreas: Secondary | ICD-10-CM | POA: Diagnosis present

## 2021-06-30 DIAGNOSIS — R627 Adult failure to thrive: Secondary | ICD-10-CM

## 2021-06-30 DIAGNOSIS — D6959 Other secondary thrombocytopenia: Secondary | ICD-10-CM | POA: Diagnosis present

## 2021-06-30 HISTORY — DX: Type 2 diabetes mellitus with ketoacidosis without coma: E11.10

## 2021-06-30 LAB — CBC WITH DIFFERENTIAL/PLATELET
Abs Immature Granulocytes: 0.06 10*3/uL (ref 0.00–0.07)
Basophils Absolute: 0 10*3/uL (ref 0.0–0.1)
Basophils Relative: 0 %
Eosinophils Absolute: 0 10*3/uL (ref 0.0–0.5)
Eosinophils Relative: 0 %
HCT: 50.5 % (ref 39.0–52.0)
Hemoglobin: 15.5 g/dL (ref 13.0–17.0)
Immature Granulocytes: 1 %
Lymphocytes Relative: 4 %
Lymphs Abs: 0.3 10*3/uL — ABNORMAL LOW (ref 0.7–4.0)
MCH: 30.5 pg (ref 26.0–34.0)
MCHC: 30.7 g/dL (ref 30.0–36.0)
MCV: 99.2 fL (ref 80.0–100.0)
Monocytes Absolute: 0.4 10*3/uL (ref 0.1–1.0)
Monocytes Relative: 5 %
Neutro Abs: 6.3 10*3/uL (ref 1.7–7.7)
Neutrophils Relative %: 90 %
Platelets: 55 10*3/uL — ABNORMAL LOW (ref 150–400)
RBC: 5.09 MIL/uL (ref 4.22–5.81)
RDW: 11.9 % (ref 11.5–15.5)
WBC: 7 10*3/uL (ref 4.0–10.5)
nRBC: 0 % (ref 0.0–0.2)

## 2021-06-30 LAB — CBG MONITORING, ED
Glucose-Capillary: >600 mg/dL (ref 70–99)
Glucose-Capillary: 503 mg/dL (ref 70–99)
Glucose-Capillary: 550 mg/dL (ref 70–99)
Glucose-Capillary: 416 mg/dL — ABNORMAL HIGH (ref 70–99)
Glucose-Capillary: 382 mg/dL — ABNORMAL HIGH (ref 70–99)

## 2021-06-30 LAB — URINALYSIS, ROUTINE W REFLEX MICROSCOPIC
Bacteria, UA: NONE SEEN
Bilirubin Urine: NEGATIVE
Glucose, UA: >=500 mg/dL — AB
Ketones, ur: 80 mg/dL — AB
Leukocytes,Ua: NEGATIVE
Nitrite: NEGATIVE
Protein, ur: 30 mg/dL — AB
Specific Gravity, Urine: 1.017 (ref 1.005–1.030)
pH: 5 (ref 5.0–8.0)

## 2021-06-30 LAB — COMPREHENSIVE METABOLIC PANEL
ALT: 27 U/L (ref 0–44)
AST: 17 U/L (ref 15–41)
Albumin: 4.7 g/dL (ref 3.5–5.0)
Alkaline Phosphatase: 84 U/L (ref 38–126)
BUN: 32 mg/dL — ABNORMAL HIGH (ref 6–20)
CO2: <7 mmol/L — ABNORMAL LOW (ref 22–32)
Calcium: 9.6 mg/dL (ref 8.9–10.3)
Chloride: 95 mmol/L — ABNORMAL LOW (ref 98–111)
Creatinine, Ser: 1.79 mg/dL — ABNORMAL HIGH (ref 0.61–1.24)
GFR, Estimated: 46 mL/min — ABNORMAL LOW (ref >60)
Glucose, Bld: 726 mg/dL (ref 70–99)
Potassium: 5.6 mmol/L — ABNORMAL HIGH (ref 3.5–5.1)
Sodium: 129 mmol/L — ABNORMAL LOW (ref 135–145)
Total Bilirubin: 2 mg/dL — ABNORMAL HIGH (ref 0.3–1.2)
Total Protein: 8.2 g/dL — ABNORMAL HIGH (ref 6.5–8.1)

## 2021-06-30 LAB — GLUCOSE, CAPILLARY
Glucose-Capillary: 156 mg/dL — ABNORMAL HIGH (ref 70–99)
Glucose-Capillary: 163 mg/dL — ABNORMAL HIGH (ref 70–99)
Glucose-Capillary: 211 mg/dL — ABNORMAL HIGH (ref 70–99)

## 2021-06-30 LAB — BLOOD GAS, VENOUS
Acid-base deficit: 22.2 mmol/L — ABNORMAL HIGH (ref 0.0–2.0)
Bicarbonate: 8.3 mmol/L — ABNORMAL LOW (ref 20.0–28.0)
FIO2: 21
O2 Saturation: 76.4 %
Patient temperature: 35.9
pCO2, Ven: 20.3 mmHg — ABNORMAL LOW (ref 44.0–60.0)
pH, Ven: 7.09 — CL (ref 7.250–7.430)
pO2, Ven: 49.9 mmHg — ABNORMAL HIGH (ref 32.0–45.0)

## 2021-06-30 LAB — RESP PANEL BY RT-PCR (FLU A&B, COVID) ARPGX2
Influenza A by PCR: NEGATIVE
Influenza B by PCR: NEGATIVE
SARS Coronavirus 2 by RT PCR: POSITIVE — AB

## 2021-06-30 LAB — I-STAT CHEM 8, ED
BUN: 35 mg/dL — ABNORMAL HIGH (ref 6–20)
Calcium, Ion: 1.14 mmol/L — ABNORMAL LOW (ref 1.15–1.40)
Chloride: 105 mmol/L (ref 98–111)
Creatinine, Ser: 1.3 mg/dL — ABNORMAL HIGH (ref 0.61–1.24)
Glucose, Bld: >700 mg/dL (ref 70–99)
HCT: 49 % (ref 39.0–52.0)
Hemoglobin: 16.7 g/dL (ref 13.0–17.0)
Potassium: 5.8 mmol/L — ABNORMAL HIGH (ref 3.5–5.1)
Sodium: 127 mmol/L — ABNORMAL LOW (ref 135–145)
TCO2: 8 mmol/L — ABNORMAL LOW (ref 22–32)

## 2021-06-30 LAB — ETHANOL: Alcohol, Ethyl (B): <10 mg/dL (ref <10)

## 2021-06-30 LAB — BETA-HYDROXYBUTYRIC ACID: Beta-Hydroxybutyric Acid: >8 mmol/L — ABNORMAL HIGH (ref 0.05–0.27)

## 2021-06-30 LAB — MRSA NEXT GEN BY PCR, NASAL: MRSA by PCR Next Gen: NOT DETECTED

## 2021-06-30 MED ORDER — FAMOTIDINE 20 MG PO TABS: 20.0000 mg | ORAL_TABLET | Freq: Once | ORAL | Status: DC

## 2021-06-30 MED ORDER — DEXTROSE IN LACTATED RINGERS 5 % IV SOLN: INTRAVENOUS | Status: DC

## 2021-06-30 MED ORDER — SODIUM CHLORIDE 0.9 % IV BOLUS
1000.0000 mL | Freq: Once | INTRAVENOUS | Status: AC
  Administered 2021-06-30: 1000 mL via INTRAVENOUS

## 2021-06-30 MED ORDER — THIAMINE HCL 100 MG PO TABS
100.0000 mg | ORAL_TABLET | Freq: Every day | ORAL | Status: DC
  Administered 2021-07-01 – 2021-07-03 (×2): 100 mg via ORAL
  Filled 2021-06-30 (×3): qty 1

## 2021-06-30 MED ORDER — PANTOPRAZOLE SODIUM 40 MG PO TBEC
40.0000 mg | DELAYED_RELEASE_TABLET | Freq: Every day | ORAL | Status: DC
  Administered 2021-07-01 – 2021-07-03 (×3): 40 mg via ORAL
  Filled 2021-06-30 (×3): qty 1

## 2021-06-30 MED ORDER — FOLIC ACID 1 MG PO TABS
1.0000 mg | ORAL_TABLET | Freq: Every day | ORAL | Status: DC
  Administered 2021-07-01 – 2021-07-03 (×3): 1 mg via ORAL
  Filled 2021-06-30 (×3): qty 1

## 2021-06-30 MED ORDER — LORAZEPAM 1 MG PO TABS
1.0000 mg | ORAL_TABLET | ORAL | Status: DC | PRN
  Administered 2021-07-02: 1 mg via ORAL
  Filled 2021-06-30: qty 1

## 2021-06-30 MED ORDER — DEXTROSE 50 % IV SOLN: 0.0000 mL | INTRAVENOUS | Status: DC | PRN

## 2021-06-30 MED ORDER — INSULIN REGULAR(HUMAN) IN NACL 100-0.9 UT/100ML-% IV SOLN
INTRAVENOUS | Status: DC
  Administered 2021-06-30: 10.5 [IU]/h via INTRAVENOUS
  Filled 2021-06-30: qty 100

## 2021-06-30 MED ORDER — LACTATED RINGERS IV SOLN: INTRAVENOUS | Status: DC

## 2021-06-30 MED ORDER — LORAZEPAM 2 MG/ML IJ SOLN: 1.0000 mg | INTRAMUSCULAR | Status: DC | PRN

## 2021-06-30 MED ORDER — PANTOPRAZOLE SODIUM 40 MG IV SOLR
40.0000 mg | Freq: Once | INTRAVENOUS | Status: AC
  Administered 2021-06-30: 40 mg via INTRAVENOUS
  Filled 2021-06-30: qty 40

## 2021-06-30 MED ORDER — ADULT MULTIVITAMIN W/MINERALS CH
1.0000 | ORAL_TABLET | Freq: Every day | ORAL | Status: DC
  Administered 2021-07-01 – 2021-07-03 (×3): 1 via ORAL
  Filled 2021-06-30 (×3): qty 1

## 2021-06-30 MED ORDER — ENSURE ENLIVE PO LIQD
237.0000 mL | Freq: Two times a day (BID) | ORAL | Status: DC
  Administered 2021-07-02 – 2021-07-03 (×2): 237 mL via ORAL

## 2021-06-30 MED ORDER — SODIUM CHLORIDE 0.9 % IV BOLUS
2000.0000 mL | Freq: Once | INTRAVENOUS | Status: AC
  Administered 2021-06-30: 2000 mL via INTRAVENOUS

## 2021-06-30 MED ORDER — THIAMINE HCL 100 MG/ML IJ SOLN
100.0000 mg | Freq: Every day | INTRAMUSCULAR | Status: DC
  Administered 2021-07-02: 100 mg via INTRAVENOUS
  Filled 2021-06-30: qty 2

## 2021-06-30 NOTE — ED Triage Notes (Signed)
Pt reports SHOB, abdominal pain, and weakness that started 2 days ago.

## 2021-06-30 NOTE — ED Notes (Signed)
Date and time results received: 06/30/21 1834   Test: [facility] Critical Value: 7.09  Name of Provider Notified: Dr. Roderic Palau  Orders Received? Or Actions Taken?: Notified

## 2021-06-30 NOTE — H&P (Signed)
History and Physical  Todd Rodriguez HCW:[id number] DOB: November 14, 1975 DOA: 06/30/2021  Referring physician: Milton Ferguson, MD PCP: Todd Medin, MD  Patient coming from: Home  Chief Complaint: Abdominal pain, elevated blood glucose level  HPI: Todd Rodriguez is a 45 y.o. male with medical history significant for type 2 diabetes mellitus, hypertension, GERD, alcohol abuse and tobacco use who presents to the emergency department due to 2-day onset of shortness of breath and elevated blood glucose level.  Patient states that he has not eaten in 3 days and he complained of nausea and vomiting x3 which was associated with shortness of breath and gait abnormality 3 days ago (9/23), he has a history of alcohol abuse and endorsed drinking minimum of half a gallon weekly and at least 12 pack of beer weekly with last consumption being 3 days ago.  He denies withdrawal symptoms when he stops drinking alcohol.  Patient states that he has not been compliant with insulin use.  He states that he obtain COVID vaccines and booster and denies any sick contact.  ED Course:  In the emergency department, temperature was 96.29F on arrival to ED.  He was tachycardic, but other vital signs are within normal range.  Work-up in the ED showed normal CBC except for thrombocytopenia, hyponatremia, hyperkalemia, bicarb < 7, BUN/creatinine 32/1.79 (baseline creatinine at 0.4-0.6).  VBG showed pH of 7.090, PCO2 20.3, PO2 49.9, bicarb 8.3 on room air, alcohol level was < 10.  Influenza A, B was negative, SARS coronavirus 2 was positive. Chest x-ray showed no active disease. Patient was started on insulin drip per Endo tool, IV hydration was provided, Protonix was given and hospitalist was asked to admit him for further evaluation and management.  Review of Systems: Constitutional: Negative for chills and fever.  HENT: Negative for ear pain and sore throat.   Eyes: Negative for pain and visual disturbance.  Respiratory: Positive  for shortness of breath (resolved).  Negative for cough   Cardiovascular: Negative for chest pain and palpitations.  Gastrointestinal: Positive for nausea and vomiting (resolved).  Negative for abdominal pain Endocrine: Negative for polyphagia and polyuria.  Genitourinary: Negative for decreased urine volume, dysuria, enuresis Musculoskeletal: Negative for arthralgias and back pain.  Skin: Negative for color change and rash.  Allergic/Immunologic: Negative for immunocompromised state.  Neurological: Positive for weakness.  Negative for tremors, syncope, speech difficulty Hematological: Does not bruise/bleed easily.  All other systems reviewed and are negative  Past Medical History:  Diagnosis Date   Acute pancreatitis 10/25/2013   Arthritis    Diabetes mellitus without complication (HCC)    GERD (gastroesophageal reflux disease)    Hepatic steatosis    History of pancreatitis    Hypertension    Myalgia and myositis 04/21/2014   much better from illness presumed infectious    has disability form  will complete     Pancreatic pseudocyst    Past Surgical History:  Procedure Laterality Date   ANTERIOR CERVICAL DECOMP/DISCECTOMY FUSION N/A 01/12/2018   Procedure: Revision ACDF C4-5, removal of hardware C5-6, exploration of fusion C5-6;  Surgeon: [name], MD;  Location: [place];  Service: Orthopedics;  Laterality: N/A;  3.5 hrs   BIOPSY  05/13/2020   Procedure: BIOPSY;  Surgeon: Eloise Harman, DO;  [facility];  Service: Endoscopy;;   BIOPSY  09/23/2020   Procedure: BIOPSY;  Surgeon: Eloise Harman, DO;  [facility];  Service: Endoscopy;;   COLONOSCOPY WITH PROPOFOL N/A 05/13/2020   Dr.  Carver: Nonbleeding internal hemorrhoids.  Few small mouth diverticula in the sigmoid colon.  2 cecal polyps, inflammatory.  Sigmoid colon polyp hyperplastic.  Localized area of granular mucosa in the transverse colon, status post biopsy which was unremarkable.  No microscopic  colitis.  Plans for repeat colonoscopy in 5 years.   ESOPHAGEAL BRUSHING  05/13/2020   Procedure: ESOPHAGEAL BRUSHING;  Surgeon: Eloise Harman, DO;  [facility];  Service: Endoscopy;;  mid esophagus   ESOPHAGOGASTRODUODENOSCOPY (EGD) WITH PROPOFOL N/A 05/13/2020   Dr. Abbey Chatters: Moderately severe esophagitis with no bleeding found in the middle third of the esophagus, suspicious for Candida esophagitis.  KOH was negative. Gastritis, duodenal polyp.  Duodenal biopsy benign.  Duodenal bulb biopsy with Brunner's gland hyperplasia and mild changes suggestive of peptic injury.  No celiac disease seen.  GE junction biopsy showed GERD with mild inflammation consisten   ESOPHAGOGASTRODUODENOSCOPY (EGD) WITH PROPOFOL N/A 09/23/2020   Procedure: ESOPHAGOGASTRODUODENOSCOPY (EGD) WITH PROPOFOL;  Surgeon: Eloise Harman, DO;  [facility];  Service: Endoscopy;  Laterality: N/A;  7:30am   NECK SURGERY     2 disc   POLYPECTOMY  05/13/2020   Procedure: POLYPECTOMY;  Surgeon: Eloise Harman, DO;  [facility];  Service: Endoscopy;;   SPINAL FUSION      Social History:  reports that he has been smoking cigarettes. He has a 12.00 pack-year smoking history. He has never used smokeless tobacco. He reports current alcohol use. He reports that he does not use drugs.   No Known Allergies  Family History  Problem Relation Age of Onset   Diabetes Mother    Hypertension Mother    Diabetes Father    Hypertension Father    Arthritis Maternal Grandmother    Hypertension Maternal Grandfather    Diabetes Maternal Grandfather    Colon cancer Neg Hx    Pancreatitis Neg Hx    Pancreatic cancer Neg Hx      Prior to Admission medications   Medication Sig Start Date End Date Taking? Authorizing Provider  albuterol (VENTOLIN HFA) 108 (90 Base) MCG/ACT inhaler TAKE 2 PUFFS BY MOUTH EVERY 6 HOURS AS NEEDED FOR WHEEZE OR SHORTNESS OF BREATH Patient taking differently: Inhale 2 puffs into  the lungs every 6 (six) hours as needed for wheezing or shortness of breath. 05/04/19   Panosh, Standley Brooking, MD  amlodipine-benazepril (LOTREL) 2.5-10 MG capsule TAKE 1 CAPSULE BY MOUTH DAILY. FOR HIGH BLOOD PRESSURE .DOSE ADJUSTMENT 02/20/21   Panosh, Standley Brooking, MD  Aspirin-Acetaminophen (GOODYS BODY PAIN PO) Take 1 Package by mouth daily as needed (headache/pain).    [provider]  atorvastatin (LIPITOR) 40 MG tablet TAKE 1 TABLET BY MOUTH EVERY DAY Patient taking differently: Take 40 mg by mouth daily. 07/02/20   Panosh, Standley Brooking, MD  Brimonidine Tartrate (LUMIFY) 0.025 % SOLN Place 1 drop into both eyes daily as needed (eye irritation/dry eyes.).    [provider]  citalopram (CELEXA) 20 MG tablet Take 10 mg per day for 1-2 weeks then increase to 20 mg per day or as directed 12/23/20   Panosh, Standley Brooking, MD  Continuous Blood Gluc Sensor (DEXCOM G6 SENSOR) MISC 1 Device by Does not apply route as directed. 05/19/21   Shamleffer, Melanie Crazier, MD  Continuous Blood Gluc Transmit (DEXCOM G6 TRANSMITTER) MISC 1 Device by Does not apply route as directed. 05/19/21   Shamleffer, Melanie Crazier, MD  Cyanocobalamin (VITAMIN B-12 PO) Take 1 tablet by mouth daily.  [provider]  dicyclomine (BENTYL) 10 MG capsule TAKE 1 CAPSULE FOUR TIMES A DAY BEFORE MEALS AND AT BEDTIME AS NEEDED FOR ABDOMINAL CRAMPING 01/30/21   Erenest Rasher, PA-C  empagliflozin (JARDIANCE) 25 MG TABS tablet Take 1 tablet (25 mg total) by mouth daily. 05/19/21   Shamleffer, Melanie Crazier, MD  folic acid (FOLVITE) 678 MCG tablet Take 800 mcg by mouth daily. 04/23/21   [provider]  gabapentin (NEURONTIN) 300 MG capsule Take 300 mg by mouth 3 (three) times daily.  04/12/19   [provider]  glucose blood (ONE TOUCH TEST STRIPS) test strip Use as instructed 06/25/15   Panosh, Standley Brooking, MD  insulin aspart (NOVOLOG FLEXPEN) 100 UNIT/ML FlexPen Inject 6 Units into the skin 3 (three) times daily with  meals. 05/19/21   Shamleffer, Melanie Crazier, MD  Insulin Pen Needle 32G X 4 MM MISC 1 Device by Does not apply route in the morning, at noon, in the evening, and at bedtime. 05/19/21   Shamleffer, Melanie Crazier, MD  KLOR-CON M20 20 MEQ tablet Take 40 mEq by mouth 2 (two) times daily. 04/23/21   [provider]  lipase/protease/amylase (CREON) 36000 UNITS CPEP capsule Take two with meals and one with snacks (total of 8 per day) Patient taking differently: Take 36,000-72,000 Units by mouth See admin instructions. Take two with meals and one with snacks (total of 8 per day) 08/27/20   Mahala Menghini, PA-C  LORazepam (ATIVAN) 1 MG tablet Take 1 tablet (1 mg total) by mouth every 8 (eight) hours as needed for anxiety. Panic attacks 05/24/18   Panosh, Standley Brooking, MD  magnesium oxide (MAG-OX) 400 (240 Mg) MG tablet Take 1 tablet by mouth daily. 04/23/21   [provider]  ondansetron (ZOFRAN ODT) 4 MG disintegrating tablet Take 1 tablet (4 mg total) by mouth every 8 (eight) hours as needed for nausea or vomiting. 01/12/18   Mayo, Darla Lesches, PA-C  pantoprazole (PROTONIX) 40 MG tablet Take 1 tablet (40 mg total) by mouth daily. 05/13/21   Mahala Menghini, PA-C  thiamine 100 MG tablet Take 100 mg by mouth daily. 04/23/21   [provider]  TRESIBA FLEXTOUCH 100 UNIT/ML FlexTouch Pen Inject 18 Units into the skin daily. 05/19/21   Shamleffer, Melanie Crazier, MD    Physical Exam: BP 126/87 (BP Location: Left Arm)   Pulse (!) 119   Temp (!) 97.5 F (36.4 C) (Oral)   Resp 18   Ht 6' 2"  (1.88 m)   Wt 63.5 kg   SpO2 100%   BMI 17.97 kg/m   General: 45 y.o. year-old male well developed well nourished in no acute distress.  Alert and oriented x3. HEENT: Dry mucous membrane.  NCAT, EOMI Neck: Supple, trachea medial Cardiovascular: Tachycardia.  Regular rate and rhythm with no rubs or gallops.  No thyromegaly or JVD noted.  No lower extremity edema. 2/4 pulses in all 4  extremities. Respiratory: Clear to auscultation with no wheezes or rales. Good inspiratory effort. Abdomen: Soft, nontender nondistended with normal bowel sounds x4 quadrants. Muskuloskeletal: No cyanosis, clubbing or edema noted bilaterally Neuro: CN II-XII intact, sensation, reflexes intact, no focal neurologic deficits Skin: No ulcerative lesions noted or rashes Psychiatry: Judgement and insight appear normal. Mood is appropriate for condition and setting          Labs on Admission:  Basic Metabolic Panel: Recent Labs  Lab 06/30/21 1706 06/30/21 1722  NA 129* 127*  K 5.6* 5.8*  CL 95* 105  CO2 <7*  --   GLUCOSE 726* >700*  BUN 32* 35*  CREATININE 1.79* 1.30*  CALCIUM 9.6  --    Liver Function Tests: Recent Labs  Lab 06/30/21 1706  AST 17  ALT 27  ALKPHOS 84  BILITOT 2.0*  PROT 8.2*  ALBUMIN 4.7   No results for input(s): LIPASE, AMYLASE in the last 168 hours. No results for input(s): AMMONIA in the last 168 hours. CBC: Recent Labs  Lab 06/30/21 1706 06/30/21 1722  WBC 7.0  --   NEUTROABS 6.3  --   HGB 15.5 16.7  HCT 50.5 49.0  MCV 99.2  --   PLT 55*  --    Cardiac Enzymes: No results for input(s): CKTOTAL, CKMB, CKMBINDEX, TROPONINI in the last 168 hours.  BNP (last 3 results) No results for input(s): BNP in the last 8760 hours.  ProBNP (last 3 results) No results for input(s): PROBNP in the last 8760 hours.  CBG: Recent Labs  Lab 06/30/21 1633 06/30/21 1839 06/30/21 1919 06/30/21 2006 06/30/21 2042  GLUCAP >600* 550* 503* 416* 382*    Radiological Exams on Admission: DG Chest Port 1 View  Result Date: 06/30/2021 CLINICAL DATA:  Pt reports SHOB, abdominal pain, and weakness that started 2 days ago. weak EXAM: PORTABLE CHEST 1 VIEW COMPARISON:  Chest x-ray 10/18/2019, CT chest 05/28/2019 FINDINGS: The heart and mediastinal contours are unchanged. Aortic calcification. No focal consolidation. No pulmonary edema. No pleural effusion. No  pneumothorax. No acute osseous abnormality. IMPRESSION: No active disease. Electronically Signed   By: Iven Finn M.D.   On: 06/30/2021 17:56    EKG: I independently viewed the EKG done and my findings are as followed: Sinus tachycardia at a rate of 122 bpm  Assessment/Plan Present on Admission:  DKA (diabetic ketoacidosis) ([place])  Essential hypertension, benign  GERD (gastroesophageal reflux disease)  Thrombocytopenia (HCC)  Alcohol abuse  Principal Problem:   DKA (diabetic ketoacidosis) ([place]) Active Problems:   Essential hypertension, benign   GERD (gastroesophageal reflux disease)   High anion gap metabolic acidosis   Alcohol abuse   Thrombocytopenia (HCC)   Hyponatremia   Hyperglycemia   Hypothermia   COVID-19 virus infection   Hyperkalemia   AKI (acute kidney injury) (HCC)   Nausea & vomiting   Bronchitis due to tobacco use   Failure to thrive in adult   Starvation ketoacidosis  Anion gap metabolic acidosis secondary to DKA with possible superimposed starvation ketoacidosis Hyperglycemia secondary to poorly controlled type 2 diabetes mellitus VBG showed pH of 7.090, PCO2 20.3, PO2 49.9, bicarb 8.3 Continue insulin drip, IV LR with IV potassium per DKA protocol Transition IV LR to D5 LR when serum glucose reaches 264m/dL Continue serial BMP and VBG Continue to monitor for anion gap closure prior to transitioning patient to subcu insulin Continue NPO  COVID-19 virus infection (incidental finding) SARS coronavirus 2 was positive Patient was in no acute distress and O2 sat was 100% on room air No indication for any antiviral medication at this time Continue to monitor and treat accordingly  Hypothermia (resolved) Temperature was 96.10F, this has since improved since arrival to the ED  Pseudohyponatremia Na 129; corrected sodium based on CBG= 139  Hyperkalemia K+ 5.6, IV hydration was provided Check potassium level on repeat BMP EKG with no  changes  Thrombocytopenia due to unknown cause Platelets 55, continue to monitor platelet levels with morning labs  Acute kidney injury BUN/creatinine 32/1.79 (baseline creatinine at 0.4-0.6 Continue IV  hydration Renally adjust medications, avoid nephrotoxic agents/dehydration/hypotension  Essential hypertension (controlled) BP meds will be held at this time due to soft BP  GERD Continue Protonix  Alcohol abuse Alcohol level was < 10 Patient will be placed on CIWA protocol due to history of alcohol abuse  Failure to thrive in an adult BMI at 15.99 Protein supplement will be provided Dietitian will be consulted and we shall await further recommendation  Tobacco abuse Patient was counseled on tobacco abuse cessation    DVT prophylaxis: SCDs  Code Status: Full code  Family Communication: Mom at bedside (all questions answered to satisfaction)  Disposition Plan:  Patient is from:                        home Anticipated DC to:                   SNF or family members home Anticipated DC date:               2-3 days Anticipated DC barriers:          Patient requires inpatient management due to DKA with high anion gap metabolic acidosis requiring inpatient treatment  Consults called: None  Admission status: Observation    Bernadette Hoit MD [facility]   06/30/2021, 8:47 PM

## 2021-06-30 NOTE — ED Provider Notes (Signed)
[facility] EMERGENCY DEPARTMENT Provider Note   CSN: [id number] Arrival date & time: 06/30/21  1538     History Chief Complaint  Patient presents with   Shortness of Breath    Todd Rodriguez is a 45 y.o. male.  Patient complains of shortness of breath.  Patient has a history of diabetes and is not taking his medicine.  He also has history of EtOH abuse  The history is provided by the patient and medical records. No language interpreter was used.  Shortness of Breath Severity:  Moderate Onset quality:  Sudden Timing:  Constant Progression:  Worsening Chronicity:  New Context: activity   Relieved by:  Nothing Worsened by:  Nothing Ineffective treatments:  None tried Associated symptoms: no abdominal pain, no chest pain, no cough, no headaches and no rash       Past Medical History:  Diagnosis Date   Acute pancreatitis 10/25/2013   Arthritis    Diabetes mellitus without complication (HCC)    GERD (gastroesophageal reflux disease)    Hepatic steatosis    History of pancreatitis    Hypertension    Myalgia and myositis 04/21/2014   much better from illness presumed infectious    has disability form  will complete     Pancreatic pseudocyst     Patient Active Problem List   Diagnosis Date Noted   DKA (diabetic ketoacidosis) ([place]) 06/30/2021   Loose stools 08/27/2020   Abnormal weight loss 04/26/2020   Rectal bleeding 04/26/2020   Pancreatic pseudocyst    Protein-calorie malnutrition, severe 05/29/2019   Starvation ketoacidosis 05/28/2019   Alcohol abuse 05/28/2019   Thrombocytopenia (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   Carpal tunnel syndrome of left wrist 12/10/2017   Cervical radiculopathy 11/11/2017   History of fusion of cervical  spine 10/22/2017   Cramps, extremity 06/25/2015   Diabetes mellitus type 2, uncontrolled, without complications 84/16/6063   Hand cramps 01/25/2015   Fever, unspecified 03/26/2014   Hx of acute pancreatitis 01/03/2014   Tobacco dependence 01/03/2014   Other and unspecified hyperlipidemia 01/03/2014   GERD (gastroesophageal reflux disease) 11/22/2013   Diabetes mellitus type 2, controlled, without complications ([place]) 01/60/1093   Essential hypertension, benign 10/25/2013   Leukocytosis, unspecified 10/25/2013    Past Surgical History:  Procedure Laterality Date   ANTERIOR CERVICAL DECOMP/DISCECTOMY FUSION N/A 01/12/2018   Procedure: Revision ACDF C4-5, removal of hardware C5-6, exploration of fusion C5-6;  Surgeon: [name], MD;  Location: [place];  Service: Orthopedics;  Laterality: N/A;  3.5 hrs   BIOPSY  05/13/2020   Procedure: BIOPSY;  Surgeon: Eloise Harman, DO;  [facility];  Service: Endoscopy;;   BIOPSY  09/23/2020   Procedure: BIOPSY;  Surgeon: Eloise Harman, DO;  [facility];  Service: Endoscopy;;   COLONOSCOPY WITH PROPOFOL N/A 05/13/2020   Dr. Abbey Chatters: Nonbleeding internal hemorrhoids.  Few small mouth diverticula in the sigmoid colon.  2 cecal polyps, inflammatory.  Sigmoid colon polyp hyperplastic.  Localized area of granular mucosa in the transverse colon, status post biopsy which was unremarkable.  No microscopic colitis.  Plans for repeat colonoscopy in 5 years.   ESOPHAGEAL BRUSHING  05/13/2020   Procedure: ESOPHAGEAL BRUSHING;  Surgeon: Eloise Harman, DO;  [facility];  Service:  Endoscopy;;  mid esophagus   ESOPHAGOGASTRODUODENOSCOPY (EGD) WITH PROPOFOL N/A 05/13/2020   Dr. Abbey Chatters: Moderately severe esophagitis with no bleeding found in the middle third of the esophagus, suspicious for Candida esophagitis.  KOH was negative. Gastritis, duodenal polyp.  Duodenal biopsy benign.  Duodenal bulb biopsy with Brunner's gland hyperplasia  and mild changes suggestive of peptic injury.  No celiac disease seen.  GE junction biopsy showed GERD with mild inflammation consisten   ESOPHAGOGASTRODUODENOSCOPY (EGD) WITH PROPOFOL N/A 09/23/2020   Procedure: ESOPHAGOGASTRODUODENOSCOPY (EGD) WITH PROPOFOL;  Surgeon: Eloise Harman, DO;  [facility];  Service: Endoscopy;  Laterality: N/A;  7:30am   NECK SURGERY     2 disc   POLYPECTOMY  05/13/2020   Procedure: POLYPECTOMY;  Surgeon: Eloise Harman, DO;  [facility];  Service: Endoscopy;;   SPINAL FUSION         Family History  Problem Relation Age of Onset   Diabetes Mother    Hypertension Mother    Diabetes Father    Hypertension Father    Arthritis Maternal Grandmother    Hypertension Maternal Grandfather    Diabetes Maternal Grandfather    Colon cancer Neg Hx    Pancreatitis Neg Hx    Pancreatic cancer Neg Hx     Social History   Tobacco Use   Smoking status: Every Day    Packs/day: 1.00    Years: 12.00    Pack years: 12.00    Types: Cigarettes   Smokeless tobacco: Never  Vaping Use   Vaping Use: Never used  Substance Use Topics   Alcohol use: Yes    Comment: every weekend, one drink. years ago drank more regularly   Drug use: No    Home Medications Prior to Admission medications   Medication Sig Start Date End Date Taking? Authorizing Provider  albuterol (VENTOLIN HFA) 108 (90 Base) MCG/ACT inhaler TAKE 2 PUFFS BY MOUTH EVERY 6 HOURS AS NEEDED FOR WHEEZE OR SHORTNESS OF BREATH Patient taking differently: Inhale 2 puffs into the lungs every 6 (six) hours as needed for wheezing or shortness of breath. 05/04/19   Panosh, Standley Brooking, MD  amlodipine-benazepril (LOTREL) 2.5-10 MG capsule TAKE 1 CAPSULE BY MOUTH DAILY. FOR HIGH BLOOD PRESSURE .DOSE ADJUSTMENT 02/20/21   Panosh, Standley Brooking, MD  Aspirin-Acetaminophen (GOODYS BODY PAIN PO) Take 1 Package by mouth daily as needed (headache/pain).    [provider]  atorvastatin (LIPITOR) 40  MG tablet TAKE 1 TABLET BY MOUTH EVERY DAY Patient taking differently: Take 40 mg by mouth daily. 07/02/20   Panosh, Standley Brooking, MD  Brimonidine Tartrate (LUMIFY) 0.025 % SOLN Place 1 drop into both eyes daily as needed (eye irritation/dry eyes.).    [provider]  citalopram (CELEXA) 20 MG tablet Take 10 mg per day for 1-2 weeks then increase to 20 mg per day or as directed 12/23/20   Panosh, Standley Brooking, MD  Continuous Blood Gluc Sensor (DEXCOM G6 SENSOR) MISC 1 Device by Does not apply route as directed. 05/19/21   Shamleffer, Melanie Crazier, MD  Continuous Blood Gluc Transmit (DEXCOM G6 TRANSMITTER) MISC 1 Device by Does not apply route as directed. 05/19/21   Shamleffer, Melanie Crazier, MD  Cyanocobalamin (VITAMIN B-12 PO) Take 1 tablet by mouth daily.    [provider]  dicyclomine (BENTYL) 10 MG capsule TAKE 1 CAPSULE FOUR TIMES A DAY BEFORE MEALS AND AT BEDTIME AS NEEDED FOR ABDOMINAL CRAMPING 01/30/21   Erenest Rasher, PA-C  empagliflozin (JARDIANCE) 25 MG TABS tablet Take 1 tablet (25 mg total) by mouth daily. 05/19/21   Shamleffer, Melanie Crazier, MD  folic acid (FOLVITE) 725 MCG tablet Take 800 mcg by mouth daily. 04/23/21   [provider]  gabapentin (NEURONTIN) 300 MG capsule Take 300 mg by mouth 3 (three) times daily.  04/12/19   [provider]  glucose blood (ONE TOUCH TEST STRIPS) test strip Use as instructed 06/25/15   Panosh, Standley Brooking, MD  insulin aspart (NOVOLOG FLEXPEN) 100 UNIT/ML FlexPen Inject 6 Units into the skin 3 (three) times daily with meals. 05/19/21   Shamleffer, Melanie Crazier, MD  Insulin Pen Needle 32G X 4 MM MISC 1 Device by Does not apply route in the morning, at noon, in the evening, and at bedtime. 05/19/21   Shamleffer, Melanie Crazier, MD  KLOR-CON M20 20 MEQ tablet Take 40 mEq by mouth 2 (two) times daily. 04/23/21   [provider]  lipase/protease/amylase (CREON) 36000 UNITS CPEP capsule Take two with meals and one with  snacks (total of 8 per day) Patient taking differently: Take 36,000-72,000 Units by mouth See admin instructions. Take two with meals and one with snacks (total of 8 per day) 08/27/20   Mahala Menghini, PA-C  LORazepam (ATIVAN) 1 MG tablet Take 1 tablet (1 mg total) by mouth every 8 (eight) hours as needed for anxiety. Panic attacks 05/24/18   Panosh, Standley Brooking, MD  magnesium oxide (MAG-OX) 400 (240 Mg) MG tablet Take 1 tablet by mouth daily. 04/23/21   [provider]  ondansetron (ZOFRAN ODT) 4 MG disintegrating tablet Take 1 tablet (4 mg total) by mouth every 8 (eight) hours as needed for nausea or vomiting. 01/12/18   Mayo, Darla Lesches, PA-C  pantoprazole (PROTONIX) 40 MG tablet Take 1 tablet (40 mg total) by mouth daily. 05/13/21   Mahala Menghini, PA-C  thiamine 100 MG tablet Take 100 mg by mouth daily. 04/23/21   [provider]  TRESIBA FLEXTOUCH 100 UNIT/ML FlexTouch Pen Inject 18 Units into the skin daily. 05/19/21   Shamleffer, Melanie Crazier, MD    Allergies    Patient has no known allergies.  Review of Systems   Review of Systems  Constitutional:  Negative for appetite change and fatigue.  HENT:  Negative for congestion, ear discharge and sinus pressure.   Eyes:  Negative for discharge.  Respiratory:  Positive for shortness of breath. Negative for cough.   Cardiovascular:  Negative for chest pain.  Gastrointestinal:  Negative for abdominal pain and diarrhea.  Genitourinary:  Negative for frequency and hematuria.  Musculoskeletal:  Negative for back pain.  Skin:  Negative for rash.  Neurological:  Negative for seizures and headaches.  Psychiatric/Behavioral:  Negative for hallucinations.    Physical Exam Updated Vital Signs BP (!) 154/105   Pulse (!) 117   Temp (!) 96.6 F (35.9 C) (Rectal)   Resp 18   Ht 6' 2"  (1.88 m)   Wt 63.5 kg   SpO2 100%   BMI 17.97 kg/m   Physical Exam Vitals and nursing note reviewed.  Constitutional:      Appearance: He  is well-developed.  HENT:     Head: Normocephalic.     Nose: Nose normal.  Eyes:     General: No scleral icterus.    Conjunctiva/sclera: Conjunctivae normal.  Neck:     Thyroid: No thyromegaly.  Cardiovascular:     Rate and Rhythm: Normal rate and regular rhythm.  Heart sounds: No murmur heard.   No friction rub. No gallop.  Pulmonary:     Breath sounds: No stridor. No wheezing or rales.     Comments: Tachypneic Chest:     Chest wall: No tenderness.  Abdominal:     General: There is no distension.     Tenderness: There is no abdominal tenderness. There is no rebound.  Musculoskeletal:        General: Normal range of motion.     Cervical back: Neck supple.  Lymphadenopathy:     Cervical: No cervical adenopathy.  Skin:    Findings: No erythema or rash.  Neurological:     Mental Status: He is alert and oriented to person, place, and time.     Motor: No abnormal muscle tone.     Coordination: Coordination normal.  Psychiatric:        Behavior: Behavior normal.    ED Results / Procedures / Treatments   Labs (all labs ordered are listed, but only abnormal results are displayed) Labs Reviewed  RESP PANEL BY RT-PCR (FLU A&B, COVID) ARPGX2 - Abnormal; Notable for the following components:      Result Value   SARS Coronavirus 2 by RT PCR POSITIVE (*)    All other components within normal limits  CBC WITH DIFFERENTIAL/PLATELET - Abnormal; Notable for the following components:   Platelets 55 (*)    Lymphs Abs 0.3 (*)    All other components within normal limits  COMPREHENSIVE METABOLIC PANEL - Abnormal; Notable for the following components:   Sodium 129 (*)    Potassium 5.6 (*)    Chloride 95 (*)    CO2 <7 (*)    Glucose, Bld 726 (*)    BUN 32 (*)    Creatinine, Ser 1.79 (*)    Total Protein 8.2 (*)    Total Bilirubin 2.0 (*)    GFR, Estimated 46 (*)    All other components within normal limits  BLOOD GAS, VENOUS - Abnormal; Notable for the following components:    pH, Ven 7.090 (*)    pCO2, Ven 20.3 (*)    pO2, Ven 49.9 (*)    Bicarbonate 8.3 (*)    Acid-base deficit 22.2 (*)    All other components within normal limits  CBG MONITORING, ED - Abnormal; Notable for the following components:   Glucose-Capillary >600 (*)    All other components within normal limits  I-STAT CHEM 8, ED - Abnormal; Notable for the following components:   Sodium 127 (*)    Potassium 5.8 (*)    BUN 35 (*)    Creatinine, Ser 1.30 (*)    Glucose, Bld >700 (*)    Calcium, Ion 1.14 (*)    TCO2 8 (*)    All other components within normal limits  CBG MONITORING, ED - Abnormal; Notable for the following components:   Glucose-Capillary 550 (*)    All other components within normal limits  CBG MONITORING, ED - Abnormal; Notable for the following components:   Glucose-Capillary 503 (*)    All other components within normal limits  ETHANOL  BETA-HYDROXYBUTYRIC ACID  BETA-HYDROXYBUTYRIC ACID  URINALYSIS, ROUTINE W REFLEX MICROSCOPIC    EKG None  Radiology DG Chest Port 1 View  Result Date: 06/30/2021 CLINICAL DATA:  Pt reports SHOB, abdominal pain, and weakness that started 2 days ago. weak EXAM: PORTABLE CHEST 1 VIEW COMPARISON:  Chest x-ray 10/18/2019, CT chest 05/28/2019 FINDINGS: The heart and mediastinal contours are unchanged. Aortic calcification.  No focal consolidation. No pulmonary edema. No pleural effusion. No pneumothorax. No acute osseous abnormality. IMPRESSION: No active disease. Electronically Signed   By: Iven Finn M.D.   On: 06/30/2021 17:56    Procedures Procedures   Medications Ordered in ED Medications  insulin regular, human (MYXREDLIN) 100 units/ 100 mL infusion (10.5 Units/hr Intravenous New Bag/Given 06/30/21 1845)  lactated ringers infusion (has no administration in time range)  dextrose 5 % in lactated ringers infusion (has no administration in time range)  dextrose 50 % solution 0-50 mL (has no administration in time range)  sodium  chloride 0.9 % bolus 2,000 mL (2,000 mLs Intravenous New Bag/Given 06/30/21 1737)  sodium chloride 0.9 % bolus 1,000 mL (1,000 mLs Intravenous New Bag/Given 06/30/21 1814)  pantoprazole (PROTONIX) injection 40 mg (40 mg Intravenous Given 06/30/21 1837)    ED Course  I have reviewed the triage vital signs and the nursing notes.  Pertinent labs & imaging results that were available during my care of the patient were reviewed by me and considered in my medical decision making (see chart for details). CRITICAL CARE Performed by: Milton Ferguson Total critical care time: 40 minutes Critical care time was exclusive of separately billable procedures and treating other patients. Critical care was necessary to treat or prevent imminent or life-threatening deterioration. Critical care was time spent personally by me on the following activities: development of treatment plan with patient and/or surrogate as well as nursing, discussions with consultants, evaluation of patient's response to treatment, examination of patient, obtaining history from patient or surrogate, ordering and performing treatments and interventions, ordering and review of laboratory studies, ordering and review of radiographic studies, pulse oximetry and re-evaluation of patient's condition.    MDM Rules/Calculators/A&P                           Patient in DKA.  He is started on insulin drip and will be admitted to medicine.  He also is COVID-positive Final Clinical Impression(s) / ED Diagnoses Final diagnoses:  Diabetic ketoacidosis without coma associated with diabetes mellitus due to underlying condition [facility])    Rx / DC Orders ED Discharge Orders     None        Milton Ferguson, MD 06/30/21 1952

## 2021-06-30 NOTE — ED Notes (Signed)
Date and time results received: 06/30/21 1818  Test: COVID Critical Value: POSITIVE  Name of Provider Notified: Dr.Zammit  Orders Received? Or Actions Taken?: Notified

## 2021-06-30 NOTE — ED Notes (Signed)
Date and time results received: 06/30/21 1744 (use smartphrase ".now" to insert current time Test: glucose >700, K 5.8 Critical Value: Na 127  Name of Provider Notified: Dr. Roderic Palau  Orders Received? Or Actions Taken?:  NA

## 2021-06-30 NOTE — ED Triage Notes (Signed)
Pt has hx of ETOH abuse. Has not drank in 2 days. Poor intake x 10 days. Poor control od diabetes  Pt presents with weakness, tachycardia and sob.

## 2021-06-30 NOTE — ED Notes (Signed)
ED Provider at bedside. 

## 2021-06-30 NOTE — Plan of Care (Signed)
45 year-old AAM admitted 06/30/21 with CC of SOB, weakness. Patient is admitted with a medical diagnosis of "DKA". Patient is also positive for COVID-19. Patient appears cachectic, endorses on-going abdominal pain, weakness, chills; denies SOB. Patient is on Endotool for DKA with q 60 mins CBGs for now.   Problem: Education: Goal: Knowledge of General Education information will improve Description: Including pain rating scale, medication(s)/side effects and non-pharmacologic comfort measures Outcome: Progressing   Problem: Health Behavior/Discharge Planning: Goal: Ability to manage health-related needs will improve Outcome: Progressing   Problem: Clinical Measurements: Goal: Ability to maintain clinical measurements within normal limits will improve Outcome: Progressing Goal: Will remain free from infection Outcome: Progressing Goal: Diagnostic test results will improve Outcome: Progressing Goal: Respiratory complications will improve Outcome: Progressing Goal: Cardiovascular complication will be avoided Outcome: Progressing   Problem: Activity: Goal: Risk for activity intolerance will decrease Outcome: Progressing   Problem: Nutrition: Goal: Adequate nutrition will be maintained Outcome: Progressing   Problem: Coping: Goal: Level of anxiety will decrease Outcome: Progressing   Problem: Elimination: Goal: Will not experience complications related to bowel motility Outcome: Progressing Goal: Will not experience complications related to urinary retention Outcome: Progressing   Problem: Pain Managment: Goal: General experience of comfort will improve Outcome: Progressing   Problem: Safety: Goal: Ability to remain free from injury will improve Outcome: Progressing   Problem: Skin Integrity: Goal: Risk for impaired skin integrity will decrease Outcome: Progressing   Problem: Education: Goal: Ability to describe self-care measures that may prevent or decrease  complications (Diabetes Survival Skills Education) will improve Outcome: Progressing Goal: Individualized Educational Video(s) Outcome: Progressing   Problem: Cardiac: Goal: Ability to maintain an adequate cardiac output will improve Outcome: Progressing   Problem: Health Behavior/Discharge Planning: Goal: Ability to identify and utilize available resources and services will improve Outcome: Progressing Goal: Ability to manage health-related needs will improve Outcome: Progressing   Problem: Fluid Volume: Goal: Ability to achieve a balanced intake and output will improve Outcome: Progressing   Problem: Metabolic: Goal: Ability to maintain appropriate glucose levels will improve Outcome: Progressing   Problem: Nutritional: Goal: Maintenance of adequate nutrition will improve Outcome: Progressing Goal: Maintenance of adequate weight for body size and type will improve Outcome: Progressing   Problem: Respiratory: Goal: Will regain and/or maintain adequate ventilation Outcome: Progressing   Problem: Urinary Elimination: Goal: Ability to achieve and maintain adequate renal perfusion and functioning will improve Outcome: Progressing

## 2021-07-01 DIAGNOSIS — E875 Hyperkalemia: Secondary | ICD-10-CM | POA: Diagnosis present

## 2021-07-01 DIAGNOSIS — N179 Acute kidney failure, unspecified: Secondary | ICD-10-CM | POA: Diagnosis present

## 2021-07-01 DIAGNOSIS — F1010 Alcohol abuse, uncomplicated: Secondary | ICD-10-CM | POA: Diagnosis not present

## 2021-07-01 DIAGNOSIS — E0810 Diabetes mellitus due to underlying condition with ketoacidosis without coma: Secondary | ICD-10-CM

## 2021-07-01 DIAGNOSIS — E43 Unspecified severe protein-calorie malnutrition: Secondary | ICD-10-CM | POA: Diagnosis present

## 2021-07-01 DIAGNOSIS — Z981 Arthrodesis status: Secondary | ICD-10-CM | POA: Diagnosis not present

## 2021-07-01 DIAGNOSIS — E785 Hyperlipidemia, unspecified: Secondary | ICD-10-CM | POA: Diagnosis present

## 2021-07-01 DIAGNOSIS — Z681 Body mass index (BMI) 19 or less, adult: Secondary | ICD-10-CM | POA: Diagnosis not present

## 2021-07-01 DIAGNOSIS — F17210 Nicotine dependence, cigarettes, uncomplicated: Secondary | ICD-10-CM | POA: Diagnosis present

## 2021-07-01 DIAGNOSIS — Z8261 Family history of arthritis: Secondary | ICD-10-CM | POA: Diagnosis not present

## 2021-07-01 DIAGNOSIS — M1990 Unspecified osteoarthritis, unspecified site: Secondary | ICD-10-CM | POA: Diagnosis present

## 2021-07-01 DIAGNOSIS — U071 COVID-19: Secondary | ICD-10-CM | POA: Diagnosis present

## 2021-07-01 DIAGNOSIS — Z8719 Personal history of other diseases of the digestive system: Secondary | ICD-10-CM | POA: Diagnosis not present

## 2021-07-01 DIAGNOSIS — E871 Hypo-osmolality and hyponatremia: Secondary | ICD-10-CM | POA: Diagnosis present

## 2021-07-01 DIAGNOSIS — Z79899 Other long term (current) drug therapy: Secondary | ICD-10-CM | POA: Diagnosis not present

## 2021-07-01 DIAGNOSIS — E1110 Type 2 diabetes mellitus with ketoacidosis without coma: Secondary | ICD-10-CM

## 2021-07-01 DIAGNOSIS — R680 Hypothermia, not associated with low environmental temperature: Secondary | ICD-10-CM | POA: Diagnosis present

## 2021-07-01 DIAGNOSIS — D6959 Other secondary thrombocytopenia: Secondary | ICD-10-CM | POA: Diagnosis present

## 2021-07-01 DIAGNOSIS — K760 Fatty (change of) liver, not elsewhere classified: Secondary | ICD-10-CM | POA: Diagnosis present

## 2021-07-01 DIAGNOSIS — K219 Gastro-esophageal reflux disease without esophagitis: Secondary | ICD-10-CM | POA: Diagnosis present

## 2021-07-01 DIAGNOSIS — E869 Volume depletion, unspecified: Secondary | ICD-10-CM | POA: Diagnosis present

## 2021-07-01 DIAGNOSIS — R627 Adult failure to thrive: Secondary | ICD-10-CM | POA: Diagnosis present

## 2021-07-01 DIAGNOSIS — Z833 Family history of diabetes mellitus: Secondary | ICD-10-CM | POA: Diagnosis not present

## 2021-07-01 DIAGNOSIS — Z8249 Family history of ischemic heart disease and other diseases of the circulatory system: Secondary | ICD-10-CM | POA: Diagnosis not present

## 2021-07-01 DIAGNOSIS — K863 Pseudocyst of pancreas: Secondary | ICD-10-CM | POA: Diagnosis present

## 2021-07-01 DIAGNOSIS — I10 Essential (primary) hypertension: Secondary | ICD-10-CM | POA: Diagnosis present

## 2021-07-01 HISTORY — DX: Type 2 diabetes mellitus with ketoacidosis without coma: E11.10

## 2021-07-01 LAB — BASIC METABOLIC PANEL
Anion gap: 13 (ref 5–15)
Anion gap: 11 (ref 5–15)
Anion gap: 12 (ref 5–15)
Anion gap: 13 (ref 5–15)
Anion gap: 10 (ref 5–15)
Anion gap: 13 (ref 5–15)
BUN: 23 mg/dL — ABNORMAL HIGH (ref 6–20)
BUN: 19 mg/dL (ref 6–20)
BUN: 16 mg/dL (ref 6–20)
BUN: 15 mg/dL (ref 6–20)
BUN: 11 mg/dL (ref 6–20)
BUN: 11 mg/dL (ref 6–20)
CO2: 16 mmol/L — ABNORMAL LOW (ref 22–32)
CO2: 17 mmol/L — ABNORMAL LOW (ref 22–32)
CO2: 19 mmol/L — ABNORMAL LOW (ref 22–32)
CO2: 19 mmol/L — ABNORMAL LOW (ref 22–32)
CO2: 21 mmol/L — ABNORMAL LOW (ref 22–32)
CO2: 22 mmol/L (ref 22–32)
Calcium: 9.8 mg/dL (ref 8.9–10.3)
Calcium: 9.4 mg/dL (ref 8.9–10.3)
Calcium: 9.8 mg/dL (ref 8.9–10.3)
Calcium: 9.8 mg/dL (ref 8.9–10.3)
Calcium: 9.4 mg/dL (ref 8.9–10.3)
Calcium: 9.5 mg/dL (ref 8.9–10.3)
Chloride: 107 mmol/L (ref 98–111)
Chloride: 107 mmol/L (ref 98–111)
Chloride: 106 mmol/L (ref 98–111)
Chloride: 104 mmol/L (ref 98–111)
Chloride: 101 mmol/L (ref 98–111)
Chloride: 102 mmol/L (ref 98–111)
Creatinine, Ser: 1.12 mg/dL (ref 0.61–1.24)
Creatinine, Ser: 1.07 mg/dL (ref 0.61–1.24)
Creatinine, Ser: 0.88 mg/dL (ref 0.61–1.24)
Creatinine, Ser: 0.86 mg/dL (ref 0.61–1.24)
Creatinine, Ser: 0.77 mg/dL (ref 0.61–1.24)
Creatinine, Ser: 0.79 mg/dL (ref 0.61–1.24)
GFR, Estimated: >60 mL/min (ref >60)
GFR, Estimated: >60 mL/min (ref >60)
GFR, Estimated: >60 mL/min (ref >60)
GFR, Estimated: >60 mL/min (ref >60)
GFR, Estimated: >60 mL/min (ref >60)
GFR, Estimated: >60 mL/min (ref >60)
Glucose, Bld: 144 mg/dL — ABNORMAL HIGH (ref 70–99)
Glucose, Bld: 172 mg/dL — ABNORMAL HIGH (ref 70–99)
Glucose, Bld: 174 mg/dL — ABNORMAL HIGH (ref 70–99)
Glucose, Bld: 204 mg/dL — ABNORMAL HIGH (ref 70–99)
Glucose, Bld: 173 mg/dL — ABNORMAL HIGH (ref 70–99)
Glucose, Bld: 174 mg/dL — ABNORMAL HIGH (ref 70–99)
Potassium: 4.3 mmol/L (ref 3.5–5.1)
Potassium: 3.9 mmol/L (ref 3.5–5.1)
Potassium: 3.9 mmol/L (ref 3.5–5.1)
Potassium: 3.6 mmol/L (ref 3.5–5.1)
Potassium: 3.1 mmol/L — ABNORMAL LOW (ref 3.5–5.1)
Potassium: 3.1 mmol/L — ABNORMAL LOW (ref 3.5–5.1)
Sodium: 136 mmol/L (ref 135–145)
Sodium: 135 mmol/L (ref 135–145)
Sodium: 137 mmol/L (ref 135–145)
Sodium: 136 mmol/L (ref 135–145)
Sodium: 134 mmol/L — ABNORMAL LOW (ref 135–145)
Sodium: 135 mmol/L (ref 135–145)

## 2021-07-01 LAB — CBC
HCT: 43.2 % (ref 39.0–52.0)
Hemoglobin: 14.3 g/dL (ref 13.0–17.0)
MCH: 30.4 pg (ref 26.0–34.0)
MCHC: 33.1 g/dL (ref 30.0–36.0)
MCV: 91.7 fL (ref 80.0–100.0)
Platelets: 49 10*3/uL — ABNORMAL LOW (ref 150–400)
RBC: 4.71 MIL/uL (ref 4.22–5.81)
RDW: 12.1 % (ref 11.5–15.5)
WBC: 6.7 10*3/uL (ref 4.0–10.5)
nRBC: 0 % (ref 0.0–0.2)

## 2021-07-01 LAB — COMPREHENSIVE METABOLIC PANEL
ALT: 23 U/L (ref 0–44)
AST: 15 U/L (ref 15–41)
Albumin: 4.3 g/dL (ref 3.5–5.0)
Alkaline Phosphatase: 68 U/L (ref 38–126)
Anion gap: 13 (ref 5–15)
BUN: 23 mg/dL — ABNORMAL HIGH (ref 6–20)
CO2: 16 mmol/L — ABNORMAL LOW (ref 22–32)
Calcium: 9.7 mg/dL (ref 8.9–10.3)
Chloride: 106 mmol/L (ref 98–111)
Creatinine, Ser: 1.14 mg/dL (ref 0.61–1.24)
GFR, Estimated: >60 mL/min (ref >60)
Glucose, Bld: 141 mg/dL — ABNORMAL HIGH (ref 70–99)
Potassium: 4.1 mmol/L (ref 3.5–5.1)
Sodium: 135 mmol/L (ref 135–145)
Total Bilirubin: 1.3 mg/dL — ABNORMAL HIGH (ref 0.3–1.2)
Total Protein: 7.4 g/dL (ref 6.5–8.1)

## 2021-07-01 LAB — BLOOD GAS, VENOUS
Acid-base deficit: 7.5 mmol/L — ABNORMAL HIGH (ref 0.0–2.0)
Acid-base deficit: 11.4 mmol/L — ABNORMAL HIGH (ref 0.0–2.0)
Bicarbonate: 17.4 mmol/L — ABNORMAL LOW (ref 20.0–28.0)
Bicarbonate: 15.2 mmol/L — ABNORMAL LOW (ref 20.0–28.0)
FIO2: 21
FIO2: 21
O2 Saturation: 54.6 %
O2 Saturation: 68.8 %
Patient temperature: 36.9
Patient temperature: 37
pCO2, Ven: 38.2 mmHg — ABNORMAL LOW (ref 44.0–60.0)
pCO2, Ven: 31.3 mmHg — ABNORMAL LOW (ref 44.0–60.0)
pH, Ven: 7.291 (ref 7.250–7.430)
pH, Ven: 7.276 (ref 7.250–7.430)
pO2, Ven: <31 mmHg — CL (ref 32.0–45.0)
pO2, Ven: 37.3 mmHg (ref 32.0–45.0)

## 2021-07-01 LAB — PHOSPHORUS: Phosphorus: 1.2 mg/dL — ABNORMAL LOW (ref 2.5–4.6)

## 2021-07-01 LAB — MAGNESIUM: Magnesium: 2.1 mg/dL (ref 1.7–2.4)

## 2021-07-01 LAB — GLUCOSE, CAPILLARY
Glucose-Capillary: 131 mg/dL — ABNORMAL HIGH (ref 70–99)
Glucose-Capillary: 141 mg/dL — ABNORMAL HIGH (ref 70–99)
Glucose-Capillary: 176 mg/dL — ABNORMAL HIGH (ref 70–99)
Glucose-Capillary: 175 mg/dL — ABNORMAL HIGH (ref 70–99)
Glucose-Capillary: 171 mg/dL — ABNORMAL HIGH (ref 70–99)
Glucose-Capillary: 158 mg/dL — ABNORMAL HIGH (ref 70–99)
Glucose-Capillary: 143 mg/dL — ABNORMAL HIGH (ref 70–99)
Glucose-Capillary: 167 mg/dL — ABNORMAL HIGH (ref 70–99)
Glucose-Capillary: 219 mg/dL — ABNORMAL HIGH (ref 70–99)
Glucose-Capillary: 187 mg/dL — ABNORMAL HIGH (ref 70–99)
Glucose-Capillary: 132 mg/dL — ABNORMAL HIGH (ref 70–99)
Glucose-Capillary: 174 mg/dL — ABNORMAL HIGH (ref 70–99)
Glucose-Capillary: 182 mg/dL — ABNORMAL HIGH (ref 70–99)
Glucose-Capillary: 171 mg/dL — ABNORMAL HIGH (ref 70–99)
Glucose-Capillary: 183 mg/dL — ABNORMAL HIGH (ref 70–99)
Glucose-Capillary: 174 mg/dL — ABNORMAL HIGH (ref 70–99)

## 2021-07-01 LAB — PROTIME-INR
INR: 1 (ref 0.8–1.2)
Prothrombin Time: 13.3 seconds (ref 11.4–15.2)

## 2021-07-01 LAB — HIV ANTIBODY (ROUTINE TESTING W REFLEX): HIV Screen 4th Generation wRfx: NONREACTIVE

## 2021-07-01 LAB — APTT: aPTT: 26 seconds (ref 24–36)

## 2021-07-01 LAB — FOLATE: Folate: 20.5 ng/mL (ref >5.9)

## 2021-07-01 LAB — BETA-HYDROXYBUTYRIC ACID
Beta-Hydroxybutyric Acid: 4.79 mmol/L — ABNORMAL HIGH (ref 0.05–0.27)
Beta-Hydroxybutyric Acid: 4.67 mmol/L — ABNORMAL HIGH (ref 0.05–0.27)

## 2021-07-01 LAB — VITAMIN B12: Vitamin B-12: 837 pg/mL (ref 180–914)

## 2021-07-01 MED ORDER — CHLORHEXIDINE GLUCONATE CLOTH 2 % EX PADS
6.0000 | MEDICATED_PAD | Freq: Every day | CUTANEOUS | Status: DC
  Administered 2021-07-01 – 2021-07-03 (×3): 6 via TOPICAL

## 2021-07-01 MED ORDER — POTASSIUM CHLORIDE 10 MEQ/100ML IV SOLN
10.0000 meq | INTRAVENOUS | Status: AC
Start: 2021-07-01 — End: 2021-07-01
  Administered 2021-07-01 (×2): 10 meq via INTRAVENOUS
  Filled 2021-07-01 (×2): qty 100

## 2021-07-01 MED ORDER — NIRMATRELVIR/RITONAVIR (PAXLOVID)TABLET
3.0000 | ORAL_TABLET | Freq: Two times a day (BID) | ORAL | Status: DC
  Administered 2021-07-01 – 2021-07-03 (×4): 3 via ORAL
  Filled 2021-07-01: qty 30

## 2021-07-01 NOTE — Progress Notes (Signed)
0330 hrs: EndoTool transition options open. Dr. Josephine Cables notified. Patient to remain on insulin infusion for now given his elevated beta-hydroxy, low bicarb, etc. MD to reassess infusion after next BMP.

## 2021-07-01 NOTE — TOC Initial Note (Signed)
Transition of Care [facility]) - Initial/Assessment Note    Patient Details  Name: Todd Rodriguez MRN: [id number] Date of Birth: 26-Mar-1976  [facility] [address]) CM/SW Contact:    Boneta Lucks, RN Phone Number: 07/01/2021, 2:29 PM  Clinical Narrative:      Patient admitted with diabetic ketoacidosis. TOC consulted for Substance abuse resources.   TOC provided in 05/2019, Patient states he did not follow or attend any AA meetings. Patient states he has cut down on drinking, he drinks 2 drinks a week. He does not feel like he needs any meeting or outpatient services at this time.          Expected Discharge Plan: Home/Self Care Barriers to Discharge: Continued Medical Work up  Patient Goals and CMS Choice Patient states their goals for this hospitalization and ongoing recovery are:: to go home. CMS Medicare.gov Compare Post Acute Care list provided to:: Patient Choice offered to / list presented to : Patient  Expected Discharge Plan and Services Expected Discharge Plan: Home/Self Care    Living arrangements for the past 2 months: Single Family Home        Prior Living Arrangements/Services Living arrangements for the past 2 months: Single Family Home Lives with:: Spouse   Do you feel safe going back to the place where you live?: Yes         Activities of Daily Living Home Assistive Devices/Equipment: CBG Meter, Cane (specify quad or straight), Eyeglasses, Blood pressure cuff, Scales ADL Screening (condition at time of admission) Patient's cognitive ability adequate to safely complete daily activities?: Yes Is the patient deaf or have difficulty hearing?: No Does the patient have difficulty seeing, even when wearing glasses/contacts?: No Does the patient have difficulty concentrating, remembering, or making decisions?: No Patient able to express need for assistance with ADLs?: Yes Does the patient have difficulty dressing or bathing?: No Independently performs ADLs?: No Does  the patient have difficulty walking or climbing stairs?: Yes Weakness of Legs: Both Weakness of Arms/Hands: Both  Permission Sought/Granted  Emotional Assessment      Alcohol / Substance Use: Alcohol Use Psych Involvement: No (comment)  Admission diagnosis:  DKA (diabetic ketoacidosis) ([place]) [E11.10] Diabetic ketoacidosis without coma associated with diabetes mellitus due to underlying condition ([place]) [E08.10] DKA, type 2 ([place]) [E11.10] Patient Active Problem List   Diagnosis Date Noted   DKA, type 2 ([place]) 07/01/2021   DKA (diabetic ketoacidosis) ([place]) 06/30/2021   Hyponatremia 06/30/2021   Hyperglycemia 06/30/2021   Hypothermia 06/30/2021   COVID-19 virus infection 06/30/2021   Hyperkalemia 06/30/2021   AKI (acute kidney injury) ([place]) 06/30/2021   Nausea & vomiting 06/30/2021   Failure to thrive in adult 06/30/2021   Starvation ketoacidosis 06/30/2021   Tobacco abuse 06/30/2021   Loose stools 08/27/2020   Abnormal weight loss 04/26/2020   Rectal bleeding 04/26/2020   Pancreatic pseudocyst    Protein-calorie malnutrition, severe 05/29/2019   High anion gap metabolic acidosis 25/00/3704   Alcohol abuse 05/28/2019   Thrombocytopenia ([place]) 05/28/2019   Pancreatic cyst 05/28/2019   Hepatic lesion 05/28/2019   Dyslipidemia 05/11/2019   Abnormal LFTs 09/12/2018   Medication management 09/12/2018   S/P cervical spinal fusion 01/12/2018   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   Carpal tunnel syndrome of left wrist 12/10/2017   Cervical radiculopathy 11/11/2017   History of fusion of cervical spine 10/22/2017   Cramps, extremity 06/25/2015   Diabetes mellitus type 2, uncontrolled, without complications 88/89/1694   Hand cramps 01/25/2015  Fever, unspecified 03/26/2014   Hx of acute pancreatitis 01/03/2014   Tobacco dependence 01/03/2014   Other and unspecified hyperlipidemia 01/03/2014   GERD (gastroesophageal reflux disease) 11/22/2013   Diabetes  mellitus type 2, controlled, without complications ([place]) 14/83/0735   Essential hypertension, benign 10/25/2013   Leukocytosis, unspecified 10/25/2013   PCP:  Burnis Medin, MD Pharmacy:   [id number]Phone: [phone]: [phone] [facility] [place]New Address[facility] [place] [address]Phone: [phone]: [phone]

## 2021-07-01 NOTE — Progress Notes (Signed)
PROGRESS NOTE  [place] PIR:[id number] DOB: Oct 11, 1975 DOA: 06/30/2021 PCP: Burnis Medin, MD  Brief History:  45 year old male with a history of insulin-dependent diabetes mellitus, hypertension, alcohol abuse, tobacco abuse, GERD presenting with 2 to 3-day onset of elevated blood sugars with associated nausea and vomiting.  The patient also complained of some shortness of breath.  He continues to drink alcohol.  He states that he drinks about 1 bottle of vodka on a daily basis and drinks a 12 pack of beer on his "off days".  He denies any street drugs.  He denies any marijuana.  He denies any NSAID use.  He denies any fevers, chills, chest pain, hematemesis, abdominal pain, dysuria, hematuria, hematochezia, melena.  He endorses poor compliance with his insulin.  Notably, the patient was hospitalized at [facility] for DKA from 04/20/2021 to 04/23/2021.  He saw an endocrinologist on 05/19/2021 and was started on Tresiba 18 units daily, NovoLog 6 units with meals, and Jardiance 25 mg daily.  ED Course:  In the emergency department, temperature was 96.46F on arrival to ED.  He was tachycardic, but other vital signs are within normal range.  Work-up in the ED showed normal CBC except for thrombocytopenia, hyponatremia, hyperkalemia, bicarb < 7, BUN/creatinine 32/1.79 (baseline creatinine at 0.4-0.6).  VBG showed pH of 7.090, PCO2 20.3, PO2 49.9, bicarb 8.3 on room air, alcohol level was < 10.  Influenza A, B was negative, SARS coronavirus 2 was positive. Chest x-ray showed no active disease. Patient was started on insulin drip per Endo tool, IV hydration was provided, Protonix was given and hospitalist was asked to admit him for further evaluation and management  Assessment/Plan: DKA -?type 2 -check GAD and C-peptide --patient started on IV insulin with q 1 hour CBG check and q 4 hour BMPs -pt started on aggressive fluid resuscitation -Electrolytes were monitored and  repleted -transitioned to [place] insulin once anion gap closed -diet was advanced once anion gap closed -HbA1C--  AKI -Baseline creatinine 0.4-0.6 -Presented with serum creatinine 1.79 -Secondary to volume depletion -Continue IV fluids  Essential hypertension -Holding Lotrel in the setting of AKI -Restart amlodipine portion of Lotrel  Alcohol abuse -Alcohol withdrawal protocol  Tobacco abuse -Cessation discussed  GERD -Continue Protonix  Thrombocytopenia -due to alcohol, liver disease -B12 -folate  COVID-19 -incidental finding -stable on RA -start paxlovid    Status is: Inpatient  Remains inpatient appropriate because:Inpatient level of care appropriate due to severity of illness  Dispo: The patient is from: Home              Anticipated d/c is to: Home              Patient currently is not medically stable to d/c.   Difficult to place patient No        Family Communication:   spouse updated at bedside 9/27  Consultants:  none  Code Status:  FULL  DVT Prophylaxis [place] Lovenox   Procedures: As Listed in Progress Note Above  Antibiotics: None       Subjective: Patient denies fevers, chills, headache, chest pain, dyspnea, nausea, vomiting, diarrhea, abdominal pain, dysuria, hematuria, hematochezia, and melena.   Objective: Vitals:   07/01/21 1500 07/01/21 1600 07/01/21 1700 07/01/21 1800  BP: (!) 153/92 (!) 161/96 (!) 162/92 (!) 145/90  Pulse: (!) 102 (!) 103 97 (!) 104  Resp: 15 13 14 16   Temp:      TempSrc:  SpO2: 99% 100% 100% 100%  Weight:      Height:        Intake/Output Summary (Last 24 hours) at 07/01/2021 1843 Last data filed at 07/01/2021 1839 Gross per 24 hour  Intake 4770.71 ml  Output 1851 ml  Net 2919.71 ml   Weight change:  Exam:  General:  Pt is alert, follows commands appropriately, not in acute distress HEENT: No icterus, No thrush, No neck mass, [place]/AT Cardiovascular: RRR, S1/S2, no rubs, no  gallops Respiratory: CTA bilaterally, no wheezing, no crackles, no rhonchi Abdomen: Soft/+BS, non tender, non distended, no guarding Extremities: No edema, No lymphangitis, No petechiae, No rashes, no synovitis   Data Reviewed: I have personally reviewed following labs and imaging studies Basic Metabolic Panel: Recent Labs  Lab 07/01/21 0151 07/01/21 0202 07/01/21 0630 07/01/21 1019 07/01/21 1415  NA 135 136 135 137 136  K 4.1 4.3 3.9 3.9 3.6  CL 106 107 107 106 104  CO2 16* 16* 17* 19* 19*  GLUCOSE 141* 144* 172* 174* 204*  BUN 23* 23* 19 16 15   CREATININE 1.14 1.12 1.07 0.88 0.86  CALCIUM 9.7 9.8 9.4 9.8 9.8  MG 2.1  --   --   --   --   PHOS 1.2*  --   --   --   --    Liver Function Tests: Recent Labs  Lab 06/30/21 1706 07/01/21 0151  AST 17 15  ALT 27 23  ALKPHOS 84 68  BILITOT 2.0* 1.3*  PROT 8.2* 7.4  ALBUMIN 4.7 4.3   No results for input(s): LIPASE, AMYLASE in the last 168 hours. No results for input(s): AMMONIA in the last 168 hours. Coagulation Profile: Recent Labs  Lab 07/01/21 0151  INR 1.0   CBC: Recent Labs  Lab 06/30/21 1706 06/30/21 1722 07/01/21 0151  WBC 7.0  --  6.7  NEUTROABS 6.3  --   --   HGB 15.5 16.7 14.3  HCT 50.5 49.0 43.2  MCV 99.2  --  91.7  PLT 55*  --  49*   Cardiac Enzymes: No results for input(s): CKTOTAL, CKMB, CKMBINDEX, TROPONINI in the last 168 hours. BNP: Invalid input(s): POCBNP CBG: Recent Labs  Lab 07/01/21 1226 07/01/21 1434 07/01/21 1537 07/01/21 1709 07/01/21 1828  GLUCAP 167* 219* 187* 132* 174*   HbA1C: No results for input(s): HGBA1C in the last 72 hours. Urine analysis:    Component Value Date/Time   COLORURINE STRAW (A) 06/30/2021 2115   APPEARANCEUR CLEAR 06/30/2021 2115   LABSPEC 1.017 06/30/2021 2115   PHURINE 5.0 06/30/2021 2115   GLUCOSEU >=500 (A) 06/30/2021 2115   HGBUR MODERATE (A) 06/30/2021 2115   BILIRUBINUR NEGATIVE 06/30/2021 2115   BILIRUBINUR n 01/01/2015 1112    KETONESUR 80 (A) 06/30/2021 2115   PROTEINUR 30 (A) 06/30/2021 2115   UROBILINOGEN 0.2 01/01/2015 1112   UROBILINOGEN 0.2 10/25/2013 1541   NITRITE NEGATIVE 06/30/2021 2115   LEUKOCYTESUR NEGATIVE 06/30/2021 2115   Sepsis Labs: @LABRCNTIP (procalcitonin:4,lacticidven:4) ) Recent Results (from the past 240 hour(s))  Resp Panel by RT-PCR (Flu A&B, Covid) Nasopharyngeal Swab     Status: Abnormal   Collection Time: 06/30/21  4:51 PM   Specimen: Nasopharyngeal Swab; Nasopharyngeal(NP) swabs in vial transport medium  Result Value Ref Range Status   SARS Coronavirus 2 by RT PCR POSITIVE (A) NEGATIVE Final    Comment: LONG,L AT 1818 ON 9.26.22 BY RUCINSKI,B (NOTE) SARS-CoV-2 target nucleic acids are DETECTED.  The SARS-CoV-2 RNA is generally detectable in  upper respiratory specimens during the acute phase of infection. Positive results are indicative of the presence of the identified virus, but do not rule out bacterial infection or co-infection with other pathogens not detected by the test. Clinical correlation with patient history and other diagnostic information is necessary to determine patient infection status. The expected result is Negative.  Fact Sheet for Patients: EntrepreneurPulse.com.au  Fact Sheet for Healthcare Providers: IncredibleEmployment.be  This test is not yet approved or cleared by the [place] FDA and  has been authorized for detection and/or diagnosis of SARS-CoV-2 by FDA under an Emergency Use Authorization (EUA).  This EUA will remain in effect (meaning this test can be used) for the duration of  the COVID-19 dec laration under Section 564(b)(1) of the Act, 21 U.S.C. section 360bbb-3(b)(1), unless the authorization is terminated or revoked sooner.     Influenza A by PCR NEGATIVE NEGATIVE Final   Influenza B by PCR NEGATIVE NEGATIVE Final    Comment: (NOTE) The Xpert Xpress SARS-CoV-2/FLU/RSV plus assay is intended  as an aid in the diagnosis of influenza from Nasopharyngeal swab specimens and should not be used as a sole basis for treatment. Nasal washings and aspirates are unacceptable for Xpert Xpress SARS-CoV-2/FLU/RSV testing.  Fact Sheet for Patients: EntrepreneurPulse.com.au  Fact Sheet for Healthcare Providers: IncredibleEmployment.be  This test is not yet approved or cleared by the [place] FDA and has been authorized for detection and/or diagnosis of SARS-CoV-2 by FDA under an Emergency Use Authorization (EUA). This EUA will remain in effect (meaning this test can be used) for the duration of the COVID-19 declaration under Section 564(b)(1) of the Act, 21 U.S.C. section 360bbb-3(b)(1), unless the authorization is terminated or revoked.  Performed at [facility], [address] [place], [place] [address]   MRSA Next Gen by PCR, Nasal     Status: None   Collection Time: 06/30/21  9:15 PM   Specimen: Nasal Mucosa; Nasal Swab  Result Value Ref Range Status   MRSA by PCR Next Gen NOT DETECTED NOT DETECTED Final    Comment: (NOTE) The GeneXpert MRSA Assay (FDA approved for NASAL specimens only), is one component of a comprehensive MRSA colonization surveillance program. It is not intended to diagnose MRSA infection nor to guide or monitor treatment for MRSA infections. Test performance is not FDA approved in patients less than 83 years old. Performed at [facility], [address], [place], [place] [address]      Scheduled Meds:  Chlorhexidine Gluconate Cloth  6 each Topical Daily   famotidine  20 mg Oral Once   feeding supplement  237 mL Oral BID BM   folic acid  1 mg Oral Daily   multivitamin with minerals  1 tablet Oral Daily   pantoprazole  40 mg Oral Daily   thiamine  100 mg Oral Daily   Or   thiamine  100 mg Intravenous Daily   Continuous Infusions:  dextrose 5% lactated ringers 125 mL/hr at 07/01/21 1830   insulin 0.4 Units/hr  (07/01/21 1830)   lactated ringers 125 mL/hr at 06/30/21 2100    Procedures/Studies: DG Chest Port 1 View  Result Date: 06/30/2021 CLINICAL DATA:  Pt reports SHOB, abdominal pain, and weakness that started 2 days ago. weak EXAM: PORTABLE CHEST 1 VIEW COMPARISON:  Chest x-ray 10/18/2019, CT chest 05/28/2019 FINDINGS: The heart and mediastinal contours are unchanged. Aortic calcification. No focal consolidation. No pulmonary edema. No pleural effusion. No pneumothorax. No acute osseous abnormality. IMPRESSION: No active disease.  Electronically Signed   By: Iven Finn M.D.   On: 06/30/2021 17:56    Orson Eva, DO  [facility]  If 7PM-7AM, please contact night-coverage [email or web address] Password [facility] 07/01/2021, 6:43 PM   LOS: 0 days

## 2021-07-01 NOTE — Progress Notes (Signed)
Initial Nutrition Assessment  DOCUMENTATION CODES:   Severe malnutrition in context of social or environmental circumstances  INTERVENTION:  Boost Breeze po TID, each supplement provides 250 kcal and 9 grams of protein   When diet advanced to full liquids start: Ensure Enlive po BID, each supplement provides 350 kcal and 20 grams of protein   DM diet education completed. Handout- Diabetes Plate Method   NUTRITION DIAGNOSIS:   Severe Malnutrition related to social / environmental circumstances (Medical non-compliance, ETOH abuse) as evidenced by severe muscle depletion, percent weight loss, moderate fat depletion, mild fat depletion, moderate muscle depletion.   GOAL:  Weight gain, Patient will meet greater than or equal to 90% of their needs   MONITOR:  Weight trends, Labs, PO intake, Supplement acceptance, Diet advancement  REASON FOR ASSESSMENT:   Consult     ASSESSMENT: Patient is an underweight 45 yo male with a history of DM2, GERD, pancreatic pseudocyst, hepatic steatosis, ETOH abuse, Myalgia and myositis.   Patient presents with abdominal pain and hyperglycemia. Positive for COVID pneumonia. FTT in an adult, DKA, starvation ketoacidosis.   Anion gap closed and patient is currently on clear liquids.  Per chart -pt has not eaten 3 days PTA. Patient diet hx reviewed. Energy intake </= 50% for >/= 5 days.   Weights from previous encounters:  04/30/21- 69.3 kg 05/19/21 -66.5 kg 06/30/21- 56.5 kg Significant weight loss 15% x 5 weeks per documented weights. Poor compliance with insulin per MD note.   Medications reviewed and include: folvite, protonix, thiamine, pepcid, MVI, insulin.   IV-D5@ 125 ml/hr  Labs: BMP Latest Ref Rng & Units 07/02/2021 07/02/2021 07/01/2021  Glucose 70 - 99 mg/dL 115(H) 159(H) 174(H)  BUN 6 - 20 mg/dL 7 8 11   Creatinine 0.61 - 1.24 mg/dL 0.59(L) 0.72 0.79  Sodium 135 - 145 mmol/L 133(L) 134(L) 135  Potassium 3.5 - 5.1 mmol/L 3.5 3.1(L) 3.1(L)   Chloride 98 - 111 mmol/L 100 100 101  CO2 22 - 32 mmol/L 24 24 21(L)  Calcium 8.9 - 10.3 mg/dL 9.4 9.4 9.5      NUTRITION - FOCUSED PHYSICAL EXAM:  Flowsheet Row Most Recent Value  Orbital Region Mild depletion  Upper Arm Region Mild depletion  Thoracic and Lumbar Region Moderate depletion  Buccal Region No depletion  Temple Region Mild depletion  Clavicle Bone Region Moderate depletion  Clavicle and Acromion Bone Region Moderate depletion  Dorsal Hand Mild depletion  Patellar Region Severe depletion  Anterior Thigh Region Severe depletion  Posterior Calf Region Moderate depletion  Edema (RD Assessment) None  Hair Reviewed  Eyes Reviewed  Mouth Reviewed  Skin Reviewed  Nails Reviewed      Diet Order:   Diet Order             Diet clear liquid Room service appropriate? Yes; Fluid consistency: Thin  Diet effective now                   EDUCATION NEEDS:  Education needs have been addressed  Skin:  Skin Assessment: Reviewed RN Assessment  Last BM:  9/28 type 1  Height:   Ht Readings from Last 1 Encounters:  06/30/21 6' 2"  (1.88 m)    Weight:   Wt Readings from Last 1 Encounters:  06/30/21 56.5 kg    Ideal Body Weight:   86 kg  BMI:  Body mass index is 15.99 kg/m.  Estimated Nutritional Needs:   Kcal:  2000-2280  Protein:  97-105 gr  Fluid:  >1700 ml daily   Colman Cater MS,RD,CSG,LDN Contact: Shea Evans

## 2021-07-01 NOTE — Progress Notes (Addendum)
Inpatient Diabetes Program Recommendations  AACE/ADA: New Consensus Statement on Inpatient Glycemic Control  Target Ranges:  Prepandial:   less than 140 mg/dL      Peak postprandial:   less than 180 mg/dL (1-2 hours)      Critically ill patients:  140 - 180 mg/dL   Results for [facility], Lucia (MRN [id number]) as of 07/01/2021 07:49  Ref. Range 07/01/2021 01:05 07/01/2021 02:13 07/01/2021 03:21 07/01/2021 04:28 07/01/2021 06:18  Glucose-Capillary Latest Ref Range: 70 - 99 mg/dL 131 (H) 141 (H) 176 (H) 175 (H) 171 (H)   Results for [facility], Jaimeson (MRN [id number]) as of 07/01/2021 07:49  Ref. Range 06/30/2021 17:06 06/30/2021 17:22 06/30/2021 18:24  Beta-Hydroxybutyric Acid Latest Ref Range: 0.05 - 0.27 mmol/L   >8.00 (H)  Glucose Latest Ref Range: 70 - 99 mg/dL 726 (HH) >700 (HH)    Review of Glycemic Control  Diabetes history: DM2 Outpatient Diabetes medications: Tresiba 20 units daily, Novolog 6 units TID with meals, Jardiance 25 mg daily Current orders for Inpatient glycemic control: IV insulin for DKA  Inpatient Diabetes Program Recommendations:    Insulin: IV insulin should be continued until acidosis is completely cleared. Once provider is ready to transition from IV to SQ insulin, please consider ordering Semglee 17 units Q24H (based on 56.5 kg x 0.3 units), CBGs Q4H, Novolog 0-9 units Q4H.  Labs: Please consider ordering c-peptide and GAD antibodies.   Outpatient DM meds: Due to patient being admitted with DKA at [facility] in July and currently admitted with DKA, would recommend discontinuing Jardiance as an outpatient due to risk of DKA.   NOTE: Patient admitted with DKA, initial glucose 726 mg/dl on 06/30/21, and per H&P patient has not eaten in 3 days and has not been consistently taking insulin. Patient is currently ordered IV insulin and acidosis is improving.  In reviewing chart, noted patient seen PCP on 04/30/21 after hospitalization at [facility] (04/20/21-04/23/21) for DKA. Also,  noted patient seen Dr. Kelton Pillar (Endocrinologist) on 05/19/21 and DM medications were changed to Tresiba 18 units daily, started on Novolog 6 units TID with meals, and continue Jardiance 25 mg daily. Once acidosis is completely cleared, recommend ordering SQ basal and correction insulin; if patient is eating well will likely need meal coverage insulin as well. Will plan to speak with patient today.  Addendum 07/01/21@12 :35-Spoke with patient over the phone regarding DM. Patient reports being followed by Dr. Kelton Pillar (Endocrinologist) for diabetes management and currently taking Tresiba 20 units daily, Novolog 6 units TID with meals, and Jardiance 25 mg daily as an outpatient for diabetes control. Patient reports he has not been taking DM medications consistently over the past several days due to not feeling well.  Patient reports checking glucose 2-3 times per day and that it is "up and down". Patient states he frequently has glucose over 300 mg/dl and denies any issues with hypoglycemia. Patient states he was recently prescribed a Dexcom CGM but he has not received it yet.  Discussed importance of checking CBGs and maintaining good CBG control to prevent long-term and short-term complications. Stressed to the patient the importance of improving glycemic control to prevent further complications from uncontrolled diabetes. Discussed taking DM medications consistently and reaching out to Dr. Kelton Pillar if glucose is not well controlled for recommendations on insulin adjustments. Patient states he has been taking insulin and Jardiance for many years. Discussed risk of DKA with Jardiance and explained that it would be recommended that Jardiance not be continued at discharge. Discussed  Type 1 versus Type 2 DM and informed patient that it was requested that a c-peptide and GAD antibodies be checked to ensure he is able to make some insulin and to determine type of DM he has. Explained it would be up to attending  provider to order if they are agreeable. Patient states he has everything he needs at home for DM management. Encouraged patient to take insulin consistently to prevent reoccurance of DKA.   Patient verbalized understanding of information discussed and reports no further questions at this time related to diabetes.   Thanks, Barnie Alderman, RN, MSN, CDE Diabetes Coordinator Inpatient Diabetes Program [phone] (Team Pager from 8am to 5pm)

## 2021-07-01 NOTE — Plan of Care (Signed)

## 2021-07-02 DIAGNOSIS — E0810 Diabetes mellitus due to underlying condition with ketoacidosis without coma: Secondary | ICD-10-CM | POA: Diagnosis not present

## 2021-07-02 DIAGNOSIS — N179 Acute kidney failure, unspecified: Secondary | ICD-10-CM | POA: Diagnosis not present

## 2021-07-02 DIAGNOSIS — U071 COVID-19: Secondary | ICD-10-CM | POA: Diagnosis not present

## 2021-07-02 DIAGNOSIS — F1010 Alcohol abuse, uncomplicated: Secondary | ICD-10-CM | POA: Diagnosis not present

## 2021-07-02 LAB — GLUCOSE, CAPILLARY
Glucose-Capillary: 154 mg/dL — ABNORMAL HIGH (ref 70–99)
Glucose-Capillary: 158 mg/dL — ABNORMAL HIGH (ref 70–99)
Glucose-Capillary: 161 mg/dL — ABNORMAL HIGH (ref 70–99)
Glucose-Capillary: 158 mg/dL — ABNORMAL HIGH (ref 70–99)
Glucose-Capillary: 193 mg/dL — ABNORMAL HIGH (ref 70–99)
Glucose-Capillary: 113 mg/dL — ABNORMAL HIGH (ref 70–99)
Glucose-Capillary: 122 mg/dL — ABNORMAL HIGH (ref 70–99)
Glucose-Capillary: 145 mg/dL — ABNORMAL HIGH (ref 70–99)
Glucose-Capillary: 196 mg/dL — ABNORMAL HIGH (ref 70–99)
Glucose-Capillary: 155 mg/dL — ABNORMAL HIGH (ref 70–99)

## 2021-07-02 LAB — BASIC METABOLIC PANEL
Anion gap: 10 (ref 5–15)
Anion gap: 9 (ref 5–15)
Anion gap: 7 (ref 5–15)
BUN: 8 mg/dL (ref 6–20)
BUN: 7 mg/dL (ref 6–20)
BUN: 7 mg/dL (ref 6–20)
CO2: 24 mmol/L (ref 22–32)
CO2: 24 mmol/L (ref 22–32)
CO2: 25 mmol/L (ref 22–32)
Calcium: 9.4 mg/dL (ref 8.9–10.3)
Calcium: 9.4 mg/dL (ref 8.9–10.3)
Calcium: 9.2 mg/dL (ref 8.9–10.3)
Chloride: 100 mmol/L (ref 98–111)
Chloride: 100 mmol/L (ref 98–111)
Chloride: 100 mmol/L (ref 98–111)
Creatinine, Ser: 0.72 mg/dL (ref 0.61–1.24)
Creatinine, Ser: 0.59 mg/dL — ABNORMAL LOW (ref 0.61–1.24)
Creatinine, Ser: 0.6 mg/dL — ABNORMAL LOW (ref 0.61–1.24)
GFR, Estimated: >60 mL/min (ref >60)
GFR, Estimated: >60 mL/min (ref >60)
GFR, Estimated: >60 mL/min (ref >60)
Glucose, Bld: 159 mg/dL — ABNORMAL HIGH (ref 70–99)
Glucose, Bld: 115 mg/dL — ABNORMAL HIGH (ref 70–99)
Glucose, Bld: 113 mg/dL — ABNORMAL HIGH (ref 70–99)
Potassium: 3.1 mmol/L — ABNORMAL LOW (ref 3.5–5.1)
Potassium: 3.5 mmol/L (ref 3.5–5.1)
Potassium: 4.4 mmol/L (ref 3.5–5.1)
Sodium: 134 mmol/L — ABNORMAL LOW (ref 135–145)
Sodium: 133 mmol/L — ABNORMAL LOW (ref 135–145)
Sodium: 132 mmol/L — ABNORMAL LOW (ref 135–145)

## 2021-07-02 LAB — HEMOGLOBIN A1C
Hgb A1c MFr Bld: 8.3 % — ABNORMAL HIGH (ref 4.8–5.6)
Mean Plasma Glucose: 191.51 mg/dL

## 2021-07-02 LAB — D-DIMER, QUANTITATIVE: D-Dimer, Quant: 1.08 ug/mL-FEU — ABNORMAL HIGH (ref 0.00–0.50)

## 2021-07-02 LAB — C-REACTIVE PROTEIN: CRP: 1.1 mg/dL — ABNORMAL HIGH (ref <1.0)

## 2021-07-02 LAB — BETA-HYDROXYBUTYRIC ACID
Beta-Hydroxybutyric Acid: 2.76 mmol/L — ABNORMAL HIGH (ref 0.05–0.27)
Beta-Hydroxybutyric Acid: 2.24 mmol/L — ABNORMAL HIGH (ref 0.05–0.27)

## 2021-07-02 LAB — POTASSIUM: Potassium: 3.9 mmol/L (ref 3.5–5.1)

## 2021-07-02 LAB — FERRITIN: Ferritin: 524 ng/mL — ABNORMAL HIGH (ref 24–336)

## 2021-07-02 LAB — LACTATE DEHYDROGENASE: LDH: 81 U/L — ABNORMAL LOW (ref 98–192)

## 2021-07-02 LAB — MAGNESIUM: Magnesium: 1.8 mg/dL (ref 1.7–2.4)

## 2021-07-02 MED ORDER — ATORVASTATIN CALCIUM 40 MG PO TABS
40.0000 mg | ORAL_TABLET | Freq: Every day | ORAL | Status: DC
  Administered 2021-07-02 – 2021-07-03 (×2): 40 mg via ORAL
  Filled 2021-07-02 (×2): qty 1

## 2021-07-02 MED ORDER — POTASSIUM CHLORIDE 10 MEQ/100ML IV SOLN
10.0000 meq | INTRAVENOUS | Status: AC
Start: 2021-07-02 — End: 2021-07-02
  Administered 2021-07-02 (×2): 10 meq via INTRAVENOUS
  Filled 2021-07-02 (×2): qty 100

## 2021-07-02 MED ORDER — INSULIN ASPART 100 UNIT/ML IJ SOLN
6.0000 [IU] | Freq: Three times a day (TID) | INTRAMUSCULAR | Status: DC
  Administered 2021-07-02 – 2021-07-03 (×3): 6 [IU] via SUBCUTANEOUS

## 2021-07-02 MED ORDER — INSULIN ASPART 100 UNIT/ML IJ SOLN: 0.0000 [IU] | Freq: Every day | INTRAMUSCULAR | Status: DC

## 2021-07-02 MED ORDER — POTASSIUM CHLORIDE CRYS ER 20 MEQ PO TBCR
40.0000 meq | EXTENDED_RELEASE_TABLET | Freq: Once | ORAL | Status: AC
  Administered 2021-07-02: 40 meq via ORAL
  Filled 2021-07-02: qty 2

## 2021-07-02 MED ORDER — COVID-19MRNA BIVAL VACC PFIZER 30 MCG/0.3ML IM SUSP
0.3000 mL | Freq: Once | INTRAMUSCULAR | Status: DC
  Filled 2021-07-02: qty 0.3

## 2021-07-02 MED ORDER — BENAZEPRIL HCL 10 MG PO TABS
10.0000 mg | ORAL_TABLET | Freq: Every day | ORAL | Status: DC
  Administered 2021-07-02 – 2021-07-03 (×2): 10 mg via ORAL
  Filled 2021-07-02 (×2): qty 1

## 2021-07-02 MED ORDER — AMLODIPINE BESYLATE 5 MG PO TABS
2.5000 mg | ORAL_TABLET | Freq: Every day | ORAL | Status: DC
  Administered 2021-07-02 – 2021-07-03 (×2): 2.5 mg via ORAL
  Filled 2021-07-02 (×2): qty 1

## 2021-07-02 MED ORDER — INSULIN ASPART 100 UNIT/ML IJ SOLN: 0.0000 [IU] | Freq: Three times a day (TID) | INTRAMUSCULAR | Status: DC

## 2021-07-02 MED ORDER — POTASSIUM CHLORIDE 10 MEQ/100ML IV SOLN
10.0000 meq | INTRAVENOUS | Status: AC
Start: 2021-07-02 — End: 2021-07-02
  Administered 2021-07-02 (×3): 10 meq via INTRAVENOUS
  Filled 2021-07-02 (×2): qty 100

## 2021-07-02 MED ORDER — INSULIN GLARGINE-YFGN 100 UNIT/ML ~~LOC~~ SOLN
10.0000 [IU] | SUBCUTANEOUS | Status: DC
  Administered 2021-07-02: 10 [IU] via SUBCUTANEOUS
  Filled 2021-07-02 (×2): qty 0.1

## 2021-07-02 MED ORDER — EMPAGLIFLOZIN 25 MG PO TABS
25.0000 mg | ORAL_TABLET | Freq: Every day | ORAL | Status: DC
  Filled 2021-07-02 (×3): qty 1

## 2021-07-02 MED ORDER — BOOST / RESOURCE BREEZE PO LIQD CUSTOM
1.0000 | Freq: Three times a day (TID) | ORAL | Status: DC
  Administered 2021-07-02 – 2021-07-03 (×3): 1 via ORAL

## 2021-07-02 MED ORDER — INSULIN GLARGINE-YFGN 100 UNIT/ML ~~LOC~~ SOLN
18.0000 [IU] | Freq: Every day | SUBCUTANEOUS | Status: DC
  Administered 2021-07-03: 18 [IU] via SUBCUTANEOUS
  Filled 2021-07-02 (×3): qty 0.18

## 2021-07-02 MED ORDER — INSULIN ASPART 100 UNIT/ML IJ SOLN
0.0000 [IU] | Freq: Three times a day (TID) | INTRAMUSCULAR | Status: DC
  Administered 2021-07-02: 1 [IU] via SUBCUTANEOUS

## 2021-07-02 MED ORDER — POTASSIUM CHLORIDE 10 MEQ/100ML IV SOLN: 10.0000 meq | INTRAVENOUS | Status: DC

## 2021-07-02 NOTE — Progress Notes (Signed)
  Anion gap metabolic acidosis secondary to DKA with possible superimposed starvation ketoacidosis  Transition to Sub-Q Insulin  Serum glucose 159,  bicarb 24 She was started on insulin drip, IV LR with IV potassium per DKA protocol in the ED Anion gap already closed, currently at 10 Continue IV D5 LR and maintain blood glucose within 200-250 mg/dL Clear liquid consistent carb diet will be provided Subcu insulin 10 units x 1 will be given Continue IV LR for about 2 hours after subcu insulin and check BMP and VBG prior to discontinuing IV drip Notify physician if patient is able to tolerate above without vomiting and patient will be started on basal insulin regimen and sliding scale If patient continues to have nausea/vomiting, patient may require further IV D5 half-normal saline and titrated insulin drip pending better control of nausea/vomiting.

## 2021-07-02 NOTE — Progress Notes (Signed)
PROGRESS NOTE    Todd Rodriguez  DXA:[id number] DOB: Jul 16, 1976 DOA: 06/30/2021 PCP: Burnis Medin, MD   Brief Narrative:  45 year old BM PMHx  insulin-dependent diabetes mellitus, hypertension, alcohol abuse, tobacco abuse, GERD presenting with 2 to 3-day onset of elevated blood sugars with associated nausea and vomiting.  The patient also complained of some shortness of breath.  He continues to drink alcohol.  He states that he drinks about 1 bottle of vodka on a daily basis and drinks a 12 pack of beer on his "off days".  He denies any street drugs.  He denies any marijuana.  He denies any NSAID use.  He denies any fevers, chills, chest pain, hematemesis, abdominal pain, dysuria, hematuria, hematochezia, melena.  He endorses poor compliance with his insulin.   Notably, the patient was hospitalized at [facility] for DKA from 04/20/2021 to 04/23/2021.  He saw an endocrinologist on 05/19/2021 and was started on Tresiba 18 units daily, NovoLog 6 units with meals, and Jardiance 25 mg daily.   ED Course:  In the emergency department, temperature was 96.62F on arrival to ED.  He was tachycardic, but other vital signs are within normal range.  Work-up in the ED showed normal CBC except for thrombocytopenia, hyponatremia, hyperkalemia, bicarb < 7, BUN/creatinine 32/1.79 (baseline creatinine at 0.4-0.6).  VBG showed pH of 7.090, PCO2 20.3, PO2 49.9, bicarb 8.3 on room air, alcohol level was < 10.  Influenza A, B was negative, SARS coronavirus 2 was positive. Chest x-ray showed no active disease. Patient was started on insulin drip per Endo tool, IV hydration was provided, Protonix was given and hospitalist was asked to admit him for further evaluation and management   Subjective: A/O x4, negative CP, negative SOB, negative N/V.  Admits to not taking his medications as prescribed for his diabetes.   Assessment & Plan:  Covid vaccination; vaccinated 2/3.  Requests booster vaccination.  After discussion with  pharmacy we will hold on bivalent booster   Principal Problem:   DKA (diabetic ketoacidosis) ([place]) Active Problems:   Essential hypertension, benign   GERD (gastroesophageal reflux disease)   High anion gap metabolic acidosis   Alcohol abuse   Thrombocytopenia (HCC)   Hyponatremia   Hyperglycemia   Hypothermia   COVID-19 virus infection   Hyperkalemia   AKI (acute kidney injury) (HCC)   Nausea & vomiting   Failure to thrive in adult   Starvation ketoacidosis   Tobacco abuse   DKA, type 2 ([place])   DKA -?type 2 -check GAD and C-peptide --patient started on IV insulin with q 1 hour CBG check and q 4 hour BMPs -pt started on aggressive fluid resuscitation -Electrolytes were monitored and repleted -transitioned to [place] insulin once anion gap closed -diet was advanced once anion gap closed -7/27 Hemoglobin A1c= 10.9 -9/28 Uncontrolled diabetic admitted for DKA educate patient on appropriate diabetic diet -9/28 Place patient on carb modified diet, and restart home medication regimen.  Patient admits he has not been taking insulin as prescribed because he was waiting for his Dexcom to arrive.   AKI -Baseline creatinine 0.4-0.6 -Presented with serum creatinine 1.79 -Secondary to volume depletion -Continue IV fluids Lab Results  Component Value Date   CREATININE 0.60 (L) 07/02/2021   CREATININE 0.59 (L) 07/02/2021   CREATININE 0.72 07/02/2021   CREATININE 0.79 07/01/2021   CREATININE 0.77 07/01/2021  -Resolved   Essential hypertension -9/28 Lotrel 2.5-10 mg daily   alcohol abuse -CIWA protocol -Discussed at length need for patient to discontinue  EtOH given his uncontrolled diabetes.  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, liver disease -B12 -folate   Positive COVID-19 -incidental finding COVID-19 Labs  Recent Labs    07/02/21 1118  DDIMER 1.08*  FERRITIN 524*  LDH 81*  CRP 1.1*    Lab Results   Component Value Date   SARSCOV2NAA POSITIVE (A) 06/30/2021   SARSCOV2NAA NEGATIVE 09/20/2020   [place] NEGATIVE 05/10/2020   [place] NEGATIVE 11/25/2019   -stable on RA -Complete 5-day course Paxlovid  Hypokalemia - Potassium goal>4 -9/28 potassium IV 50 mEq   DVT prophylaxis: SCD Code Status: Full Family Communication:  Status is: Inpatient    Dispo: The patient is from: Home              Anticipated d/c is to: Home              Anticipated d/c date is: 1 day              Patient currently is not medically stable to d/c.      Consultants:  DM coordinator  Procedures/Significant Events:     I have personally reviewed and interpreted all radiology studies and my findings are as above.  VENTILATOR SETTINGS:    Cultures   Antimicrobials:    Devices    LINES / TUBES:      Continuous Infusions:  dextrose 5% lactated ringers 125 mL/hr at 07/01/21 2242   insulin 1.8 Units/hr (07/01/21 2242)   lactated ringers 125 mL/hr at 06/30/21 2100   potassium chloride 10 mEq (07/02/21 0609)     Objective: Vitals:   07/02/21 0200 07/02/21 0300 07/02/21 0400 07/02/21 0600  BP: (!) 156/88 (!) 159/87 (!) 164/91 (!) 150/90  Pulse: 98 88 90 82  Resp: 17 16 16 13   Temp:      TempSrc:      SpO2: 100% 100% 99% 100%  Weight:      Height:        Intake/Output Summary (Last 24 hours) at 07/02/2021 0650 Last data filed at 07/02/2021 0973 Gross per 24 hour  Intake 3738.44 ml  Output 1000 ml  Net 2738.44 ml   Filed Weights   06/30/21 1640 06/30/21 2109  Weight: 63.5 kg 56.5 kg    Examination:  General: A/O x4, No acute respiratory distress Eyes: negative scleral hemorrhage, negative anisocoria, negative icterus ENT: Negative Runny nose, negative gingival bleeding, Neck:  Negative scars, masses, torticollis, lymphadenopathy, JVD Lungs: Clear to auscultation bilaterally without wheezes or crackles Cardiovascular: Regular rate and rhythm without  murmur gallop or rub normal S1 and S2 Abdomen: negative abdominal pain, nondistended, positive soft, bowel sounds, no rebound, no ascites, no appreciable mass Extremities: No significant cyanosis, clubbing, or edema bilateral lower extremities Skin: Negative rashes, lesions, ulcers Psychiatric:  Negative depression, negative anxiety, negative fatigue, negative mania  Central nervous system:  Cranial nerves II through XII intact, tongue/uvula midline, all extremities muscle strength 5/5, sensation intact throughout, negative dysarthria, negative expressive aphasia, negative receptive aphasia.  .     Data Reviewed: Care during the described time interval was provided by me .  I have reviewed this patient's available data, including medical history, events of note, physical examination, and all test results as part of my evaluation.   CBC: Recent Labs  Lab 06/30/21 1706 06/30/21 1722 07/01/21 0151  WBC 7.0  --  6.7  NEUTROABS 6.3  --   --   HGB 15.5 16.7 14.3  HCT 50.5 49.0 43.2  MCV 99.2  --  91.7  PLT 55*  --  49*   Basic Metabolic Panel: Recent Labs  Lab 07/01/21 0151 07/01/21 0202 07/01/21 0630 07/01/21 1019 07/01/21 1415 07/01/21 2020 07/02/21 0253  NA 135   < > 135 137 136 135  134* 134*  K 4.1   < > 3.9 3.9 3.6 3.1*  3.1* 3.1*  CL 106   < > 107 106 104 101  102 100  CO2 16*   < > 17* 19* 19* 21*  22 24  GLUCOSE 141*   < > 172* 174* 204* 174*  173* 159*  BUN 23*   < > 19 16 15 11  11 8   CREATININE 1.14   < > 1.07 0.88 0.86 0.79  0.77 0.72  CALCIUM 9.7   < > 9.4 9.8 9.8 9.5  9.4 9.4  MG 2.1  --   --   --   --   --   --   PHOS 1.2*  --   --   --   --   --   --    < > = values in this interval not displayed.   GFR: Estimated Creatinine Clearance: 91.2 mL/min (by C-G formula based on SCr of 0.72 mg/dL). Liver Function Tests: Recent Labs  Lab 06/30/21 1706 07/01/21 0151  AST 17 15  ALT 27 23  ALKPHOS 84 68  BILITOT 2.0* 1.3*  PROT 8.2* 7.4  ALBUMIN  4.7 4.3   No results for input(s): LIPASE, AMYLASE in the last 168 hours. No results for input(s): AMMONIA in the last 168 hours. Coagulation Profile: Recent Labs  Lab 07/01/21 0151  INR 1.0   Cardiac Enzymes: No results for input(s): CKTOTAL, CKMB, CKMBINDEX, TROPONINI in the last 168 hours. BNP (last 3 results) No results for input(s): PROBNP in the last 8760 hours. HbA1C: No results for input(s): HGBA1C in the last 72 hours. CBG: Recent Labs  Lab 07/01/21 1828 07/01/21 1929 07/01/21 2031 07/01/21 2138 07/01/21 2240  GLUCAP 174* 182* 171* 183* 174*   Lipid Profile: No results for input(s): CHOL, HDL, LDLCALC, TRIG, CHOLHDL, LDLDIRECT in the last 72 hours. Thyroid Function Tests: No results for input(s): TSH, T4TOTAL, FREET4, T3FREE, THYROIDAB in the last 72 hours. Anemia Panel: Recent Labs    07/01/21 2020  VITAMINB12 837  FOLATE 20.5   Urine analysis:    Component Value Date/Time   COLORURINE STRAW (A) 06/30/2021 2115   APPEARANCEUR CLEAR 06/30/2021 2115   LABSPEC 1.017 06/30/2021 2115   PHURINE 5.0 06/30/2021 2115   GLUCOSEU >=500 (A) 06/30/2021 2115   HGBUR MODERATE (A) 06/30/2021 2115   BILIRUBINUR NEGATIVE 06/30/2021 2115   BILIRUBINUR n 01/01/2015 1112   KETONESUR 80 (A) 06/30/2021 2115   PROTEINUR 30 (A) 06/30/2021 2115   UROBILINOGEN 0.2 01/01/2015 1112   UROBILINOGEN 0.2 10/25/2013 1541   NITRITE NEGATIVE 06/30/2021 2115   LEUKOCYTESUR NEGATIVE 06/30/2021 2115   Sepsis Labs: @LABRCNTIP (procalcitonin:4,lacticidven:4)  ) Recent Results (from the past 240 hour(s))  Resp Panel by RT-PCR (Flu A&B, Covid) Nasopharyngeal Swab     Status: Abnormal   Collection Time: 06/30/21  4:51 PM   Specimen: Nasopharyngeal Swab; Nasopharyngeal(NP) swabs in vial transport medium  Result Value Ref Range Status   SARS Coronavirus 2 by RT PCR POSITIVE (A) NEGATIVE Final    Comment: LONG,L AT 1818 ON 9.26.22 BY RUCINSKI,B (NOTE) SARS-CoV-2 target nucleic acids are  DETECTED.  The SARS-CoV-2 RNA is generally detectable  in upper respiratory specimens during the acute phase of infection. Positive results are indicative of the presence of the identified virus, but do not rule out bacterial infection or co-infection with other pathogens not detected by the test. Clinical correlation with patient history and other diagnostic information is necessary to determine patient infection status. The expected result is Negative.  Fact Sheet for Patients: EntrepreneurPulse.com.au  Fact Sheet for Healthcare Providers: IncredibleEmployment.be  This test is not yet approved or cleared by the [place] FDA and  has been authorized for detection and/or diagnosis of SARS-CoV-2 by FDA under an Emergency Use Authorization (EUA).  This EUA will remain in effect (meaning this test can be used) for the duration of  the COVID-19 dec laration under Section 564(b)(1) of the Act, 21 U.S.C. section 360bbb-3(b)(1), unless the authorization is terminated or revoked sooner.     Influenza A by PCR NEGATIVE NEGATIVE Final   Influenza B by PCR NEGATIVE NEGATIVE Final    Comment: (NOTE) The Xpert Xpress SARS-CoV-2/FLU/RSV plus assay is intended as an aid in the diagnosis of influenza from Nasopharyngeal swab specimens and should not be used as a sole basis for treatment. Nasal washings and aspirates are unacceptable for Xpert Xpress SARS-CoV-2/FLU/RSV testing.  Fact Sheet for Patients: EntrepreneurPulse.com.au  Fact Sheet for Healthcare Providers: IncredibleEmployment.be  This test is not yet approved or cleared by the [place] FDA and has been authorized for detection and/or diagnosis of SARS-CoV-2 by FDA under an Emergency Use Authorization (EUA). This EUA will remain in effect (meaning this test can be used) for the duration of the COVID-19 declaration under Section 564(b)(1) of the Act, 21  U.S.C. section 360bbb-3(b)(1), unless the authorization is terminated or revoked.  Performed at [facility], [address], [place], [place] [address]   MRSA Next Gen by PCR, Nasal     Status: None   Collection Time: 06/30/21  9:15 PM   Specimen: Nasal Mucosa; Nasal Swab  Result Value Ref Range Status   MRSA by PCR Next Gen NOT DETECTED NOT DETECTED Final    Comment: (NOTE) The GeneXpert MRSA Assay (FDA approved for NASAL specimens only), is one component of a comprehensive MRSA colonization surveillance program. It is not intended to diagnose MRSA infection nor to guide or monitor treatment for MRSA infections. Test performance is not FDA approved in patients less than 85 years old. Performed at [facility], [address] [place], [place] [address]          Radiology Studies: [facility] [address] Chest [facility] 1 View  Result Date: 06/30/2021 CLINICAL DATA:  Pt reports SHOB, abdominal pain, and weakness that started 2 days ago. weak EXAM: PORTABLE CHEST 1 VIEW COMPARISON:  Chest x-ray 10/18/2019, CT chest 05/28/2019 FINDINGS: The heart and mediastinal contours are unchanged. Aortic calcification. No focal consolidation. No pulmonary edema. No pleural effusion. No pneumothorax. No acute osseous abnormality. IMPRESSION: No active disease. Electronically Signed   By: Iven Finn M.D.   On: 06/30/2021 17:56        Scheduled Meds:  Chlorhexidine Gluconate Cloth  6 each Topical Daily   famotidine  20 mg Oral Once   feeding supplement  237 mL Oral BID BM   folic acid  1 mg Oral Daily   insulin aspart  0-5 Units Subcutaneous QHS   insulin aspart  0-9 Units Subcutaneous TID WC   insulin glargine-yfgn  10 Units Subcutaneous Q24H   multivitamin with minerals  1 tablet Oral Daily   nirmatrelvir/ritonavir EUA  3  tablet Oral BID   pantoprazole  40 mg Oral Daily   thiamine  100 mg Oral Daily   Or   thiamine  100 mg Intravenous Daily   Continuous Infusions:  dextrose 5% lactated ringers 125 mL/hr  at 07/01/21 2242   insulin 1.8 Units/hr (07/01/21 2242)   lactated ringers 125 mL/hr at 06/30/21 2100   potassium chloride 10 mEq (07/02/21 0609)     LOS: 1 day   The patient is critically ill with multiple organ systems failure and requires high complexity decision making for assessment and support, frequent evaluation and titration of therapies, application of advanced monitoring technologies and extensive interpretation of multiple databases. Critical Care Time devoted to patient care services described in this note  Time spent: 40 minutes     Alfonse Garringer, Geraldo Docker, MD [facility]   If 7PM-7AM, please contact night-coverage 07/02/2021, 6:50 AM

## 2021-07-03 DIAGNOSIS — N179 Acute kidney failure, unspecified: Secondary | ICD-10-CM | POA: Diagnosis not present

## 2021-07-03 DIAGNOSIS — E0810 Diabetes mellitus due to underlying condition with ketoacidosis without coma: Secondary | ICD-10-CM | POA: Diagnosis not present

## 2021-07-03 DIAGNOSIS — F1010 Alcohol abuse, uncomplicated: Secondary | ICD-10-CM | POA: Diagnosis not present

## 2021-07-03 LAB — COMPREHENSIVE METABOLIC PANEL
ALT: 27 U/L (ref 0–44)
AST: 49 U/L — ABNORMAL HIGH (ref 15–41)
Albumin: 3.2 g/dL — ABNORMAL LOW (ref 3.5–5.0)
Alkaline Phosphatase: 52 U/L (ref 38–126)
Anion gap: 8 (ref 5–15)
BUN: 6 mg/dL (ref 6–20)
CO2: 29 mmol/L (ref 22–32)
Calcium: 9.1 mg/dL (ref 8.9–10.3)
Chloride: 95 mmol/L — ABNORMAL LOW (ref 98–111)
Creatinine, Ser: 0.47 mg/dL — ABNORMAL LOW (ref 0.61–1.24)
GFR, Estimated: >60 mL/min (ref >60)
Glucose, Bld: 198 mg/dL — ABNORMAL HIGH (ref 70–99)
Potassium: 3.1 mmol/L — ABNORMAL LOW (ref 3.5–5.1)
Sodium: 132 mmol/L — ABNORMAL LOW (ref 135–145)
Total Bilirubin: 0.7 mg/dL (ref 0.3–1.2)
Total Protein: 5.7 g/dL — ABNORMAL LOW (ref 6.5–8.1)

## 2021-07-03 LAB — GLUCOSE, CAPILLARY
Glucose-Capillary: 238 mg/dL — ABNORMAL HIGH (ref 70–99)
Glucose-Capillary: 203 mg/dL — ABNORMAL HIGH (ref 70–99)
Glucose-Capillary: 226 mg/dL — ABNORMAL HIGH (ref 70–99)
Glucose-Capillary: 196 mg/dL — ABNORMAL HIGH (ref 70–99)

## 2021-07-03 LAB — GLUTAMIC ACID DECARBOXYLASE AUTO ABS: Glutamic Acid Decarb Ab: <5 U/mL (ref 0.0–5.0)

## 2021-07-03 LAB — MAGNESIUM: Magnesium: 1.6 mg/dL — ABNORMAL LOW (ref 1.7–2.4)

## 2021-07-03 LAB — C-PEPTIDE: C-Peptide: 0.3 ng/mL — ABNORMAL LOW (ref 1.1–4.4)

## 2021-07-03 LAB — PHOSPHORUS: Phosphorus: 1.3 mg/dL — ABNORMAL LOW (ref 2.5–4.6)

## 2021-07-03 MED ORDER — CITALOPRAM HYDROBROMIDE 20 MG PO TABS: ORAL_TABLET | ORAL | 1 refills | Status: DC

## 2021-07-03 MED ORDER — EMPAGLIFLOZIN 25 MG PO TABS: 25.0000 mg | ORAL_TABLET | Freq: Every day | ORAL | 3 refills | Status: DC

## 2021-07-03 MED ORDER — INSULIN PEN NEEDLE 32G X 4 MM MISC: 1.0000 | Freq: Four times a day (QID) | 3 refills | Status: DC

## 2021-07-03 MED ORDER — TRESIBA FLEXTOUCH 100 UNIT/ML ~~LOC~~ SOPN: 18.0000 [IU] | PEN_INJECTOR | Freq: Every day | SUBCUTANEOUS | 6 refills | Status: DC

## 2021-07-03 MED ORDER — NIRMATRELVIR/RITONAVIR (PAXLOVID)TABLET: 3.0000 | ORAL_TABLET | Freq: Two times a day (BID) | ORAL | 0 refills | Status: AC

## 2021-07-03 MED ORDER — FOLIC ACID 1 MG PO TABS: 1.0000 mg | ORAL_TABLET | Freq: Every day | ORAL | 3 refills | Status: DC

## 2021-07-03 MED ORDER — POTASSIUM PHOSPHATES 15 MMOLE/5ML IV SOLN
30.0000 mmol | Freq: Once | INTRAVENOUS | Status: AC
  Administered 2021-07-03: 30 mmol via INTRAVENOUS
  Filled 2021-07-03: qty 10

## 2021-07-03 MED ORDER — DEXCOM G6 SENSOR MISC: 1.0000 | 3 refills | Status: DC

## 2021-07-03 MED ORDER — ADULT MULTIVITAMIN W/MINERALS CH: 1.0000 | ORAL_TABLET | Freq: Every day | ORAL | 1 refills | Status: DC

## 2021-07-03 MED ORDER — MAGNESIUM SULFATE 4 GM/100ML IV SOLN
4.0000 g | Freq: Once | INTRAVENOUS | Status: AC
  Administered 2021-07-03: 4 g via INTRAVENOUS
  Filled 2021-07-03: qty 100

## 2021-07-03 MED ORDER — NOVOLOG FLEXPEN 100 UNIT/ML ~~LOC~~ SOPN
6.0000 [IU] | PEN_INJECTOR | Freq: Three times a day (TID) | SUBCUTANEOUS | 6 refills | Status: DC
Start: 2021-07-03 — End: 2022-04-18

## 2021-07-03 MED ORDER — THIAMINE HCL 100 MG PO TABS: 100.0000 mg | ORAL_TABLET | Freq: Every day | ORAL | 3 refills | Status: DC

## 2021-07-03 MED ORDER — ATORVASTATIN CALCIUM 40 MG PO TABS: 40.0000 mg | ORAL_TABLET | Freq: Every day | ORAL | 1 refills | Status: DC

## 2021-07-03 MED ORDER — ALBUTEROL SULFATE HFA 108 (90 BASE) MCG/ACT IN AERS: 2.0000 | INHALATION_SPRAY | RESPIRATORY_TRACT | 0 refills | Status: DC | PRN

## 2021-07-03 MED ORDER — GLUCOSE BLOOD VI STRP: ORAL_STRIP | 11 refills | Status: DC

## 2021-07-03 MED ORDER — POTASSIUM CHLORIDE CRYS ER 20 MEQ PO TBCR
40.0000 meq | EXTENDED_RELEASE_TABLET | Freq: Once | ORAL | Status: AC
  Administered 2021-07-03: 40 meq via ORAL
  Filled 2021-07-03: qty 2

## 2021-07-03 MED ORDER — DEXCOM G6 TRANSMITTER MISC: 1.0000 | 3 refills | Status: DC

## 2021-07-03 MED ORDER — GABAPENTIN 300 MG PO CAPS: 300.0000 mg | ORAL_CAPSULE | Freq: Three times a day (TID) | ORAL | 3 refills | Status: DC

## 2021-07-03 MED ORDER — AMLODIPINE BESY-BENAZEPRIL HCL 2.5-10 MG PO CAPS: 1.0000 | ORAL_CAPSULE | Freq: Every day | ORAL | 1 refills | Status: DC

## 2021-07-03 MED ORDER — PANCRELIPASE (LIP-PROT-AMYL) 36000-114000 UNITS PO CPEP: 36000.0000 [IU] | ORAL_CAPSULE | ORAL | 5 refills | Status: DC

## 2021-07-03 NOTE — Discharge Instructions (Signed)
1) You are strongly advised to isolate/quarantine for at least 10 days from the date of your diagnosis with COVID-19 infection--please always wear a mask if you have to go outside the house  2)Video/Virtual follow-up visit with primary care physician in about a week advised  3)Please complete Paxlovid Tablets for Covid 19 infection--as prescribed   4) abstinence from alcohol advised--- consider alcohol rehab programs including AA meetings  5) abstinence from tobacco advised May use over-the-counter nicotine patch to help you quit smoking  6) please take your insulin as prescribed to avoid worsening high sugar with possible diabetic ketoacidosis which can be deadly

## 2021-07-03 NOTE — Progress Notes (Signed)
Patient ambulating in room independently. Refusing Telemetry at this time. IV mag and phos infusing per EMAR. Vitals obtained. No s/sx of adverse reaction noted. Patient education provided on Diabetes management and ETOH withdrawal s/sx. Patient verbalized understanding, teachback completed.

## 2021-07-03 NOTE — Progress Notes (Signed)
Discharge instructions reviewed with patient. Paxlovid tabs for remainder doses given to patient to take home and finish as prescribed. All personal belongings accounted for. Discharge instructions understood well, teach back completed.

## 2021-07-03 NOTE — Discharge Summary (Signed)
Todd Rodriguez, is a 45 y.o. male  DOB 21-Oct-1975  MRN [id number].  Admission date:  06/30/2021  Admitting Physician  Orson Eva, MD  Discharge Date:  07/03/2021   Primary MD  Regis Bill, [name], MD  Recommendations for primary care physician for things to follow:   1) You are strongly advised to isolate/quarantine for at least 10 days from the date of your diagnosis with COVID-19 infection--please always wear a mask if you have to go outside the house  2)Video/Virtual follow-up visit with primary care physician in about a week advised  3)Please complete Paxlovid Tablets for Covid 19 infection--as prescribed   4) abstinence from alcohol advised--- consider alcohol rehab programs including AA meetings  5) abstinence from tobacco advised May use over-the-counter nicotine patch to help you quit smoking  6) please take your insulin as prescribed to avoid worsening high sugar with possible diabetic ketoacidosis which can be deadly   Admission Diagnosis  DKA (diabetic ketoacidosis) ([place]) [E11.10] Diabetic ketoacidosis without coma associated with diabetes mellitus due to underlying condition ([place]) [E08.10] DKA, type 2 ([place]) [E11.10]   Discharge Diagnosis  DKA (diabetic ketoacidosis) ([place]) [E11.10] Diabetic ketoacidosis without coma associated with diabetes mellitus due to underlying condition ([place]) [E08.10] DKA, type 2 ([place]) [E11.10]    Principal Problem:   DKA (diabetic ketoacidosis) ([place]) Active Problems:   Essential hypertension, benign   GERD (gastroesophageal reflux disease)   High anion gap metabolic acidosis   Alcohol abuse   Thrombocytopenia (HCC)   Hyponatremia   Hyperglycemia   Hypothermia   COVID-19 virus infection   Hyperkalemia   AKI (acute kidney injury) ([place])   Nausea & vomiting   Failure to thrive in adult   Starvation ketoacidosis   Tobacco abuse   DKA, type 2 ([place])      Past  Medical History:  Diagnosis Date   Acute pancreatitis 10/25/2013   Arthritis    Diabetes mellitus without complication (HCC)    GERD (gastroesophageal reflux disease)    Hepatic steatosis    History of pancreatitis    Hypertension    Myalgia and myositis 04/21/2014   much better from illness presumed infectious    has disability form  will complete     Pancreatic pseudocyst     Past Surgical History:  Procedure Laterality Date   ANTERIOR CERVICAL DECOMP/DISCECTOMY FUSION N/A 01/12/2018   Procedure: Revision ACDF C4-5, removal of hardware C5-6, exploration of fusion C5-6;  Surgeon: [name], MD;  Location: [facility];  Service: Orthopedics;  Laterality: N/A;  3.5 hrs   BIOPSY  05/13/2020   Procedure: BIOPSY;  Surgeon: Eloise Harman, DO;  [facility];  Service: Endoscopy;;   BIOPSY  09/23/2020   Procedure: BIOPSY;  Surgeon: Eloise Harman, DO;  [facility];  Service: Endoscopy;;   COLONOSCOPY WITH PROPOFOL N/A 05/13/2020   Dr. Abbey Chatters: Nonbleeding internal hemorrhoids.  Few small mouth diverticula in the sigmoid colon.  2 cecal polyps, inflammatory.  Sigmoid colon polyp hyperplastic.  Localized area  of granular mucosa in the transverse colon, status post biopsy which was unremarkable.  No microscopic colitis.  Plans for repeat colonoscopy in 5 years.   ESOPHAGEAL BRUSHING  05/13/2020   Procedure: ESOPHAGEAL BRUSHING;  Surgeon: Eloise Harman, DO;  [facility];  Service: Endoscopy;;  mid esophagus   ESOPHAGOGASTRODUODENOSCOPY (EGD) WITH PROPOFOL N/A 05/13/2020   Dr. Abbey Chatters: Moderately severe esophagitis with no bleeding found in the middle third of the esophagus, suspicious for Candida esophagitis.  KOH was negative. Gastritis, duodenal polyp.  Duodenal biopsy benign.  Duodenal bulb biopsy with Brunner's gland hyperplasia and mild changes suggestive of peptic injury.  No celiac disease seen.  GE junction biopsy showed GERD with mild inflammation consisten    ESOPHAGOGASTRODUODENOSCOPY (EGD) WITH PROPOFOL N/A 09/23/2020   Procedure: ESOPHAGOGASTRODUODENOSCOPY (EGD) WITH PROPOFOL;  Surgeon: Eloise Harman, DO;  [facility];  Service: Endoscopy;  Laterality: N/A;  7:30am   NECK SURGERY     2 disc   POLYPECTOMY  05/13/2020   Procedure: POLYPECTOMY;  Surgeon: Eloise Harman, DO;  [facility];  Service: Endoscopy;;   SPINAL FUSION        HPI  from the history and physical done on the day of admission:    Chief Complaint: Abdominal pain, elevated blood glucose level   HPI: Todd Rodriguez is a 45 y.o. male with medical history significant for type 2 diabetes mellitus, hypertension, GERD, alcohol abuse and tobacco use who presents to the emergency department due to 2-day onset of shortness of breath and elevated blood glucose level.  Patient states that he has not eaten in 3 days and he complained of nausea and vomiting x3 which was associated with shortness of breath and gait abnormality 3 days ago (9/23), he has a history of alcohol abuse and endorsed drinking minimum of half a gallon weekly and at least 12 pack of beer weekly with last consumption being 3 days ago.  He denies withdrawal symptoms when he stops drinking alcohol.  Patient states that he has not been compliant with insulin use.  He states that he obtain COVID vaccines and booster and denies any sick contact.   ED Course:  In the emergency department, temperature was 96.95F on arrival to ED.  He was tachycardic, but other vital signs are within normal range.  Work-up in the ED showed normal CBC except for thrombocytopenia, hyponatremia, hyperkalemia, bicarb < 7, BUN/creatinine 32/1.79 (baseline creatinine at 0.4-0.6).  VBG showed pH of 7.090, PCO2 20.3, PO2 49.9, bicarb 8.3 on room air, alcohol level was < 10.  Influenza A, B was negative, SARS coronavirus 2 was positive. Chest x-ray showed no active disease. Patient was started on insulin drip per Endo tool, IV  hydration was provided, Protonix was given and hospitalist was asked to admit him for further evaluation and management.    Hospital Course:     DKA -DM2--A1C 8.3--reflecting uncontrolled DM2 with hyperglycemia PTA  -DKA pathophysiology resolved patient was transitioned off IV insulin to subcu insulin -Patient admits to noncompliance with insulin therapy PTA -Compliance advised  AKI -Baseline creatinine 0.4-0.6 -Presented with serum creatinine 1.79, with hydration creatinine is back down to 0.47 which is patient's baseline  Essential hypertension -07/02/21 - Lotrel 2.5-10 mg daily    alcohol abuse -No frank DTs symptoms abstinence from alcohol advised outpatient alcohol rehab advised continue multivitamin   Tobacco abuse -Cessation discussed-OTC nicotine patch   GERD --- PPI advised    Thrombocytopenia -due to alcohol,  liver disease -Repeat CBC in a week   Positive COVID-19 -No hypoxia -Previously vaccinated, okay to complete Paxlovid COVID-19 Labs    Hypokalemia/hypophosphatemia/hypomagnesemia - -Replaced    Dispo: The patient is from: Home              Anticipated d/c is to: Home  Discharge Condition: stable  Follow UP--PCP for repeat CBC within a week   Diet and Activity recommendation:  As advised  Discharge Instructions    Discharge Instructions     Call MD for:  difficulty breathing, headache or visual disturbances   Complete by: As directed    Call MD for:  persistant dizziness or light-headedness   Complete by: As directed    Call MD for:  persistant nausea and vomiting   Complete by: As directed    Call MD for:  temperature >100.4   Complete by: As directed    Diet - low sodium heart healthy   Complete by: As directed    Discharge instructions   Complete by: As directed    1) You are strongly advised to isolate/quarantine for at least 10 days from the date of your diagnosis with COVID-19 infection--please always wear a mask if you have to  go outside the house  2)Video/Virtual follow-up visit with primary care physician in about a week advised  3)Please complete Paxlovid Tablets for Covid 19 infection--as prescribed   4) abstinence from alcohol advised--- consider alcohol rehab programs including AA meetings  5) abstinence from tobacco advised May use over-the-counter nicotine patch to help you quit smoking  6) please take your insulin as prescribed to avoid worsening high sugar with possible diabetic ketoacidosis which can be deadly   Increase activity slowly   Complete by: As directed          Discharge Medications     Allergies as of 07/03/2021   No Known Allergies      Medication List     STOP taking these medications    GOODYS BODY PAIN PO   Klor-Con M20 20 MEQ tablet Generic drug: potassium chloride SA       TAKE these medications    albuterol 108 (90 Base) MCG/ACT inhaler Commonly known as: VENTOLIN HFA Inhale 2 puffs into the lungs every 4 (four) hours as needed for wheezing or shortness of breath. What changed: See the new instructions.   amlodipine-benazepril 2.5-10 MG capsule Commonly known as: LOTREL Take 1 capsule by mouth daily. For high blood pressure .Dose adjustment   atorvastatin 40 MG tablet Commonly known as: LIPITOR Take 1 tablet (40 mg total) by mouth daily.   citalopram 20 MG tablet Commonly known as: CELEXA Take 10 mg per day for 1-2 weeks then increase to 20 mg per day or as directed   Dexcom G6 Sensor Misc 1 Device by Does not apply route as directed.   Dexcom G6 Transmitter Misc 1 Device by Does not apply route as directed.   dicyclomine 10 MG capsule Commonly known as: BENTYL TAKE 1 CAPSULE FOUR TIMES A DAY BEFORE MEALS AND AT BEDTIME AS NEEDED FOR ABDOMINAL CRAMPING   empagliflozin 25 MG Tabs tablet Commonly known as: JARDIANCE Take 1 tablet (25 mg total) by mouth daily.   folic acid 1 MG tablet Commonly known as: FOLVITE Take 1 tablet (1 mg total) by  mouth daily. Start taking on: July 04, 2021 What changed:  medication strength how much to take   gabapentin 300 MG capsule Commonly known as: NEURONTIN Take 1  capsule (300 mg total) by mouth 3 (three) times daily.   glucose blood test strip Commonly known as: ONE TOUCH TEST STRIPS Use as instructed   Insulin Pen Needle 32G X 4 MM Misc 1 Device by Does not apply route in the morning, at noon, in the evening, and at bedtime.   lipase/protease/amylase 36000 UNITS Cpep capsule Commonly known as: Creon Take 1-2 capsules (36,000-72,000 Units total) by mouth See admin instructions. Take two with meals and one with snacks (total of 8 per day)   LORazepam 1 MG tablet Commonly known as: Ativan Take 1 tablet (1 mg total) by mouth every 8 (eight) hours as needed for anxiety. Panic attacks   Lumify 0.025 % Soln Generic drug: Brimonidine Tartrate Place 1 drop into both eyes daily as needed (eye irritation/dry eyes.).   magnesium oxide 400 (240 Mg) MG tablet Commonly known as: MAG-OX Take 1 tablet by mouth daily.   multivitamin with minerals Tabs tablet Take 1 tablet by mouth daily. Start taking on: July 04, 2021   nirmatrelvir/ritonavir EUA 20 x 150 MG & 10 x 100MG Tabs Commonly known as: PAXLOVID Take 3 tablets by mouth 2 (two) times daily for 5 days. Take nirmatrelvir (150 mg) two tablets twice daily for 5 days and ritonavir (100 mg) one tablet twice daily for 5 days.   NovoLOG FlexPen 100 UNIT/ML FlexPen Generic drug: insulin aspart Inject 6 Units into the skin 3 (three) times daily with meals.   ondansetron 4 MG disintegrating tablet Commonly known as: Zofran ODT Take 1 tablet (4 mg total) by mouth every 8 (eight) hours as needed for nausea or vomiting.   pantoprazole 40 MG tablet Commonly known as: PROTONIX Take 1 tablet (40 mg total) by mouth daily.   thiamine 100 MG tablet Take 1 tablet (100 mg total) by mouth daily. Start taking on: July 04, 2021    Tyler Aas FlexTouch 100 UNIT/ML FlexTouch Pen Generic drug: insulin degludec Inject 18 Units into the skin daily.   VITAMIN B-12 PO Take 1 tablet by mouth daily.        Major procedures and Radiology Reports - PLEASE review detailed and final reports for all details, in brief -    DG Chest Port 1 View  Result Date: 06/30/2021 CLINICAL DATA:  Pt reports SHOB, abdominal pain, and weakness that started 2 days ago. weak EXAM: PORTABLE CHEST 1 VIEW COMPARISON:  Chest x-ray 10/18/2019, CT chest 05/28/2019 FINDINGS: The heart and mediastinal contours are unchanged. Aortic calcification. No focal consolidation. No pulmonary edema. No pleural effusion. No pneumothorax. No acute osseous abnormality. IMPRESSION: No active disease. Electronically Signed   By: Iven Finn M.D.   On: 06/30/2021 17:56    Micro Results  Recent Results (from the past 240 hour(s))  Resp Panel by RT-PCR (Flu A&B, Covid) Nasopharyngeal Swab     Status: Abnormal   Collection Time: 06/30/21  4:51 PM   Specimen: Nasopharyngeal Swab; Nasopharyngeal(NP) swabs in vial transport medium  Result Value Ref Range Status   SARS Coronavirus 2 by RT PCR POSITIVE (A) NEGATIVE Final    Comment: LONG,L AT 1818 ON 9.26.22 BY RUCINSKI,B (NOTE) SARS-CoV-2 target nucleic acids are DETECTED.  The SARS-CoV-2 RNA is generally detectable in upper respiratory specimens during the acute phase of infection. Positive results are indicative of the presence of the identified virus, but do not rule out bacterial infection or co-infection with other pathogens not detected by the test. Clinical correlation with patient history and other diagnostic information is  necessary to determine patient infection status. The expected result is Negative.  Fact Sheet for Patients: EntrepreneurPulse.com.au  Fact Sheet for Healthcare Providers: IncredibleEmployment.be  This test is not yet approved or cleared by the  [place] FDA and  has been authorized for detection and/or diagnosis of SARS-CoV-2 by FDA under an Emergency Use Authorization (EUA).  This EUA will remain in effect (meaning this test can be used) for the duration of  the COVID-19 dec laration under Section 564(b)(1) of the Act, 21 U.S.C. section 360bbb-3(b)(1), unless the authorization is terminated or revoked sooner.     Influenza A by PCR NEGATIVE NEGATIVE Final   Influenza B by PCR NEGATIVE NEGATIVE Final    Comment: (NOTE) The Xpert Xpress SARS-CoV-2/FLU/RSV plus assay is intended as an aid in the diagnosis of influenza from Nasopharyngeal swab specimens and should not be used as a sole basis for treatment. Nasal washings and aspirates are unacceptable for Xpert Xpress SARS-CoV-2/FLU/RSV testing.  Fact Sheet for Patients: EntrepreneurPulse.com.au  Fact Sheet for Healthcare Providers: IncredibleEmployment.be  This test is not yet approved or cleared by the [place] FDA and has been authorized for detection and/or diagnosis of SARS-CoV-2 by FDA under an Emergency Use Authorization (EUA). This EUA will remain in effect (meaning this test can be used) for the duration of the COVID-19 declaration under Section 564(b)(1) of the Act, 21 U.S.C. section 360bbb-3(b)(1), unless the authorization is terminated or revoked.  Performed at [facility], [address] [place], [place] [address]   MRSA Next Gen by PCR, Nasal     Status: None   Collection Time: 06/30/21  9:15 PM   Specimen: Nasal Mucosa; Nasal Swab  Result Value Ref Range Status   MRSA by PCR Next Gen NOT DETECTED NOT DETECTED Final    Comment: (NOTE) The GeneXpert MRSA Assay (FDA approved for NASAL specimens only), is one component of a comprehensive MRSA colonization surveillance program. It is not intended to diagnose MRSA infection nor to guide or monitor treatment for MRSA infections. Test performance is not FDA  approved in patients less than 57 years old. Performed at [facility], [address] [place], [place] [address]        Today   Subjective    Todd Rodriguez today has no new complaints  No fever  Or chills   No Nausea, Vomiting or Diarrhea -- Eating and drinking well no new concerns            Patient has been seen and examined prior to discharge   Objective   Blood pressure 118/80, pulse 100, temperature 98 F (36.7 C), temperature source Oral, resp. rate 18, height 6' 2"  (1.88 m), weight 56.5 kg, SpO2 100 %.   Intake/Output Summary (Last 24 hours) at 07/03/2021 1529 Last data filed at 07/03/2021 0400 Gross per 24 hour  Intake 6.42 ml  Output 4600 ml  Net -4593.58 ml    Exam Gen:- Awake Alert, no acute distress  HEENT:- [place].AT, No sclera icterus Neck-Supple Neck,No JVD,.  Lungs-  CTAB , good air movement bilaterally  CV- S1, S2 normal, regular Abd-  +ve B.Sounds, Abd Soft, No tenderness,    Extremity/Skin:- No  edema,   good pulses Psych-affect is appropriate, oriented x3 Neuro-no new focal deficits, no significant tremors    Data Review   CBC w Diff:  Lab Results  Component Value Date   WBC 6.7 07/01/2021   HGB 14.3 07/01/2021   HCT 43.2 07/01/2021   PLT 49 (L)  07/01/2021   LYMPHOPCT 4 06/30/2021   MONOPCT 5 06/30/2021   EOSPCT 0 06/30/2021   BASOPCT 0 06/30/2021    CMP:  Lab Results  Component Value Date   NA 132 (L) 07/03/2021   NA 140 11/25/2019   K 3.1 (L) 07/03/2021   CL 95 (L) 07/03/2021   CO2 29 07/03/2021   BUN 6 07/03/2021   BUN 12 11/25/2019   CREATININE 0.47 (L) 07/03/2021   GLU 194 12/23/2020   PROT 5.7 (L) 07/03/2021   ALBUMIN 3.2 (L) 07/03/2021   BILITOT 0.7 07/03/2021   ALKPHOS 52 07/03/2021   AST 49 (H) 07/03/2021   ALT 27 07/03/2021  .   Total Discharge time is about 33 minutes  Roxan Hockey M.D on 07/03/2021 at 3:29 PM  Go to [email or web address] -  for contact info  [facility] - Office  [phone]

## 2021-07-04 DIAGNOSIS — I10 Essential (primary) hypertension: Secondary | ICD-10-CM

## 2021-07-04 DIAGNOSIS — E785 Hyperlipidemia, unspecified: Secondary | ICD-10-CM | POA: Diagnosis not present

## 2021-07-04 DIAGNOSIS — E1165 Type 2 diabetes mellitus with hyperglycemia: Secondary | ICD-10-CM | POA: Diagnosis not present

## 2021-07-07 ENCOUNTER — Telehealth: Payer: Self-pay

## 2021-07-07 NOTE — Telephone Encounter (Signed)
Transition Care Management Unsuccessful Follow-up Telephone Call  Date of discharge and from where:  07/03/2021  [facility]   Attempts:  1st Attempt  Reason for unsuccessful TCM follow-up call:  Unable to reach patient

## 2021-07-09 ENCOUNTER — Encounter: Payer: Self-pay | Admitting: Internal Medicine

## 2021-07-09 ENCOUNTER — Telehealth: Payer: Self-pay | Admitting: Internal Medicine

## 2021-07-09 ENCOUNTER — Telehealth: Payer: Self-pay

## 2021-07-09 ENCOUNTER — Ambulatory Visit: Payer: Medicare Other | Admitting: Internal Medicine

## 2021-07-09 ENCOUNTER — Ambulatory Visit (INDEPENDENT_AMBULATORY_CARE_PROVIDER_SITE_OTHER): Payer: Medicare Other | Admitting: Internal Medicine

## 2021-07-09 ENCOUNTER — Other Ambulatory Visit: Payer: Self-pay

## 2021-07-09 ENCOUNTER — Telehealth: Payer: Self-pay | Admitting: *Deleted

## 2021-07-09 DIAGNOSIS — K219 Gastro-esophageal reflux disease without esophagitis: Secondary | ICD-10-CM | POA: Diagnosis not present

## 2021-07-09 DIAGNOSIS — R197 Diarrhea, unspecified: Secondary | ICD-10-CM | POA: Diagnosis not present

## 2021-07-09 DIAGNOSIS — E876 Hypokalemia: Secondary | ICD-10-CM

## 2021-07-09 NOTE — Telephone Encounter (Signed)
Patient called and said that you called him.  Please call him back

## 2021-07-09 NOTE — Telephone Encounter (Signed)
Pt consented to a telephone visit.

## 2021-07-09 NOTE — Telephone Encounter (Signed)
Transition Care Management Unsuccessful Follow-up Telephone Call  Date of discharge and from where:  [facility] 07/03/2021  Attempts:  2nd Attempt  Reason for unsuccessful TCM follow-up call:  Unable to reach patient

## 2021-07-09 NOTE — Telephone Encounter (Signed)
Todd Rodriguez, you are scheduled for a virtual visit with your provider today.  Just as we do with appointments in the office, we must obtain your consent to participate.  Your consent will be active for this visit and any virtual visit you may have with one of our providers in the next 365 days.  If you have a MyChart account, I can also send a copy of this consent to you electronically.  All virtual visits are billed to your insurance company just like a traditional visit in the office.  As this is a virtual visit, video technology does not allow for your provider to perform a traditional examination.  This may limit your provider's ability to fully assess your condition.  If your provider identifies any concerns that need to be evaluated in person or the need to arrange testing such as labs, EKG, etc, we will make arrangements to do so.  Although advances in technology are sophisticated, we cannot ensure that it will always work on either your end or our end.  If the connection with a video visit is poor, we may have to switch to a telephone visit.  With either a video or telephone visit, we are not always able to ensure that we have a secure connection.   I need to obtain your verbal consent now.   Are you willing to proceed with your visit today?

## 2021-07-10 ENCOUNTER — Ambulatory Visit (INDEPENDENT_AMBULATORY_CARE_PROVIDER_SITE_OTHER): Payer: Medicare Other

## 2021-07-10 DIAGNOSIS — E785 Hyperlipidemia, unspecified: Secondary | ICD-10-CM

## 2021-07-10 DIAGNOSIS — E1165 Type 2 diabetes mellitus with hyperglycemia: Secondary | ICD-10-CM

## 2021-07-10 DIAGNOSIS — F17200 Nicotine dependence, unspecified, uncomplicated: Secondary | ICD-10-CM

## 2021-07-10 DIAGNOSIS — F1010 Alcohol abuse, uncomplicated: Secondary | ICD-10-CM

## 2021-07-10 DIAGNOSIS — I10 Essential (primary) hypertension: Secondary | ICD-10-CM

## 2021-07-10 NOTE — Progress Notes (Signed)
Referring Provider:  Primary Care Physician:  Burnis Medin, MD  Primary GI: Dr. Abbey Chatters   Patient Location: Home   Provider Location: [facility] office   Reason for Visit: Follow up     Total time (minutes) spent on medical discussion: >21 minutes   Due to COVID-19, visit was conducted using virtual method.  Visit was requested by patient.  Virtual Visit via MyChart Video Note Due to COVID-19, visit is conducted virtually and was requested by patient.   I connected with Todd Rodriguez on 07/10/21 at  2:30 PM EDT by telephone and verified that I am speaking with the correct person using two identifiers.   I discussed the limitations, risks, security and privacy concerns of performing an evaluation and management service by telephone and the availability of in person appointments. I also discussed with the patient that there may be a patient responsible charge related to this service. The patient expressed understanding and agreed to proceed.  Chief Complaint  Patient presents with   frequent bm's    Stool isn't loose   Gastroesophageal Reflux    Doing ok     History of Present Illness: Patient is a pleasant 45 year old male who presents today for follow-up visit.  Has a history of chronic GERD, weight loss, rectal bleeding, pancreatic pseudocyst, hepatic steatosis, gallstones versus gallbladder, chronic alcohol abuse.  EGD and colonoscopy in August 2021.  Noted to have moderately severe esophagitis with no bleeding found in the middle third of the esophagus, suspicious for Candida esophagitis.  KOH was negative.  He was treated with 2 weeks of Diflucan.  GE junction biopsy showed GERD with mild inflammation consistent with reflux.  Patient had gastritis.  Duodenal biopsies benign. Duodenal polyp biopsy, Brunner's gland hyperplasia and mild changes suggestive of peptic injury. No findings consistent with celiac.   Repeat EGD December 2021 to document healing and screen for Barrett's,  he had irregular Z-line but negative biopsy for Barrett's esophagus.  He did have some ongoing chronic inflammation of the esophagus but much improved from prior EGD.  Gastritis noted.  Advised to continue daily pantoprazole.  Colonoscopy August 2021 with 2 cecal polyps removed, inflammatory, 1 sigmoid polyp removed, hyperplastic, granular mucosa in the transverse colon biopsy negative.  Has a history of pancreatic pseudocyst which has been followed with MRIs, most recent MRI January 2022 which showed no significant change, recommended against further surveillance imaging as this has been stable.  Chronic GERD well-controlled on pantoprazole daily.  Rectal bleeding has resolved.    Does have history of loose stools which is controlled on pancreatic enzymes. That being said he was recently admitted to our local facility for COVID-19, discharged 07/03/2021.  States since his discharge she has had profuse diarrhea.  Notes up to 20 loose bowel movements daily.  Does not appear that he received antibiotics in the hospital.    Past Medical History:  Diagnosis Date   Acute pancreatitis 10/25/2013   Arthritis    Diabetes mellitus without complication (HCC)    GERD (gastroesophageal reflux disease)    Hepatic steatosis    History of pancreatitis    Hypertension    Myalgia and myositis 04/21/2014   much better from illness presumed infectious    has disability form  will complete     Pancreatic pseudocyst      Past Surgical History:  Procedure Laterality Date   ANTERIOR CERVICAL DECOMP/DISCECTOMY FUSION N/A 01/12/2018   Procedure: Revision ACDF C4-5, removal of hardware C5-6, exploration of  fusion C5-6;  Surgeon: [name], MD;  Location: [facility];  Service: Orthopedics;  Laterality: N/A;  3.5 hrs   BIOPSY  05/13/2020   Procedure: BIOPSY;  Surgeon: Eloise Harman, DO;  [facility];  Service: Endoscopy;;   BIOPSY  09/23/2020   Procedure: BIOPSY;  Surgeon: Eloise Harman, DO;   [facility]: [facility];  Service: Endoscopy;;   COLONOSCOPY WITH PROPOFOL N/A 05/13/2020   Dr. Abbey Chatters: Nonbleeding internal hemorrhoids.  Few small mouth diverticula in the sigmoid colon.  2 cecal polyps, inflammatory.  Sigmoid colon polyp hyperplastic.  Localized area of granular mucosa in the transverse colon, status post biopsy which was unremarkable.  No microscopic colitis.  Plans for repeat colonoscopy in 5 years.   ESOPHAGEAL BRUSHING  05/13/2020   Procedure: ESOPHAGEAL BRUSHING;  Surgeon: Eloise Harman, DO;  Location: [facility];  Service: Endoscopy;;  mid esophagus   ESOPHAGOGASTRODUODENOSCOPY (EGD) WITH PROPOFOL N/A 05/13/2020   Dr. Abbey Chatters: Moderately severe esophagitis with no bleeding found in the middle third of the esophagus, suspicious for Candida esophagitis.  KOH was negative. Gastritis, duodenal polyp.  Duodenal biopsy benign.  Duodenal bulb biopsy with Brunner's gland hyperplasia and mild changes suggestive of peptic injury.  No celiac disease seen.  GE junction biopsy showed GERD with mild inflammation consisten   ESOPHAGOGASTRODUODENOSCOPY (EGD) WITH PROPOFOL N/A 09/23/2020   Procedure: ESOPHAGOGASTRODUODENOSCOPY (EGD) WITH PROPOFOL;  Surgeon: Eloise Harman, DO;  [facility]: [facility];  Service: Endoscopy;  Laterality: N/A;  7:30am   NECK SURGERY     2 disc   POLYPECTOMY  05/13/2020   Procedure: POLYPECTOMY;  Surgeon: Eloise Harman, DO;  Location: [facility];  Service: Endoscopy;;   SPINAL FUSION       Current Meds  Medication Sig   albuterol (VENTOLIN HFA) 108 (90 Base) MCG/ACT inhaler Inhale 2 puffs into the lungs every 4 (four) hours as needed for wheezing or shortness of breath.   amlodipine-benazepril (LOTREL) 2.5-10 MG capsule Take 1 capsule by mouth daily. For high blood pressure .Dose adjustment   atorvastatin (LIPITOR) 40 MG tablet Take 1 tablet (40 mg total) by mouth daily.   Brimonidine Tartrate (LUMIFY) 0.025 % SOLN Place 1 drop into both eyes daily  as needed (eye irritation/dry eyes.).   citalopram (CELEXA) 20 MG tablet Take 10 mg per day for 1-2 weeks then increase to 20 mg per day or as directed   Continuous Blood Gluc Sensor (DEXCOM G6 SENSOR) MISC 1 Device by Does not apply route as directed.   Continuous Blood Gluc Transmit (DEXCOM G6 TRANSMITTER) MISC 1 Device by Does not apply route as directed.   Cyanocobalamin (VITAMIN B-12 PO) Take 1 tablet by mouth daily.   dicyclomine (BENTYL) 10 MG capsule TAKE 1 CAPSULE FOUR TIMES A DAY BEFORE MEALS AND AT BEDTIME AS NEEDED FOR ABDOMINAL CRAMPING   empagliflozin (JARDIANCE) 25 MG TABS tablet Take 1 tablet (25 mg total) by mouth daily.   folic acid (FOLVITE) 1 MG tablet Take 1 tablet (1 mg total) by mouth daily.   gabapentin (NEURONTIN) 300 MG capsule Take 1 capsule (300 mg total) by mouth 3 (three) times daily.   glucose blood (ONE TOUCH TEST STRIPS) test strip Use as instructed   insulin aspart (NOVOLOG FLEXPEN) 100 UNIT/ML FlexPen Inject 6 Units into the skin 3 (three) times daily with meals.   Insulin Pen Needle 32G X 4 MM MISC 1 Device by Does not apply route in the morning, at  noon, in the evening, and at bedtime.   lipase/protease/amylase (CREON) 36000 UNITS CPEP capsule Take 1-2 capsules (36,000-72,000 Units total) by mouth See admin instructions. Take two with meals and one with snacks (total of 8 per day)   LORazepam (ATIVAN) 1 MG tablet Take 1 tablet (1 mg total) by mouth every 8 (eight) hours as needed for anxiety. Panic attacks   magnesium oxide (MAG-OX) 400 (240 Mg) MG tablet Take 1 tablet by mouth daily.   Multiple Vitamin (MULTIVITAMIN WITH MINERALS) TABS tablet Take 1 tablet by mouth daily.   ondansetron (ZOFRAN ODT) 4 MG disintegrating tablet Take 1 tablet (4 mg total) by mouth every 8 (eight) hours as needed for nausea or vomiting.   pantoprazole (PROTONIX) 40 MG tablet Take 1 tablet (40 mg total) by mouth daily.   thiamine 100 MG tablet Take 1 tablet (100 mg total) by mouth  daily.   TRESIBA FLEXTOUCH 100 UNIT/ML FlexTouch Pen Inject 18 Units into the skin daily.     Family History  Problem Relation Age of Onset   Diabetes Mother    Hypertension Mother    Diabetes Father    Hypertension Father    Arthritis Maternal Grandmother    Hypertension Maternal Grandfather    Diabetes Maternal Grandfather    Colon cancer Neg Hx    Pancreatitis Neg Hx    Pancreatic cancer Neg Hx     Social History   Socioeconomic History   Marital status: Married    Spouse name: Not on file   Number of children: 1   Years of education: Not on file   Highest education level: Not on file  Occupational History    Employer: [employer]  Tobacco Use   Smoking status: Every Day    Packs/day: 1.00    Years: 12.00    Pack years: 12.00    Types: Cigarettes   Smokeless tobacco: Never  Vaping Use   Vaping Use: Never used  Substance and Sexual Activity   Alcohol use: Not Currently    Comment: every weekend, one drink. 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             Social Determinants of Health   Financial Resource Strain: Low Risk    Difficulty of Paying Living Expenses: Not hard at all  Food Insecurity: No Food Insecurity   Worried About Charity fundraiser in the Last Year: Never true   [place] in the Last Year: Never true  Transportation Needs: No Transportation Needs   Lack of Transportation (Medical): No   Lack of Transportation (Non-Medical): No  Physical Activity: Insufficiently Active   Days of Exercise per Week: 3 days   Minutes of Exercise per  Session: 30 min  Stress: No Stress Concern Present   Feeling of Stress : Not at all  Social Connections: Socially Integrated   Frequency of Communication with Friends and Family: Three times a week  Frequency of Social Gatherings with Friends and Family: Three times a week   Attends Religious Services: More than 4 times per year   Active Member of Clubs or Organizations: Yes   Attends Music therapist: More than 4 times per year   Marital Status: Married       Review of Systems: Gen: Denies fever, chills, anorexia. Denies fatigue, weakness, weight loss.  CV: Denies chest pain, palpitations, syncope, peripheral edema, and claudication. Resp: Denies dyspnea at rest, cough, wheezing, coughing up blood, and pleurisy. GI: see HPI Derm: Denies rash, itching, dry skin Psych: Denies depression, anxiety, memory loss, confusion. No homicidal or suicidal ideation.  Heme: Denies bruising, bleeding, and enlarged lymph nodes.  Observations/Objective: No distress. Unable to perform physical exam due to telephone encounter. No video available.   Assessment and Plan: *GERD-well-controlled on pantoprazole *Pancreatic pseudocyst-stable, no further imaging recommended *Diarrhea-new, worsening *Chronic alcohol abuse *Hypokalemia   Follow Up Instructions: GERD well-controlled on pantoprazole, will continue.  No further imaging recommended for pancreatic pseudocyst.  Counseled on importance of alcohol cessation.  Unclear etiology of his worsening diarrhea which just started 5 days ago.  Continue on pancreatic enzyme replacement.  Will check stool studies to rule out infectious pathogens.  I will also recheck his electrolytes and kidney function given worsening diarrhea and recent hypokalemia  Follow-up in 3 months.   I discussed the assessment and treatment plan with the patient. The patient was provided an opportunity to ask questions and all were answered. The patient agreed  with the plan and demonstrated an understanding of the instructions.   The patient was advised to call back or seek an in-person evaluation if the symptoms worsen or if the condition fails to improve as anticipated.  I provided >21 minutes of face-to-face time during this MyChart Video encounter.

## 2021-07-10 NOTE — Telephone Encounter (Signed)
Spoke with patient and will try and call [facility] and see why order was canceled

## 2021-07-10 NOTE — Chronic Care Management (AMB) (Signed)
Chronic Care Management   CCM RN Visit Note  07/10/2021 Name: Todd Rodriguez MRN: [id number] DOB: 20-May-1976  Subjective: Todd Rodriguez is a 45 y.o. year old male who is a primary care patient of Panosh, Standley Brooking, MD. The care management team was consulted for assistance with disease management and care coordination needs.    Engaged with patient by telephone for follow up visit in response to provider referral for case management and/or care coordination services.   Consent to Services:  The patient was given information about Chronic Care Management services, agreed to services, and gave verbal consent prior to initiation of services.  Please see initial visit note for detailed documentation.   Patient agreed to services and verbal consent obtained.   Assessment: Review of patient past medical history, allergies, medications, health status, including review of consultants reports, laboratory and other test data, was performed as part of comprehensive evaluation and provision of chronic care management services.   SDOH (Social Determinants of Health) assessments and interventions performed:    CCM Care Plan  No Known Allergies  Outpatient Encounter Medications as of 07/10/2021  Medication Sig   albuterol (VENTOLIN HFA) 108 (90 Base) MCG/ACT inhaler Inhale 2 puffs into the lungs every 4 (four) hours as needed for wheezing or shortness of breath.   amlodipine-benazepril (LOTREL) 2.5-10 MG capsule Take 1 capsule by mouth daily. For high blood pressure .Dose adjustment   atorvastatin (LIPITOR) 40 MG tablet Take 1 tablet (40 mg total) by mouth daily.   Brimonidine Tartrate (LUMIFY) 0.025 % SOLN Place 1 drop into both eyes daily as needed (eye irritation/dry eyes.).   citalopram (CELEXA) 20 MG tablet Take 10 mg per day for 1-2 weeks then increase to 20 mg per day or as directed   Continuous Blood Gluc Sensor (DEXCOM G6 SENSOR) MISC 1 Device by Does not apply route as directed.   Continuous  Blood Gluc Transmit (DEXCOM G6 TRANSMITTER) MISC 1 Device by Does not apply route as directed.   Cyanocobalamin (VITAMIN B-12 PO) Take 1 tablet by mouth daily.   dicyclomine (BENTYL) 10 MG capsule TAKE 1 CAPSULE FOUR TIMES A DAY BEFORE MEALS AND AT BEDTIME AS NEEDED FOR ABDOMINAL CRAMPING   empagliflozin (JARDIANCE) 25 MG TABS tablet Take 1 tablet (25 mg total) by mouth daily.   folic acid (FOLVITE) 1 MG tablet Take 1 tablet (1 mg total) by mouth daily.   gabapentin (NEURONTIN) 300 MG capsule Take 1 capsule (300 mg total) by mouth 3 (three) times daily.   glucose blood (ONE TOUCH TEST STRIPS) test strip Use as instructed   insulin aspart (NOVOLOG FLEXPEN) 100 UNIT/ML FlexPen Inject 6 Units into the skin 3 (three) times daily with meals.   Insulin Pen Needle 32G X 4 MM MISC 1 Device by Does not apply route in the morning, at noon, in the evening, and at bedtime.   lipase/protease/amylase (CREON) 36000 UNITS CPEP capsule Take 1-2 capsules (36,000-72,000 Units total) by mouth See admin instructions. Take two with meals and one with snacks (total of 8 per day)   LORazepam (ATIVAN) 1 MG tablet Take 1 tablet (1 mg total) by mouth every 8 (eight) hours as needed for anxiety. Panic attacks   magnesium oxide (MAG-OX) 400 (240 Mg) MG tablet Take 1 tablet by mouth daily.   Multiple Vitamin (MULTIVITAMIN WITH MINERALS) TABS tablet Take 1 tablet by mouth daily.   ondansetron (ZOFRAN ODT) 4 MG disintegrating tablet Take 1 tablet (4 mg total) by mouth every 8 (eight)  hours as needed for nausea or vomiting.   pantoprazole (PROTONIX) 40 MG tablet Take 1 tablet (40 mg total) by mouth daily.   thiamine 100 MG tablet Take 1 tablet (100 mg total) by mouth daily.   TRESIBA FLEXTOUCH 100 UNIT/ML FlexTouch Pen Inject 18 Units into the skin daily.   No facility-administered encounter medications on file as of 07/10/2021.    Patient Active Problem List   Diagnosis Date Noted   DKA, type 2 ([place]) 07/01/2021   DKA  (diabetic ketoacidosis) ([place]) 06/30/2021   Hyponatremia 06/30/2021   Hyperglycemia 06/30/2021   Hypothermia 06/30/2021   COVID-19 virus infection 06/30/2021   Hyperkalemia 06/30/2021   AKI (acute kidney injury) ([place]) 06/30/2021   Nausea & vomiting 06/30/2021   Failure to thrive in adult 06/30/2021   Starvation ketoacidosis 06/30/2021   Tobacco abuse 06/30/2021   Loose stools 08/27/2020   Abnormal weight loss 04/26/2020   Rectal bleeding 04/26/2020   Pancreatic pseudocyst    Protein-calorie malnutrition, severe 05/29/2019   High anion gap metabolic acidosis 40/98/1191   Alcohol abuse 05/28/2019   Thrombocytopenia ([place]) 05/28/2019   Pancreatic cyst 05/28/2019   Hepatic lesion 05/28/2019   Dyslipidemia 05/11/2019   Abnormal LFTs 09/12/2018   Medication management 09/12/2018   S/P cervical spinal fusion 01/12/2018   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   Carpal tunnel syndrome of left wrist 12/10/2017   Cervical radiculopathy 11/11/2017   History of fusion of cervical spine 10/22/2017   Cramps, extremity 06/25/2015   Diabetes mellitus type 2, uncontrolled, without complications 47/82/9562   Hand cramps 01/25/2015   Fever, unspecified 03/26/2014   Hx of acute pancreatitis 01/03/2014   Tobacco dependence 01/03/2014   Other and unspecified hyperlipidemia 01/03/2014   GERD (gastroesophageal reflux disease) 11/22/2013   Diabetes mellitus type 2, controlled, without complications ([place]) 13/05/6577   Essential hypertension, benign 10/25/2013   Leukocytosis, unspecified 10/25/2013    Conditions to be addressed/monitored:HTN, HLD, DMII, and alcohol abuse  Care Plan : Diabetes Type 2 (Adult)  Updates made by Dimitri Ped, RN since 07/10/2021 12:00 AM     Problem: Lack of long term self management of Type 2 Diabetes   Priority: High     Long-Range Goal: Effective long term self management of Type 2 Diabetes   Start Date: 06/12/2021  Expected End Date:  12/03/2021  This Visit's Progress: On track  Recent Progress: On track  Priority: High  Note:   Objective:  Lab Results  Component Value Date   HGBA1C 10.9 (H) 04/30/2021   Lab Results  Component Value Date   CREATININE 0.42 04/30/2021   CREATININE 0.64 12/23/2020   CREATININE 0.59 (L) 05/10/2020   No results found for: EGFR Current Barriers:  Knowledge Deficits related to basic Diabetes pathophysiology and self care/management Knowledge Deficits related to medications used for management of diabetes Unable to independently self manage Type 2 Diabetes Does not adhere to provider recommendations re: diet Pt was admitted to hospital 06/30/21-07/03/21 for DKA and was COVID positive. States he has not missed taking his meal time insulin and the [place] since he came home from the hospital.  States he is checking his CBG twice a day States that his CBG readings have ranged from 130-200.  Denies any hypoglycemia.  States he is trying to eat less bread and more protein.  States he is drinking 2 Ensures a day to try to gain some of his weight back.  States he has not drank any alcohol since  he came home. States he had a telephone visit yesterday with his GI doctor for his loose stools.  States he is going to get labs done when he is out of quarantine next week.  States he has completed his Paxlovid    Case Manager Clinical Goal(s):  patient will demonstrate improved adherence to prescribed treatment plan for diabetes self care/management as evidenced by: daily monitoring and recording of CBG  adherence to ADA/ carb modified diet adherence to prescribed medication regimen contacting provider for new or worsened symptoms or questions Interventions:  Collaboration with Panosh, Standley Brooking, MD regarding development and update of comprehensive plan of care as evidenced by provider attestation and co-signature Inter-disciplinary care team collaboration (see longitudinal plan of care) Reviewed education to  patient about basic DM disease process Reviewed medications with patient and discussed importance of medication adherence Discussed plans with patient for ongoing care management follow up and provided patient with direct contact information for care management team Reviewed s/sx of hypoglycemia and hyperglycemia and importance of correct treatment Reviewed scheduled/upcoming provider appointments including:  Dr. Kelton Pillar 08/22/21, Dr. Regis Bill 08/25/21 Reinforced to check cbg twice a day and record, calling provider for findings outside established parameters.   Review of patient status, including review of consultants reports, relevant laboratory and other test results, and medications completed. Reviewed importance of taking his insulin as ordered and risks if he goes without insulin Reviewed to drink adequate amounts of water and non sugar sweetened beverages Reviewed to availability of treatment programs such as AA but declines at this time Reinforced to try drinking diabetic nutritional supplements to help gain weight Reviewed to avoid drinking any alcohol or sugar sweetened drinks  Self-Care Activities - Self administers oral medications as prescribed Self administers insulin as prescribed Attends all scheduled provider appointments Checks blood sugars as prescribed and utilize hyper and hypoglycemia protocol as needed Adheres to prescribed ADA/carb modified Patient Goals: - check blood sugar at prescribed times - check blood sugar if I feel it is too high or too low - enter blood sugar readings and medication or insulin into daily log - take the blood sugar log to all doctor visits - take the blood sugar meter to all doctor visits - change to whole grain breads, cereal, pasta - drink 6 to 8 glasses of water each day - fill half of plate with vegetables - limit fast food meals to no more than 1 per week - manage portion size - prepare main meal at home 3 to 5 days each week -  read food labels for fat, fiber, carbohydrates and portion size - switch to low-fat or skim milk - switch to sugar-free drinks - keep appointment with eye doctor - check feet daily for cuts, sores or redness - keep feet up while sitting - trim toenails straight across - wash and dry feet carefully every day - wear comfortable, cotton socks - wear comfortable, well-fitting shoes -Switch to diabetic nutritional supplements Follow Up Plan: Telephone follow up appointment with care management team member scheduled for: 08/14/21 at 3:30 PM The patient has been provided with contact information for the care management team and has been advised to call with any health related questions or concerns.      Care Plan : Cardiovascular disease  (HTN and HLD)  Updates made by Dimitri Ped, RN since 07/10/2021 12:00 AM     Problem: Need for self management of Cardiovascular disease  (HTN and HLD)   Priority: Medium  Long-Range Goal: Effective self managment of Cardiovascular disease (HTN and HLD)   Start Date: 06/12/2021  Expected End Date: 12/03/2021  This Visit's Progress: On track  Recent Progress: On track  Priority: Medium  Note:   Current Barriers:  Knowledge Deficits related to basic understanding of Cardiovascular disease (HTN and HLD) pathophysiology and self care management Unable to independently self manage Cardiovascular disease (HTN and HLD) States he has been checking his B/P every day since he came home from the hospital.  States it was 120/80 today.  States he does not add salt to his food and tries to avoid foods with more salt Nurse Case Manager Clinical Goal(s):  patient will verbalize understanding of plan for Cardiovascular disease (HTN and HLD) management patient will not experience hospital admission. Hospital Admissions in last 6 months = 2 patient will attend all scheduled medical appointments:  Dr. Kelton Pillar 08/22/21, Dr. Regis Bill 08/25/21 patient will demonstrate  improved adherence to prescribed treatment plan for hypertension as evidenced by taking all medications as prescribed, monitoring and recording blood pressure as directed, adhering to low sodium/DASH diet patient will demonstrate improved health management independence as evidenced by checking blood pressure as directed and notifying PCP if SBP>160 or DBP > 90, taking all medications as prescribe, and adhering to a low sodium diet as discussed. patient will verbalize basic understanding of Cardiovascular disease (HTN and HLD) disease process and self health management plan as evidenced by readings within limits, adherence to plan of care Interventions:  Collaboration with Panosh, Standley Brooking, MD regarding development and update of comprehensive plan of care as evidenced by provider attestation and co-signature Inter-disciplinary care team collaboration (see longitudinal plan of care) Evaluation of current treatment plan related to hypertension self management and patient's adherence to plan as established by provider. Reinforced education to patient re: stroke prevention, s/s of heart attack and stroke, DASH diet, complications of uncontrolled blood pressure Reviewed medications with patient and discussed importance of compliance Discussed plans with patient for ongoing care management follow up and provided patient with direct contact information for care management team Reinforced to monitor blood pressure daily and record, calling PCP for findings outside established parameters.  Reviewed scheduled/upcoming provider appointments including: Dr. Kelton Pillar 08/22/21, Dr. Regis Bill 08/25/21 Reviewed to avoid saturated fats, trans-fats and eat more fiber Reviewed to take statin as ordered Reviewed to lower fatty foods, red meat, cheese, milk and increase fiber like whole grains and veggies. Reviewed to try to cut back or stop smoking Self-Care Activities:  Patient verbalizes understanding of plan to self  manage Cardiovascular disease (HTN and HLD)  Self administers medications as prescribed Attends all scheduled provider appointments Calls pharmacy for medication refills Calls provider office for new concerns or questions Patient Goals  - Self administer medications as prescribed  - Attend all scheduled provider appointments  - Call provider office for new concerns, questions, or BP outside discussed parameters  - Check BP and record as discussed  - Follows a low sodium diet/DASH diet - check blood pressure daily - choose a place to take my blood pressure (home, clinic or office, retail store) - write blood pressure results in a log or diary - ask questions to understand - learn about high blood pressure - change to whole grain breads, cereal, pasta - drink 6 to 8 glasses of water each day - fill half of plate with vegetables - limit fast food meals to no more than 1 per week - manage portion size - prepare main meal at  home 3 to 5 days each week - read food labels for fat, fiber, carbohydrates and portion size - switch to low-fat or skim milk - switch to sugar-free drinks Follow Up Plan: Telephone follow up appointment with care management team member scheduled for: 08/14/21 at 3:30 PM The patient has been provided with contact information for the care management team and has been advised to call with any health related questions or concerns.       Plan:Telephone follow up appointment with care management team member scheduled for:  08/14/21 and The patient has been provided with contact information for the care management team and has been advised to call with any health related questions or concerns.  Peter Garter RN, Jackquline Denmark, CDE Care Management Coordinator [facility] [phone], Mobile [phone]

## 2021-07-10 NOTE — Patient Instructions (Signed)
Visit Information  PATIENT GOALS:  Goals Addressed             This Visit's Progress    RNCM:Eat Healthy   On track    Timeframe:  Long-Range Goal Priority:  High Start Date:     06/12/21                        Expected End Date:     12/03/21                  Follow Up Date 08/14/21    - change to whole grain breads, cereal, pasta - drink 6 to 8 glasses of water each day - fill half of plate with vegetables - limit fast food meals to no more than 1 per week - manage portion size - prepare main meal at home 3 to 5 days each week - read food labels for fat, fiber, carbohydrates and portion size - switch to low-fat or skim milk - switch to sugar-free drinks -Switch to diabetic nutritional supplements    Why is this important?   When you are ready to manage your nutrition or weight, having a plan and setting goals will help.  Taking small steps to change how you eat and exercise is a good place to start.    Notes:      RNCM:Monitor and Manage My Blood Sugar-Diabetes Type 2   Not on track    Timeframe:  Long-Range Goal Priority:  High Start Date:    06/12/21                         Expected End Date:   12/03/21                    Follow Up Date 08/14/21    - check blood sugar at prescribed times - check blood sugar if I feel it is too high or too low - enter blood sugar readings and medication or insulin into daily log - take the blood sugar log to all doctor visits - take the blood sugar meter to all doctor visits    Why is this important?   Checking your blood sugar at home helps to keep it from getting very high or very low.  Writing the results in a diary or log helps the doctor know how to care for you.  Your blood sugar log should have the time, date and the results.  Also, write down the amount of insulin or other medicine that you take.  Other information, like what you ate, exercise done and how you were feeling, will also be helpful.     Notes:      RNCM:Track  and Manage My Blood Pressure-Hypertension   On track    Timeframe:  Long-Range Goal Priority:  Medium Start Date:     06/12/21                        Expected End Date:    12/03/21                   Follow Up Date 08/14/21    - check blood pressure daily - choose a place to take my blood pressure (home, clinic or office, retail store) - write blood pressure results in a log or diary    Why is this important?   You won't feel  high blood pressure, but it can still hurt your blood vessels.  High blood pressure can cause heart or kidney problems. It can also cause a stroke.  Making lifestyle changes like losing a little weight or eating less salt will help.  Checking your blood pressure at home and at different times of the day can help to control blood pressure.  If the doctor prescribes medicine remember to take it the way the doctor ordered.  Call the office if you cannot afford the medicine or if there are questions about it.     Notes:       Diabetes Mellitus Action Plan Following a diabetes action plan is a way for you to manage your diabetes (diabetes mellitus) symptoms. The plan is color-coded to help you understand what actions you need to take based on any symptoms you are having. If you have symptoms in the red zone, you need medical care right away. If you have symptoms in the yellow zone, you are having problems. If you have symptoms in the green zone, you are doing well. Learning about and understanding diabetes can take time. Follow the plan that you develop with your health care provider. Know the target range for your blood sugar (glucose) level, and review your treatment plan with your health care provider at each visit. The target range for my blood sugar level is __________________________ mg/dL. Red zone Get medical help right away if you have any of the following symptoms: A blood sugar test result that is below 54 mg/dL (3 mmol/L). A blood sugar test result that is at  or above 240 mg/dL (13.3 mmol/L) for 2 days in a row. Confusion or trouble thinking clearly. Difficulty breathing. Sickness or a fever for 2 or more days that is not getting better. Moderate or large ketone levels in your urine. Feeling tired or having no energy. If you have any red zone symptoms, do not wait to see if the symptoms will go away. Get medical help right away. Call your local emergency services (911 in the [place] Do not drive yourself to the hospital. If you have severely low blood sugar (severe hypoglycemia) and you cannot eat or drink, you may need glucagon. Make sure a family member or close friend knows how to check your blood sugar and how to give you glucagon. You may need to be treated in a hospital for this condition. Yellow zone If you have any of the following symptoms, your diabetes is not under control and you may need to make some changes: A blood sugar test result that is at or above 240 mg/dL (13.3 mmol/L) for 2 days in a row. Blood sugar test results that are below 70 mg/dL (3.9 mmol/L). Other symptoms of hypoglycemia, such as: Shaking or feeling light-headed. Confusion or irritability. Feeling hungry. Having a fast heartbeat. If you have any yellow zone symptoms: Treat your hypoglycemia by eating or drinking 15 grams of a rapid-acting carbohydrate. Follow the 15:15 rule: Take 15 grams of a rapid-acting carbohydrate, such as: 1 tube of glucose gel. 4 glucose pills. 4 oz (120 mL) of fruit juice. 4 oz (120 mL) of regular (not diet) soda. Check your blood sugar 15 minutes after you take the carbohydrate. If the repeat blood sugar test is still at or below 70 mg/dL (3.9 mmol/L), take 15 grams of a carbohydrate again. If your blood sugar does not increase above 70 mg/dL (3.9 mmol/L) after 3 tries, get medical help right away. After your blood sugar  returns to normal, eat a meal or a snack within 1 hour. Keep taking your daily medicines as told by your health care  provider. Check your blood sugar more often than you normally would. Write down your results. Call your health care provider if you have trouble keeping your blood sugar in your target range.  Green zone These signs mean you are doing well and you can continue what you are doing to manage your diabetes: Your blood sugar is within your personal target range. For most people, a blood sugar level before a meal (preprandial) should be 80-130 mg/dL (4.4-7.2 mmol/L). You feel well, and you are able to do daily activities. If you are in the green zone, continue to manage your diabetes as told by your health care provider. To do this: Eat a healthy diet. Exercise regularly. Check your blood sugar as told by your health care provider. Take your medicines as told by your health care provider.  Where to find more information American Diabetes Association (ADA): diabetes.org Association of Diabetes Care & Education Specialists (ADCES): diabeteseducator.org Summary Following a diabetes action plan is a way for you to manage your diabetes symptoms. The plan is color-coded to help you understand what actions you need to take based on any symptoms you are having. Follow the plan that you develop with your health care provider. Make sure you know your personal target blood sugar level. Review your treatment plan with your health care provider at each visit. This information is not intended to replace advice given to you by your health care provider. Make sure you discuss any questions you have with your health care provider. Document Revised: 03/28/2020 Document Reviewed: 03/28/2020 Elsevier Patient Education  2022 [place].   Patient verbalizes understanding of instructions provided today and agrees to view in [place].   Telephone follow up appointment with care management team member scheduled for: 08/14/21 at 3:30 PM [place], [facility], CDE Care Management Coordinator [facility] [phone], Mobile [phone]

## 2021-07-10 NOTE — Telephone Encounter (Signed)
Pt calling in to remind that he hasn't received his Dexcom Kit. [facility] [address] [place], [place]

## 2021-07-10 NOTE — Patient Instructions (Signed)
For your worsening diarrhea, I am going to check stool studies to rule out infectious pathogens.  Continue on pantoprazole for your chronic reflux.  Follow-up in 6 months.  Work on alcohol cessation.  It was great talking to you again today.  Dr. Abbey Chatters

## 2021-07-10 NOTE — Telephone Encounter (Signed)
Transition Care Management Unsuccessful Follow-up Telephone Call  Date of discharge and from where:  [facility] 07/03/2021  Attempts:  3rd Attempt  Reason for unsuccessful TCM follow-up call:  Unable to reach patient

## 2021-07-28 ENCOUNTER — Telehealth: Payer: Self-pay

## 2021-07-28 NOTE — Telephone Encounter (Signed)
Documentation from the appeals dept denying the pt's Sandostatin LR. Maybe his Hematologist may be able to get it approved. I will bring the letter to you

## 2021-08-04 DIAGNOSIS — E785 Hyperlipidemia, unspecified: Secondary | ICD-10-CM | POA: Diagnosis not present

## 2021-08-04 DIAGNOSIS — E1165 Type 2 diabetes mellitus with hyperglycemia: Secondary | ICD-10-CM

## 2021-08-04 DIAGNOSIS — I10 Essential (primary) hypertension: Secondary | ICD-10-CM

## 2021-08-14 ENCOUNTER — Ambulatory Visit (INDEPENDENT_AMBULATORY_CARE_PROVIDER_SITE_OTHER): Payer: Medicare Other

## 2021-08-14 DIAGNOSIS — E1165 Type 2 diabetes mellitus with hyperglycemia: Secondary | ICD-10-CM

## 2021-08-14 DIAGNOSIS — E785 Hyperlipidemia, unspecified: Secondary | ICD-10-CM

## 2021-08-14 DIAGNOSIS — I10 Essential (primary) hypertension: Secondary | ICD-10-CM

## 2021-08-14 NOTE — Patient Instructions (Addendum)
Visit Information  Patient will self administer medications as prescribed Patient will attend all scheduled provider appointments Patient will call pharmacy for medication refills Patient will continue to perform ADL's independently Patient will continue to perform IADL's independently Patient will call provider office for new concerns or questions check blood sugar at prescribed times: three times daily check feet daily for cuts, sores or redness take the blood sugar log to all doctor visits take the blood sugar meter to all doctor visits drink 6 to 8 glasses of water each day fill half of plate with vegetables manage portion size read food labels for fat, fiber, carbohydrates and portion size switch to sugar-free drinks wash and dry feet carefully every day - check blood pressure weekly - choose a place to take my blood pressure (home, clinic or office, retail store) - keep a blood pressure log - take blood pressure log to all doctor appointments - begin an exercise program - eat more whole grains, fruits and vegetables, lean meats and healthy fats - limit salt intake to 2331m/day - call for medicine refill 2 or 3 days before it runs out - take all medications exactly as prescribed - call doctor with any symptoms you believe are related to your medicine - go to all doctor appointments as scheduled - adhere to prescribed diet:   Patient verbalizes understanding of instructions provided today and agrees to view in [facility]   Telephone follow up appointment with care management team member scheduled for:  MPeter GarterRN, BV[facility] CDE Care Management Coordinator [place]([phone] Mobile ([phone]

## 2021-08-14 NOTE — Chronic Care Management (AMB) (Signed)
Chronic Care Management   CCM RN Visit Note  08/14/2021 Name: Todd Rodriguez MRN: [id number] DOB: [birth year]  Subjective: Todd Rodriguez is a 45 y.o. year old male who is a primary care patient of Panosh, Standley Brooking, MD. The care management team was consulted for assistance with disease management and care coordination needs.    Engaged with patient by telephone for follow up visit in response to provider referral for case management and/or care coordination services.   Consent to Services:  The patient was given information about Chronic Care Management services, agreed to services, and gave verbal consent prior to initiation of services.  Please see initial visit note for detailed documentation.   Patient agreed to services and verbal consent obtained.   Assessment: Review of patient past medical history, allergies, medications, health status, including review of consultants reports, laboratory and other test data, was performed as part of comprehensive evaluation and provision of chronic care management services.   SDOH (Social Determinants of Health) assessments and interventions performed:    CCM Care Plan  No Known Allergies  Outpatient Encounter Medications as of 08/14/2021  Medication Sig   albuterol (VENTOLIN HFA) 108 (90 Base) MCG/ACT inhaler Inhale 2 puffs into the lungs every 4 (four) hours as needed for wheezing or shortness of breath.   amlodipine-benazepril (LOTREL) 2.5-10 MG capsule Take 1 capsule by mouth daily. For high blood pressure .Dose adjustment   atorvastatin (LIPITOR) 40 MG tablet Take 1 tablet (40 mg total) by mouth daily.   Brimonidine Tartrate (LUMIFY) 0.025 % SOLN Place 1 drop into both eyes daily as needed (eye irritation/dry eyes.).   citalopram (CELEXA) 20 MG tablet Take 10 mg per day for 1-2 weeks then increase to 20 mg per day or as directed   Continuous Blood Gluc Sensor (DEXCOM G6 SENSOR) MISC 1 Device by Does not apply route as directed.    Continuous Blood Gluc Transmit (DEXCOM G6 TRANSMITTER) MISC 1 Device by Does not apply route as directed.   Cyanocobalamin (VITAMIN B-12 PO) Take 1 tablet by mouth daily.   dicyclomine (BENTYL) 10 MG capsule TAKE 1 CAPSULE FOUR TIMES A DAY BEFORE MEALS AND AT BEDTIME AS NEEDED FOR ABDOMINAL CRAMPING   empagliflozin (JARDIANCE) 25 MG TABS tablet Take 1 tablet (25 mg total) by mouth daily.   folic acid (FOLVITE) 1 MG tablet Take 1 tablet (1 mg total) by mouth daily.   gabapentin (NEURONTIN) 300 MG capsule Take 1 capsule (300 mg total) by mouth 3 (three) times daily.   glucose blood (ONE TOUCH TEST STRIPS) test strip Use as instructed   insulin aspart (NOVOLOG FLEXPEN) 100 UNIT/ML FlexPen Inject 6 Units into the skin 3 (three) times daily with meals.   Insulin Pen Needle 32G X 4 MM MISC 1 Device by Does not apply route in the morning, at noon, in the evening, and at bedtime.   lipase/protease/amylase (CREON) 36000 UNITS CPEP capsule Take 1-2 capsules (36,000-72,000 Units total) by mouth See admin instructions. Take two with meals and one with snacks (total of 8 per day)   LORazepam (ATIVAN) 1 MG tablet Take 1 tablet (1 mg total) by mouth every 8 (eight) hours as needed for anxiety. Panic attacks   magnesium oxide (MAG-OX) 400 (240 Mg) MG tablet Take 1 tablet by mouth daily.   Multiple Vitamin (MULTIVITAMIN WITH MINERALS) TABS tablet Take 1 tablet by mouth daily.   ondansetron (ZOFRAN ODT) 4 MG disintegrating tablet Take 1 tablet (4 mg total) by mouth every 8 (eight)  hours as needed for nausea or vomiting.   pantoprazole (PROTONIX) 40 MG tablet Take 1 tablet (40 mg total) by mouth daily.   thiamine 100 MG tablet Take 1 tablet (100 mg total) by mouth daily.   TRESIBA FLEXTOUCH 100 UNIT/ML FlexTouch Pen Inject 18 Units into the skin daily.   No facility-administered encounter medications on file as of 08/14/2021.    Patient Active Problem List   Diagnosis Date Noted   DKA, type 2 ([place]) 07/01/2021    DKA (diabetic ketoacidosis) ([place]) 06/30/2021   Hyponatremia 06/30/2021   Hyperglycemia 06/30/2021   Hypothermia 06/30/2021   COVID-19 virus infection 06/30/2021   Hyperkalemia 06/30/2021   AKI (acute kidney injury) ([place]) 06/30/2021   Nausea & vomiting 06/30/2021   Failure to thrive in adult 06/30/2021   Starvation ketoacidosis 06/30/2021   Tobacco abuse 06/30/2021   Loose stools 08/27/2020   Abnormal weight loss 04/26/2020   Rectal bleeding 04/26/2020   Pancreatic pseudocyst    Protein-calorie malnutrition, severe 05/29/2019   High anion gap metabolic acidosis 11/57/2620   Alcohol abuse 05/28/2019   Thrombocytopenia ([place]) 05/28/2019   Pancreatic cyst 05/28/2019   Hepatic lesion 05/28/2019   Dyslipidemia 05/11/2019   Abnormal LFTs 09/12/2018   Medication management 09/12/2018   S/P cervical spinal fusion 01/12/2018   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   Carpal tunnel syndrome of left wrist 12/10/2017   Cervical radiculopathy 11/11/2017   History of fusion of cervical spine 10/22/2017   Cramps, extremity 06/25/2015   Diabetes mellitus type 2, uncontrolled, without complications 35/59/7416   Hand cramps 01/25/2015   Fever, unspecified 03/26/2014   Hx of acute pancreatitis 01/03/2014   Tobacco dependence 01/03/2014   Other and unspecified hyperlipidemia 01/03/2014   GERD (gastroesophageal reflux disease) 11/22/2013   Diabetes mellitus type 2, controlled, without complications ([place]) 38/45/3646   Essential hypertension, benign 10/25/2013   Leukocytosis, unspecified 10/25/2013    Conditions to be addressed/monitored:HTN, HLD, and DMII  Care Plan : Diabetes Type 2 (Adult)  Updates made by Dimitri Ped, RN since 08/14/2021 12:00 AM  Completed 08/14/2021   Problem: Lack of long term self management of Type 2 Diabetes Resolved 08/14/2021  Priority: High     Long-Range Goal: Effective long term self management of Type 2 Diabetes Completed  08/14/2021  Start Date: 06/12/2021  Expected End Date: 12/03/2021  Recent Progress: On track  Priority: High  Note:   Resolving due to duplicate goal  Objective:  Lab Results  Component Value Date   HGBA1C 10.9 (H) 04/30/2021   Lab Results  Component Value Date   CREATININE 0.42 04/30/2021   CREATININE 0.64 12/23/2020   CREATININE 0.59 (L) 05/10/2020   No results found for: EGFR Current Barriers:  Knowledge Deficits related to basic Diabetes pathophysiology and self care/management Knowledge Deficits related to medications used for management of diabetes Unable to independently self manage Type 2 Diabetes Does not adhere to provider recommendations re: diet Pt was admitted to hospital 06/30/21-07/03/21 for DKA and was COVID positive. States he has not missed taking his meal time insulin and the [place] since he came home from the hospital.  States he is checking his CBG twice a day States that his CBG readings have ranged from 130-200.  Denies any hypoglycemia.  States he is trying to eat less bread and more protein.  States he is drinking 2 Ensures a day to try to gain some of his weight back.  States he has not drank any alcohol  since he came home. States he had a telephone visit yesterday with his GI doctor for his loose stools.  States he is going to get labs done when he is out of quarantine next week.  States he has completed his Paxlovid    Case Manager Clinical Goal(s):  patient will demonstrate improved adherence to prescribed treatment plan for diabetes self care/management as evidenced by: daily monitoring and recording of CBG  adherence to ADA/ carb modified diet adherence to prescribed medication regimen contacting provider for new or worsened symptoms or questions Interventions:  Collaboration with Panosh, Standley Brooking, MD regarding development and update of comprehensive plan of care as evidenced by provider attestation and co-signature Inter-disciplinary care team collaboration (see  longitudinal plan of care) Reviewed education to patient about basic DM disease process Reviewed medications with patient and discussed importance of medication adherence Discussed plans with patient for ongoing care management follow up and provided patient with direct contact information for care management team Reviewed s/sx of hypoglycemia and hyperglycemia and importance of correct treatment Reviewed scheduled/upcoming provider appointments including:  Dr. Kelton Pillar 08/22/21, Dr. Regis Bill 08/25/21 Reinforced to check cbg twice a day and record, calling provider for findings outside established parameters.   Review of patient status, including review of consultants reports, relevant laboratory and other test results, and medications completed. Reviewed importance of taking his insulin as ordered and risks if he goes without insulin Reviewed to drink adequate amounts of water and non sugar sweetened beverages Reviewed to availability of treatment programs such as AA but declines at this time Reinforced to try drinking diabetic nutritional supplements to help gain weight Reviewed to avoid drinking any alcohol or sugar sweetened drinks  Self-Care Activities - Self administers oral medications as prescribed Self administers insulin as prescribed Attends all scheduled provider appointments Checks blood sugars as prescribed and utilize hyper and hypoglycemia protocol as needed Adheres to prescribed ADA/carb modified Patient Goals: - check blood sugar at prescribed times - check blood sugar if I feel it is too high or too low - enter blood sugar readings and medication or insulin into daily log - take the blood sugar log to all doctor visits - take the blood sugar meter to all doctor visits - change to whole grain breads, cereal, pasta - drink 6 to 8 glasses of water each day - fill half of plate with vegetables - limit fast food meals to no more than 1 per week - manage portion size -  prepare main meal at home 3 to 5 days each week - read food labels for fat, fiber, carbohydrates and portion size - switch to low-fat or skim milk - switch to sugar-free drinks - keep appointment with eye doctor - check feet daily for cuts, sores or redness - keep feet up while sitting - trim toenails straight across - wash and dry feet carefully every day - wear comfortable, cotton socks - wear comfortable, well-fitting shoes -Switch to diabetic nutritional supplements Follow Up Plan: Telephone follow up appointment with care management team member scheduled for: 08/14/21 at 3:30 PM The patient has been provided with contact information for the care management team and has been advised to call with any health related questions or concerns.      Care Plan : Cardiovascular disease  (HTN and HLD)  Updates made by Dimitri Ped, RN since 08/14/2021 12:00 AM  Completed 08/14/2021   Problem: Need for self management of Cardiovascular disease  (HTN and HLD) Resolved 08/14/2021  Priority: Medium  Long-Range Goal: Effective self managment of Cardiovascular disease (HTN and HLD) Completed 08/14/2021  Start Date: 06/12/2021  Expected End Date: 12/03/2021  Recent Progress: On track  Priority: Medium  Note:   Resolving due to duplicate goal  Current Barriers:  Knowledge Deficits related to basic understanding of Cardiovascular disease (HTN and HLD) pathophysiology and self care management Unable to independently self manage Cardiovascular disease (HTN and HLD) States he has been checking his B/P every day since he came home from the hospital.  States it was 120/80 today.  States he does not add salt to his food and tries to avoid foods with more salt Nurse Case Manager Clinical Goal(s):  patient will verbalize understanding of plan for Cardiovascular disease (HTN and HLD) management patient will not experience hospital admission. Hospital Admissions in last 6 months = 2 patient will  attend all scheduled medical appointments:  Dr. Kelton Pillar 08/22/21, Dr. Regis Bill 08/25/21 patient will demonstrate improved adherence to prescribed treatment plan for hypertension as evidenced by taking all medications as prescribed, monitoring and recording blood pressure as directed, adhering to low sodium/DASH diet patient will demonstrate improved health management independence as evidenced by checking blood pressure as directed and notifying PCP if SBP>160 or DBP > 90, taking all medications as prescribe, and adhering to a low sodium diet as discussed. patient will verbalize basic understanding of Cardiovascular disease (HTN and HLD) disease process and self health management plan as evidenced by readings within limits, adherence to plan of care Interventions:  Collaboration with Panosh, Standley Brooking, MD regarding development and update of comprehensive plan of care as evidenced by provider attestation and co-signature Inter-disciplinary care team collaboration (see longitudinal plan of care) Evaluation of current treatment plan related to hypertension self management and patient's adherence to plan as established by provider. Reinforced education to patient re: stroke prevention, s/s of heart attack and stroke, DASH diet, complications of uncontrolled blood pressure Reviewed medications with patient and discussed importance of compliance Discussed plans with patient for ongoing care management follow up and provided patient with direct contact information for care management team Reinforced to monitor blood pressure daily and record, calling PCP for findings outside established parameters.  Reviewed scheduled/upcoming provider appointments including: Dr. Kelton Pillar 08/22/21, Dr. Regis Bill 08/25/21 Reviewed to avoid saturated fats, trans-fats and eat more fiber Reviewed to take statin as ordered Reviewed to lower fatty foods, red meat, cheese, milk and increase fiber like whole grains and  veggies. Reviewed to try to cut back or stop smoking Self-Care Activities:  Patient verbalizes understanding of plan to self manage Cardiovascular disease (HTN and HLD)  Self administers medications as prescribed Attends all scheduled provider appointments Calls pharmacy for medication refills Calls provider office for new concerns or questions Patient Goals  - Self administer medications as prescribed  - Attend all scheduled provider appointments  - Call provider office for new concerns, questions, or BP outside discussed parameters  - Check BP and record as discussed  - Follows a low sodium diet/DASH diet - check blood pressure daily - choose a place to take my blood pressure (home, clinic or office, retail store) - write blood pressure results in a log or diary - ask questions to understand - learn about high blood pressure - change to whole grain breads, cereal, pasta - drink 6 to 8 glasses of water each day - fill half of plate with vegetables - limit fast food meals to no more than 1 per week - manage portion size - prepare main meal  at home 3 to 5 days each week - read food labels for fat, fiber, carbohydrates and portion size - switch to low-fat or skim milk - switch to sugar-free drinks Follow Up Plan: Telephone follow up appointment with care management team member scheduled for: 08/14/21 at 3:30 PM The patient has been provided with contact information for the care management team and has been advised to call with any health related questions or concerns.      Care Plan : RN Care Manager Plan of Care  Updates made by Dimitri Ped, RN since 08/14/2021 12:00 AM     Problem: Chronic Disease Management and Care Coordination Needs (DM2,HTN,HLD)   Priority: High     Long-Range Goal: Establish Plan of Care for Chronic Disease Management Needs (DM2,HTN and HLD)   Start Date: 08/14/2021  Expected End Date: 02/10/2022  This Visit's Progress: On track  Priority: High   Note:   Current Barriers:  Knowledge Deficits related to plan of care for management of HTN, HLD, and DMII Chronic Disease Management support and education needs related to HTN, HLD, and DMII Hx of alcohol abuse States he has been feeling much better.  States he is checking his CBG 3 times a day and they have been ranging in the 130-140's.  Denies any low readings.  States he is taking his insulin as ordered.  States his diarrhea is better and he is eating better now.  States he is still drinking ensure twice a day. State he is watching his portions on CHO States his B/P has been good when he checks it.  States he has not drank any alcohol since he was in the hospital.  RNCM Clinical Goal(s):  Patient will verbalize understanding of plan for management of HTN, HLD, and DMII verbalize basic understanding of  HTN, HLD, and DMII disease process and self health management plan . take all medications exactly as prescribed and will call provider for medication related questions attend all scheduled medical appointments: Dr. Regis Bill 08/25/21, Dr. Leonette Monarch 08/22/21 demonstrate Improved adherence to prescribed treatment plan for HTN, HLD, and DMII as evidenced by improved readings and adherence to plan of care continue to work with RN Care Manager to address care management and care coordination needs related to  HTN, HLD, and DMII not experience hospital admission. Hospital Admissions in last 6 months = 2  through collaboration with RN Care manager, provider, and care team.   Interventions: 1:1 collaboration with primary care provider regarding development and update of comprehensive plan of care as evidenced by provider attestation and co-signature Inter-disciplinary care team collaboration (see longitudinal plan of care) Evaluation of current treatment plan related to  self management and patient's adherence to plan as established by provider  Hypertension Interventions: Goal on track:  Yes Last  practice recorded BP readings:  BP Readings from Last 3 Encounters:  07/03/21 118/80  05/19/21 128/80  04/30/21 116/70  Most recent eGFR/CrCl: No results found for: EGFR  No components found for: CRCL  Evaluation of current treatment plan related to hypertension self management and patient's adherence to plan as established by provider; Provided education to patient re: stroke prevention, s/s of heart attack and stroke; Reviewed medications with patient and discussed importance of compliance; Counseled on adverse effects of illicit drug and excessive alcohol use in patients with high blood pressure;  Counseled on the importance of exercise goals with target of 150 minutes per week Discussed plans with patient for ongoing care management follow up and provided  patient with direct contact information for care management team; Advised patient, providing education and rationale, to monitor blood pressure daily and record, calling PCP for findings outside established parameters;  Reviewed scheduled/upcoming provider appointments including:  Discussed complications of poorly controlled blood pressure such as heart disease, stroke, circulatory complications, vision complications, kidney impairment, sexual dysfunction;  Hyperlipidemia Interventions: Goal on track:  Yes Medication review performed; medication list updated in electronic medical record.  Provider established cholesterol goals reviewed; Counseled on importance of regular laboratory monitoring as prescribed; Reviewed role and benefits of statin for ASCVD risk reduction;  Diabetes Interventions: Goal on track:  Yes Assessed patient's understanding of A1c goal: <7% Provided education to patient about basic DM disease process; Reviewed medications with patient and discussed importance of medication adherence; Counseled on importance of regular laboratory monitoring as prescribed; Discussed plans with patient for ongoing care management  follow up and provided patient with direct contact information for care management team; Reviewed scheduled/upcoming provider appointments including: Dr. Regis Bill 08/25/21, Dr. Leonette Monarch 08/22/21; Advised patient, providing education and rationale, to check cbg 3-4 times a day and record, calling provider for findings outside established parameters; Review of patient status, including review of consultants reports, relevant laboratory and other test results, and medications completed; Reviewed importance of taking insulin as ordered and reviewed how long and short acting insulin are different and why he has both Reviewed CHO portions and portion control Lab Results  Component Value Date   HGBA1C 8.3 (H) 07/02/2021   Patient Goals/Self-Care Activities: Patient will self administer medications as prescribed Patient will attend all scheduled provider appointments Patient will call pharmacy for medication refills Patient will continue to perform ADL's independently Patient will continue to perform IADL's independently Patient will call provider office for new concerns or questions check blood sugar at prescribed times: three times daily check feet daily for cuts, sores or redness take the blood sugar log to all doctor visits take the blood sugar meter to all doctor visits drink 6 to 8 glasses of water each day fill half of plate with vegetables manage portion size read food labels for fat, fiber, carbohydrates and portion size switch to sugar-free drinks wash and dry feet carefully every day - check blood pressure weekly - choose a place to take my blood pressure (home, clinic or office, retail store) - keep a blood pressure log - take blood pressure log to all doctor appointments - begin an exercise program - eat more whole grains, fruits and vegetables, lean meats and healthy fats - limit salt intake to 2323m/day - call for medicine refill 2 or 3 days before it runs out - take all  medications exactly as prescribed - call doctor with any symptoms you believe are related to your medicine - go to all doctor appointments as scheduled - adhere to prescribed diet:  Follow Up Plan:  Telephone follow up appointment with care management team member scheduled for:  09/22/21 The patient has been provided with contact information for the care management team and has been advised to call with any health related questions or concerns.            Plan:Telephone follow up appointment with care management team member scheduled for:  09/22/21 The patient has been provided with contact information for the care management team and has been advised to call with any health related questions or concerns.  MPeter GarterRN, BJackquline Denmark CDE Care Management Coordinator [facility] ([phone] Mobile ([phone]

## 2021-08-18 ENCOUNTER — Ambulatory Visit: Payer: Medicare Other | Admitting: Internal Medicine

## 2021-08-18 IMAGING — DX DG CHEST 2V
2 series · 2 of 2 positions shown · non-contrast
Comparison: 05/27/2019

CLINICAL DATA: Weight loss.  Tobacco dependence.

EXAM:
CHEST - 2 VIEW

[chest pa]
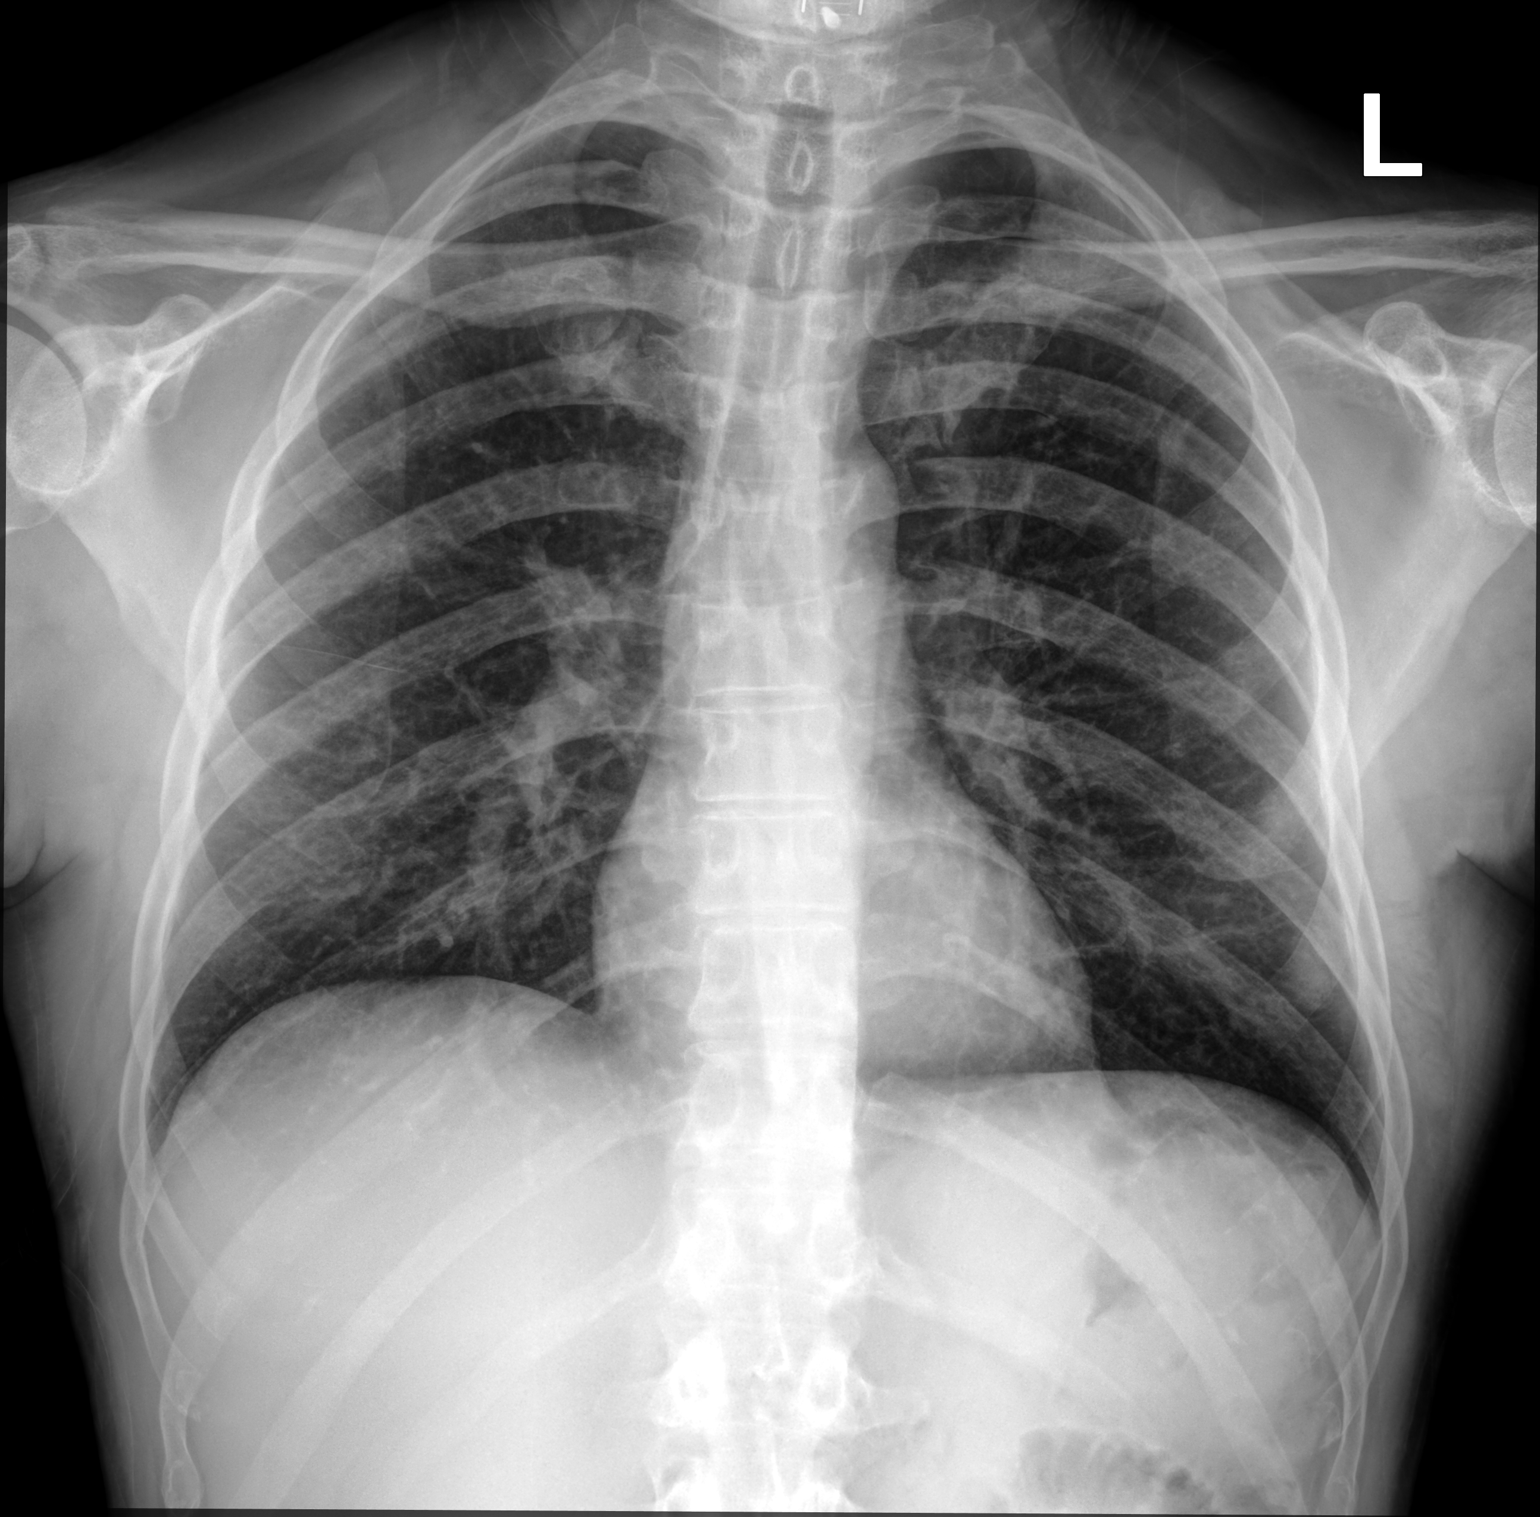

[chest lat]
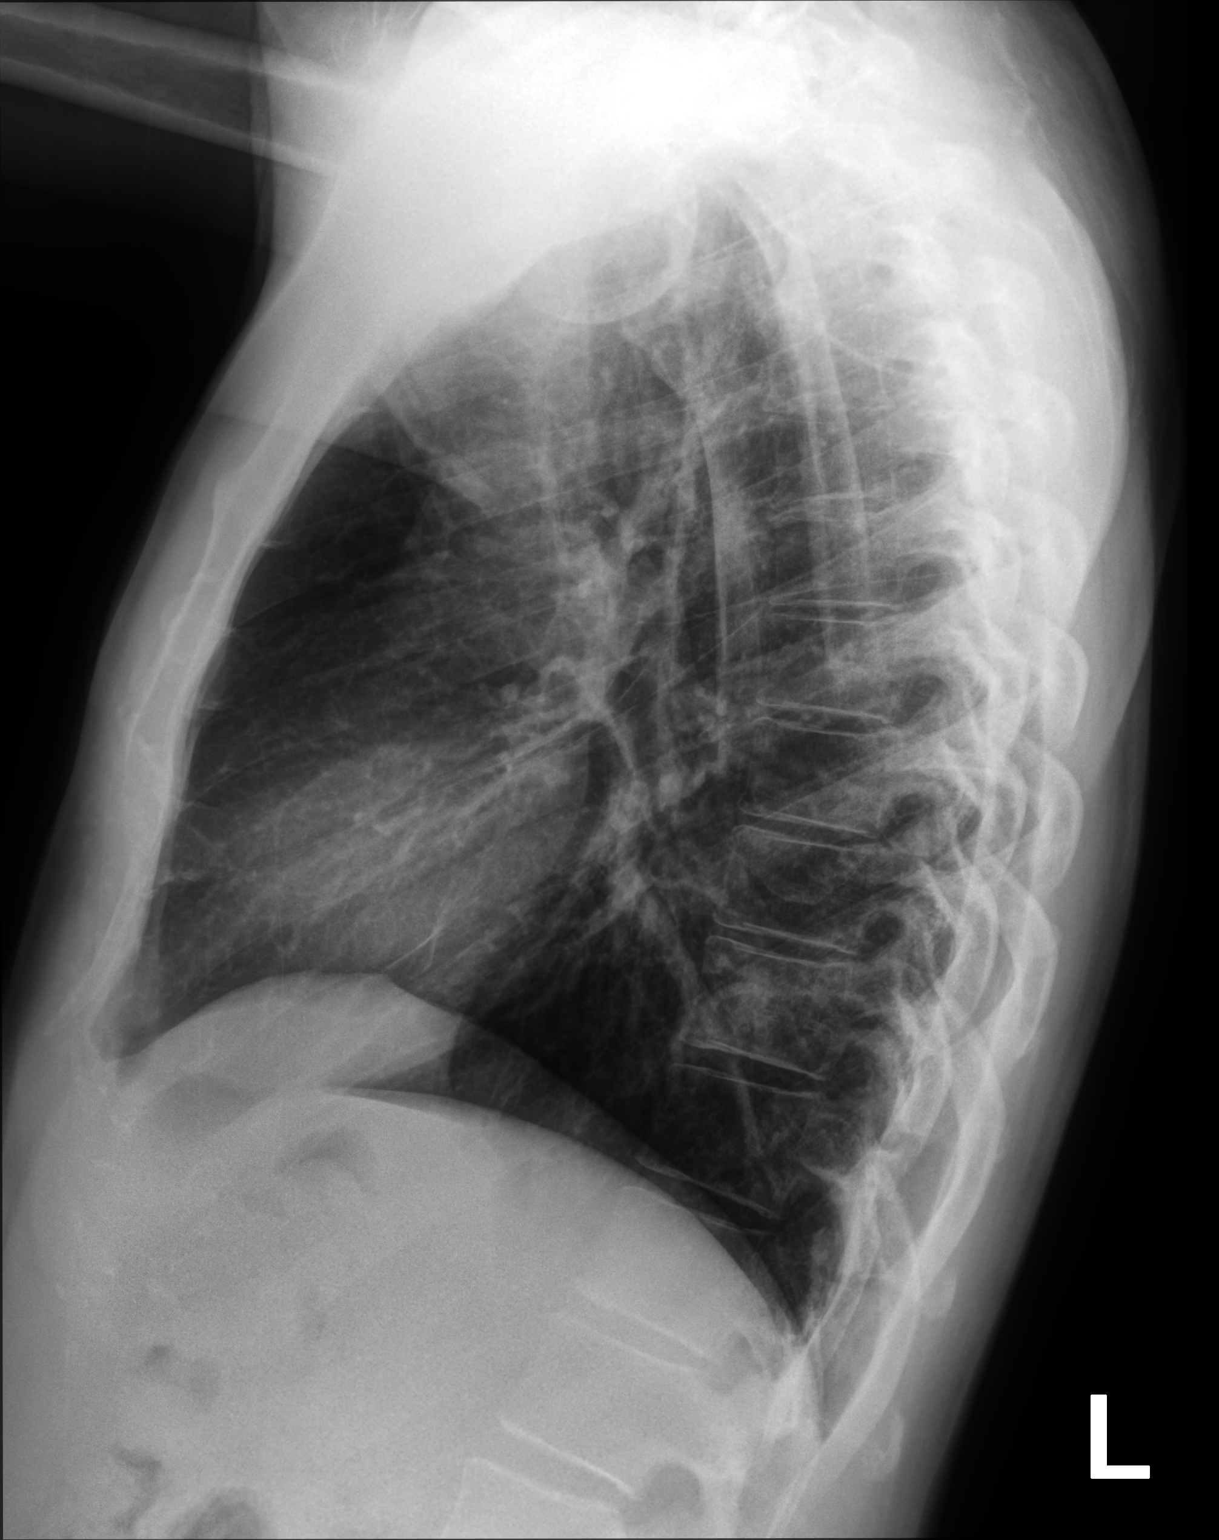

[2 of 2 positions shown; findings below may reference images not displayed]

FINDINGS: The heart size and mediastinal contours are within normal limits.
Both lungs are clear. The visualized skeletal structures are
unremarkable.
IMPRESSION: No active cardiopulmonary disease.

## 2021-08-22 ENCOUNTER — Ambulatory Visit: Payer: Medicare Other | Admitting: Internal Medicine

## 2021-08-22 NOTE — Progress Notes (Incomplete)
Name: Todd Rodriguez  Age/ Sex: 45 y.o., male   MRN/ DOB: [id number], Apr 01, 1976     PCP: Burnis Medin, MD   Reason for Endocrinology Evaluation: Type 2 Diabetes Mellitus  Initial Endocrine Consultative Visit: 05/10/2019    PATIENT IDENTIFIER: Todd Rodriguez is a 45 y.o. male with a past medical history of DM, Hx of ETOH abuse, and depression . The patient has followed with Endocrinology clinic since 05/10/2019 for consultative assistance with management of his diabetes.  DIABETIC HISTORY:  Todd Rodriguez was diagnosed with DM many years ago. His hemoglobin A1c has ranged from 7.3% in 2015, peaking at 12.2% in 2017.   On his initial visit to our clinic he had an A1c of 9.5% he was on Metformin, Jardiance, and Glimepiride, we switched glimepiride to Glipizide and continued Metformin and Jardiance, he was lost to follow up for 2 years until his return to our clinic 05/2021 . He had an A1c of 10.9 % by then and was on [place] and tresiba .      SUBJECTIVE:   During the last visit (05/19/2021): A1c 10.9% . Continued basal insulin,  Jardiance and started prandial insulin   Today (08/22/2021): Todd Rodriguez  is here for a follow up on diabetes.He checks his blood sugars 2 times daily. He did not bing his meter today. The patient has not had hypoglycemic episodes since the last clinic visit.  Has hx of pancreatitis  He drinks sugar- sweetened beverages     HOME DIABETES REGIMEN:  Jardiance 25 mg daily  Tresiba 18 units QAM  Novolog 6 units TID     Statin: yes ACE-I/ARB: yes    METER DOWNLOAD SUMMARY: Did not bring     DIABETIC COMPLICATIONS: Microvascular complications:   Denies: retinopathy, CKD  Last Eye Exam: Completed 01/2021  Macrovascular complications:   Denies: CAD, CVA, PVD   HISTORY:  Past Medical History:  Past Medical History:  Diagnosis Date   Acute pancreatitis 10/25/2013   Arthritis    Diabetes mellitus without complication (HCC)    GERD  (gastroesophageal reflux disease)    Hepatic steatosis    History of pancreatitis    Hypertension    Myalgia and myositis 04/21/2014   much better from illness presumed infectious    has disability form  will complete     Pancreatic pseudocyst    Past Surgical History:  Past Surgical History:  Procedure Laterality Date   ANTERIOR CERVICAL DECOMP/DISCECTOMY FUSION N/A 01/12/2018   Procedure: Revision ACDF C4-5, removal of hardware C5-6, exploration of fusion C5-6;  Surgeon: [name], MD;  Location: [facility];  Service: Orthopedics;  Laterality: N/A;  3.5 hrs   BIOPSY  05/13/2020   Procedure: BIOPSY;  Surgeon: Eloise Harman, DO;  [facility];  Service: Endoscopy;;   BIOPSY  09/23/2020   Procedure: BIOPSY;  Surgeon: Eloise Harman, DO;  [facility];  Service: Endoscopy;;   COLONOSCOPY WITH PROPOFOL N/A 05/13/2020   Dr. Abbey Chatters: Nonbleeding internal hemorrhoids.  Few small mouth diverticula in the sigmoid colon.  2 cecal polyps, inflammatory.  Sigmoid colon polyp hyperplastic.  Localized area of granular mucosa in the transverse colon, status post biopsy which was unremarkable.  No microscopic colitis.  Plans for repeat colonoscopy in 5 years.   ESOPHAGEAL BRUSHING  05/13/2020   Procedure: ESOPHAGEAL BRUSHING;  Surgeon: Eloise Harman, DO;  [facility];  Service: Endoscopy;;  mid esophagus   ESOPHAGOGASTRODUODENOSCOPY (EGD) WITH PROPOFOL N/A 05/13/2020  Dr. Abbey Chatters: Moderately severe esophagitis with no bleeding found in the middle third of the esophagus, suspicious for Candida esophagitis.  KOH was negative. Gastritis, duodenal polyp.  Duodenal biopsy benign.  Duodenal bulb biopsy with Brunner's gland hyperplasia and mild changes suggestive of peptic injury.  No celiac disease seen.  GE junction biopsy showed GERD with mild inflammation consisten   ESOPHAGOGASTRODUODENOSCOPY (EGD) WITH PROPOFOL N/A 09/23/2020   Procedure: ESOPHAGOGASTRODUODENOSCOPY (EGD) WITH  PROPOFOL;  Surgeon: Eloise Harman, DO;  [facility];  Service: Endoscopy;  Laterality: N/A;  7:30am   NECK SURGERY     2 disc   POLYPECTOMY  05/13/2020   Procedure: POLYPECTOMY;  Surgeon: Eloise Harman, DO;  [facility];  Service: Endoscopy;;   SPINAL FUSION     Social History:  reports that he has been smoking cigarettes. He has a 12.00 pack-year smoking history. He has never used smokeless tobacco. He reports that he does not currently use alcohol. He reports that he does not use drugs. Family History:  Family History  Problem Relation Age of Onset   Diabetes Mother    Hypertension Mother    Diabetes Father    Hypertension Father    Arthritis Maternal Grandmother    Hypertension Maternal Grandfather    Diabetes Maternal Grandfather    Colon cancer Neg Hx    Pancreatitis Neg Hx    Pancreatic cancer Neg Hx      HOME MEDICATIONS: Allergies as of 08/22/2021   No Known Allergies      Medication List        Accurate as of August 22, 2021 12:21 PM. If you have any questions, ask your nurse or doctor.          albuterol 108 (90 Base) MCG/ACT inhaler Commonly known as: VENTOLIN HFA Inhale 2 puffs into the lungs every 4 (four) hours as needed for wheezing or shortness of breath.   amlodipine-benazepril 2.5-10 MG capsule Commonly known as: LOTREL Take 1 capsule by mouth daily. For high blood pressure .Dose adjustment   atorvastatin 40 MG tablet Commonly known as: LIPITOR Take 1 tablet (40 mg total) by mouth daily.   citalopram 20 MG tablet Commonly known as: CELEXA Take 10 mg per day for 1-2 weeks then increase to 20 mg per day or as directed   Dexcom G6 Sensor Misc 1 Device by Does not apply route as directed.   Dexcom G6 Transmitter Misc 1 Device by Does not apply route as directed.   dicyclomine 10 MG capsule Commonly known as: BENTYL TAKE 1 CAPSULE FOUR TIMES A DAY BEFORE MEALS AND AT BEDTIME AS NEEDED FOR ABDOMINAL CRAMPING    empagliflozin 25 MG Tabs tablet Commonly known as: JARDIANCE Take 1 tablet (25 mg total) by mouth daily.   folic acid 1 MG tablet Commonly known as: FOLVITE Take 1 tablet (1 mg total) by mouth daily.   gabapentin 300 MG capsule Commonly known as: NEURONTIN Take 1 capsule (300 mg total) by mouth 3 (three) times daily.   glucose blood test strip Commonly known as: ONE TOUCH TEST STRIPS Use as instructed   Insulin Pen Needle 32G X 4 MM Misc 1 Device by Does not apply route in the morning, at noon, in the evening, and at bedtime.   lipase/protease/amylase 36000 UNITS Cpep capsule Commonly known as: Creon Take 1-2 capsules (36,000-72,000 Units total) by mouth See admin instructions. Take two with meals and one with snacks (total of 8 per day)  LORazepam 1 MG tablet Commonly known as: Ativan Take 1 tablet (1 mg total) by mouth every 8 (eight) hours as needed for anxiety. Panic attacks   Lumify 0.025 % Soln Generic drug: Brimonidine Tartrate Place 1 drop into both eyes daily as needed (eye irritation/dry eyes.).   magnesium oxide 400 (240 Mg) MG tablet Commonly known as: MAG-OX Take 1 tablet by mouth daily.   multivitamin with minerals Tabs tablet Take 1 tablet by mouth daily.   NovoLOG FlexPen 100 UNIT/ML FlexPen Generic drug: insulin aspart Inject 6 Units into the skin 3 (three) times daily with meals.   ondansetron 4 MG disintegrating tablet Commonly known as: Zofran ODT Take 1 tablet (4 mg total) by mouth every 8 (eight) hours as needed for nausea or vomiting.   pantoprazole 40 MG tablet Commonly known as: PROTONIX Take 1 tablet (40 mg total) by mouth daily.   thiamine 100 MG tablet Take 1 tablet (100 mg total) by mouth daily.   Tyler Aas FlexTouch 100 UNIT/ML FlexTouch Pen Generic drug: insulin degludec Inject 18 Units into the skin daily.   VITAMIN B-12 PO Take 1 tablet by mouth daily.         OBJECTIVE:   Vital Signs: There were no vitals taken for  this visit.  Wt Readings from Last 3 Encounters:  06/30/21 124 lb 9 oz (56.5 kg)  05/19/21 146 lb 9.6 oz (66.5 kg)  04/30/21 152 lb 12.8 oz (69.3 kg)     Exam: General: Pt appears well and is in NAD  Neck: General: Supple without adenopathy. Thyroid: Thyroid size normal.  No goiter or nodules appreciated.  Lungs: Clear with good BS bilat with no rales, rhonchi, or wheezes  Heart: RRR with normal S1 and S2 and no gallops; no murmurs; no rub  Abdomen: Normoactive bowel sounds, soft, nontender, without masses or organomegaly palpable  Extremities: No pretibial edema.   Neuro: MS is good with appropriate affect, pt is alert and Ox3      DATA REVIEWED:  Lab Results  Component Value Date   HGBA1C 8.3 (H) 07/02/2021   HGBA1C 10.9 (H) 04/30/2021   HGBA1C 9.6 (H) 12/23/2020   Lab Results  Component Value Date   MICROALBUR <0.7 10/18/2019   LDLCALC 74 12/23/2020   CREATININE 0.47 (L) 07/03/2021   Lab Results  Component Value Date   MICRALBCREAT 1.7 10/18/2019     Lab Results  Component Value Date   CHOL 160 12/23/2020   HDL 69.60 12/23/2020   LDLCALC 74 12/23/2020   LDLDIRECT 193.0 03/21/2019   TRIG 80.0 12/23/2020   CHOLHDL 2 12/23/2020         ASSESSMENT / PLAN / RECOMMENDATIONS:   1) Type 2 Diabetes Mellitus, Poorly controlled, With out complications - Most recent A1c of 10.9 %. Goal A1c < 7.0 %.    - Pt continues with hyperglycemia.  - We discussed adding prandial insulin  - NOT a candidate for GLP-1 agonists and DPP-4 inhibitors due to hx of pancreatitis  - Will prescribe dexcom  -Discussed pharmacokinetics of basal/bolus insulin and the importance of taking prandial insulin with meals.  We also discussed avoiding sugar-sweetened beverages and snacks, when possible.     MEDICATIONS: Change Tresiba to 18 units ONCE DAILY  Start Novolog 6 units with each meal  Continue Jardiance 25 mg , 1 tablet daily   EDUCATION / INSTRUCTIONS: BG monitoring  instructions: Patient is instructed to check his blood sugars 3 times a day, before meals . Call  [facility] if: BG persistently < 70  I reviewed the Rule of 15 for the treatment of hypoglycemia in detail with the patient. Literature supplied.   2) Diabetic complications:  Eye: Does not have known diabetic retinopathy.  Neuro/ Feet: Does not have known diabetic peripheral neuropathy .  Renal: Patient does not have known baseline CKD. He   is  on an ACEI/ARB at present.      F/U in 3 months     Signed electronically by: Mack Guise, MD  [facility]  [address] [place],  [address] Phone: [phone] FAX: [phone]   CC: Burnis Medin, [place] [address] Phone: [phone]  Fax: [phone]  Return to Endocrinology clinic as below: Future Appointments  Date Time Provider [place]  08/22/2021  3:20 PM Bobbyjoe Pabst, Melanie Crazier, MD [facility] None  08/25/2021  2:30 PM Panosh, Standley Brooking, MD [facility]  09/22/2021  3:30 PM [facility]

## 2021-08-25 ENCOUNTER — Ambulatory Visit: Payer: Medicare Other | Admitting: Internal Medicine

## 2021-09-03 DIAGNOSIS — E785 Hyperlipidemia, unspecified: Secondary | ICD-10-CM | POA: Diagnosis not present

## 2021-09-03 DIAGNOSIS — I10 Essential (primary) hypertension: Secondary | ICD-10-CM

## 2021-09-03 DIAGNOSIS — E1165 Type 2 diabetes mellitus with hyperglycemia: Secondary | ICD-10-CM

## 2021-09-03 DIAGNOSIS — Z794 Long term (current) use of insulin: Secondary | ICD-10-CM

## 2021-09-03 DIAGNOSIS — Z7984 Long term (current) use of oral hypoglycemic drugs: Secondary | ICD-10-CM

## 2021-09-22 ENCOUNTER — Ambulatory Visit (INDEPENDENT_AMBULATORY_CARE_PROVIDER_SITE_OTHER): Payer: Medicare Other

## 2021-09-22 DIAGNOSIS — E785 Hyperlipidemia, unspecified: Secondary | ICD-10-CM

## 2021-09-22 DIAGNOSIS — F1010 Alcohol abuse, uncomplicated: Secondary | ICD-10-CM

## 2021-09-22 DIAGNOSIS — I10 Essential (primary) hypertension: Secondary | ICD-10-CM

## 2021-09-22 DIAGNOSIS — E1165 Type 2 diabetes mellitus with hyperglycemia: Secondary | ICD-10-CM

## 2021-09-22 NOTE — Patient Instructions (Signed)
Visit Information  Thank you for taking time to visit with me today. Please don't hesitate to contact me if I can be of assistance to you before our next scheduled telephone appointment.  Following are the goals we discussed today:  Take all medications as prescribed Attend all scheduled provider appointments Call pharmacy for medication refills 3-7 days in advance of running out of medications Call provider office for new concerns or questions  keep appointment with eye doctor check blood sugar at prescribed times: 4 times daily and when you have symptoms of low or high blood sugar check feet daily for cuts, sores or redness take the blood sugar log to all doctor visits drink 6 to 8 glasses of water each day fill half of plate with vegetables manage portion size prepare main meal at home 3 to 5 days each week Avoid drinking alcohol check blood pressure 3 times per week choose a place to take my blood pressure (home, clinic or office, retail store) take blood pressure log to all doctor appointments begin an exercise program eat more whole grains, fruits and vegetables, lean meats and healthy fats limit salt intake to 2320m/day call for medicine refill 2 or 3 days before it runs out take all medications exactly as prescribed call doctor with any symptoms you believe are related to your medicine  Our next appointment is by telephone on 10/24/21 at 2:15 PM  Please call the care guide team at [phone] you need to cancel or reschedule your appointment.   If you are experiencing a Mental Health or [place] need someone to talk to, please call the Suicide and Crisis Lifeline: 988 call the [place] Suicide Prevention Lifeline: 1306 248 5190or TTY: 1640-763-5974TTY (956-288-4444 to talk to a trained counselor call 1-800-273-TALK (toll free, 24 hour hotline) call 911   Patient verbalizes understanding of instructions provided today and agrees to view in  MAnchor  MPeter GarterRN, BJackquline Denmark CDE Care Management Coordinator [facility] ([phone] Mobile [phone]

## 2021-09-22 NOTE — Chronic Care Management (AMB) (Signed)
Chronic Care Management   CCM RN Visit Note  09/22/2021 Name: Todd Rodriguez MRN: [id number] DOB: 04-12-1976  Subjective: Todd Rodriguez is a 45 y.o. year old male who is a primary care patient of Panosh, Standley Brooking, MD. The care management team was consulted for assistance with disease management and care coordination needs.    Engaged with patient by telephone for follow up visit in response to provider referral for case management and/or care coordination services.   Consent to Services:  The patient was given information about Chronic Care Management services, agreed to services, and gave verbal consent prior to initiation of services.  Please see initial visit note for detailed documentation.   Patient agreed to services and verbal consent obtained.   Assessment: Review of patient past medical history, allergies, medications, health status, including review of consultants reports, laboratory and other test data, was performed as part of comprehensive evaluation and provision of chronic care management services.   SDOH (Social Determinants of Health) assessments and interventions performed:    CCM Care Plan  No Known Allergies  Outpatient Encounter Medications as of 09/22/2021  Medication Sig   albuterol (VENTOLIN HFA) 108 (90 Base) MCG/ACT inhaler Inhale 2 puffs into the lungs every 4 (four) hours as needed for wheezing or shortness of breath.   amlodipine-benazepril (LOTREL) 2.5-10 MG capsule Take 1 capsule by mouth daily. For high blood pressure .Dose adjustment   atorvastatin (LIPITOR) 40 MG tablet Take 1 tablet (40 mg total) by mouth daily.   Brimonidine Tartrate (LUMIFY) 0.025 % SOLN Place 1 drop into both eyes daily as needed (eye irritation/dry eyes.).   citalopram (CELEXA) 20 MG tablet Take 10 mg per day for 1-2 weeks then increase to 20 mg per day or as directed   Continuous Blood Gluc Sensor (DEXCOM G6 SENSOR) MISC 1 Device by Does not apply route as directed.    Continuous Blood Gluc Transmit (DEXCOM G6 TRANSMITTER) MISC 1 Device by Does not apply route as directed.   Cyanocobalamin (VITAMIN B-12 PO) Take 1 tablet by mouth daily.   dicyclomine (BENTYL) 10 MG capsule TAKE 1 CAPSULE FOUR TIMES A DAY BEFORE MEALS AND AT BEDTIME AS NEEDED FOR ABDOMINAL CRAMPING   empagliflozin (JARDIANCE) 25 MG TABS tablet Take 1 tablet (25 mg total) by mouth daily.   folic acid (FOLVITE) 1 MG tablet Take 1 tablet (1 mg total) by mouth daily.   gabapentin (NEURONTIN) 300 MG capsule Take 1 capsule (300 mg total) by mouth 3 (three) times daily.   glucose blood (ONE TOUCH TEST STRIPS) test strip Use as instructed   insulin aspart (NOVOLOG FLEXPEN) 100 UNIT/ML FlexPen Inject 6 Units into the skin 3 (three) times daily with meals.   Insulin Pen Needle 32G X 4 MM MISC 1 Device by Does not apply route in the morning, at noon, in the evening, and at bedtime.   lipase/protease/amylase (CREON) 36000 UNITS CPEP capsule Take 1-2 capsules (36,000-72,000 Units total) by mouth See admin instructions. Take two with meals and one with snacks (total of 8 per day)   LORazepam (ATIVAN) 1 MG tablet Take 1 tablet (1 mg total) by mouth every 8 (eight) hours as needed for anxiety. Panic attacks   magnesium oxide (MAG-OX) 400 (240 Mg) MG tablet Take 1 tablet by mouth daily.   Multiple Vitamin (MULTIVITAMIN WITH MINERALS) TABS tablet Take 1 tablet by mouth daily.   ondansetron (ZOFRAN ODT) 4 MG disintegrating tablet Take 1 tablet (4 mg total) by mouth every 8 (eight)  hours as needed for nausea or vomiting.   pantoprazole (PROTONIX) 40 MG tablet Take 1 tablet (40 mg total) by mouth daily.   thiamine 100 MG tablet Take 1 tablet (100 mg total) by mouth daily.   TRESIBA FLEXTOUCH 100 UNIT/ML FlexTouch Pen Inject 18 Units into the skin daily.   No facility-administered encounter medications on file as of 09/22/2021.    Patient Active Problem List   Diagnosis Date Noted   DKA, type 2 ([place]) 07/01/2021    DKA (diabetic ketoacidosis) ([place]) 06/30/2021   Hyponatremia 06/30/2021   Hyperglycemia 06/30/2021   Hypothermia 06/30/2021   COVID-19 virus infection 06/30/2021   Hyperkalemia 06/30/2021   AKI (acute kidney injury) ([place]) 06/30/2021   Nausea & vomiting 06/30/2021   Failure to thrive in adult 06/30/2021   Starvation ketoacidosis 06/30/2021   Tobacco abuse 06/30/2021   Loose stools 08/27/2020   Abnormal weight loss 04/26/2020   Rectal bleeding 04/26/2020   Pancreatic pseudocyst    Protein-calorie malnutrition, severe 05/29/2019   High anion gap metabolic acidosis 59/16/3846   Alcohol abuse 05/28/2019   Thrombocytopenia ([place]) 05/28/2019   Pancreatic cyst 05/28/2019   Hepatic lesion 05/28/2019   Dyslipidemia 05/11/2019   Abnormal LFTs 09/12/2018   Medication management 09/12/2018   S/P cervical spinal fusion 01/12/2018   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/03/2018   Carpal tunnel syndrome of left wrist 12/10/2017   Cervical radiculopathy 11/11/2017   History of fusion of cervical spine 10/22/2017   Cramps, extremity 06/25/2015   Diabetes mellitus type 2, uncontrolled, without complications 65/99/3570   Hand cramps 01/25/2015   Fever, unspecified 03/26/2014   Hx of acute pancreatitis 01/03/2014   Tobacco dependence 01/03/2014   Other and unspecified hyperlipidemia 01/03/2014   GERD (gastroesophageal reflux disease) 11/22/2013   Diabetes mellitus type 2, controlled, without complications ([place]) 17/79/3903   Essential hypertension, benign 10/25/2013   Leukocytosis, unspecified 10/25/2013    Conditions to be addressed/monitored:HTN, HLD, DMII, and hx alcohol abuse  Care Plan : RN Care Manager Plan of Care  Updates made by Dimitri Ped, RN since 09/22/2021 12:00 AM     Problem: Chronic Disease Management and Care Coordination Needs (DM2,HTN,HLD)   Priority: High     Long-Range Goal: Establish Plan of Care for Chronic Disease Management Needs (DM2,HTN and  HLD)   Start Date: 08/14/2021  Expected End Date: 02/10/2022  Recent Progress: On track  Priority: High  Note:   Current Barriers:  Knowledge Deficits related to plan of care for management of HTN, HLD, DMII, and hx alcohol abuse  Chronic Disease Management support and education needs related to HTN, HLD, DMII, and hx alcohol abuse   Hx of alcohol abuse States he was in the hospital at [facility] for about a week during Thanksgiving.  States his sugars were high and he also had confusion when he was there.  Sates he does not know why his sugars went up as he was taking his insulin.   States he is checking his CBG 3 times a day and they have been ranging in the 150-190's now.  Denies any low readings.  States he is taking his insulin as ordered.  States his diarrhea is better and he is eating better now.  States he is still drinking ensure twice a day. State he is watching his portions on CHO States his B/P has been good when he checks it.  States he has not drank any alcohol since about a week before he was  in the hospital during Thanksgiving.  States he had to miss his doctors appointment because he was sick.  States he is now trying to be more active and get more steps  RNCM Clinical Goal(s):  Patient will verbalize understanding of plan for management of HTN, HLD, DMII, and hx alcohol abuse  as evidenced by voiced adherence to plan of care verbalize basic understanding of  HTN, HLD, DMII, and hx alcohol abuse  disease process and self health management plan as evidenced by . take all medications exactly as prescribed and will call provider for medication related questions as evidenced by dispense report and pt verbalization attend all scheduled medical appointments: no scheduled appointments as evidenced by no scheduled appointments demonstrate Improved adherence to prescribed treatment plan for HTN, HLD, DMII, and hx alcohol abuse  as evidenced by improved readings and adherence to plan of  care continue to work with RN Care Manager to address care management and care coordination needs related to  HTN, HLD, DMII, and hx alcohol abuse  as evidenced by adherence to CM Team Scheduled appointments not experience hospital admission as evidenced by review of EMR. Hospital Admissions in last 6 months = 3  through collaboration with RN Care manager, provider, and care team.   Interventions: 1:1 collaboration with primary care provider regarding development and update of comprehensive plan of care as evidenced by provider attestation and co-signature Inter-disciplinary care team collaboration (see longitudinal plan of care) Evaluation of current treatment plan related to  self management and patient's adherence to plan as established by provider  Hypertension Interventions: (Status:  Goal on track:  Yes.)  Long Term Goal Last practice recorded BP readings:  BP Readings from Last 3 Encounters:  07/03/21 118/80  05/19/21 128/80  04/30/21 116/70  Most recent eGFR/CrCl: No results found for: EGFR  No components found for: CRCL  Evaluation of current treatment plan related to hypertension self management and patient's adherence to plan as established by provider; Provided education to patient re: stroke prevention, s/s of heart attack and stroke; Reviewed medications with patient and discussed importance of compliance; Counseled on adverse effects of illicit drug and excessive alcohol use in patients with high blood pressure;  Counseled on the importance of exercise goals with target of 150 minutes per week Discussed plans with patient for ongoing care management follow up and provided patient with direct contact information for care management team; Advised patient, providing education and rationale, to monitor blood pressure daily and record, calling PCP for findings outside established parameters;  Reviewed scheduled/upcoming provider appointments including:  Discussed complications of  poorly controlled blood pressure such as heart disease, stroke, circulatory complications, vision complications, kidney impairment, sexual dysfunction;  Hyperlipidemia Interventions: (Status:  Goal on track:  Yes.)  Long Term Goal Medication review performed; medication list updated in electronic medical record.  Provider established cholesterol goals reviewed; Counseled on importance of regular laboratory monitoring as prescribed; Reviewed role and benefits of statin for ASCVD risk reduction;  Diabetes Interventions: (Status:  Goal on track:  NO.)  Long Term Goal  Assessed patient's understanding of A1c goal: <7% Provided education to patient about basic DM disease process Reviewed medications with patient and discussed importance of medication adherence Counseled on importance of regular laboratory monitoring as prescribed Discussed plans with patient for ongoing care management follow up and provided patient with direct contact information for care management team Reviewed scheduled/upcoming provider appointments including: no upcoming appointments Advised patient, providing education and rationale, to check cbg 3-4 times  a day and record, calling provider for findings outside established parameters Review of patient status, including review of consultants reports, relevant laboratory and other test results, and medications completed Reinforced importance of taking insulin as ordered and reviewed how long and short acting insulin are different and why he has both . Reviewed blood sugar goals and actions to take for elevated blood sugars Reinforced CHO portions and portion control Reviewed to call and reschedule his missed appointments with provider and endocrinologist Lab Results  Component Value Date   HGBA1C 8.3 (H) 07/02/2021  Hx Alcohol abuse  (Status:  New goal.)  Long Term Goal Evaluation of current treatment plan related to  Hx Alcohol abuse , Substance abuse issues -  alcohol   self-management and patient's adherence to plan as established by provider. Discussed plans with patient for ongoing care management follow up and provided patient with direct contact information for care management team Evaluation of current treatment plan related to Hx Alcohol abuse and patient's adherence to plan as established by provider Provided education to patient re: Hx Alcohol abuse Social Work referral for declines referral to LCSW for alcohol abuse Discussed plans with patient for ongoing care management follow up and provided patient with direct contact information for care management team Discussed looking into AA programs but declines at this time.  Reviewed how alcohol abuse effects his diabetes control    Patient Goals/Self-Care Activities: Take all medications as prescribed Attend all scheduled provider appointments Call pharmacy for medication refills 3-7 days in advance of running out of medications Call provider office for new concerns or questions  keep appointment with eye doctor check blood sugar at prescribed times: 4 times daily and when you have symptoms of low or high blood sugar check feet daily for cuts, sores or redness take the blood sugar log to all doctor visits drink 6 to 8 glasses of water each day fill half of plate with vegetables manage portion size prepare main meal at home 3 to 5 days each week Avoid drinking alcohol check blood pressure 3 times per week choose a place to take my blood pressure (home, clinic or office, retail store) take blood pressure log to all doctor appointments begin an exercise program eat more whole grains, fruits and vegetables, lean meats and healthy fats limit salt intake to 2323m/day call for medicine refill 2 or 3 days before it runs out take all medications exactly as prescribed call doctor with any symptoms you believe are related to your medicine Follow Up Plan:  Telephone follow up appointment with care  management team member scheduled for:  10/24/21 The patient has been provided with contact information for the care management team and has been advised to call with any health related questions or concerns.            Plan:Telephone follow up appointment with care management team member scheduled for:  10/24/21 The patient has been provided with contact information for the care management team and has been advised to call with any health related questions or concerns.  MPeter GarterRN, BJackquline Denmark CDE Care Management Coordinator [facility] ([phone] Mobile ([phone]

## 2021-10-04 DIAGNOSIS — I10 Essential (primary) hypertension: Secondary | ICD-10-CM

## 2021-10-04 DIAGNOSIS — E785 Hyperlipidemia, unspecified: Secondary | ICD-10-CM | POA: Diagnosis not present

## 2021-10-04 DIAGNOSIS — E1165 Type 2 diabetes mellitus with hyperglycemia: Secondary | ICD-10-CM

## 2021-10-14 ENCOUNTER — Ambulatory Visit: Payer: Medicare Other | Admitting: Internal Medicine

## 2021-10-14 NOTE — Progress Notes (Deleted)
No chief complaint on file.   HPI: Todd Rodriguez 46 y.o. come in for Chronic disease management  ROS: See pertinent positives and negatives per HPI.  Past Medical History:  Diagnosis Date   Acute pancreatitis 10/25/2013   Arthritis    Diabetes mellitus without complication (HCC)    GERD (gastroesophageal reflux disease)    Hepatic steatosis    History of pancreatitis    Hypertension    Myalgia and myositis 04/21/2014   much better from illness presumed infectious    has disability form  will complete     Pancreatic pseudocyst     Family History  Problem Relation Age of Onset   Diabetes Mother    Hypertension Mother    Diabetes Father    Hypertension Father    Arthritis Maternal Grandmother    Hypertension Maternal Grandfather    Diabetes Maternal Grandfather    Colon cancer Neg Hx    Pancreatitis Neg Hx    Pancreatic cancer Neg Hx     Social History   Socioeconomic History   Marital status: Married    Spouse name: Not on file   Number of children: 1   Years of education: Not on file   Highest education level: Not on file  Occupational History    Employer: [employer]  Tobacco Use   Smoking status: Every Day    Packs/day: 1.00    Years: 12.00    Pack years: 12.00    Types: Cigarettes   Smokeless tobacco: Never  Vaping Use   Vaping Use: Never used  Substance and Sexual Activity   Alcohol use: Not Currently    Comment: every weekend, one drink. 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             Social Determinants of Health   Financial Resource  Strain: Low Risk    Difficulty of Paying Living Expenses: Not hard at all  Food Insecurity: No Food Insecurity   Worried About Charity fundraiser in the Last Year: Never true   [place] in the Last Year: Never true  Transportation Needs: No Transportation Needs   Lack of Transportation (Medical): No   Lack of Transportation (Non-Medical): No  Physical Activity: Insufficiently Active   Days of Exercise per Week: 3 days   Minutes of Exercise per Session: 30 min  Stress: No Stress Concern Present   Feeling of Stress : Not at all  Social Connections: Socially Integrated   Frequency of Communication with Friends and Family: Three times a week   Frequency of Social Gatherings with Friends and Family: Three times a week   Attends Religious Services: More than 4 times per year   Active Member of Clubs or Organizations: Yes   Attends Archivist Meetings: More than 4 times per year   Marital Status: Married    Outpatient Medications Prior to Visit  Medication Sig Dispense Refill  albuterol (VENTOLIN HFA) 108 (90 Base) MCG/ACT inhaler Inhale 2 puffs into the lungs every 4 (four) hours as needed for wheezing or shortness of breath. 18 g 0   amlodipine-benazepril (LOTREL) 2.5-10 MG capsule Take 1 capsule by mouth daily. For high blood pressure .Dose adjustment 90 capsule 1   atorvastatin (LIPITOR) 40 MG tablet Take 1 tablet (40 mg total) by mouth daily. 90 tablet 1   Brimonidine Tartrate (LUMIFY) 0.025 % SOLN Place 1 drop into both eyes daily as needed (eye irritation/dry eyes.).     citalopram (CELEXA) 20 MG tablet Take 10 mg per day for 1-2 weeks then increase to 20 mg per day or as directed 90 tablet 1   Continuous Blood Gluc Sensor (DEXCOM G6 SENSOR) MISC 1 Device by Does not apply route as directed. 9 each 3   Continuous Blood Gluc Transmit (DEXCOM G6 TRANSMITTER) MISC 1 Device by Does not apply route as directed. 1 each 3   Cyanocobalamin (VITAMIN B-12 PO) Take 1 tablet by  mouth daily.     dicyclomine (BENTYL) 10 MG capsule TAKE 1 CAPSULE FOUR TIMES A DAY BEFORE MEALS AND AT BEDTIME AS NEEDED FOR ABDOMINAL CRAMPING 360 capsule 0   empagliflozin (JARDIANCE) 25 MG TABS tablet Take 1 tablet (25 mg total) by mouth daily. 90 tablet 3   folic acid (FOLVITE) 1 MG tablet Take 1 tablet (1 mg total) by mouth daily. 90 tablet 3   gabapentin (NEURONTIN) 300 MG capsule Take 1 capsule (300 mg total) by mouth 3 (three) times daily. 90 capsule 3   glucose blood (ONE TOUCH TEST STRIPS) test strip Use as instructed 100 each 11   insulin aspart (NOVOLOG FLEXPEN) 100 UNIT/ML FlexPen Inject 6 Units into the skin 3 (three) times daily with meals. 15 mL 6   Insulin Pen Needle 32G X 4 MM MISC 1 Device by Does not apply route in the morning, at noon, in the evening, and at bedtime. 400 each 3   lipase/protease/amylase (CREON) 36000 UNITS CPEP capsule Take 1-2 capsules (36,000-72,000 Units total) by mouth See admin instructions. Take two with meals and one with snacks (total of 8 per day) 480 capsule 5   LORazepam (ATIVAN) 1 MG tablet Take 1 tablet (1 mg total) by mouth every 8 (eight) hours as needed for anxiety. Panic attacks 20 tablet 0   magnesium oxide (MAG-OX) 400 (240 Mg) MG tablet Take 1 tablet by mouth daily.     Multiple Vitamin (MULTIVITAMIN WITH MINERALS) TABS tablet Take 1 tablet by mouth daily. 120 tablet 1   ondansetron (ZOFRAN ODT) 4 MG disintegrating tablet Take 1 tablet (4 mg total) by mouth every 8 (eight) hours as needed for nausea or vomiting. 20 tablet 0   pantoprazole (PROTONIX) 40 MG tablet Take 1 tablet (40 mg total) by mouth daily. 90 tablet 1   thiamine 100 MG tablet Take 1 tablet (100 mg total) by mouth daily. 30 tablet 3   TRESIBA FLEXTOUCH 100 UNIT/ML FlexTouch Pen Inject 18 Units into the skin daily. 15 mL 6   No facility-administered medications prior to visit.     EXAM:  There were no vitals taken for this visit.  There is no height or weight on file to  calculate BMI.  GENERAL: vitals reviewed and listed above, alert, oriented, appears well hydrated and in no acute distress HEENT: atraumatic, conjunctiva  clear, no obvious abnormalities on inspection of external nose and ears OP : no lesion edema or exudate  NECK: no  obvious masses on inspection palpation  LUNGS: clear to auscultation bilaterally, no wheezes, rales or rhonchi, good air movement CV: HRRR, no clubbing cyanosis or  peripheral edema nl cap refill  MS: moves all extremities without noticeable focal  abnormality PSYCH: pleasant and cooperative, no obvious depression or anxiety Lab Results  Component Value Date   WBC 6.7 07/01/2021   HGB 14.3 07/01/2021   HCT 43.2 07/01/2021   PLT 49 (L) 07/01/2021   GLUCOSE 198 (H) 07/03/2021   CHOL 160 12/23/2020   TRIG 80.0 12/23/2020   HDL 69.60 12/23/2020   LDLDIRECT 193.0 03/21/2019   LDLCALC 74 12/23/2020   ALT 27 07/03/2021   AST 49 (H) 07/03/2021   NA 132 (L) 07/03/2021   K 3.1 (L) 07/03/2021   CL 95 (L) 07/03/2021   CREATININE 0.47 (L) 07/03/2021   BUN 6 07/03/2021   CO2 29 07/03/2021   TSH 1.68 04/30/2021   PSA 1.17 12/23/2020   INR 1.0 07/01/2021   HGBA1C 8.3 (H) 07/02/2021   MICROALBUR <0.7 10/18/2019   BP Readings from Last 3 Encounters:  07/03/21 118/80  05/19/21 128/80  04/30/21 116/70    ASSESSMENT AND PLAN:  Discussed the following assessment and plan:  No diagnosis found.  -Patient advised to return or notify health care team  if  new concerns arise.  There are no Patient Instructions on file for this visit.   Standley Brooking. Constance Hackenberg M.D.

## 2021-10-24 ENCOUNTER — Telehealth: Payer: Medicare Other

## 2021-10-24 ENCOUNTER — Telehealth: Payer: Self-pay

## 2021-10-24 NOTE — Telephone Encounter (Signed)
°  Care Management   Follow Up Note   10/24/2021 Name: Todd Rodriguez MRN: [id number] DOB: 1975/11/07   Referred by: Burnis Medin, MD Reason for referral : Chronic Care Management (RNCM: Follow up Outreach Chronic Care Management and coordination needs-unsuccessful outreach)   An unsuccessful telephone outreach was attempted today. The patient was referred to the case management team for assistance with care management and care coordination.   Follow Up Plan: The care management team will reach out to the patient again over the next 30 days.   Peter Garter RN, Jackquline Denmark, CDE Care Management Coordinator [facility] [phone], Mobile [phone]

## 2021-10-31 NOTE — Telephone Encounter (Signed)
Rescheduled 11/04/21  [facility]  Direct Dial: [phone]

## 2021-11-04 ENCOUNTER — Ambulatory Visit (INDEPENDENT_AMBULATORY_CARE_PROVIDER_SITE_OTHER): Payer: Medicare Other

## 2021-11-04 DIAGNOSIS — E1165 Type 2 diabetes mellitus with hyperglycemia: Secondary | ICD-10-CM | POA: Diagnosis not present

## 2021-11-04 DIAGNOSIS — F1010 Alcohol abuse, uncomplicated: Secondary | ICD-10-CM

## 2021-11-04 DIAGNOSIS — E785 Hyperlipidemia, unspecified: Secondary | ICD-10-CM

## 2021-11-04 DIAGNOSIS — I10 Essential (primary) hypertension: Secondary | ICD-10-CM | POA: Diagnosis not present

## 2021-11-04 NOTE — Chronic Care Management (AMB) (Signed)
Chronic Care Management   CCM RN Visit Note  11/04/2021 Name: Todd Rodriguez MRN: [id number] DOB: 1976/03/10  Subjective: Todd Rodriguez is a 46 y.o. year old male who is a primary care patient of Panosh, Standley Brooking, MD. The care management team was consulted for assistance with disease management and care coordination needs.    Engaged with patient by telephone for follow up visit in response to provider referral for case management and/or care coordination services.   Consent to Services:  The patient was given information about Chronic Care Management services, agreed to services, and gave verbal consent prior to initiation of services.  Please see initial visit note for detailed documentation.   Patient agreed to services and verbal consent obtained.   Assessment: Review of patient past medical history, allergies, medications, health status, including review of consultants reports, laboratory and other test data, was performed as part of comprehensive evaluation and provision of chronic care management services.   SDOH (Social Determinants of Health) assessments and interventions performed:    CCM Care Plan  No Known Allergies  Outpatient Encounter Medications as of 11/04/2021  Medication Sig   albuterol (VENTOLIN HFA) 108 (90 Base) MCG/ACT inhaler Inhale 2 puffs into the lungs every 4 (four) hours as needed for wheezing or shortness of breath.   amlodipine-benazepril (LOTREL) 2.5-10 MG capsule Take 1 capsule by mouth daily. For high blood pressure .Dose adjustment   atorvastatin (LIPITOR) 40 MG tablet Take 1 tablet (40 mg total) by mouth daily.   Brimonidine Tartrate (LUMIFY) 0.025 % SOLN Place 1 drop into both eyes daily as needed (eye irritation/dry eyes.).   citalopram (CELEXA) 20 MG tablet Take 10 mg per day for 1-2 weeks then increase to 20 mg per day or as directed   Continuous Blood Gluc Sensor (DEXCOM G6 SENSOR) MISC 1 Device by Does not apply route as directed.   Continuous  Blood Gluc Transmit (DEXCOM G6 TRANSMITTER) MISC 1 Device by Does not apply route as directed.   Cyanocobalamin (VITAMIN B-12 PO) Take 1 tablet by mouth daily.   dicyclomine (BENTYL) 10 MG capsule TAKE 1 CAPSULE FOUR TIMES A DAY BEFORE MEALS AND AT BEDTIME AS NEEDED FOR ABDOMINAL CRAMPING   empagliflozin (JARDIANCE) 25 MG TABS tablet Take 1 tablet (25 mg total) by mouth daily.   folic acid (FOLVITE) 1 MG tablet Take 1 tablet (1 mg total) by mouth daily.   gabapentin (NEURONTIN) 300 MG capsule Take 1 capsule (300 mg total) by mouth 3 (three) times daily.   glucose blood (ONE TOUCH TEST STRIPS) test strip Use as instructed   insulin aspart (NOVOLOG FLEXPEN) 100 UNIT/ML FlexPen Inject 6 Units into the skin 3 (three) times daily with meals.   Insulin Pen Needle 32G X 4 MM MISC 1 Device by Does not apply route in the morning, at noon, in the evening, and at bedtime.   lipase/protease/amylase (CREON) 36000 UNITS CPEP capsule Take 1-2 capsules (36,000-72,000 Units total) by mouth See admin instructions. Take two with meals and one with snacks (total of 8 per day)   LORazepam (ATIVAN) 1 MG tablet Take 1 tablet (1 mg total) by mouth every 8 (eight) hours as needed for anxiety. Panic attacks   magnesium oxide (MAG-OX) 400 (240 Mg) MG tablet Take 1 tablet by mouth daily.   Multiple Vitamin (MULTIVITAMIN WITH MINERALS) TABS tablet Take 1 tablet by mouth daily.   ondansetron (ZOFRAN ODT) 4 MG disintegrating tablet Take 1 tablet (4 mg total) by mouth every 8 (eight)  hours as needed for nausea or vomiting.   pantoprazole (PROTONIX) 40 MG tablet Take 1 tablet (40 mg total) by mouth daily.   thiamine 100 MG tablet Take 1 tablet (100 mg total) by mouth daily.   TRESIBA FLEXTOUCH 100 UNIT/ML FlexTouch Pen Inject 18 Units into the skin daily.   No facility-administered encounter medications on file as of 11/04/2021.    Patient Active Problem List   Diagnosis Date Noted   DKA, type 2 ([place]) 07/01/2021   DKA  (diabetic ketoacidosis) ([place]) 06/30/2021   Hyponatremia 06/30/2021   Hyperglycemia 06/30/2021   Hypothermia 06/30/2021   COVID-19 virus infection 06/30/2021   Hyperkalemia 06/30/2021   AKI (acute kidney injury) ([place]) 06/30/2021   Nausea & vomiting 06/30/2021   Failure to thrive in adult 06/30/2021   Starvation ketoacidosis 06/30/2021   Tobacco abuse 06/30/2021   Loose stools 08/27/2020   Abnormal weight loss 04/26/2020   Rectal bleeding 04/26/2020   Pancreatic pseudocyst    Protein-calorie malnutrition, severe 05/29/2019   High anion gap metabolic acidosis 07/29/8526   Alcohol abuse 05/28/2019   Thrombocytopenia ([place]) 05/28/2019   Pancreatic cyst 05/28/2019   Hepatic lesion 05/28/2019   Dyslipidemia 05/11/2019   Abnormal LFTs 09/12/2018   Medication management 09/12/2018   S/P cervical spinal fusion 01/12/2018   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   Carpal tunnel syndrome of left wrist 12/10/2017   Cervical radiculopathy 11/11/2017   History of fusion of cervical spine 10/22/2017   Cramps, extremity 06/25/2015   Diabetes mellitus type 2, uncontrolled, without complications 78/24/2353   Hand cramps 01/25/2015   Fever, unspecified 03/26/2014   Hx of acute pancreatitis 01/03/2014   Tobacco dependence 01/03/2014   Other and unspecified hyperlipidemia 01/03/2014   GERD (gastroesophageal reflux disease) 11/22/2013   Diabetes mellitus type 2, controlled, without complications ([place]) 61/44/3154   Essential hypertension, benign 10/25/2013   Leukocytosis, unspecified 10/25/2013    Conditions to be addressed/monitored:HTN, HLD, DMII, and hx of alcohol abuse  Care Plan : RN Care Manager Plan of Care  Updates made by Dimitri Ped, RN since 11/04/2021 12:00 AM     Problem: Chronic Disease Management and Care Coordination Needs (DM2,HTN,HLD)   Priority: High     Long-Range Goal: Establish Plan of Care for Chronic Disease Management Needs (DM2,HTN and  HLD)   Start Date: 08/14/2021  Expected End Date: 02/10/2022  Recent Progress: On track  Priority: High  Note:   Current Barriers:  Knowledge Deficits related to plan of care for management of HTN, HLD, DMII, and hx alcohol abuse  Chronic Disease Management support and education needs related to HTN, HLD, DMII, and hx alcohol abuse   Hx of alcohol abuse States he has been doing better.  States he is checking his CBG 3 times a day and they have been ranging in the 105-130 now.  States he did have one low reading of 40 when he accidentally short acting insulin instead of his long acting took his insulin even though the pens are different colors. States he ate to keep his sugar up States he has now moved the pens in separate refrigerators and he double checks now. States his GI issues are better and he is eating better now.  States he is still drinking ensure twice a day. States his weight is up to 135 now State he is watching his portions on CHO States his B/P has been good and ranging 120/80..  States he has only drank zero alcohol beer  when he gets with his friends  States saw his eye doctor yesterday and states he has cataracts but does not know yet if he needs surgery.  States he is still smoking and does not wish to quit at this time.  RNCM Clinical Goal(s):  Patient will verbalize understanding of plan for management of HTN, HLD, DMII, and hx alcohol abuse  as evidenced by voiced adherence to plan of care verbalize basic understanding of  HTN, HLD, DMII, and hx alcohol abuse  disease process and self health management plan as evidenced by voiced understanding and teach back take all medications exactly as prescribed and will call provider for medication related questions as evidenced by dispense report and pt verbalization attend all scheduled medical appointments: no scheduled appointments as evidenced by Dr. Regis Bill 11/24/21 demonstrate Improved adherence to prescribed treatment plan for HTN, HLD,  DMII, and hx alcohol abuse  as evidenced by improved readings and adherence to plan of care continue to work with RN Care Manager to address care management and care coordination needs related to  HTN, HLD, DMII, and hx alcohol abuse  as evidenced by adherence to CM Team Scheduled appointments not experience hospital admission as evidenced by review of EMR. Hospital Admissions in last 6 months = 3  through collaboration with RN Care manager, provider, and care team.   Interventions: 1:1 collaboration with primary care provider regarding development and update of comprehensive plan of care as evidenced by provider attestation and co-signature Inter-disciplinary care team collaboration (see longitudinal plan of care) Evaluation of current treatment plan related to  self management and patient's adherence to plan as established by provider  Diabetes Interventions:  (Status:  Goal on track:  NO.) Long Term Goal Assessed patient's understanding of A1c goal: <7% Provided education to patient about basic DM disease process Reviewed medications with patient and discussed importance of medication adherence Counseled on importance of regular laboratory monitoring as prescribed Discussed plans with patient for ongoing care management follow up and provided patient with direct contact information for care management team Provided patient with written educational materials related to hypo and hyperglycemia and importance of correct treatment Reviewed scheduled/upcoming provider appointments including: Dr. Regis Bill 11/24/21 Advised patient, providing education and rationale, to check cbg 3-4 times a day and record, calling provider for findings outside established parameters Reviewed to always double check his insulin and dosage before he takes it.  Reviewed s/sx of hypoglycemia and how to treat. Lab Results  Component Value Date   HGBA1C 8.3 (H) 07/02/2021   Hyperlipidemia Interventions:  (Status:  Goal on  track:  Yes.) Long Term Goal Medication review performed; medication list updated in electronic medical record.  Provider established cholesterol goals reviewed Counseled on importance of regular laboratory monitoring as prescribed Reviewed role and benefits of statin for ASCVD risk reduction Reviewed importance of limiting foods high in cholesterol Reviewed exercise goals and target of 150 minutes per week  Hypertension Interventions:  (Status:  Goal on track:  Yes.) Long Term Goal Last practice recorded BP readings:  BP Readings from Last 3 Encounters:  07/03/21 118/80  05/19/21 128/80  04/30/21 116/70  Most recent eGFR/CrCl: No results found for: EGFR  No components found for: CRCL  Evaluation of current treatment plan related to hypertension self management and patient's adherence to plan as established by provider Provided education to patient re: stroke prevention, s/s of heart attack and stroke Counseled on adverse effects of illicit drug and excessive alcohol use in patients with high blood  pressure  Counseled on the importance of exercise goals with target of 150 minutes per week Discussed plans with patient for ongoing care management follow up and provided patient with direct contact information for care management team Advised patient, providing education and rationale, to monitor blood pressure daily and record, calling PCP for findings outside established parameters Provided education on prescribed diet low sodium low CHO heart healthy      Hx Alcohol abuse  (Status:  Goal on track:  Yes.)  Long Term Goal Evaluation of current treatment plan related to  Hx Alcohol abuse , Substance abuse issues -  alcohol  self-management and patient's adherence to plan as established by provider. Discussed plans with patient for ongoing care management follow up and provided patient with direct contact information for care management team Evaluation of current treatment plan related to Hx  Alcohol abuse and patient's adherence to plan as established by provider Provided education to patient re: Hx Alcohol abuse Social Work referral for declines referral to LCSW for alcohol abuse Discussed plans with patient for ongoing care management follow up and provided patient with direct contact information for care management team Reinforced to remain alcohol free and to try to avoid social situations when he might want to drink.  Reinforced how alcohol abuse effects his diabetes control and that zero alcohol beer still has carbohydrates     Patient Goals/Self-Care Activities: Take all medications as prescribed Attend all scheduled provider appointments Call pharmacy for medication refills 3-7 days in advance of running out of medications Call provider office for new concerns or questions  keep appointment with eye doctor check blood sugar at prescribed times: 4 times daily and when you have symptoms of low or high blood sugar check feet daily for cuts, sores or redness take the blood sugar log to all doctor visits drink 6 to 8 glasses of water each day fill half of plate with vegetables manage portion size prepare main meal at home 3 to 5 days each week Avoid drinking alcohol check blood pressure 3 times per week choose a place to take my blood pressure (home, clinic or office, retail store) take blood pressure log to all doctor appointments begin an exercise program eat more whole grains, fruits and vegetables, lean meats and healthy fats limit salt intake to 2375m/day call for medicine refill 2 or 3 days before it runs out take all medications exactly as prescribed call doctor with any symptoms you believe are related to your medicine Follow Up Plan:  Telephone follow up appointment with care management team member scheduled for:  12/09/21 The patient has been provided with contact information for the care management team and has been advised to call with any health related  questions or concerns.           Plan:Telephone follow up appointment with care management team member scheduled for:  12/09/21 The patient has been provided with contact information for the care management team and has been advised to call with any health related questions or concerns.  MPeter GarterRN, BJackquline Denmark CDE Care Management Coordinator [facility] ([phone]

## 2021-11-04 NOTE — Patient Instructions (Signed)
Visit Information  Thank you for taking time to visit with me today. Please don't hesitate to contact me if I can be of assistance to you before our next scheduled telephone appointment.  Following are the goals we discussed today:  Take all medications as prescribed Attend all scheduled provider appointments Call pharmacy for medication refills 3-7 days in advance of running out of medications Call provider office for new concerns or questions  keep appointment with eye doctor check blood sugar at prescribed times: 4 times daily and when you have symptoms of low or high blood sugar check feet daily for cuts, sores or redness take the blood sugar log to all doctor visits drink 6 to 8 glasses of water each day fill half of plate with vegetables manage portion size prepare main meal at home 3 to 5 days each week Avoid drinking alcohol check blood pressure 3 times per week choose a place to take my blood pressure (home, clinic or office, retail store) take blood pressure log to all doctor appointments begin an exercise program eat more whole grains, fruits and vegetables, lean meats and healthy fats limit salt intake to 234m/day call for medicine refill 2 or 3 days before it runs out take all medications exactly as prescribed call doctor with any symptoms you believe are related to your medicine Insulin Storage and Care All insulin pens and bottles (vials) have expiration dates. Insulin pens and vials that are refrigerated and unopened are good until the expiration date. Once in use, vial and pen expiration dates vary depending on the type of insulin being used. For vials, in use means that the rubber has been punctured. Do not use insulin after the expiration date. Always follow the instructions that come with your insulin. How to store your insulin Store your insulin according to the instructions on the packaging. Insulin storage recommendations vary depending on the type of insulin  and manufacturer. If insulin is kept at room temperature, keep the temperature between 59-88F (15-30C). If insulin is kept in the refrigerator, keep the temperature between 36-32F (3-8C). If insulin is kept in the refrigerator, warm it to room temperature before use. Cold insulin stings when injected. Do not freeze insulin. Keep insulin away from direct heat or sunlight. How to throw away your supplies  Get rid of your insulin if: It is expired. It is discolored. It is thick. It has clumps in it. It has white particles suspended in it. Discard all used needles in a puncture-proof sharps disposal container. You can ask your local pharmacy about where you can get a sharps container. You can also use an empty liquid laundry detergent bottle that has a lid. Follow the disposal regulations for the area where you live. Do not use any syringe or needle more than one time. Throw away empty vials and pens (with needles removed) in the regular trash. Follow these instructions at home Always keep extra insulin and supplies with you. Always inspect your insulin prior to injecting. Do not use the insulin if you notice any discoloration, particles, or clumping. Mix cloudy insulin by gently rolling the vial or pen between your palms several times. Do not  shake the vial or pen. Do not leave insulin in your vehicle or in any place where it can get too hot or too cold. Use an insulated travel pack to store your insulin vials or pens when traveling. Where to find more information American Diabetes Association: www.diabetes.o[employer]of Diabetes and Digestive and Kidney  Diseases: [place] Association of Diabetes Care & Education Specialists: www.diabeteseducator.org Summary All insulin pens and bottles (vials) have expiration dates. Check the expiration date before using the insulin. Do not use insulin past the expiration date. Check insulin before using to make sure it looks  normal. Mix cloudy insulin before using. Do not use insulin if it is discolored or has particles or clumps. Store your insulin according to the directions on the packaging. Do not store insulin in extreme heat or cold, such as in the freezer, direct sunlight, or in your vehicle. This information is not intended to replace advice given to you by your health care provider. Make sure you discuss any questions you have with your health care provider. Document Revised: 07/09/2020 Document Reviewed: 07/09/2020 Elsevier Patient Education  2022 [place] next appointment is by telephone on 12/09/21 at 3 PM  Please call the care guide team at [phone] if you need to cancel or reschedule your appointment.   If you are experiencing a Mental Health or [place] or need someone to talk to, please call the Suicide and Crisis Lifeline: 988 call the [place] National Suicide Prevention Lifeline: 2600091376 or TTY: 478-186-1552 TTY 804-855-6223) to talk to a trained counselor call 1-800-273-TALK (toll free, 24 hour hotline) call 911   Patient verbalizes understanding of instructions and care plan provided today and agrees to view in [place]. Active MyChart status confirmed with patient.   Peter Garter RN, Jackquline Denmark, CDE Care Management Coordinator  [facility] [phone]

## 2021-11-24 ENCOUNTER — Encounter: Payer: Medicare Other | Admitting: Internal Medicine

## 2021-12-09 ENCOUNTER — Telehealth: Payer: Medicare Other

## 2021-12-09 ENCOUNTER — Telehealth: Payer: Self-pay

## 2021-12-09 NOTE — Telephone Encounter (Signed)
?  Care Management  ? ?Follow Up Note ? ? ?12/09/2021 ?Name: Todd Rodriguez MRN: [id number] DOB: 07/21/1976 ? ? ?Referred by: Burnis Medin, MD ?Reason for referral : Chronic Care Management (RNCM: Follow up Outreach Chronic Care Management and coordination needs-unsuccessful outreach) ? ? ?An unsuccessful telephone outreach was attempted today. The patient was referred to the case management team for assistance with care management and care coordination.  ? ?Follow Up Plan: The care management team will reach out to the patient again over the next 30 days.  ? ?Peter Garter RN, BSN,CCM, CDE ?Care Management Coordinator ?[facility] ?[phone]  ? ?

## 2021-12-14 ENCOUNTER — Other Ambulatory Visit: Payer: Self-pay | Admitting: Gastroenterology

## 2021-12-15 NOTE — Telephone Encounter (Signed)
Last office visit 07/09/21 ?

## 2021-12-23 NOTE — Telephone Encounter (Signed)
Rescheduled for 3/23 ? ?Laverda Sorenson  ?Care Guide, Embedded Care Coordination ?[place]  [facility]  ?Direct Dial: [phone] ? ?

## 2021-12-25 ENCOUNTER — Ambulatory Visit (INDEPENDENT_AMBULATORY_CARE_PROVIDER_SITE_OTHER): Payer: Medicare Other

## 2021-12-25 DIAGNOSIS — E785 Hyperlipidemia, unspecified: Secondary | ICD-10-CM

## 2021-12-25 DIAGNOSIS — E1165 Type 2 diabetes mellitus with hyperglycemia: Secondary | ICD-10-CM

## 2021-12-25 DIAGNOSIS — I10 Essential (primary) hypertension: Secondary | ICD-10-CM

## 2021-12-25 DIAGNOSIS — F1010 Alcohol abuse, uncomplicated: Secondary | ICD-10-CM

## 2021-12-25 NOTE — Chronic Care Management (AMB) (Signed)
?Chronic Care Management  ? ?CCM RN Visit Note ? ?12/25/2021 ?Name: Todd Rodriguez MRN: [id number] DOB: Mar 02, 1976 ? ?Subjective: ?Todd Rodriguez is a 46 y.o. year old male who is a primary care patient of Panosh, Standley Brooking, MD. The care management team was consulted for assistance with disease management and care coordination needs.   ? ?Engaged with patient by telephone for follow up visit in response to provider referral for case management and/or care coordination services.  ? ?Consent to Services:  ?The patient was given information about Chronic Care Management services, agreed to services, and gave verbal consent prior to initiation of services.  Please see initial visit note for detailed documentation.  ? ?Patient agreed to services and verbal consent obtained.  ? ?Assessment: Review of patient past medical history, allergies, medications, health status, including review of consultants reports, laboratory and other test data, was performed as part of comprehensive evaluation and provision of chronic care management services.  ? ?SDOH (Social Determinants of Health) assessments and interventions performed:   ? ?CCM Care Plan ? ?No Known Allergies ? ?Outpatient Encounter Medications as of 12/25/2021  ?Medication Sig  ? albuterol (VENTOLIN HFA) 108 (90 Base) MCG/ACT inhaler Inhale 2 puffs into the lungs every 4 (four) hours as needed for wheezing or shortness of breath.  ? amlodipine-benazepril (LOTREL) 2.5-10 MG capsule Take 1 capsule by mouth daily. For high blood pressure .Dose adjustment  ? atorvastatin (LIPITOR) 40 MG tablet Take 1 tablet (40 mg total) by mouth daily.  ? Brimonidine Tartrate (LUMIFY) 0.025 % SOLN Place 1 drop into both eyes daily as needed (eye irritation/dry eyes.).  ? citalopram (CELEXA) 20 MG tablet Take 10 mg per day for 1-2 weeks then increase to 20 mg per day or as directed  ? Continuous Blood Gluc Sensor (DEXCOM G6 SENSOR) MISC 1 Device by Does not apply route as directed.  ? Continuous  Blood Gluc Transmit (DEXCOM G6 TRANSMITTER) MISC 1 Device by Does not apply route as directed.  ? Cyanocobalamin (VITAMIN B-12 PO) Take 1 tablet by mouth daily.  ? dicyclomine (BENTYL) 10 MG capsule TAKE 1 CAPSULE FOUR TIMES A DAY BEFORE MEALS AND AT BEDTIME AS NEEDED FOR ABDOMINAL CRAMPING  ? empagliflozin (JARDIANCE) 25 MG TABS tablet Take 1 tablet (25 mg total) by mouth daily.  ? folic acid (FOLVITE) 1 MG tablet Take 1 tablet (1 mg total) by mouth daily.  ? gabapentin (NEURONTIN) 300 MG capsule Take 1 capsule (300 mg total) by mouth 3 (three) times daily.  ? glucose blood (ONE TOUCH TEST STRIPS) test strip Use as instructed  ? insulin aspart (NOVOLOG FLEXPEN) 100 UNIT/ML FlexPen Inject 6 Units into the skin 3 (three) times daily with meals.  ? Insulin Pen Needle 32G X 4 MM MISC 1 Device by Does not apply route in the morning, at noon, in the evening, and at bedtime.  ? lipase/protease/amylase (CREON) 36000 UNITS CPEP capsule Take 1-2 capsules (36,000-72,000 Units total) by mouth See admin instructions. Take two with meals and one with snacks (total of 8 per day)  ? LORazepam (ATIVAN) 1 MG tablet Take 1 tablet (1 mg total) by mouth every 8 (eight) hours as needed for anxiety. Panic attacks  ? magnesium oxide (MAG-OX) 400 (240 Mg) MG tablet Take 1 tablet by mouth daily.  ? Multiple Vitamin (MULTIVITAMIN WITH MINERALS) TABS tablet Take 1 tablet by mouth daily.  ? ondansetron (ZOFRAN ODT) 4 MG disintegrating tablet Take 1 tablet (4 mg total) by mouth every 8 (eight)  hours as needed for nausea or vomiting.  ? pantoprazole (PROTONIX) 40 MG tablet TAKE 1 TABLET BY MOUTH EVERY DAY  ? thiamine 100 MG tablet Take 1 tablet (100 mg total) by mouth daily.  ? TRESIBA FLEXTOUCH 100 UNIT/ML FlexTouch Pen Inject 18 Units into the skin daily.  ? ?No facility-administered encounter medications on file as of 12/25/2021.  ? ? ?Patient Active Problem List  ? Diagnosis Date Noted  ? DKA, type 2 ([place]) 07/01/2021  ? DKA (diabetic  ketoacidosis) ([place]) 06/30/2021  ? Hyponatremia 06/30/2021  ? Hyperglycemia 06/30/2021  ? Hypothermia 06/30/2021  ? COVID-19 virus infection 06/30/2021  ? Hyperkalemia 06/30/2021  ? AKI (acute kidney injury) ([place]) 06/30/2021  ? Nausea & vomiting 06/30/2021  ? Failure to thrive in adult 06/30/2021  ? Starvation ketoacidosis 06/30/2021  ? Tobacco abuse 06/30/2021  ? Loose stools 08/27/2020  ? Abnormal weight loss 04/26/2020  ? Rectal bleeding 04/26/2020  ? Pancreatic pseudocyst   ? Protein-calorie malnutrition, severe 05/29/2019  ? High anion gap metabolic acidosis 29/56/2130  ? Alcohol abuse 05/28/2019  ? Thrombocytopenia ([place]) 05/28/2019  ? Pancreatic cyst 05/28/2019  ? Hepatic lesion 05/28/2019  ? Dyslipidemia 05/11/2019  ? Abnormal LFTs 09/12/2018  ? Medication management 09/12/2018  ? S/P cervical spinal fusion 01/12/2018  ? 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 Carpal tunnel syndrome of left wrist 12/10/2017  ? Cervical radiculopathy 11/11/2017  ? History of fusion of cervical spine 10/22/2017  ? Cramps, extremity 06/25/2015  ? Diabetes mellitus type 2, uncontrolled, without complications 86/57/8469  ? Hand cramps 01/25/2015  ? Fever, unspecified 03/26/2014  ? Hx of acute pancreatitis 01/03/2014  ? Tobacco dependence 01/03/2014  ? Other and unspecified hyperlipidemia 01/03/2014  ? GERD (gastroesophageal reflux disease) 11/22/2013  ? Diabetes mellitus type 2, controlled, without complications ([place]) 62/95/2841  ? Essential hypertension, benign 10/25/2013  ? Leukocytosis, unspecified 10/25/2013  ? ? ?Conditions to be addressed/monitored:HTN, HLD, DMII, and hx alcohol abuse ? ?Care Plan : RN Care Manager Plan of Care  ?Updates made by Dimitri Ped, RN since 12/25/2021 12:00 AM  ?  ? ?Problem: Chronic Disease Management and Care Coordination Needs (DM2,HTN,HLD)   ?Priority: High  ?  ? ?Long-Range Goal: Establish Plan of Care for Chronic Disease Management Needs (DM2,HTN and HLD)   ?Start  Date: 08/14/2021  ?Expected End Date: 12/25/2022  ?Recent Progress: On track  ?Priority: High  ?Note:   ?Current Barriers:  ?Knowledge Deficits related to plan of care for management of HTN, HLD, DMII, and hx alcohol abuse  ?Chronic Disease Management support and education needs related to HTN, HLD, DMII, and hx alcohol abuse   ?Hx of alcohol abuse ?States he has been doing good.  States he had cataract surgery on his lt eye on 12/19/21 and he will have the rt done on 12/30/21.  States he has not been driving as his vision has been blurry while he is healing.  States he is checking his CBG 3 times a day and they have been ranging around 150 and no higher than 200.  States he has had 2 low reading on Saturday mornings of 65 and 70 which he does not know why his sugars went lower. States his GI issues are better and he is eating better now.  States he is still drinking ensure twice a day. States his weight is up to 135. State he is watching his portions on CHO States his B/P has been good and ranging 120/80.Marland Kitchen  States he has not been drink any alcohol.  States he is still smoking and does not wish to quit at this time.  ?RNCM Clinical Goal(s):  ?Patient will verbalize understanding of plan for management of HTN, HLD, DMII, and hx alcohol abuse  as evidenced by voiced adherence to plan of care ?verbalize basic understanding of  HTN, HLD, DMII, and hx alcohol abuse  disease process and self health management plan as evidenced by voiced understanding and teach back ?take all medications exactly as prescribed and will call provider for medication related questions as evidenced by dispense report and pt verbalization ?attend all scheduled medical appointments: no scheduled appointments as evidenced by no upcoming scheduled ?demonstrate Improved adherence to prescribed treatment plan for HTN, HLD, DMII, and hx alcohol abuse  as evidenced by improved readings and adherence to plan of care ?continue to work with RN Care Manager  to address care management and care coordination needs related to  HTN, HLD, DMII, and hx alcohol abuse  as evidenced by adherence to CM Team Scheduled appointments ?not experience hospital admission as eviden

## 2021-12-25 NOTE — Patient Instructions (Signed)
Visit Information ? ?Thank you for taking time to visit with me today. Please don't hesitate to contact me if I can be of assistance to you before our next scheduled telephone appointment. ? ?Following are the goals we discussed today:  ?Take all medications as prescribed ?Attend all scheduled provider appointments ?Call pharmacy for medication refills 3-7 days in advance of running out of medications ?Call provider office for new concerns or questions  ?keep appointment with eye doctor ?check blood sugar at prescribed times: 4 times daily and when you have symptoms of low or high blood sugar ?check feet daily for cuts, sores or redness ?take the blood sugar log to all doctor visits ?drink 6 to 8 glasses of water each day ?fill half of plate with vegetables ?manage portion size ?prepare main meal at home 3 to 5 days each week ?Avoid drinking alcohol ?check blood pressure 3 times per week ?choose a place to take my blood pressure (home, clinic or office, retail store) ?take blood pressure log to all doctor appointments ?begin an exercise program ?eat more whole grains, fruits and vegetables, lean meats and healthy fats ?limit salt intake to 2359m/day ?call for medicine refill 2 or 3 days before it runs out ?take all medications exactly as prescribed ?call doctor with any symptoms you believe are related to your medicine ? ?Our next appointment is by telephone on 03/05/22 at 3 PM ? ?Please call the care guide team at [phone] you need to cancel or reschedule your appointment.  ? ?If you are experiencing a Mental Health or [place] need someone to talk to, please call the Suicide and Crisis Lifeline: 988 ?call the [employer] Suicide Prevention Lifeline: 1712 287 1333or TTY: 1620-884-6230TTY ([phone] to talk to a trained counselor ?call 1-800-273-TALK (toll free, 24 hour hotline) ?call 911  ? ?Patient verbalizes understanding of instructions and care plan provided today and agrees to  view in [facility] Active MyChart status confirmed with patient.   ?MPeter GarterRN, BSN,CCM, CDE ?Care Management Coordinator ?[facility] ?[phone]  ?

## 2022-01-02 DIAGNOSIS — E1165 Type 2 diabetes mellitus with hyperglycemia: Secondary | ICD-10-CM | POA: Diagnosis not present

## 2022-01-02 DIAGNOSIS — E785 Hyperlipidemia, unspecified: Secondary | ICD-10-CM | POA: Diagnosis not present

## 2022-01-02 DIAGNOSIS — I10 Essential (primary) hypertension: Secondary | ICD-10-CM | POA: Diagnosis not present

## 2022-02-20 ENCOUNTER — Telehealth: Payer: Self-pay

## 2022-02-24 ENCOUNTER — Ambulatory Visit: Payer: Medicare Other | Admitting: Internal Medicine

## 2022-03-04 ENCOUNTER — Ambulatory Visit: Payer: Medicare Other | Admitting: Internal Medicine

## 2022-03-05 ENCOUNTER — Telehealth: Payer: Self-pay

## 2022-03-05 ENCOUNTER — Telehealth: Payer: Medicare Other

## 2022-03-05 NOTE — Telephone Encounter (Signed)
  Care Management   Follow Up Note   03/05/2022 Name: Todd Rodriguez MRN: [id number] DOB: 1975-11-29   Referred by: Burnis Medin, MD Reason for referral : Chronic Care Management (RNCM: Follow up Outreach Chronic Care Management and coordination needs-unsuccessful outreach)   An unsuccessful telephone outreach was attempted today. The patient was referred to the case management team for assistance with care management and care coordination.   Follow Up Plan: The care management team will reach out to the patient again over the next 30 days.   Peter Garter RN, Jackquline Denmark, CDE Care Management Coordinator [facility] [phone]

## 2022-03-06 ENCOUNTER — Telehealth: Payer: Self-pay | Admitting: *Deleted

## 2022-03-06 NOTE — Chronic Care Management (AMB) (Signed)
  Chronic Care Management Note  03/06/2022 Name: Todd Rodriguez MRN: [id number] DOB: 1976/02/25  Todd Rodriguez is a 46 y.o. year old male who is a primary care patient of Panosh, Standley Brooking, MD and is actively engaged with the care management team. I reached out to Hillery Hunter by phone today to assist with re-scheduling a follow up visit with the RN Case Manager  Follow up plan: Unsuccessful telephone outreach attempt made. A HIPAA compliant phone message was left for the patient providing contact information and requesting a return call.  The care management team will reach out to the patient again over the next 7 days.  If patient returns call to provider office, please advise to call [place] at [phone].  [facility] Management  Direct Dial: [phone]

## 2022-03-16 NOTE — Chronic Care Management (AMB) (Signed)
  Chronic Care Management Note  03/16/2022 Name: Todd Rodriguez MRN: [id number] DOB: Dec 10, 1975  Todd Rodriguez is a 46 y.o. year old male who is a primary care patient of Panosh, Standley Brooking, MD and is actively engaged with the care management team. I reached out to Hillery Hunter by phone today to assist with re-scheduling a follow up visit with the RN Case Manager  Follow up plan: Unsuccessful telephone outreach attempt made. A HIPAA compliant phone message was left for the patient providing contact information and requesting a return call.  The care management team will reach out to the patient again over the next 7 days.  If patient returns call to provider office, please advise to call [place] at [phone].  [facility] Management  Direct Dial: [phone]

## 2022-03-23 NOTE — Chronic Care Management (AMB) (Signed)
  Chronic Care Management Note  03/23/2022 Name: Todd Rodriguez MRN: [id number] DOB: Apr 05, 1976  Todd Rodriguez is a 46 y.o. year old male who is a primary care patient of Panosh, Standley Brooking, MD and is actively engaged with the care management team. I reached out to Hillery Hunter by phone today to assist with re-scheduling a follow up visit with the RN Case Manager  Follow up plan: Telephone appointment with care management team member scheduled for:03/30/22  [place] Management  Direct Dial: [phone]

## 2022-03-30 ENCOUNTER — Ambulatory Visit: Payer: Medicare Other

## 2022-03-30 ENCOUNTER — Telehealth: Payer: Self-pay | Admitting: Internal Medicine

## 2022-03-30 NOTE — Chronic Care Management (AMB) (Addendum)
  Care Management   Follow Up Note   03/30/2022 Name: Todd Rodriguez MRN: [id number] DOB: Aug 23, 1976   Referred by: Burnis Medin, MD Reason for referral : Chronic Care Management (RNCM: Follow up Outreach Chronic Care Management and coordination needs-Case closed unable to contact maintain )   Case closed goals not met  Numerous no shows and nonadherent to plan of care.   Follow Up Plan: No further follow up required: Goals not met   Peter Garter RN, [facility], CDE Care Management Coordinator [facility] [phone]

## 2022-03-31 NOTE — Telephone Encounter (Addendum)
Patient has a few no show with PCP. And Endocrinology dismissed him because he has been a no show. Dr. Regis Bill would like Outreach or other resources to assist him. Please advise

## 2022-04-14 ENCOUNTER — Telehealth: Payer: Self-pay | Admitting: Internal Medicine

## 2022-04-14 NOTE — Telephone Encounter (Signed)
Noted waiting on PCP approval

## 2022-04-14 NOTE — Telephone Encounter (Signed)
Aniceto Boss / [facility] called to check status of the CGM and office notes requested via fax on June 23rd, 2023.      Fax number was confirmed.  She will fax again.

## 2022-04-14 NOTE — Telephone Encounter (Signed)
Forwarding to PCP.

## 2022-04-15 ENCOUNTER — Ambulatory Visit (INDEPENDENT_AMBULATORY_CARE_PROVIDER_SITE_OTHER): Payer: Medicare Other | Admitting: Internal Medicine

## 2022-04-15 ENCOUNTER — Encounter: Payer: Self-pay | Admitting: Internal Medicine

## 2022-04-15 VITALS — BP 84/59 | HR 98 | Temp 98.1°F | Ht 74.0 in | Wt 126.0 lb

## 2022-04-15 DIAGNOSIS — K219 Gastro-esophageal reflux disease without esophagitis: Secondary | ICD-10-CM | POA: Diagnosis not present

## 2022-04-15 DIAGNOSIS — R634 Abnormal weight loss: Secondary | ICD-10-CM | POA: Diagnosis not present

## 2022-04-15 DIAGNOSIS — R197 Diarrhea, unspecified: Secondary | ICD-10-CM

## 2022-04-15 DIAGNOSIS — R935 Abnormal findings on diagnostic imaging of other abdominal regions, including retroperitoneum: Secondary | ICD-10-CM

## 2022-04-15 MED ORDER — EMPAGLIFLOZIN 25 MG PO TABS: 25.0000 mg | ORAL_TABLET | Freq: Every day | ORAL | 0 refills | Status: DC

## 2022-04-15 NOTE — Progress Notes (Signed)
Referring Provider: Burnis Medin, MD Primary Care Physician:  Burnis Medin, MD Primary GI:  Dr. Abbey Chatters  Chief Complaint  Patient presents with   Crohn's Disease    Follow up on Crohn's disease. About one month ago started having increase in BM's. States he has at least 10 stools per day. No control of BM.     HPI:   Todd Rodriguez is a 46 y.o. male who presents  today for follow-up visit.  Has a history of chronic GERD, weight loss, rectal bleeding, pancreatic pseudocyst, hepatic steatosis, gallstones versus gallbladder sludge, chronic alcohol abuse.   EGD and colonoscopy in August 2021.  Noted to have moderately severe esophagitis with no bleeding found in the middle third of the esophagus, suspicious for Candida esophagitis.  KOH was negative.  He was treated with 2 weeks of Diflucan.  GE junction biopsy showed GERD with mild inflammation consistent with reflux.  Patient had gastritis.  Duodenal biopsies benign. Duodenal polyp biopsy, Brunner's gland hyperplasia and mild changes suggestive of peptic injury. No findings consistent with celiac.   Repeat EGD December 2021 to document healing and screen for Barrett's, he had irregular Z-line but negative biopsy for Barrett's esophagus.  He did have some ongoing chronic inflammation of the esophagus but much improved from prior EGD.  Gastritis noted.  Advised to continue daily pantoprazole.   Colonoscopy August 2021 with 2 cecal polyps removed, inflammatory, 1 sigmoid polyp removed, hyperplastic, granular mucosa in the transverse colon biopsy negative.   Has a history of pancreatic pseudocyst which has been followed with MRIs, most recent MRI January 2022 which showed no significant change, recommended against further surveillance imaging as this has been stable.   Chronic GERD well-controlled on pantoprazole daily.  Rectal bleeding has resolved.   Today, patient states that he has had new onset diarrhea x4 to 6 weeks.  States over 10  loose bowel movements daily.  No melena hematochezia.  No abdominal pain.  States as soon as he eats he is going to the bathroom.  Wearing depends.  Also notes significant weight loss.  He is down to 126 pounds today.  Also wheelchair-bound now.  I saw patient virtually October 2022 at which time he was having diarrhea as well from recent hospitalization.  This resolved on its own.  Past Medical History:  Diagnosis Date   Acute pancreatitis 10/25/2013   Arthritis    Diabetes mellitus without complication (HCC)    GERD (gastroesophageal reflux disease)    Hepatic steatosis    History of pancreatitis    Hypertension    Myalgia and myositis 04/21/2014   much better from illness presumed infectious    has disability form  will complete     Pancreatic pseudocyst     Past Surgical History:  Procedure Laterality Date   ANTERIOR CERVICAL DECOMP/DISCECTOMY FUSION N/A 01/12/2018   Procedure: Revision ACDF C4-5, removal of hardware C5-6, exploration of fusion C5-6;  Surgeon: [name], MD;  Location: [place];  Service: Orthopedics;  Laterality: N/A;  3.5 hrs   BIOPSY  05/13/2020   Procedure: BIOPSY;  Surgeon: Eloise Harman, DO;  Location: [facility];  Service: Endoscopy;;   BIOPSY  09/23/2020   Procedure: BIOPSY;  Surgeon: Eloise Harman, DO;  Location: [facility];  Service: Endoscopy;;   COLONOSCOPY WITH PROPOFOL N/A 05/13/2020   Dr. Abbey Chatters: Nonbleeding internal hemorrhoids.  Few small mouth diverticula in the sigmoid colon.  2 cecal polyps, inflammatory.  Sigmoid  colon polyp hyperplastic.  Localized area of granular mucosa in the transverse colon, status post biopsy which was unremarkable.  No microscopic colitis.  Plans for repeat colonoscopy in 5 years.   ESOPHAGEAL BRUSHING  05/13/2020   Procedure: ESOPHAGEAL BRUSHING;  Surgeon: Eloise Harman, DO;  Location: [facility];  Service: Endoscopy;;  mid esophagus   ESOPHAGOGASTRODUODENOSCOPY (EGD) WITH PROPOFOL N/A 05/13/2020   Dr.  Abbey Chatters: Moderately severe esophagitis with no bleeding found in the middle third of the esophagus, suspicious for Candida esophagitis.  KOH was negative. Gastritis, duodenal polyp.  Duodenal biopsy benign.  Duodenal bulb biopsy with Brunner's gland hyperplasia and mild changes suggestive of peptic injury.  No celiac disease seen.  GE junction biopsy showed GERD with mild inflammation consisten   ESOPHAGOGASTRODUODENOSCOPY (EGD) WITH PROPOFOL N/A 09/23/2020   Procedure: ESOPHAGOGASTRODUODENOSCOPY (EGD) WITH PROPOFOL;  Surgeon: Eloise Harman, DO;  Location: [facility];  Service: Endoscopy;  Laterality: N/A;  7:30am   NECK SURGERY     2 disc   POLYPECTOMY  05/13/2020   Procedure: POLYPECTOMY;  Surgeon: Eloise Harman, DO;  Location: [facility];  Service: Endoscopy;;   SPINAL FUSION      Current Outpatient Medications  Medication Sig Dispense Refill   albuterol (VENTOLIN HFA) 108 (90 Base) MCG/ACT inhaler Inhale 2 puffs into the lungs every 4 (four) hours as needed for wheezing or shortness of breath. 18 g 0   amlodipine-benazepril (LOTREL) 2.5-10 MG capsule Take 1 capsule by mouth daily. For high blood pressure .Dose adjustment 90 capsule 1   atorvastatin (LIPITOR) 40 MG tablet Take 1 tablet (40 mg total) by mouth daily. 90 tablet 1   Brimonidine Tartrate (LUMIFY) 0.025 % SOLN Place 1 drop into both eyes daily as needed (eye irritation/dry eyes.).     citalopram (CELEXA) 20 MG tablet Take 10 mg per day for 1-2 weeks then increase to 20 mg per day or as directed 90 tablet 1   Continuous Blood Gluc Sensor (DEXCOM G6 SENSOR) MISC 1 Device by Does not apply route as directed. 9 each 3   Continuous Blood Gluc Transmit (DEXCOM G6 TRANSMITTER) MISC 1 Device by Does not apply route as directed. 1 each 3   Cyanocobalamin (VITAMIN B-12 PO) Take 1 tablet by mouth daily.     dicyclomine (BENTYL) 10 MG capsule TAKE 1 CAPSULE FOUR TIMES A DAY BEFORE MEALS AND AT BEDTIME AS NEEDED FOR ABDOMINAL CRAMPING  360 capsule 0   empagliflozin (JARDIANCE) 25 MG TABS tablet Take 1 tablet (25 mg total) by mouth daily. 90 tablet 3   folic acid (FOLVITE) 1 MG tablet Take 1 tablet (1 mg total) by mouth daily. 90 tablet 3   gabapentin (NEURONTIN) 300 MG capsule Take 1 capsule (300 mg total) by mouth 3 (three) times daily. 90 capsule 3   glucose blood (ONE TOUCH TEST STRIPS) test strip Use as instructed 100 each 11   insulin aspart (NOVOLOG FLEXPEN) 100 UNIT/ML FlexPen Inject 6 Units into the skin 3 (three) times daily with meals. 15 mL 6   Insulin Pen Needle 32G X 4 MM MISC 1 Device by Does not apply route in the morning, at noon, in the evening, and at bedtime. 400 each 3   lipase/protease/amylase (CREON) 36000 UNITS CPEP capsule Take 1-2 capsules (36,000-72,000 Units total) by mouth See admin instructions. Take two with meals and one with snacks (total of 8 per day) 480 capsule 5   LORazepam (ATIVAN) 1 MG tablet Take 1  tablet (1 mg total) by mouth every 8 (eight) hours as needed for anxiety. Panic attacks 20 tablet 0   Multiple Vitamin (MULTIVITAMIN WITH MINERALS) TABS tablet Take 1 tablet by mouth daily. 120 tablet 1   ondansetron (ZOFRAN ODT) 4 MG disintegrating tablet Take 1 tablet (4 mg total) by mouth every 8 (eight) hours as needed for nausea or vomiting. 20 tablet 0   pantoprazole (PROTONIX) 40 MG tablet TAKE 1 TABLET BY MOUTH EVERY DAY 90 tablet 1   thiamine 100 MG tablet Take 1 tablet (100 mg total) by mouth daily. 30 tablet 3   TRESIBA FLEXTOUCH 100 UNIT/ML FlexTouch Pen Inject 18 Units into the skin daily. 15 mL 6   magnesium oxide (MAG-OX) 400 (240 Mg) MG tablet Take 1 tablet by mouth daily. (Patient not taking: Reported on 04/15/2022)     No current facility-administered medications for this visit.    Allergies as of 04/15/2022   (No Known Allergies)    Family History  Problem Relation Age of Onset   Diabetes Mother    Hypertension Mother    Diabetes Father    Hypertension Father     Arthritis Maternal Grandmother    Hypertension Maternal Grandfather    Diabetes Maternal Grandfather    Colon cancer Neg Hx    Pancreatitis Neg Hx    Pancreatic cancer Neg Hx     Social History   Socioeconomic History   Marital status: Married    Spouse name: Not on file   Number of children: 1   Years of education: Not on file   Highest education level: Not on file  Occupational History    Employer: [employer]  Tobacco Use   Smoking status: Every Day    Packs/day: 1.00    Years: 12.00    Total pack years: 12.00    Types: Cigarettes   Smokeless tobacco: Never  Vaping Use   Vaping Use: Never used  Substance and Sexual Activity   Alcohol use: Not Currently    Comment: every weekend, one drink. 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             Social Determinants of Health   Financial Resource Strain: Low Risk  (06/12/2021)   Overall Financial Resource Strain (CARDIA)    Difficulty of Paying Living Expenses: Not hard at all  Food Insecurity: No Food Insecurity (06/12/2021)   Hunger Vital Sign    Worried About Running Out of Food in the Last Year: Never true    Gu-Win in the Last Year: Never true  Transportation Needs: No Transportation Needs (06/12/2021)   PRAPARE - Hydrologist (Medical): No    Lack of Transportation (Non-Medical): No  Physical Activity: Insufficiently Active (05/29/2021)   Exercise Vital Sign    Days of Exercise per Week: 3 days    Minutes of Exercise per Session: 30 min  Stress: No Stress Concern  Present  (06/12/2021)   [place]    Feeling of Stress : Not at all  Social Connections: Socially Integrated (05/29/2021)   Social Connection and Isolation Panel [NHANES]    Frequency of Communication with Friends and Family: Three times a week    Frequency of Social Gatherings with Friends and Family: Three times a week    Attends Religious Services: More than 4 times per year    Active Member of Clubs or Organizations: Yes    Attends Archivist Meetings: More than 4 times per year    Marital Status: Married    Subjective: Review of Systems  Constitutional:  Positive for weight loss. Negative for chills and fever.  HENT:  Negative for congestion and hearing loss.   Eyes:  Negative for blurred vision and double vision.  Respiratory:  Negative for cough and shortness of breath.   Cardiovascular:  Negative for chest pain and palpitations.  Gastrointestinal:  Positive for diarrhea. Negative for abdominal pain, blood in stool, constipation, heartburn, melena and vomiting.  Genitourinary:  Negative for dysuria and urgency.  Musculoskeletal:  Negative for joint pain and myalgias.  Skin:  Negative for itching and rash.  Neurological:  Negative for dizziness and headaches.  Psychiatric/Behavioral:  Negative for depression. The patient is not nervous/anxious.      Objective: There were no vitals taken for this visit. Physical Exam Constitutional:      Comments: Cachectic, in wheelchair  HENT:     Head: Normocephalic and atraumatic.  Eyes:     Extraocular Movements: Extraocular movements intact.     Conjunctiva/sclera: Conjunctivae normal.  Cardiovascular:     Rate and Rhythm: Normal rate and regular rhythm.  Pulmonary:     Effort: Pulmonary effort is normal.     Breath sounds: Normal breath sounds.  Abdominal:     General: Bowel sounds are normal.     Palpations: Abdomen is soft.  Musculoskeletal:        General:  Normal range of motion.     Cervical back: Normal range of motion and neck supple.  Skin:    General: Skin is warm.  Neurological:     General: No focal deficit present.     Mental Status: He is alert and oriented to person, place, and time.  Psychiatric:        Mood and Affect: Mood normal.        Behavior: Behavior normal.      Assessment and Plan: *GERD-well-controlled on pantoprazole *Pancreatic pseudocyst-stable, no further imaging recommended *Diarrhea-new, worsening *Chronic alcohol abuse-resolved *Weight loss    Follow Up Instructions: GERD well-controlled on pantoprazole, will continue.  No further imaging recommended for pancreatic pseudocyst.  Counseled on importance of continued alcohol cessation.  Unclear etiology of patient's diarrhea, weight loss.  Continue on pancreatic enzymes.  We will check stool studies today to rule out infectious pathogens.  If negative can consider Lomotil.  We will also check CBC, CMP, CA 19-9, TSH.    Follow-up in 6 to 8 weeks.  04/15/2022 9:25 AM   Disclaimer: This note was dictated with voice recognition software. Similar sounding words can inadvertently be transcribed and may not be corrected upon review.

## 2022-04-15 NOTE — Telephone Encounter (Signed)
Rx sent to pharmacy   

## 2022-04-15 NOTE — Patient Instructions (Signed)
For your chronic diarrhea and weight loss, I am going to check stool studies to rule out infectious causes.  If this is negative, I will send in a prescription antidiarrheal medication.  I am also going to check blood work to be performed at [facility] as well.  Follow-up in 6 to 8 weeks.  It was good seeing you again today.  Dr. Abbey Chatters

## 2022-04-15 NOTE — Addendum Note (Signed)
Addended by: Geradine Girt D on: 04/15/2022 09:59 AM   Modules accepted: Orders

## 2022-04-15 NOTE — Telephone Encounter (Signed)
Rx sent to the pharmacy.

## 2022-04-15 NOTE — Telephone Encounter (Signed)
can refill the jardiance for  1 month.   Patient has been no shows and non adherant to  medical plans   will address at upcoming visit

## 2022-04-16 ENCOUNTER — Encounter (HOSPITAL_COMMUNITY): Payer: Self-pay | Admitting: *Deleted

## 2022-04-16 ENCOUNTER — Emergency Department (HOSPITAL_COMMUNITY): Payer: Medicare Other

## 2022-04-16 ENCOUNTER — Inpatient Hospital Stay (HOSPITAL_COMMUNITY)
Admission: EM | Admit: 2022-04-16 | Discharge: 2022-04-18 | DRG: 391 | Disposition: A | Discharge status: Home / Self Care | Payer: Medicare Other | Attending: Internal Medicine | Admitting: Internal Medicine

## 2022-04-16 ENCOUNTER — Other Ambulatory Visit: Payer: Self-pay

## 2022-04-16 DIAGNOSIS — D61818 Other pancytopenia: Secondary | ICD-10-CM | POA: Diagnosis present

## 2022-04-16 DIAGNOSIS — T39395A Adverse effect of other nonsteroidal anti-inflammatory drugs [NSAID], initial encounter: Secondary | ICD-10-CM | POA: Diagnosis present

## 2022-04-16 DIAGNOSIS — E1165 Type 2 diabetes mellitus with hyperglycemia: Secondary | ICD-10-CM

## 2022-04-16 DIAGNOSIS — F1010 Alcohol abuse, uncomplicated: Secondary | ICD-10-CM | POA: Diagnosis present

## 2022-04-16 DIAGNOSIS — E1110 Type 2 diabetes mellitus with ketoacidosis without coma: Secondary | ICD-10-CM | POA: Diagnosis present

## 2022-04-16 DIAGNOSIS — K863 Pseudocyst of pancreas: Secondary | ICD-10-CM | POA: Diagnosis present

## 2022-04-16 DIAGNOSIS — R112 Nausea with vomiting, unspecified: Secondary | ICD-10-CM | POA: Diagnosis not present

## 2022-04-16 DIAGNOSIS — Z8249 Family history of ischemic heart disease and other diseases of the circulatory system: Secondary | ICD-10-CM | POA: Diagnosis not present

## 2022-04-16 DIAGNOSIS — E119 Type 2 diabetes mellitus without complications: Secondary | ICD-10-CM

## 2022-04-16 DIAGNOSIS — Z7984 Long term (current) use of oral hypoglycemic drugs: Secondary | ICD-10-CM

## 2022-04-16 DIAGNOSIS — Z720 Tobacco use: Secondary | ICD-10-CM | POA: Diagnosis present

## 2022-04-16 DIAGNOSIS — K760 Fatty (change of) liver, not elsewhere classified: Secondary | ICD-10-CM | POA: Diagnosis present

## 2022-04-16 DIAGNOSIS — Z794 Long term (current) use of insulin: Secondary | ICD-10-CM | POA: Diagnosis not present

## 2022-04-16 DIAGNOSIS — R195 Other fecal abnormalities: Secondary | ICD-10-CM | POA: Diagnosis present

## 2022-04-16 DIAGNOSIS — K860 Alcohol-induced chronic pancreatitis: Secondary | ICD-10-CM | POA: Diagnosis not present

## 2022-04-16 DIAGNOSIS — R7989 Other specified abnormal findings of blood chemistry: Secondary | ICD-10-CM | POA: Diagnosis not present

## 2022-04-16 DIAGNOSIS — Z833 Family history of diabetes mellitus: Secondary | ICD-10-CM | POA: Diagnosis not present

## 2022-04-16 DIAGNOSIS — D500 Iron deficiency anemia secondary to blood loss (chronic): Secondary | ICD-10-CM | POA: Diagnosis present

## 2022-04-16 DIAGNOSIS — E1169 Type 2 diabetes mellitus with other specified complication: Secondary | ICD-10-CM

## 2022-04-16 DIAGNOSIS — D649 Anemia, unspecified: Secondary | ICD-10-CM | POA: Diagnosis not present

## 2022-04-16 DIAGNOSIS — R739 Hyperglycemia, unspecified: Secondary | ICD-10-CM | POA: Diagnosis not present

## 2022-04-16 DIAGNOSIS — K861 Other chronic pancreatitis: Secondary | ICD-10-CM | POA: Diagnosis present

## 2022-04-16 DIAGNOSIS — K3189 Other diseases of stomach and duodenum: Secondary | ICD-10-CM | POA: Diagnosis present

## 2022-04-16 DIAGNOSIS — E876 Hypokalemia: Secondary | ICD-10-CM | POA: Diagnosis present

## 2022-04-16 DIAGNOSIS — Z7985 Long-term (current) use of injectable non-insulin antidiabetic drugs: Secondary | ICD-10-CM

## 2022-04-16 DIAGNOSIS — K5090 Crohn's disease, unspecified, without complications: Secondary | ICD-10-CM | POA: Diagnosis present

## 2022-04-16 DIAGNOSIS — K219 Gastro-esophageal reflux disease without esophagitis: Secondary | ICD-10-CM | POA: Diagnosis present

## 2022-04-16 DIAGNOSIS — I10 Essential (primary) hypertension: Secondary | ICD-10-CM | POA: Diagnosis present

## 2022-04-16 DIAGNOSIS — Z79899 Other long term (current) drug therapy: Secondary | ICD-10-CM

## 2022-04-16 DIAGNOSIS — F17210 Nicotine dependence, cigarettes, uncomplicated: Secondary | ICD-10-CM | POA: Diagnosis present

## 2022-04-16 DIAGNOSIS — Z981 Arthrodesis status: Secondary | ICD-10-CM | POA: Diagnosis not present

## 2022-04-16 DIAGNOSIS — Z91148 Patient's other noncompliance with medication regimen for other reason: Secondary | ICD-10-CM

## 2022-04-16 LAB — CBC
HCT: 21.7 % — ABNORMAL LOW (ref 39.0–52.0)
Hematocrit: 24.7 % — ABNORMAL LOW (ref 37.5–51.0)
Hemoglobin: 8.5 g/dL — ABNORMAL LOW (ref 13.0–17.7)
Hemoglobin: 7.5 g/dL — ABNORMAL LOW (ref 13.0–17.0)
MCH: 32.4 pg (ref 26.6–33.0)
MCH: 32.6 pg (ref 26.0–34.0)
MCHC: 34.4 g/dL (ref 31.5–35.7)
MCHC: 34.6 g/dL (ref 30.0–36.0)
MCV: 94 fL (ref 79–97)
MCV: 94.3 fL (ref 80.0–100.0)
Platelets: 80 10*3/uL — CL (ref 150–450)
Platelets: 82 10*3/uL — ABNORMAL LOW (ref 150–400)
RBC: 2.62 x10E6/uL — CL (ref 4.14–5.80)
RBC: 2.3 MIL/uL — ABNORMAL LOW (ref 4.22–5.81)
RDW: 11.3 % — ABNORMAL LOW (ref 11.6–15.4)
RDW: 11.7 % (ref 11.5–15.5)
WBC: 3.2 10*3/uL — ABNORMAL LOW (ref 3.4–10.8)
WBC: 2.5 10*3/uL — ABNORMAL LOW (ref 4.0–10.5)
nRBC: 0 % (ref 0.0–0.2)

## 2022-04-16 LAB — CBC WITH DIFFERENTIAL/PLATELET
Abs Immature Granulocytes: 0.01 10*3/uL (ref 0.00–0.07)
Basophils Absolute: 0 10*3/uL (ref 0.0–0.1)
Basophils Relative: 1 %
Eosinophils Absolute: 0 10*3/uL (ref 0.0–0.5)
Eosinophils Relative: 1 %
HCT: 26 % — ABNORMAL LOW (ref 39.0–52.0)
Hemoglobin: 9.1 g/dL — ABNORMAL LOW (ref 13.0–17.0)
Immature Granulocytes: 0 %
Lymphocytes Relative: 26 %
Lymphs Abs: 0.7 10*3/uL (ref 0.7–4.0)
MCH: 32.5 pg (ref 26.0–34.0)
MCHC: 35 g/dL (ref 30.0–36.0)
MCV: 92.9 fL (ref 80.0–100.0)
Monocytes Absolute: 0.4 10*3/uL (ref 0.1–1.0)
Monocytes Relative: 16 %
Neutro Abs: 1.4 10*3/uL — ABNORMAL LOW (ref 1.7–7.7)
Neutrophils Relative %: 56 %
Platelets: 96 10*3/uL — ABNORMAL LOW (ref 150–400)
RBC: 2.8 MIL/uL — ABNORMAL LOW (ref 4.22–5.81)
RDW: 11.7 % (ref 11.5–15.5)
WBC: 2.5 10*3/uL — ABNORMAL LOW (ref 4.0–10.5)
nRBC: 0 % (ref 0.0–0.2)

## 2022-04-16 LAB — COMPREHENSIVE METABOLIC PANEL
ALT: 68 IU/L — ABNORMAL HIGH (ref 0–44)
ALT: 67 U/L — ABNORMAL HIGH (ref 0–44)
AST: 115 IU/L — ABNORMAL HIGH (ref 0–40)
AST: 76 U/L — ABNORMAL HIGH (ref 15–41)
Albumin/Globulin Ratio: 1.8 (ref 1.2–2.2)
Albumin: 4.1 g/dL (ref 4.1–5.1)
Albumin: 4 g/dL (ref 3.5–5.0)
Alkaline Phosphatase: 672 IU/L — ABNORMAL HIGH (ref 44–121)
Alkaline Phosphatase: 530 U/L — ABNORMAL HIGH (ref 38–126)
Anion gap: 13 (ref 5–15)
BUN/Creatinine Ratio: 7 — ABNORMAL LOW (ref 9–20)
BUN: 6 mg/dL (ref 6–24)
BUN: 7 mg/dL (ref 6–20)
Bilirubin Total: 1.4 mg/dL — ABNORMAL HIGH (ref 0.0–1.2)
CO2: 25 mmol/L (ref 20–29)
CO2: 27 mmol/L (ref 22–32)
Calcium: 8.9 mg/dL (ref 8.7–10.2)
Calcium: 8.5 mg/dL — ABNORMAL LOW (ref 8.9–10.3)
Chloride: 89 mmol/L — ABNORMAL LOW (ref 96–106)
Chloride: 88 mmol/L — ABNORMAL LOW (ref 98–111)
Creatinine, Ser: 0.88 mg/dL (ref 0.76–1.27)
Creatinine, Ser: 0.78 mg/dL (ref 0.61–1.24)
GFR, Estimated: >60 mL/min (ref >60)
Globulin, Total: 2.3 g/dL (ref 1.5–4.5)
Glucose, Bld: 599 mg/dL (ref 70–99)
Glucose: 480 mg/dL — ABNORMAL HIGH (ref 70–99)
Potassium: 3 mmol/L — ABNORMAL LOW (ref 3.5–5.2)
Potassium: 2.7 mmol/L — CL (ref 3.5–5.1)
Sodium: 133 mmol/L — ABNORMAL LOW (ref 134–144)
Sodium: 128 mmol/L — ABNORMAL LOW (ref 135–145)
Total Bilirubin: 1.7 mg/dL — ABNORMAL HIGH (ref 0.3–1.2)
Total Protein: 6.4 g/dL (ref 6.0–8.5)
Total Protein: 6.9 g/dL (ref 6.5–8.1)
eGFR: 106 mL/min/{1.73_m2} (ref >59)

## 2022-04-16 LAB — BASIC METABOLIC PANEL
Anion gap: 10 (ref 5–15)
BUN: 6 mg/dL (ref 6–20)
CO2: 28 mmol/L (ref 22–32)
Calcium: 8.3 mg/dL — ABNORMAL LOW (ref 8.9–10.3)
Chloride: 93 mmol/L — ABNORMAL LOW (ref 98–111)
Creatinine, Ser: 0.59 mg/dL — ABNORMAL LOW (ref 0.61–1.24)
GFR, Estimated: >60 mL/min (ref >60)
Glucose, Bld: 228 mg/dL — ABNORMAL HIGH (ref 70–99)
Potassium: 2.5 mmol/L — CL (ref 3.5–5.1)
Sodium: 131 mmol/L — ABNORMAL LOW (ref 135–145)

## 2022-04-16 LAB — PROTIME-INR
INR: 1.1 (ref 0.8–1.2)
Prothrombin Time: 13.8 seconds (ref 11.4–15.2)

## 2022-04-16 LAB — CBG MONITORING, ED: Glucose-Capillary: 382 mg/dL — ABNORMAL HIGH (ref 70–99)

## 2022-04-16 LAB — TYPE AND SCREEN
ABO/RH(D): O POS
Antibody Screen: NEGATIVE

## 2022-04-16 LAB — TSH: TSH: 3.03 u[IU]/mL (ref 0.450–4.500)

## 2022-04-16 LAB — IRON AND TIBC
Iron: 40 ug/dL — ABNORMAL LOW (ref 45–182)
Saturation Ratios: 19 % (ref 17.9–39.5)
TIBC: 206 ug/dL — ABNORMAL LOW (ref 250–450)
UIBC: 166 ug/dL

## 2022-04-16 LAB — CANCER ANTIGEN 19-9: CA 19-9: 40 U/mL — ABNORMAL HIGH (ref 0–35)

## 2022-04-16 LAB — HEMOGLOBIN AND HEMATOCRIT, BLOOD
HCT: 31.6 % — ABNORMAL LOW (ref 39.0–52.0)
Hemoglobin: 11.4 g/dL — ABNORMAL LOW (ref 13.0–17.0)

## 2022-04-16 LAB — URINALYSIS, ROUTINE W REFLEX MICROSCOPIC
Bilirubin Urine: NEGATIVE
Glucose, UA: >=500 mg/dL — AB
Hgb urine dipstick: NEGATIVE
Ketones, ur: 20 mg/dL — AB
Leukocytes,Ua: NEGATIVE
Nitrite: NEGATIVE
Protein, ur: NEGATIVE mg/dL
Specific Gravity, Urine: >1.046 — ABNORMAL HIGH (ref 1.005–1.030)
pH: 6 (ref 5.0–8.0)

## 2022-04-16 LAB — GLUCOSE, CAPILLARY
Glucose-Capillary: 218 mg/dL — ABNORMAL HIGH (ref 70–99)
Glucose-Capillary: 267 mg/dL — ABNORMAL HIGH (ref 70–99)

## 2022-04-16 LAB — MAGNESIUM: Magnesium: 1.8 mg/dL (ref 1.7–2.4)

## 2022-04-16 LAB — RETICULOCYTES
Immature Retic Fract: 17.6 % — ABNORMAL HIGH (ref 2.3–15.9)
RBC.: 3.09 MIL/uL — ABNORMAL LOW (ref 4.22–5.81)
Retic Count, Absolute: 83.7 10*3/uL (ref 19.0–186.0)
Retic Ct Pct: 2.7 % (ref 0.4–3.1)

## 2022-04-16 LAB — PHOSPHORUS: Phosphorus: 3.3 mg/dL (ref 2.5–4.6)

## 2022-04-16 LAB — FOLATE: Folate: 16.4 ng/mL (ref >5.9)

## 2022-04-16 LAB — HEMOGLOBIN A1C
Hgb A1c MFr Bld: 8.8 % — ABNORMAL HIGH (ref 4.8–5.6)
Mean Plasma Glucose: 205.86 mg/dL

## 2022-04-16 LAB — FERRITIN: Ferritin: 859 ng/mL — ABNORMAL HIGH (ref 24–336)

## 2022-04-16 LAB — VITAMIN B12: Vitamin B-12: 1070 pg/mL — ABNORMAL HIGH (ref 180–914)

## 2022-04-16 LAB — POC OCCULT BLOOD, ED: Fecal Occult Bld: POSITIVE — AB

## 2022-04-16 MED ORDER — PANTOPRAZOLE SODIUM 40 MG IV SOLR
40.0000 mg | Freq: Two times a day (BID) | INTRAVENOUS | Status: DC
Start: 2022-04-16 — End: 2022-04-18
  Administered 2022-04-16 – 2022-04-18 (×4): 40 mg via INTRAVENOUS
  Filled 2022-04-16 (×4): qty 10

## 2022-04-16 MED ORDER — POTASSIUM CHLORIDE CRYS ER 20 MEQ PO TBCR
40.0000 meq | EXTENDED_RELEASE_TABLET | Freq: Once | ORAL | Status: AC
  Administered 2022-04-16: 40 meq via ORAL
  Filled 2022-04-16: qty 2

## 2022-04-16 MED ORDER — DEXTROSE 50 % IV SOLN
0.0000 mL | INTRAVENOUS | Status: DC | PRN
  Administered 2022-04-17 – 2022-04-18 (×2): 50 mL via INTRAVENOUS
  Filled 2022-04-16 (×2): qty 50

## 2022-04-16 MED ORDER — PANCRELIPASE (LIP-PROT-AMYL) 12000-38000 UNITS PO CPEP
72000.0000 [IU] | ORAL_CAPSULE | Freq: Three times a day (TID) | ORAL | Status: DC
  Administered 2022-04-17 – 2022-04-18 (×2): 72000 [IU] via ORAL
  Filled 2022-04-16 (×3): qty 6

## 2022-04-16 MED ORDER — LORAZEPAM 1 MG PO TABS: 1.0000 mg | ORAL_TABLET | ORAL | Status: DC | PRN

## 2022-04-16 MED ORDER — ADULT MULTIVITAMIN W/MINERALS CH
1.0000 | ORAL_TABLET | Freq: Every day | ORAL | Status: DC
  Administered 2022-04-17 – 2022-04-18 (×2): 1 via ORAL
  Filled 2022-04-16 (×2): qty 1

## 2022-04-16 MED ORDER — GABAPENTIN 300 MG PO CAPS
300.0000 mg | ORAL_CAPSULE | Freq: Three times a day (TID) | ORAL | Status: DC
  Administered 2022-04-16 – 2022-04-18 (×5): 300 mg via ORAL
  Filled 2022-04-16 (×5): qty 1

## 2022-04-16 MED ORDER — POTASSIUM CHLORIDE 10 MEQ/100ML IV SOLN
10.0000 meq | INTRAVENOUS | Status: DC
  Administered 2022-04-16: 10 meq via INTRAVENOUS
  Filled 2022-04-16: qty 100

## 2022-04-16 MED ORDER — LORAZEPAM 2 MG/ML IJ SOLN: 0.0000 mg | Freq: Two times a day (BID) | INTRAMUSCULAR | Status: DC

## 2022-04-16 MED ORDER — IOHEXOL 300 MG/ML  SOLN
100.0000 mL | Freq: Once | INTRAMUSCULAR | Status: AC | PRN
  Administered 2022-04-16: 100 mL via INTRAVENOUS

## 2022-04-16 MED ORDER — DEXTROSE IN LACTATED RINGERS 5 % IV SOLN: INTRAVENOUS | Status: DC

## 2022-04-16 MED ORDER — ONDANSETRON HCL 4 MG/2ML IJ SOLN: 4.0000 mg | Freq: Four times a day (QID) | INTRAMUSCULAR | Status: DC | PRN

## 2022-04-16 MED ORDER — ACETAMINOPHEN 325 MG PO TABS
650.0000 mg | ORAL_TABLET | Freq: Four times a day (QID) | ORAL | Status: DC | PRN
  Administered 2022-04-16: 650 mg via ORAL
  Filled 2022-04-16: qty 2

## 2022-04-16 MED ORDER — FOLIC ACID 1 MG PO TABS
1.0000 mg | ORAL_TABLET | Freq: Every day | ORAL | Status: DC
  Administered 2022-04-17 – 2022-04-18 (×2): 1 mg via ORAL
  Filled 2022-04-16 (×2): qty 1

## 2022-04-16 MED ORDER — LACTATED RINGERS IV BOLUS
1000.0000 mL | Freq: Once | INTRAVENOUS | Status: DC
Start: 2022-04-16 — End: 2022-04-16

## 2022-04-16 MED ORDER — POTASSIUM CHLORIDE 10 MEQ/100ML IV SOLN: 10.0000 meq | INTRAVENOUS | Status: DC

## 2022-04-16 MED ORDER — CITALOPRAM HYDROBROMIDE 20 MG PO TABS
20.0000 mg | ORAL_TABLET | Freq: Every day | ORAL | Status: DC
  Administered 2022-04-17 – 2022-04-18 (×2): 20 mg via ORAL
  Filled 2022-04-16 (×2): qty 1

## 2022-04-16 MED ORDER — PANTOPRAZOLE SODIUM 40 MG IV SOLR
40.0000 mg | Freq: Once | INTRAVENOUS | Status: AC
  Administered 2022-04-16: 40 mg via INTRAVENOUS
  Filled 2022-04-16: qty 10

## 2022-04-16 MED ORDER — INSULIN ASPART 100 UNIT/ML IJ SOLN
6.0000 [IU] | Freq: Once | INTRAMUSCULAR | Status: AC
Start: 2022-04-16 — End: 2022-04-16
  Administered 2022-04-16: 6 [IU] via SUBCUTANEOUS
  Filled 2022-04-16: qty 1

## 2022-04-16 MED ORDER — ENSURE ENLIVE PO LIQD
237.0000 mL | Freq: Two times a day (BID) | ORAL | Status: DC
Start: 2022-04-17 — End: 2022-04-17

## 2022-04-16 MED ORDER — PANCRELIPASE (LIP-PROT-AMYL) 12000-38000 UNITS PO CPEP: 36000.0000 [IU] | ORAL_CAPSULE | Freq: Every day | ORAL | Status: DC | PRN

## 2022-04-16 MED ORDER — AMLODIPINE BESYLATE 5 MG PO TABS
2.5000 mg | ORAL_TABLET | Freq: Every day | ORAL | Status: DC
  Administered 2022-04-17 – 2022-04-18 (×2): 2.5 mg via ORAL
  Filled 2022-04-16 (×2): qty 1

## 2022-04-16 MED ORDER — LACTATED RINGERS IV BOLUS
500.0000 mL | Freq: Once | INTRAVENOUS | Status: AC
  Administered 2022-04-16: 500 mL via INTRAVENOUS

## 2022-04-16 MED ORDER — LACTATED RINGERS IV SOLN: INTRAVENOUS | Status: AC

## 2022-04-16 MED ORDER — LACTATED RINGERS IV SOLN
INTRAVENOUS | Status: DC
Start: 2022-04-16 — End: 2022-04-16

## 2022-04-16 MED ORDER — ONDANSETRON HCL 4 MG PO TABS: 4.0000 mg | ORAL_TABLET | Freq: Four times a day (QID) | ORAL | Status: DC | PRN

## 2022-04-16 MED ORDER — INSULIN ASPART 100 UNIT/ML IJ SOLN
0.0000 [IU] | INTRAMUSCULAR | Status: DC
  Administered 2022-04-16: 8 [IU] via SUBCUTANEOUS
  Administered 2022-04-16: 5 [IU] via SUBCUTANEOUS
  Administered 2022-04-17: 15 [IU] via SUBCUTANEOUS
  Administered 2022-04-17 (×2): 2 [IU] via SUBCUTANEOUS
  Administered 2022-04-18: 15 [IU] via SUBCUTANEOUS

## 2022-04-16 MED ORDER — BENAZEPRIL HCL 5 MG PO TABS
10.0000 mg | ORAL_TABLET | Freq: Every day | ORAL | Status: DC
  Administered 2022-04-17 – 2022-04-18 (×2): 10 mg via ORAL
  Filled 2022-04-16 (×2): qty 2

## 2022-04-16 MED ORDER — SODIUM CHLORIDE 0.9 % IV BOLUS
1000.0000 mL | Freq: Once | INTRAVENOUS | Status: AC
Start: 2022-04-16 — End: 2022-04-16
  Administered 2022-04-16: 1000 mL via INTRAVENOUS

## 2022-04-16 MED ORDER — INSULIN REGULAR(HUMAN) IN NACL 100-0.9 UT/100ML-% IV SOLN: INTRAVENOUS | Status: DC

## 2022-04-16 MED ORDER — CHLORHEXIDINE GLUCONATE CLOTH 2 % EX PADS
6.0000 | MEDICATED_PAD | Freq: Every day | CUTANEOUS | Status: DC
  Administered 2022-04-16 – 2022-04-18 (×3): 6 via TOPICAL

## 2022-04-16 MED ORDER — THIAMINE HCL 100 MG/ML IJ SOLN
100.0000 mg | Freq: Every day | INTRAMUSCULAR | Status: DC
  Filled 2022-04-16: qty 2

## 2022-04-16 MED ORDER — ACETAMINOPHEN 650 MG RE SUPP: 650.0000 mg | Freq: Four times a day (QID) | RECTAL | Status: DC | PRN

## 2022-04-16 MED ORDER — POLYETHYLENE GLYCOL 3350 17 G PO PACK: 17.0000 g | PACK | Freq: Every day | ORAL | Status: DC | PRN

## 2022-04-16 MED ORDER — POTASSIUM CHLORIDE 10 MEQ/100ML IV SOLN
10.0000 meq | INTRAVENOUS | Status: AC
  Administered 2022-04-16 (×4): 10 meq via INTRAVENOUS
  Filled 2022-04-16 (×4): qty 100

## 2022-04-16 MED ORDER — LORAZEPAM 2 MG/ML IJ SOLN: 1.0000 mg | INTRAMUSCULAR | Status: DC | PRN

## 2022-04-16 MED ORDER — LORAZEPAM 2 MG/ML IJ SOLN: 0.0000 mg | Freq: Four times a day (QID) | INTRAMUSCULAR | Status: DC

## 2022-04-16 MED ORDER — NICOTINE 14 MG/24HR TD PT24
14.0000 mg | MEDICATED_PATCH | Freq: Every day | TRANSDERMAL | Status: DC
Start: 2022-04-16 — End: 2022-04-18
  Administered 2022-04-16 – 2022-04-18 (×3): 14 mg via TRANSDERMAL
  Filled 2022-04-16 (×3): qty 1

## 2022-04-16 MED ORDER — THIAMINE HCL 100 MG PO TABS
100.0000 mg | ORAL_TABLET | Freq: Every day | ORAL | Status: DC
  Administered 2022-04-17 – 2022-04-18 (×2): 100 mg via ORAL
  Filled 2022-04-16 (×2): qty 1

## 2022-04-16 NOTE — H&P (Signed)
History and Physical    Todd Rodriguez XIP:[id number] DOB: [birth year] DOA: 04/16/2022  PCP: Burnis Medin, MD   Patient coming from: Home  I have personally briefly reviewed patient's old medical records in [place]  Chief Complaint: Dizziness, abnormal labs  HPI: Todd Rodriguez is a 46 y.o. male with medical history significant for hypertension, diabetes mellitus, pancreatitis. Patient presented to the ED reports of dizziness over the past few days, he went to see his gastroenterologist Dr. Abbey Chatters, and had blood work done was told to come to the ED. Per triage notes, patient reported heart racing, difficulty breathing-he denies this to me.  He tells me he has had dizziness over the past few days, he is unsteady on his feet and ambulating with a walker.  He has chronic diarrhea on average 10 times a day, sometimes uncontrollably. Spouse reports he vomited about 3 times x 2 days ago, denies any bloody vomitus.  He denies abdominal pain.  No blood in stools or black stools.   Drinks on the average 10-12 beers every day, with half 1/5 of vodka.  Last drink was yesterday at dinner.  ED Course: Stable vitals.  Potassium 2.7.  Blood glucose 599.  Hemoglobin down to 9.1.  Review of Systems: As per HPI all other systems reviewed and negative.  Past Medical History:  Diagnosis Date   Acute pancreatitis 10/25/2013   Arthritis    Diabetes mellitus without complication (HCC)    GERD (gastroesophageal reflux disease)    Hepatic steatosis    History of pancreatitis    Hypertension    Myalgia and myositis 04/21/2014   much better from illness presumed infectious    has disability form  will complete     Pancreatic pseudocyst     Past Surgical History:  Procedure Laterality Date   ANTERIOR CERVICAL DECOMP/DISCECTOMY FUSION N/A 01/12/2018   Procedure: Revision ACDF C4-5, removal of hardware C5-6, exploration of fusion C5-6;  Surgeon: [name], MD;  Location: [facility];  Service:  Orthopedics;  Laterality: N/A;  3.5 hrs   BIOPSY  05/13/2020   Procedure: BIOPSY;  Surgeon: Eloise Harman, DO;  [facility];  Service: Endoscopy;;   BIOPSY  09/23/2020   Procedure: BIOPSY;  Surgeon: Eloise Harman, DO;  [facility];  Service: Endoscopy;;   COLONOSCOPY WITH PROPOFOL N/A 05/13/2020   Dr. Abbey Chatters: Nonbleeding internal hemorrhoids.  Few small mouth diverticula in the sigmoid colon.  2 cecal polyps, inflammatory.  Sigmoid colon polyp hyperplastic.  Localized area of granular mucosa in the transverse colon, status post biopsy which was unremarkable.  No microscopic colitis.  Plans for repeat colonoscopy in 5 years.   ESOPHAGEAL BRUSHING  05/13/2020   Procedure: ESOPHAGEAL BRUSHING;  Surgeon: Eloise Harman, DO;  [facility];  Service: Endoscopy;;  mid esophagus   ESOPHAGOGASTRODUODENOSCOPY (EGD) WITH PROPOFOL N/A 05/13/2020   Dr. Abbey Chatters: Moderately severe esophagitis with no bleeding found in the middle third of the esophagus, suspicious for Candida esophagitis.  KOH was negative. Gastritis, duodenal polyp.  Duodenal biopsy benign.  Duodenal bulb biopsy with Brunner's gland hyperplasia and mild changes suggestive of peptic injury.  No celiac disease seen.  GE junction biopsy showed GERD with mild inflammation consisten   ESOPHAGOGASTRODUODENOSCOPY (EGD) WITH PROPOFOL N/A 09/23/2020   Procedure: ESOPHAGOGASTRODUODENOSCOPY (EGD) WITH PROPOFOL;  Surgeon: Eloise Harman, DO;  [facility];  Service: Endoscopy;  Laterality: N/A;  7:30am   NECK SURGERY  2 disc   POLYPECTOMY  05/13/2020   Procedure: POLYPECTOMY;  Surgeon: Eloise Harman, DO;  [facility];  Service: Endoscopy;;   SPINAL FUSION       reports that he has been smoking cigarettes. He has a 12.00 pack-year smoking history. He has been exposed to tobacco smoke. He has never used smokeless tobacco. He reports that he does not currently use alcohol. He reports that he  does not use drugs.  No Known Allergies  Family History  Problem Relation Age of Onset   Diabetes Mother    Hypertension Mother    Diabetes Father    Hypertension Father    Arthritis Maternal Grandmother    Hypertension Maternal Grandfather    Diabetes Maternal Grandfather    Colon cancer Neg Hx    Pancreatitis Neg Hx    Pancreatic cancer Neg Hx     Prior to Admission medications   Medication Sig Start Date End Date Taking? Authorizing Provider  albuterol (VENTOLIN HFA) 108 (90 Base) MCG/ACT inhaler Inhale 2 puffs into the lungs every 4 (four) hours as needed for wheezing or shortness of breath. 07/03/21  Yes Voncille Simm, Courage, MD  amlodipine-benazepril (LOTREL) 2.5-10 MG capsule Take 1 capsule by mouth daily. For high blood pressure .Dose adjustment 07/03/21  Yes Kassadie Pancake, Courage, MD  atorvastatin (LIPITOR) 40 MG tablet Take 1 tablet (40 mg total) by mouth daily. 07/03/21  Yes Tonye Tancredi, Courage, MD  Brimonidine Tartrate (LUMIFY) 0.025 % SOLN Place 1 drop into both eyes daily as needed (eye irritation/dry eyes.).   Yes [provider]  citalopram (CELEXA) 20 MG tablet Take 10 mg per day for 1-2 weeks then increase to 20 mg per day or as directed 07/03/21  Yes Zanobia Griebel, Courage, MD  empagliflozin (JARDIANCE) 25 MG TABS tablet Take 1 tablet (25 mg total) by mouth daily. 04/15/22 05/15/22 Yes Panosh, Standley Brooking, MD  folic acid (FOLVITE) 1 MG tablet Take 1 tablet (1 mg total) by mouth daily. 07/04/21  Yes Nosson Wender, Courage, MD  gabapentin (NEURONTIN) 300 MG capsule Take 1 capsule (300 mg total) by mouth 3 (three) times daily. 07/03/21  Yes Holiday Mcmenamin, Courage, MD  lipase/protease/amylase (CREON) 36000 UNITS CPEP capsule Take 1-2 capsules (36,000-72,000 Units total) by mouth See admin instructions. Take two with meals and one with snacks (total of 8 per day) 07/03/21  Yes Burnette Sautter, Courage, MD  LORazepam (ATIVAN) 1 MG tablet Take 1 tablet (1 mg total) by mouth every 8 (eight) hours as needed for  anxiety. Panic attacks 05/24/18  Yes Panosh, Standley Brooking, MD  Multiple Vitamin (MULTIVITAMIN WITH MINERALS) TABS tablet Take 1 tablet by mouth daily. 07/04/21  Yes Zackari Ruane, Courage, MD  pantoprazole (PROTONIX) 40 MG tablet TAKE 1 TABLET BY MOUTH EVERY DAY 12/17/21  Yes Hurshel Keys K, DO  thiamine 100 MG tablet Take 1 tablet (100 mg total) by mouth daily. 07/04/21  Yes Gao Mitnick, Courage, MD  TRESIBA FLEXTOUCH 100 UNIT/ML FlexTouch Pen Inject 18 Units into the skin daily. 07/03/21  Yes Jezebel Pollet, Courage, MD  Continuous Blood Gluc Sensor (DEXCOM G6 SENSOR) MISC 1 Device by Does not apply route as directed. 07/03/21   Roxan Hockey, MD  Continuous Blood Gluc Transmit (DEXCOM G6 TRANSMITTER) MISC 1 Device by Does not apply route as directed. 07/03/21   Roxan Hockey, MD  Cyanocobalamin (VITAMIN B-12 PO) Take 1 tablet by mouth daily.    [provider]  dicyclomine (BENTYL) 10 MG capsule TAKE 1 CAPSULE FOUR TIMES A DAY BEFORE MEALS  AND AT BEDTIME AS NEEDED FOR ABDOMINAL CRAMPING Patient not taking: Reported on 04/16/2022 01/30/21   Erenest Rasher, PA-C  glucose blood (ONE TOUCH TEST STRIPS) test strip Use as instructed 07/03/21   Roxan Hockey, MD  insulin aspart (NOVOLOG FLEXPEN) 100 UNIT/ML FlexPen Inject 6 Units into the skin 3 (three) times daily with meals. Patient not taking: Reported on 04/16/2022 07/03/21   Roxan Hockey, MD  Insulin Pen Needle 32G X 4 MM MISC 1 Device by Does not apply route in the morning, at noon, in the evening, and at bedtime. 07/03/21   Roxan Hockey, MD  ondansetron (ZOFRAN ODT) 4 MG disintegrating tablet Take 1 tablet (4 mg total) by mouth every 8 (eight) hours as needed for nausea or vomiting. Patient not taking: Reported on 04/16/2022 01/12/18   Valinda Hoar, [place]    Physical Exam: Vitals:   04/16/22 1600 04/16/22 1615 04/16/22 1630 04/16/22 1700  BP: 120/80  125/85 124/80  Pulse: 69 64 65 64  Resp: 14 16 16 16   Temp:      TempSrc:      SpO2:  98% 100% 100% 100%  Weight:      Height:        Constitutional: NAD, calm, comfortable Vitals:   04/16/22 1600 04/16/22 1615 04/16/22 1630 04/16/22 1700  BP: 120/80  125/85 124/80  Pulse: 69 64 65 64  Resp: 14 16 16 16   Temp:      TempSrc:      SpO2: 98% 100% 100% 100%  Weight:      Height:       Eyes: PERRL, lids and conjunctivae normal ENMT: Mucous membranes are moist. Posterior pharynx clear of any exudate or lesions.Normal dentition.  Neck: normal, supple, no masses, no thyromegaly Respiratory: clear to auscultation bilaterally, no wheezing, no crackles. Normal respiratory effort. No accessory muscle use.  Cardiovascular: Regular rate and rhythm, no murmurs / rubs / gallops. No extremity edema. 2+ pedal pulses. No carotid bruits.  Abdomen: no tenderness, no masses palpated. No hepatosplenomegaly. Bowel sounds positive.  Musculoskeletal: no clubbing / cyanosis. No joint deformity upper and lower extremities. Good ROM, no contractures. Normal muscle tone.  Skin: no rashes, lesions, ulcers. No induration Neurologic: CN 2-12 grossly intact. Sensation intact, DTR normal. Strength 5/5 in all 4.  Psychiatric: Normal judgment and insight. Alert and oriented x 3. Normal mood.   Labs on Admission: I have personally reviewed following labs and imaging studies  CBC: Recent Labs  Lab 04/15/22 1023 04/16/22 1202  WBC 3.2* 2.5*  NEUTROABS  --  1.4*  HGB 8.5* 9.1*  HCT 24.7* 26.0*  MCV 94 92.9  PLT 80* 96*   Basic Metabolic Panel: Recent Labs  Lab 04/15/22 1023 04/16/22 1202  NA 133* 128*  K 3.0* 2.7*  CL 89* 88*  CO2 25 27  GLUCOSE 480* 599*  BUN 6 7  CREATININE 0.88 0.78  CALCIUM 8.9 8.5*  MG  --  1.8   GFR: Estimated Creatinine Clearance: 91.4 mL/min (by C-G formula based on SCr of 0.78 mg/dL). Liver Function Tests: Recent Labs  Lab 04/15/22 1023 04/16/22 1202  AST 115* 76*  ALT 68* 67*  ALKPHOS 672* 530*  BILITOT 1.4* 1.7*  PROT 6.4 6.9  ALBUMIN 4.1 4.0    Coagulation Profile: Recent Labs  Lab 04/16/22 1202  INR 1.1   CBG: Recent Labs  Lab 04/16/22 1517  GLUCAP 382*   Thyroid Function Tests: Recent Labs    04/15/22 1023  TSH 3.030   Urine analysis:    Component Value Date/Time   COLORURINE STRAW (A) 06/30/2021 2115   APPEARANCEUR CLEAR 06/30/2021 2115   LABSPEC 1.017 06/30/2021 2115   PHURINE 5.0 06/30/2021 2115   GLUCOSEU >=500 (A) 06/30/2021 2115   HGBUR MODERATE (A) 06/30/2021 2115   BILIRUBINUR NEGATIVE 06/30/2021 2115   BILIRUBINUR n 01/01/2015 1112   KETONESUR 80 (A) 06/30/2021 2115   PROTEINUR 30 (A) 06/30/2021 2115   UROBILINOGEN 0.2 01/01/2015 1112   UROBILINOGEN 0.2 10/25/2013 1541   NITRITE NEGATIVE 06/30/2021 2115   LEUKOCYTESUR NEGATIVE 06/30/2021 2115    Radiological Exams on Admission: CT Abdomen Pelvis W Contrast  Result Date: 04/16/2022 CLINICAL DATA:  Increased bowel movements about 10 per day over past month, no control of bowel movements, acute abdominal pain, history pancreatitis, diabetes mellitus, hypertension, GERD EXAM: CT ABDOMEN AND PELVIS WITH CONTRAST TECHNIQUE: Multidetector CT imaging of the abdomen and pelvis was performed using the standard protocol following bolus administration of intravenous contrast. RADIATION DOSE REDUCTION: This exam was performed according to the departmental dose-optimization program which includes automated exposure control, adjustment of the mA and/or kV according to patient size and/or use of iterative reconstruction technique. CONTRAST:  162m OMNIPAQUE IOHEXOL 300 MG/ML SOLN IV. No oral contrast. COMPARISON:  05/28/2019 FINDINGS: Lower chest: Lung bases clear Hepatobiliary: Marked fatty infiltration of liver. No focal hepatic abnormality. Gallbladder normal appearance. No biliary dilatation. Pancreas: Marked pancreatic atrophy with ductal dilatation and numerous calcifications consistent with chronic calcific pancreatitis. Fluid collection identified associated  with the pancreatic tail and medial margin of the spleen, collection measuring 5.5 x 4.7 x 4.9 cm consistent with pseudo cyst; previously this measured 6.3 x 5.1 x 5.6 cm. Spleen: Remainder of spleen unremarkable.  Small adjacent splenule. Adrenals/Urinary Tract: Adrenal glands, kidneys, ureters, and bladder normal appearance Stomach/Bowel: Normal appendix. Stomach decompressed with poor assessment of proximal gastric wall thickness. Few prominent areas of enhancement at the lower rectum/anus question hemorrhoids. Large and small bowel loops otherwise unremarkable. Vascular/Lymphatic: Atherosclerotic calcifications aorta and iliac arteries without aneurysm. No adenopathy. Reproductive: Unremarkable prostate gland and seminal vesicles Other: Scattered soft tissue edema in abdomen. No free air or discrete ascites. No hernia. Musculoskeletal: Question small enchondroma at intertrochanteric region LEFT femur. No acute osseous findings. 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 Marked fatty infiltration of liver. Few prominent areas of enhancement at the lower rectum/anus question hemorrhoids. Aortic Atherosclerosis (ICD10-I70.0). Electronically Signed   By: MLavonia DanaM.D.   On: 04/16/2022 16:23   DG Chest Port 1 View  Result Date: 04/16/2022 CLINICAL DATA:  Shortness of breath. EXAM: PORTABLE CHEST 1 VIEW COMPARISON:  06/30/2021 and prior radiographs. FINDINGS: The cardiomediastinal silhouette is unremarkable. There is no evidence of focal airspace disease, pulmonary edema, suspicious pulmonary nodule/mass, pleural effusion, or pneumothorax. No acute bony abnormalities are identified. IMPRESSION: No active disease. Electronically Signed   By: JMargarette CanadaM.D.   On: 04/16/2022 12:26    EKG: Independently reviewed.  Artifacts present.  But shows sinus rhythm rate 65, QTc  442.  Assessment/Plan Principal Problem:   Acute anemia Active Problems:   Diabetes mellitus type 2, controlled, without complications (HCC)   Essential hypertension, benign   Abnormal LFTs   Alcohol abuse   Hyperglycemia   Tobacco abuse   Hypokalemia   Assessment and Plan: * Acute anemia Hemoglobin of 9, last  check was 9 months ago baseline 14-16.  Takes Goody powders about 6 times a week on average, for chronic neck pain for which he has had cervical spine fusion.  He denies black stools or blood in stools, denies vomiting of blood.  Positive stool occult in ED. -Trend hemoglobin -IV Protonix 40 twice daily  Hypokalemia Potassium- 2.5.  Magnesium 1.8. -Replete K  Tobacco abuse Smokes a pack of cigarettes daily. - Nicotine patch  Hyperglycemia Blood glucose of 599.  Not in DKA.  Anion gap of 13, serum bicarb of 27.  Reports noncompliance with his insulins Tresiba 18 units daily due to feeling unwell. -Repeat BMP shows blood sugars of 218, after 1 L bolus and 6 units of NovoLog -DC insulin drip - 1.5  L bolus given, continue LR 100 cc/h x 10hrs - SSI- M Q4h -A1c -N.p.o. midnight, hence will hold Tresiba 18 units daily for now -Hold Jardiance   Alcohol abuse Drinks every day.  Baseline drinks 10-12 beers every day and 1/2 of a 5th of vodka.  Last drink was yesterday evening with dinner -about 24 hours ago.  History of withdrawal with shakes and diaphoresis, but no seizure history.   -CIWA as needed and scheduled -Check Phos, mag 1.8 -Thiamine folate multivitamins  Abnormal LFTs AST 76, ALT 67, ALP 530.  Total bilirubin 1.7.  Likely related to marked fatty liver infiltration seen on CT, and chronic alcohol abuse. -Hepatitis panel in a.m. -Hold statins  Essential hypertension, benign Resume amlodipine benazepril.   DVT prophylaxis: SCDs Code Status: Full code Family Communication: Spouse Melanie and brother-in-law at bedside. Disposition Plan: ~ 2 days Consults  called: GI Admission status: Inpt stepdown I certify that at the point of admission it is my clinical judgment that the patient will require inpatient hospital care spanning beyond 2 midnights from the point of admission due to high intensity of service, high risk for further deterioration and high frequency of surveillance required.    Author: Bethena Roys, MD 04/16/2022 7:38 PM  For on call review [email or web address].

## 2022-04-16 NOTE — Assessment & Plan Note (Addendum)
Potassium- 2.5.  Magnesium 1.8. -Repleted K

## 2022-04-16 NOTE — ED Provider Notes (Signed)
[facility] EMERGENCY DEPARTMENT Provider Note   CSN: [id number] Arrival date & time: 04/16/22  1125     History  Chief Complaint  Patient presents with   Palpitations    Todd Rodriguez is a 46 y.o. male.  He has a history of hypertension diabetes, Crohns.  He said he has had diarrhea for month+ associated with weight loss.  Nonbloody.  No abdominal pain.  No fevers or chills.  He has also been more lightheaded and short of breath especially with exertion.  Feels sometimes unsteady when he is walking.  He saw Dr. Abbey Chatters from GI yesterday who drew some lab work and called him and told him he needed to come to the emergency department.  Labs showing new anemia.  Patient endorses continued alcohol use although less, he also uses Goody powders 1 daily.  Has not seen any bleeding.  Not on blood thinners.  Progressive weakness and difficulty ambulating  The history is provided by the patient.  Diarrhea Quality:  Watery Severity:  Severe Onset quality:  Gradual Number of episodes:  5 per day Duration:  1 month Timing:  Intermittent Progression:  Unchanged Relieved by:  None tried Worsened by:  Nothing Ineffective treatments:  None tried Associated symptoms: no abdominal pain, no recent cough, no diaphoresis, no fever and no vomiting   Risk factors: no recent antibiotic use        Home Medications Prior to Admission medications   Medication Sig Start Date End Date Taking? Authorizing Provider  albuterol (VENTOLIN HFA) 108 (90 Base) MCG/ACT inhaler Inhale 2 puffs into the lungs every 4 (four) hours as needed for wheezing or shortness of breath. 07/03/21   Emokpae, Courage, MD  amlodipine-benazepril (LOTREL) 2.5-10 MG capsule Take 1 capsule by mouth daily. For high blood pressure .Dose adjustment 07/03/21   Roxan Hockey, MD  atorvastatin (LIPITOR) 40 MG tablet Take 1 tablet (40 mg total) by mouth daily. 07/03/21   Roxan Hockey, MD  Brimonidine Tartrate (LUMIFY) 0.025 % SOLN Place  1 drop into both eyes daily as needed (eye irritation/dry eyes.).    [provider]  citalopram (CELEXA) 20 MG tablet Take 10 mg per day for 1-2 weeks then increase to 20 mg per day or as directed 07/03/21   Roxan Hockey, MD  Continuous Blood Gluc Sensor (DEXCOM G6 SENSOR) MISC 1 Device by Does not apply route as directed. 07/03/21   Roxan Hockey, MD  Continuous Blood Gluc Transmit (DEXCOM G6 TRANSMITTER) MISC 1 Device by Does not apply route as directed. 07/03/21   Roxan Hockey, MD  Cyanocobalamin (VITAMIN B-12 PO) Take 1 tablet by mouth daily.    [provider]  dicyclomine (BENTYL) 10 MG capsule TAKE 1 CAPSULE FOUR TIMES A DAY BEFORE MEALS AND AT BEDTIME AS NEEDED FOR ABDOMINAL CRAMPING 01/30/21   Erenest Rasher, PA-C  empagliflozin (JARDIANCE) 25 MG TABS tablet Take 1 tablet (25 mg total) by mouth daily. 04/15/22 05/15/22  Panosh, Standley Brooking, MD  folic acid (FOLVITE) 1 MG tablet Take 1 tablet (1 mg total) by mouth daily. 07/04/21   Roxan Hockey, MD  gabapentin (NEURONTIN) 300 MG capsule Take 1 capsule (300 mg total) by mouth 3 (three) times daily. 07/03/21   Roxan Hockey, MD  glucose blood (ONE TOUCH TEST STRIPS) test strip Use as instructed 07/03/21   Roxan Hockey, MD  insulin aspart (NOVOLOG FLEXPEN) 100 UNIT/ML FlexPen Inject 6 Units into the skin 3 (three) times daily with meals. 07/03/21   Emokpae, Courage,  MD  Insulin Pen Needle 32G X 4 MM MISC 1 Device by Does not apply route in the morning, at noon, in the evening, and at bedtime. 07/03/21   Roxan Hockey, MD  lipase/protease/amylase (CREON) 36000 UNITS CPEP capsule Take 1-2 capsules (36,000-72,000 Units total) by mouth See admin instructions. Take two with meals and one with snacks (total of 8 per day) 07/03/21   Roxan Hockey, MD  LORazepam (ATIVAN) 1 MG tablet Take 1 tablet (1 mg total) by mouth every 8 (eight) hours as needed for anxiety. Panic attacks 05/24/18   Panosh, Standley Brooking, MD  magnesium oxide  (MAG-OX) 400 (240 Mg) MG tablet Take 1 tablet by mouth daily. Patient not taking: Reported on 04/15/2022 04/23/21   [provider]  Multiple Vitamin (MULTIVITAMIN WITH MINERALS) TABS tablet Take 1 tablet by mouth daily. 07/04/21   Roxan Hockey, MD  ondansetron (ZOFRAN ODT) 4 MG disintegrating tablet Take 1 tablet (4 mg total) by mouth every 8 (eight) hours as needed for nausea or vomiting. 01/12/18   Mayo, Darla Lesches, PA-C  pantoprazole (PROTONIX) 40 MG tablet TAKE 1 TABLET BY MOUTH EVERY DAY 12/17/21   Eloise Harman, DO  thiamine 100 MG tablet Take 1 tablet (100 mg total) by mouth daily. 07/04/21   Roxan Hockey, MD  TRESIBA FLEXTOUCH 100 UNIT/ML FlexTouch Pen Inject 18 Units into the skin daily. 07/03/21   Roxan Hockey, MD      Allergies    Patient has no known allergies.    Review of Systems   Review of Systems  Constitutional:  Positive for fatigue. Negative for diaphoresis and fever.  HENT:  Negative for sore throat.   Respiratory:  Positive for shortness of breath.   Cardiovascular:  Positive for palpitations. Negative for chest pain.  Gastrointestinal:  Positive for diarrhea. Negative for abdominal pain and vomiting.  Musculoskeletal:  Positive for gait problem.  Neurological:  Positive for light-headedness.    Physical Exam Updated Vital Signs BP 115/81   Pulse 82   Temp 98.4 F (36.9 C) (Oral)   Resp 19   Ht 6' 2"  (1.88 m)   Wt 57.2 kg   SpO2 100%   BMI 16.18 kg/m  Physical Exam Vitals and nursing note reviewed.  Constitutional:      General: He is not in acute distress.    Appearance: Normal appearance. He is well-developed.  HENT:     Head: Normocephalic and atraumatic.  Eyes:     Conjunctiva/sclera: Conjunctivae normal.  Cardiovascular:     Rate and Rhythm: Normal rate and regular rhythm.     Pulses: Normal pulses.     Heart sounds: No murmur heard. Pulmonary:     Effort: Pulmonary effort is normal. No respiratory distress.      Breath sounds: Normal breath sounds.  Abdominal:     Palpations: Abdomen is soft.     Tenderness: There is no abdominal tenderness. There is no guarding or rebound.  Musculoskeletal:     Cervical back: Neck supple.     Right lower leg: No edema.     Left lower leg: No edema.  Skin:    General: Skin is warm and dry.     Capillary Refill: Capillary refill takes less than 2 seconds.  Neurological:     General: No focal deficit present.     Mental Status: He is alert.     ED Results / Procedures / Treatments   Labs (all labs ordered are listed, but only abnormal  results are displayed) Labs Reviewed  COMPREHENSIVE METABOLIC PANEL - Abnormal; Notable for the following components:      Result Value   Sodium 128 (*)    Potassium 2.7 (*)    Chloride 88 (*)    Glucose, Bld 599 (*)    Calcium 8.5 (*)    AST 76 (*)    ALT 67 (*)    Alkaline Phosphatase 530 (*)    Total Bilirubin 1.7 (*)    All other components within normal limits  CBC WITH DIFFERENTIAL/PLATELET - Abnormal; Notable for the following components:   WBC 2.5 (*)    RBC 2.80 (*)    Hemoglobin 9.1 (*)    HCT 26.0 (*)    Platelets 96 (*)    Neutro Abs 1.4 (*)    All other components within normal limits  URINALYSIS, ROUTINE W REFLEX MICROSCOPIC - Abnormal; Notable for the following components:   Specific Gravity, Urine >1.046 (*)    Glucose, UA >=500 (*)    Ketones, ur 20 (*)    Bacteria, UA RARE (*)    All other components within normal limits  RETICULOCYTES - Abnormal; Notable for the following components:   RBC. 3.09 (*)    Immature Retic Fract 17.6 (*)    All other components within normal limits  BASIC METABOLIC PANEL - Abnormal; Notable for the following components:   Sodium 131 (*)    Potassium 2.5 (*)    Chloride 93 (*)    Glucose, Bld 228 (*)    Creatinine, Ser 0.59 (*)    Calcium 8.3 (*)    All other components within normal limits  GLUCOSE, CAPILLARY - Abnormal; Notable for the following components:    Glucose-Capillary 218 (*)    All other components within normal limits  POC OCCULT BLOOD, ED - Abnormal; Notable for the following components:   Fecal Occult Bld POSITIVE (*)    All other components within normal limits  CBG MONITORING, ED - Abnormal; Notable for the following components:   Glucose-Capillary 382 (*)    All other components within normal limits  MRSA NEXT GEN BY PCR, NASAL  PROTIME-INR  MAGNESIUM  VITAMIN B12  FOLATE  IRON AND TIBC  FERRITIN  BASIC METABOLIC PANEL  BASIC METABOLIC PANEL  HEMOGLOBIN A1C  CBC  HEMOGLOBIN AND HEMATOCRIT, BLOOD  PHOSPHORUS  TYPE AND SCREEN    EKG EKG Interpretation  Date/Time:  Thursday April 16 2022 12:52:18 EDT Ventricular Rate:  65 PR Interval:  36 QRS Duration: 85 QT Interval:  425 QTC Calculation: 442 R Axis:   66 Text Interpretation: Sinus rhythm Atrial premature complex Short PR interval Low voltage, extremity leads Probable anteroseptal infarct, old No significant change since prior 8/20 Confirmed by Aletta Edouard [phone]) on 04/16/2022 12:54:35 PM  Radiology CT Abdomen Pelvis W Contrast  Result Date: 04/16/2022 CLINICAL DATA:  Increased bowel movements about 10 per day over past month, no control of bowel movements, acute abdominal pain, history pancreatitis, diabetes mellitus, hypertension, GERD EXAM: CT ABDOMEN AND PELVIS WITH CONTRAST TECHNIQUE: Multidetector CT imaging of the abdomen and pelvis was performed using the standard protocol following bolus administration of intravenous contrast. RADIATION DOSE REDUCTION: This exam was performed according to the departmental dose-optimization program which includes automated exposure control, adjustment of the mA and/or kV according to patient size and/or use of iterative reconstruction technique. CONTRAST:  194m OMNIPAQUE IOHEXOL 300 MG/ML SOLN IV. No oral contrast. COMPARISON:  05/28/2019 FINDINGS: Lower chest: Lung bases clear Hepatobiliary: Marked  fatty infiltration of  liver. No focal hepatic abnormality. Gallbladder normal appearance. No biliary dilatation. Pancreas: Marked pancreatic atrophy with ductal dilatation and numerous calcifications consistent with chronic calcific pancreatitis. Fluid collection identified associated with the pancreatic tail and medial margin of the spleen, collection measuring 5.5 x 4.7 x 4.9 cm consistent with pseudo cyst; previously this measured 6.3 x 5.1 x 5.6 cm. Spleen: Remainder of spleen unremarkable.  Small adjacent splenule. Adrenals/Urinary Tract: Adrenal glands, kidneys, ureters, and bladder normal appearance Stomach/Bowel: Normal appendix. Stomach decompressed with poor assessment of proximal gastric wall thickness. Few prominent areas of enhancement at the lower rectum/anus question hemorrhoids. Large and small bowel loops otherwise unremarkable. Vascular/Lymphatic: Atherosclerotic calcifications aorta and iliac arteries without aneurysm. No adenopathy. Reproductive: Unremarkable prostate gland and seminal vesicles Other: Scattered soft tissue edema in abdomen. No free air or discrete ascites. No hernia. Musculoskeletal: Question small enchondroma at intertrochanteric region LEFT femur. No acute osseous findings. 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 Marked fatty infiltration of liver. Few prominent areas of enhancement at the lower rectum/anus question hemorrhoids. Aortic Atherosclerosis (ICD10-I70.0). Electronically Signed   By: Lavonia Dana M.D.   On: 04/16/2022 16:23   DG Chest Port 1 View  Result Date: 04/16/2022 CLINICAL DATA:  Shortness of breath. EXAM: PORTABLE CHEST 1 VIEW COMPARISON:  06/30/2021 and prior radiographs. FINDINGS: The cardiomediastinal silhouette is unremarkable. There is no evidence of focal airspace disease, pulmonary edema, suspicious pulmonary nodule/mass, pleural  effusion, or pneumothorax. No acute bony abnormalities are identified. IMPRESSION: No active disease. Electronically Signed   By: Margarette Canada M.D.   On: 04/16/2022 12:26    Procedures Procedures    Medications Ordered in ED Medications  insulin regular, human (MYXREDLIN) 100 units/ 100 mL infusion (has no administration in time range)  lactated ringers infusion ( Intravenous New Bag/Given 04/16/22 1902)  dextrose 50 % solution 0-50 mL (has no administration in time range)  potassium chloride 10 mEq in 100 mL IVPB (10 mEq Intravenous New Bag/Given 04/16/22 1903)  nicotine (NICODERM CQ - dosed in mg/24 hours) patch 14 mg (has no administration in time range)  acetaminophen (TYLENOL) tablet 650 mg (has no administration in time range)    Or  acetaminophen (TYLENOL) suppository 650 mg (has no administration in time range)  ondansetron (ZOFRAN) tablet 4 mg (has no administration in time range)    Or  ondansetron (ZOFRAN) injection 4 mg (has no administration in time range)  polyethylene glycol (MIRALAX / GLYCOLAX) packet 17 g (has no administration in time range)  LORazepam (ATIVAN) tablet 1-4 mg (has no administration in time range)    Or  LORazepam (ATIVAN) injection 1-4 mg (has no administration in time range)  thiamine tablet 100 mg (has no administration in time range)    Or  thiamine (B-1) injection 100 mg (has no administration in time range)  folic acid (FOLVITE) tablet 1 mg (has no administration in time range)  multivitamin with minerals tablet 1 tablet (has no administration in time range)  LORazepam (ATIVAN) injection 0-4 mg (has no administration in time range)    Followed by  LORazepam (ATIVAN) injection 0-4 mg (has no administration in time range)  lactated ringers bolus 500 mL (has no administration in time range)  insulin aspart (novoLOG) injection 0-15 Units (has no administration in time range)  pantoprazole (PROTONIX) injection 40 mg (40 mg Intravenous Given  04/16/22  1226)  sodium chloride 0.9 % bolus 1,000 mL (0 mLs Intravenous Stopped 04/16/22 1424)  potassium chloride SA (KLOR-CON M) CR tablet 40 mEq (40 mEq Oral Given 04/16/22 1316)  insulin aspart (novoLOG) injection 6 Units (6 Units Subcutaneous Given 04/16/22 1315)  iohexol (OMNIPAQUE) 300 MG/ML solution 100 mL (100 mLs Intravenous Contrast Given 04/16/22 1550)    ED Course/ Medical Decision Making/ A&P Clinical Course as of 04/16/22 1921  Thu Apr 16, 2022  1412 Rectal exam done with nurse Ronalee Belts as chaperone.  Normal tone no masses.  Sample sent to lab for guaiac. [MB]    Clinical Course User Index [MB] Hayden Rasmussen, MD                           Medical Decision Making Amount and/or Complexity of Data Reviewed Labs: ordered. Radiology: ordered.  Risk Prescription drug management. Decision regarding hospitalization.  This patient complains of weakness fatigue shortness of breath palpitations weight loss diarrhea; this involves an extensive number of treatment Options and is a complaint that carries with it a high risk of complications and morbidity. The differential includes anemia, GI bleed, metabolic derangement, dehydration, infection, malignancy  I ordered, reviewed and interpreted labs, which included CBC with low white count low hemoglobin and low platelets, chemistries with low sodium low potassium elevated glucose normal gap, elevated LFTs, urinalysis without signs of infection, fecal occult positive I ordered medication IV fluids IV PPI oral potassium subcu insulin and reviewed PMP when indicated. I ordered imaging studies which included chest x-ray and CT abdomen and pelvis and I independently    visualized and interpreted imaging which showed pancreatitis Additional history obtained from patient's companion Previous records obtained and reviewed in epic including GI visit yesterday  Cardiac monitoring reviewed, normal sinus rhythm Social determinants considered, ongoing  tobacco and alcohol use Critical Interventions: None  After the interventions stated above, I reevaluated the patient and found patient to be fairly asymptomatic at rest Admission and further testing considered, he will need admission to the hospital for further management.  His care is signed out to Dr. Regenia Skeeter to follow-up on final reading of CT and discussion with GI and hospitalist regarding admission.          Final Clinical Impression(s) / ED Diagnoses Final diagnoses:  Symptomatic anemia  Hypokalemia  Hyperglycemia    Rx / DC Orders ED Discharge Orders     None         Hayden Rasmussen, MD 04/16/22 1924

## 2022-04-16 NOTE — Assessment & Plan Note (Addendum)
Drinks every day.  Baseline drinks 10-12 beers every day and 1/2 of a 5th of vodka.   Last drink was 7/12 evening with dinner -about 24 hours ago.  History of withdrawal with shakes and diaphoresis, but no seizure history.   -CIWA as needed and scheduled -Check Phos 3.3, mag 1.8 -Thiamine, folate, multivitamins - cessation advised

## 2022-04-16 NOTE — ED Provider Notes (Signed)
Care transferred to me.  Patient's CT is overall unremarkable compared to baseline.  Discussed this case with GI, Dr. Abbey Chatters, who knows him.  He agrees with Protonix which has already been given as well as admission.  Will need glucose, potassium treated and he will do an EGD tomorrow.  Clear liquids until midnight.  Discussed with hospitalist, Dr. Denton Brick who will admit.   Sherwood Gambler, MD 04/17/22 [phone]

## 2022-04-16 NOTE — Assessment & Plan Note (Addendum)
Resume amlodipine and benazepril.

## 2022-04-16 NOTE — Assessment & Plan Note (Addendum)
Blood glucose of 599.  Not in DKA.  Anion gap of 13, serum bicarb of 27.  Reports noncompliance with his insulins Tresiba 18 units daily due to feeling unwell. -Repeat BMP shows blood sugars of 218, after 1 L bolus and 6 units of NovoLog -DC insulin drip - 1.5  L bolus given, continue LR 100 cc/h x 10hrs - SSI- M Q4h -A1c -N.p.o. midnight, hence will hold Tresiba 18 units daily for now -[employer]

## 2022-04-16 NOTE — ED Notes (Signed)
Patient transported to CT 

## 2022-04-16 NOTE — ED Triage Notes (Signed)
Pt states she feels like his heart has been racing and feeling some sob  Pt states he was seen at his pcp and had lab work and was told to come to ED

## 2022-04-16 NOTE — ED Notes (Signed)
CT called for update on patient status.

## 2022-04-16 NOTE — Assessment & Plan Note (Addendum)
AST 76, ALT 67, ALP 530.  Total bilirubin 1.7.   related to marked fatty liver infiltration seen on CT, and chronic alcohol abuse. -Hold statins>>resume after d/c -LFTs trending down

## 2022-04-16 NOTE — Plan of Care (Signed)

## 2022-04-16 NOTE — Assessment & Plan Note (Addendum)
In part due to chronic alcohol use Serum Q33 7445 Folic acid 14.6 TSH 0.479 -alcohol cessation advised

## 2022-04-16 NOTE — Assessment & Plan Note (Addendum)
Smokes a pack of cigarettes daily. - Nicotine patch -Tobacco cessation discussed

## 2022-04-17 ENCOUNTER — Other Ambulatory Visit: Payer: Self-pay

## 2022-04-17 ENCOUNTER — Encounter (HOSPITAL_COMMUNITY): Payer: Self-pay | Admitting: Internal Medicine

## 2022-04-17 ENCOUNTER — Inpatient Hospital Stay (HOSPITAL_COMMUNITY): Payer: Medicare Other | Admitting: Anesthesiology

## 2022-04-17 ENCOUNTER — Telehealth: Payer: Self-pay | Admitting: Gastroenterology

## 2022-04-17 ENCOUNTER — Encounter (HOSPITAL_COMMUNITY)
Admission: EM | Disposition: A | Discharge status: Home / Self Care | Payer: Self-pay | Source: Home / Self Care | Attending: Internal Medicine

## 2022-04-17 DIAGNOSIS — E1169 Type 2 diabetes mellitus with other specified complication: Secondary | ICD-10-CM

## 2022-04-17 DIAGNOSIS — E1165 Type 2 diabetes mellitus with hyperglycemia: Secondary | ICD-10-CM

## 2022-04-17 DIAGNOSIS — I10 Essential (primary) hypertension: Secondary | ICD-10-CM | POA: Diagnosis not present

## 2022-04-17 DIAGNOSIS — R112 Nausea with vomiting, unspecified: Secondary | ICD-10-CM

## 2022-04-17 DIAGNOSIS — D500 Iron deficiency anemia secondary to blood loss (chronic): Secondary | ICD-10-CM | POA: Diagnosis not present

## 2022-04-17 DIAGNOSIS — F17210 Nicotine dependence, cigarettes, uncomplicated: Secondary | ICD-10-CM | POA: Diagnosis not present

## 2022-04-17 DIAGNOSIS — R195 Other fecal abnormalities: Secondary | ICD-10-CM

## 2022-04-17 DIAGNOSIS — E1110 Type 2 diabetes mellitus with ketoacidosis without coma: Secondary | ICD-10-CM

## 2022-04-17 DIAGNOSIS — F1010 Alcohol abuse, uncomplicated: Secondary | ICD-10-CM | POA: Diagnosis not present

## 2022-04-17 DIAGNOSIS — R7989 Other specified abnormal findings of blood chemistry: Secondary | ICD-10-CM | POA: Diagnosis not present

## 2022-04-17 HISTORY — PX: ESOPHAGOGASTRODUODENOSCOPY (EGD) WITH PROPOFOL: SHX5813

## 2022-04-17 LAB — BASIC METABOLIC PANEL
Anion gap: 5 (ref 5–15)
BUN: <5 mg/dL — ABNORMAL LOW (ref 6–20)
CO2: 30 mmol/L (ref 22–32)
Calcium: 8.3 mg/dL — ABNORMAL LOW (ref 8.9–10.3)
Chloride: 100 mmol/L (ref 98–111)
Creatinine, Ser: 0.46 mg/dL — ABNORMAL LOW (ref 0.61–1.24)
GFR, Estimated: >60 mL/min (ref >60)
Glucose, Bld: 145 mg/dL — ABNORMAL HIGH (ref 70–99)
Potassium: 4.1 mmol/L (ref 3.5–5.1)
Sodium: 135 mmol/L (ref 135–145)

## 2022-04-17 LAB — COMPREHENSIVE METABOLIC PANEL
ALT: 45 U/L — ABNORMAL HIGH (ref 0–44)
AST: 52 U/L — ABNORMAL HIGH (ref 15–41)
Albumin: 2.9 g/dL — ABNORMAL LOW (ref 3.5–5.0)
Alkaline Phosphatase: 359 U/L — ABNORMAL HIGH (ref 38–126)
Anion gap: 6 (ref 5–15)
BUN: <5 mg/dL — ABNORMAL LOW (ref 6–20)
CO2: 28 mmol/L (ref 22–32)
Calcium: 8.1 mg/dL — ABNORMAL LOW (ref 8.9–10.3)
Chloride: 102 mmol/L (ref 98–111)
Creatinine, Ser: 0.48 mg/dL — ABNORMAL LOW (ref 0.61–1.24)
GFR, Estimated: >60 mL/min (ref >60)
Glucose, Bld: 50 mg/dL — ABNORMAL LOW (ref 70–99)
Potassium: 2.8 mmol/L — ABNORMAL LOW (ref 3.5–5.1)
Sodium: 136 mmol/L (ref 135–145)
Total Bilirubin: 0.8 mg/dL (ref 0.3–1.2)
Total Protein: 5.2 g/dL — ABNORMAL LOW (ref 6.5–8.1)

## 2022-04-17 LAB — CBC
HCT: 22.5 % — ABNORMAL LOW (ref 39.0–52.0)
Hemoglobin: 7.8 g/dL — ABNORMAL LOW (ref 13.0–17.0)
MCH: 32.8 pg (ref 26.0–34.0)
MCHC: 34.7 g/dL (ref 30.0–36.0)
MCV: 94.5 fL (ref 80.0–100.0)
Platelets: 85 10*3/uL — ABNORMAL LOW (ref 150–400)
RBC: 2.38 MIL/uL — ABNORMAL LOW (ref 4.22–5.81)
RDW: 11.7 % (ref 11.5–15.5)
WBC: 2.1 10*3/uL — ABNORMAL LOW (ref 4.0–10.5)
nRBC: 0 % (ref 0.0–0.2)

## 2022-04-17 LAB — HEPATITIS PANEL, ACUTE
HCV Ab: NONREACTIVE
Hep A IgM: NONREACTIVE
Hep B C IgM: NONREACTIVE
Hepatitis B Surface Ag: NONREACTIVE

## 2022-04-17 LAB — GLUCOSE, CAPILLARY
Glucose-Capillary: 52 mg/dL — ABNORMAL LOW (ref 70–99)
Glucose-Capillary: 133 mg/dL — ABNORMAL HIGH (ref 70–99)
Glucose-Capillary: 148 mg/dL — ABNORMAL HIGH (ref 70–99)
Glucose-Capillary: 132 mg/dL — ABNORMAL HIGH (ref 70–99)
Glucose-Capillary: 122 mg/dL — ABNORMAL HIGH (ref 70–99)
Glucose-Capillary: 96 mg/dL (ref 70–99)
Glucose-Capillary: 558 mg/dL (ref 70–99)

## 2022-04-17 LAB — MRSA NEXT GEN BY PCR, NASAL: MRSA by PCR Next Gen: NOT DETECTED

## 2022-04-17 LAB — GLUCOSE, RANDOM: Glucose, Bld: 538 mg/dL (ref 70–99)

## 2022-04-17 SURGERY — ESOPHAGOGASTRODUODENOSCOPY (EGD) WITH PROPOFOL
Anesthesia: General

## 2022-04-17 MED ORDER — PHENYLEPHRINE 80 MCG/ML (10ML) SYRINGE FOR IV PUSH (FOR BLOOD PRESSURE SUPPORT)
PREFILLED_SYRINGE | INTRAVENOUS | Status: AC
  Filled 2022-04-17: qty 10

## 2022-04-17 MED ORDER — SODIUM CHLORIDE 0.9 % IV SOLN: INTRAVENOUS | Status: DC

## 2022-04-17 MED ORDER — POTASSIUM CHLORIDE CRYS ER 20 MEQ PO TBCR
40.0000 meq | EXTENDED_RELEASE_TABLET | Freq: Once | ORAL | Status: AC
  Administered 2022-04-17: 40 meq via ORAL
  Filled 2022-04-17: qty 2

## 2022-04-17 MED ORDER — LACTATED RINGERS IV SOLN: INTRAVENOUS | Status: DC | PRN

## 2022-04-17 MED ORDER — STERILE WATER FOR IRRIGATION IR SOLN
Status: DC | PRN
  Administered 2022-04-17: 60 mL

## 2022-04-17 MED ORDER — ENSURE ENLIVE PO LIQD
237.0000 mL | Freq: Three times a day (TID) | ORAL | Status: DC
Start: 2022-04-17 — End: 2022-04-18
  Administered 2022-04-17 – 2022-04-18 (×2): 237 mL via ORAL
  Filled 2022-04-17: qty 237

## 2022-04-17 MED ORDER — INSULIN ASPART 100 UNIT/ML IJ SOLN
20.0000 [IU] | Freq: Once | INTRAMUSCULAR | Status: AC
Start: 2022-04-17 — End: 2022-04-17
  Administered 2022-04-17: 20 [IU] via SUBCUTANEOUS

## 2022-04-17 MED ORDER — POTASSIUM CHLORIDE 10 MEQ/100ML IV SOLN
10.0000 meq | INTRAVENOUS | Status: AC
  Administered 2022-04-17 (×2): 10 meq via INTRAVENOUS
  Filled 2022-04-17 (×2): qty 100

## 2022-04-17 MED ORDER — PHENYLEPHRINE 80 MCG/ML (10ML) SYRINGE FOR IV PUSH (FOR BLOOD PRESSURE SUPPORT)
PREFILLED_SYRINGE | INTRAVENOUS | Status: DC | PRN
  Administered 2022-04-17: 80 ug via INTRAVENOUS

## 2022-04-17 MED ORDER — PROPOFOL 10 MG/ML IV BOLUS
INTRAVENOUS | Status: DC | PRN
  Administered 2022-04-17: 150 mg via INTRAVENOUS
  Administered 2022-04-17 (×2): 50 mg via INTRAVENOUS

## 2022-04-17 MED ORDER — LIDOCAINE HCL (CARDIAC) PF 100 MG/5ML IV SOSY
PREFILLED_SYRINGE | INTRAVENOUS | Status: DC | PRN
  Administered 2022-04-17: 50 mg via INTRAVENOUS

## 2022-04-17 MED ORDER — BOOST / RESOURCE BREEZE PO LIQD CUSTOM
1.0000 | Freq: Two times a day (BID) | ORAL | Status: DC
  Administered 2022-04-17: 1 via ORAL
  Filled 2022-04-17: qty 1

## 2022-04-17 NOTE — Op Note (Signed)
[facility] Patient Name: Todd Rodriguez Procedure Date: 04/17/2022 2:53 PM MRN: [id number] Date of Birth: 04-Sep-1976 Attending MD: Norvel Richards , MD CSN: [id number] Age: 46 Admit Type: Inpatient Procedure:                Upper GI endoscopy Indications:              Iron deficiency anemia secondary to chronic blood                            loss, Heme positive stool, Nausea with vomiting Providers:                Norvel Richards, MD, Caprice Kluver, Raphael Gibney, Technician Referring MD:              Medicines:                Propofol per Anesthesia Complications:            No immediate complications. Estimated Blood Loss:     Estimated blood loss: none. Procedure:                Pre-Anesthesia Assessment:                           - Prior to the procedure, a History and Physical                            was performed, and patient medications and                            allergies were reviewed. The patient's tolerance of                            previous anesthesia was also reviewed. The risks                            and benefits of the procedure and the sedation                            options and risks were discussed with the patient.                            All questions were answered, and informed consent                            was obtained. Prior Anticoagulants: The patient has                            taken no previous anticoagulant or antiplatelet                            agents. ASA Grade Assessment: III - A patient with  severe systemic disease. After reviewing the risks                            and benefits, the patient was deemed in                            satisfactory condition to undergo the procedure.                           After obtaining informed consent, the endoscope was                            passed under direct vision. Throughout the                             procedure, the patient's blood pressure, pulse, and                            oxygen saturations were monitored continuously. The                            [id number]-[id number] ([id number]) scope was introduced through the                            mouth, and advanced to the second part of duodenum.                            The upper GI endoscopy was accomplished without                            difficulty. The patient tolerated the procedure                            well. Scope In: 3:06:53 PM Scope Out: 3:11:50 PM Total Procedure Duration: 0 hours 4 minutes 57 seconds  Findings:      The examined esophagus was normal.      The duodenal bulb and second portion of the duodenum were normal. Impression:               - Normal esophagus; undulating Z-line. No                            esophagitis. Stomach empty. Scattered gastric                            petechiae. No ulcer or infiltrating process. Patent                            pylorus.                           - Normal duodenal bulb and second portion of the                            duodenum.                           -  No specimens collected.                           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 The following parameters were monitored: oxygen       saturation, heart rate, blood pressure, respiratory rate, EKG, adequacy       of pulmonary ventilation, and response to care. Recommendation:           - Return patient to hospital ward for ongoing care.                           - Advance diet as tolerated. Continue PPI.,                            Pancreatic enzymes. Discussed at length findings                            and recommendations with Threasa Beards patient's  wife at                            [phone]. Cessation of aspirin and alcohol will                            be critical in impacting long-term prognosis.                           -Will arrange early interval follow-up with Dr.                            Abbey Chatters in a couple of weeks. Procedure Code(s):        --- Professional ---                           [phone], Esophagogastroduodenoscopy, flexible,                            transoral; diagnostic, including collection of                            specimen(s) by brushing or washing, when performed                            (separate procedure) Diagnosis Code(s):        --- Professional ---                           D50.0, Iron deficiency anemia secondary to blood  loss (chronic)                           R19.5, Other fecal abnormalities                           R11.2, Nausea with vomiting, unspecified CPT copyright 2019 [employer] Medical [employer]. All rights reserved. The codes documented in this report are preliminary and upon coder review may  be revised to meet current compliance requirements. Todd Rodriguez. Candee Hoon, MD Norvel Richards, MD 04/17/2022 3:26:24 PM This report has been signed electronically. Number of Addenda: 0

## 2022-04-17 NOTE — Assessment & Plan Note (Addendum)
04/16/2022 hemoglobin A1c 8.8 Patient endorses poor compliance with Todd Rodriguez in anticipation for procedure today>>restarted after procedure NovoLog sliding scale Diet advanced which he tolerated

## 2022-04-17 NOTE — Assessment & Plan Note (Signed)
Patient has chronic blood loss anemia/symptomatic anemia Hemoglobin 14.3 on 07/01/2021 Presented with hemoglobin 8.5 Patient endorses taking Goody powders GI consult appreciated Iron saturation 19%, ferritin 712, R24 7998, folic acid 00.1 11/07/9357 EGD--moderately severe esophagitis with no bleeding found in the middle third of the esophagus, suspicious for Candida esophagitis. KOH was negative. He was treated with 2 weeks of Diflucan 05/13/2020 colonoscopy--2 cecal polyps removed, inflammatory, 1 sigmoid polyp removed, hyperplastic, granular mucosa in the transverse colon biopsy negative. 04/17/22 EGD--normal esophagus;  Scattered gastric petechiae--findings consistent with Etoh/NSAID gastropathy Continue PPT after d/c

## 2022-04-17 NOTE — Progress Notes (Addendum)
PROGRESS NOTE  Todd Rodriguez DHR:[id number] DOB: [birth year] DOA: 04/16/2022 PCP: Burnis Medin, MD  Brief History:  46 year old male with a history of diabetes mellitus type 2, hypertension, alcohol abuse, tobacco abuse, GERD presenting with abnormal labs.  In speaking with the patient he has been complaining of some mild dyspnea on exertion as well as heart racing sensation with minimal exertion.  He states that this has been going on for couple months.  He is a difficult historian.  He does endorse some generalized weakness and dizziness that has worsened over the past 2 weeks.  He denies any fevers, chills, chest pain, cough, hemoptysis, hematochezia, melena.  He states that he has been having 10-15 loose bowel movements on a daily basis for the past 2 to 3 months.  He describes it as toothpaste like consistency.  He denies any new medications.  He denies any abdominal pain, nausea, vomiting, hematemesis.  He went to see GI, Dr. Abbey Chatters on 04/15/2022.  Routine blood work showed his hemoglobin was 8.5 on 04/15/2022. In the ED, the patient was afebrile and hemodynamically stable.  BMP showed sodium 136, potassium 2.8, serum creatinine 0.48.  AST 115, ALT 60, alk phosphatase 672, total bilirubin 1.7.  WBC 2.5, hemoglobin 7.5, platelets 85,000.  The patient was started on IV pantoprazole.  GI was consulted to assist with management.    Assessment and Plan: * Heme positive stool Patient has chronic blood loss anemia Hemoglobin 14.3 on 07/01/2021 Presented with hemoglobin 8.5 Patient endorses taking Goody powders GI consult Iron saturation 19%, ferritin 468, E32 1224, folic acid 82.5 0/0/3704 EGD--moderately severe esophagitis with no bleeding found in the middle third of the esophagus, suspicious for Candida esophagitis.  KOH was negative.  He was treated with 2 weeks of Diflucan 05/13/2020 colonoscopy--2 cecal polyps removed, inflammatory, 1 sigmoid polyp removed, hyperplastic, granular  mucosa in the transverse colon biopsy negative.  Pancytopenia ([place]) In part due to chronic alcohol use Serum U88 9169 Folic acid 45.0 TSH 3.888 -IV Protonix 40 twice daily  DKA, type 2 ([place]) -patient started on IV insulin in ED -Presented with serum glucose 599, anion gap 13, ketonuria -pt started on aggressive fluid resuscitation -Electrolytes were monitored and repleted -transitioned to [place] insulin  -diet was advanced once anion gap closed -04/16/2022 HbA1C--8.8   Uncontrolled type 2 diabetes mellitus with hyperglycemia, with long-term current use of insulin ([place]) 04/16/2022 hemoglobin A1c 8.8 Patient endorses poor compliance with [employer] in anticipation for procedure today NovoLog sliding scale  Hypokalemia Potassium- 2.5.  Magnesium 1.8. -Replete K  Tobacco abuse Smokes a pack of cigarettes daily. - Nicotine patch -Tobacco cessation discussed  Loose stools Likely pancreatic insufficiency Continue creaon  Alcohol abuse Drinks every day.  Baseline drinks 10-12 beers every day and 1/2 of a 5th of vodka.   Last drink was 7/12 evening with dinner -about 24 hours ago.  History of withdrawal with shakes and diaphoresis, but no seizure history.   -CIWA as needed and scheduled -Check Phos 3.3, mag 1.8 -Thiamine, folate, multivitamins  Abnormal LFTs AST 76, ALT 67, ALP 530.  Total bilirubin 1.7.   related to marked fatty liver infiltration seen on CT, and chronic alcohol abuse. -Hold statins  Essential hypertension, benign Resume amlodipine and benazepril.       Family Communication:   no Family at bedside  Consultants:  GI  Code Status:  FULL   DVT Prophylaxis:  SCDs  Procedures: As Listed in Progress Note Above  Antibiotics: None      Subjective: Patient denies fevers, chills, headache, chest pain, dyspnea, nausea, vomiting, diarrhea, abdominal pain, dysuria, hematuria, hematochezia, and melena.   Objective: Vitals:    04/17/22 0300 04/17/22 0400 04/17/22 0500 04/17/22 0600  BP: 90/66 126/84 (!) 142/87 124/80  Pulse: (!) 58 65 (!) 55 (!) 57  Resp: 12 12 12 12   Temp:      TempSrc:      SpO2: 98% 100% 100% 100%  Weight:  65.8 kg    Height:        Intake/Output Summary (Last 24 hours) at 04/17/2022 0746 Last data filed at 04/17/2022 0400 Gross per 24 hour  Intake 1899.04 ml  Output --  Net 1899.04 ml   Weight change:  Exam:  General:  Pt is alert, follows commands appropriately, not in acute distress HEENT: No icterus, No thrush, No neck mass, [place]/AT Cardiovascular: RRR, S1/S2, no rubs, no gallops Respiratory: CTA bilaterally, no wheezing, no crackles, no rhonchi Abdomen: Soft/+BS, non tender, non distended, no guarding Extremities: No edema, No lymphangitis, No petechiae, No rashes, no synovitis   Data Reviewed: I have personally reviewed following labs and imaging studies Basic Metabolic Panel: Recent Labs  Lab 04/15/22 1023 04/16/22 1202 04/16/22 1814 04/17/22 0402  NA 133* 128* 131* 136  K 3.0* 2.7* 2.5* 2.8*  CL 89* 88* 93* 102  CO2 25 27 28 28   GLUCOSE 480* 599* 228* 50*  BUN 6 7 6  <5*  CREATININE 0.88 0.78 0.59* 0.48*  CALCIUM 8.9 8.5* 8.3* 8.1*  MG  --  1.8  --   --   PHOS  --   --  3.3  --    Liver Function Tests: Recent Labs  Lab 04/15/22 1023 04/16/22 1202 04/17/22 0402  AST 115* 76* 52*  ALT 68* 67* 45*  ALKPHOS 672* 530* 359*  BILITOT 1.4* 1.7* 0.8  PROT 6.4 6.9 5.2*  ALBUMIN 4.1 4.0 2.9*   No results for input(s): "LIPASE", "AMYLASE" in the last 168 hours. No results for input(s): "AMMONIA" in the last 168 hours. Coagulation Profile: Recent Labs  Lab 04/16/22 1202  INR 1.1   CBC: Recent Labs  Lab 04/15/22 1023 04/16/22 1202 04/16/22 1814 04/16/22 2207 04/17/22 0402  WBC 3.2* 2.5*  --  2.5* 2.1*  NEUTROABS  --  1.4*  --   --   --   HGB 8.5* 9.1* 11.4* 7.5* 7.8*  HCT 24.7* 26.0* 31.6* 21.7* 22.5*  MCV 94 92.9  --  94.3 94.5  PLT 80* 96*  --   82* 85*   Cardiac Enzymes: No results for input(s): "CKTOTAL", "CKMB", "CKMBINDEX", "TROPONINI" in the last 168 hours. BNP: Invalid input(s): "POCBNP" CBG: Recent Labs  Lab 04/16/22 1517 04/16/22 1917 04/16/22 2304 04/17/22 0401 04/17/22 0458  GLUCAP 382* 218* 267* 52* 133*   HbA1C: Recent Labs    04/16/22 1814  HGBA1C 8.8*   Urine analysis:    Component Value Date/Time   COLORURINE YELLOW 04/16/2022 1800   APPEARANCEUR CLEAR 04/16/2022 1800   LABSPEC >1.046 (H) 04/16/2022 1800   PHURINE 6.0 04/16/2022 1800   GLUCOSEU >=500 (A) 04/16/2022 1800   HGBUR NEGATIVE 04/16/2022 1800   BILIRUBINUR NEGATIVE 04/16/2022 1800   BILIRUBINUR n 01/01/2015 1112   KETONESUR 20 (A) 04/16/2022 1800   PROTEINUR NEGATIVE 04/16/2022 1800   UROBILINOGEN 0.2 01/01/2015 1112   UROBILINOGEN 0.2 10/25/2013 1541   NITRITE NEGATIVE 04/16/2022 1800   LEUKOCYTESUR  NEGATIVE 04/16/2022 1800   Sepsis Labs: @LABRCNTIP (procalcitonin:4,lacticidven:4) ) Recent Results (from the past 240 hour(s))  MRSA Next Gen by PCR, Nasal     Status: None   Collection Time: 04/16/22  7:34 PM   Specimen: Nasal Mucosa; Nasal Swab  Result Value Ref Range Status   MRSA by PCR Next Gen NOT DETECTED NOT DETECTED Final    Comment: (NOTE) The GeneXpert MRSA Assay (FDA approved for NASAL specimens only), is one component of a comprehensive MRSA colonization surveillance program. It is not intended to diagnose MRSA infection nor to guide or monitor treatment for MRSA infections. Test performance is not FDA approved in patients less than 25 years old. Performed at [facility], [address], [place], [place] [address]      Scheduled Meds:  amLODipine  2.5 mg Oral Daily   And   benazepril  10 mg Oral Daily   Chlorhexidine Gluconate Cloth  6 each Topical Daily   citalopram  20 mg Oral Daily   feeding supplement  237 mL Oral BID BM   folic acid  1 mg Oral Daily   gabapentin  300 mg Oral TID   insulin aspart  0-15  Units Subcutaneous Q4H   lipase/protease/amylase  72,000 Units Oral TID AC   LORazepam  0-4 mg Intravenous Q6H   Followed by   Derrill Memo ON 04/18/2022] LORazepam  0-4 mg Intravenous Q12H   multivitamin with minerals  1 tablet Oral Daily   nicotine  14 mg Transdermal Daily   pantoprazole  40 mg Intravenous Q12H   potassium chloride  40 mEq Oral Once   thiamine  100 mg Oral Daily   Or   thiamine  100 mg Intravenous Daily   Continuous Infusions:  potassium chloride      Procedures/Studies: CT Abdomen Pelvis W Contrast  Result Date: 04/16/2022 CLINICAL DATA:  Increased bowel movements about 10 per day over past month, no control of bowel movements, acute abdominal pain, history pancreatitis, diabetes mellitus, hypertension, GERD EXAM: CT ABDOMEN AND PELVIS WITH CONTRAST TECHNIQUE: Multidetector CT imaging of the abdomen and pelvis was performed using the standard protocol following bolus administration of intravenous contrast. RADIATION DOSE REDUCTION: This exam was performed according to the departmental dose-optimization program which includes automated exposure control, adjustment of the mA and/or kV according to patient size and/or use of iterative reconstruction technique. CONTRAST:  128m OMNIPAQUE IOHEXOL 300 MG/ML SOLN IV. No oral contrast. COMPARISON:  05/28/2019 FINDINGS: Lower chest: Lung bases clear Hepatobiliary: Marked fatty infiltration of liver. No focal hepatic abnormality. Gallbladder normal appearance. No biliary dilatation. Pancreas: Marked pancreatic atrophy with ductal dilatation and numerous calcifications consistent with chronic calcific pancreatitis. Fluid collection identified associated with the pancreatic tail and medial margin of the spleen, collection measuring 5.5 x 4.7 x 4.9 cm consistent with pseudo cyst; previously this measured 6.3 x 5.1 x 5.6 cm. Spleen: Remainder of spleen unremarkable.  Small adjacent splenule. Adrenals/Urinary Tract: Adrenal glands, kidneys,  ureters, and bladder normal appearance Stomach/Bowel: Normal appendix. Stomach decompressed with poor assessment of proximal gastric wall thickness. Few prominent areas of enhancement at the lower rectum/anus question hemorrhoids. Large and small bowel loops otherwise unremarkable. Vascular/Lymphatic: Atherosclerotic calcifications aorta and iliac arteries without aneurysm. No adenopathy. Reproductive: Unremarkable prostate gland and seminal vesicles Other: Scattered soft tissue edema in abdomen. No free air or discrete ascites. No hernia. Musculoskeletal: Question small enchondroma at intertrochanteric region LEFT femur. No acute osseous findings. 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 Marked fatty infiltration of liver. Few prominent areas of enhancement at the lower rectum/anus question hemorrhoids. Aortic Atherosclerosis (ICD10-I70.0). Electronically Signed   By: Lavonia Dana M.D.   On: 04/16/2022 16:23   DG Chest Port 1 View  Result Date: 04/16/2022 CLINICAL DATA:  Shortness of breath. EXAM: PORTABLE CHEST 1 VIEW COMPARISON:  06/30/2021 and prior radiographs. FINDINGS: The cardiomediastinal silhouette is unremarkable. There is no evidence of focal airspace disease, pulmonary edema, suspicious pulmonary nodule/mass, pleural effusion, or pneumothorax. No acute bony abnormalities are identified. IMPRESSION: No active disease. Electronically Signed   By: Margarette Canada M.D.   On: 04/16/2022 12:26    Orson Eva, DO  [facility]  If 7PM-7AM, please contact night-coverage [email or web address] Password [facility] 04/17/2022, 7:46 AM   LOS: 1 day

## 2022-04-17 NOTE — Progress Notes (Signed)
Patient returned to unit from PACU requested something to eat, patient informed regarding full liquid diet order. Patient stated he has not ate anything since last night and he wanted some food. Informed patient regarding the reason patient is on full liquid after procedure. MD Rourk made aware. New order change diet to carb modified.

## 2022-04-17 NOTE — Assessment & Plan Note (Signed)
-  patient started on IV insulin in ED -Presented with serum glucose 599, anion gap 13, ketonuria -pt started on aggressive fluid resuscitation -Electrolytes were monitored and repleted -transitioned to [place] insulin  -diet was advanced once anion gap closed -04/16/2022 HbA1C--8.8

## 2022-04-17 NOTE — Anesthesia Procedure Notes (Signed)
Date/Time: 04/17/2022 3:08 PM  Performed by: Orlie Dakin, CRNAPre-anesthesia Checklist: Patient identified, Emergency Drugs available, Suction available and Patient being monitored Patient Re-evaluated:Patient Re-evaluated prior to induction Oxygen Delivery Method: Nasal cannula Induction Type: IV induction Placement Confirmation: positive ETCO2

## 2022-04-17 NOTE — Transfer of Care (Signed)
Immediate Anesthesia Transfer of Care Note  Patient: Todd Rodriguez  Procedure(s) Performed: ESOPHAGOGASTRODUODENOSCOPY (EGD) WITH PROPOFOL  Patient Location: PACU  Anesthesia Type:General  Level of Consciousness: drowsy  Airway & Oxygen Therapy: Patient Spontanous Breathing and Patient connected to nasal cannula oxygen  Post-op Assessment: Report given to RN and Post -op Vital signs reviewed and stable  Post vital signs: Reviewed and stable  Last Vitals:  Vitals Value Taken Time  BP 165/95 04/17/22 1516  Temp    Pulse 88 04/17/22 1517  Resp 0 04/17/22 1516  SpO2 98 % 04/17/22 1517  Vitals shown include unvalidated device data.  Last Pain:  Vitals:   04/17/22 1501  TempSrc:   PainSc: 0-No pain      Patients Stated Pain Goal: 4 (43/83/81 8403)  Complications: No notable events documented.

## 2022-04-17 NOTE — Assessment & Plan Note (Signed)
Likely pancreatic insufficiency Continue creaon

## 2022-04-17 NOTE — TOC Progression Note (Signed)
Transition of Care [facility]) - Progression Note    Patient Details  Name: Todd Rodriguez MRN: [id number] Date of Birth: [birth year]  Transition of Care [facility]) CM/SW Contact  Boneta Lucks, RN Phone Number: 04/17/2022, 10:27 AM  Clinical Narrative:   Patient admitted heme positive stool. TOC consulted for substance abuse, patient lives at home with his wife. He states he does not need any help or any resources. He is disabled, lost his father and has a lot going on, but his drinking is not a problem and not why he is in the hospital.   Expected Discharge Plan: Home/Self Care Barriers to Discharge: Continued Medical Work up  Expected Discharge Plan and Services Expected Discharge Plan: Home/Self Care      Living arrangements for the past 2 months: Single Family Home                    Readmission Risk Interventions    04/17/2022   10:25 AM  Readmission Risk Prevention Plan  Transportation Screening Complete  PCP or Specialist Appt within 5-7 Days Not Complete  Home Care Screening Complete  Medication Review (RN CM) Complete

## 2022-04-17 NOTE — Progress Notes (Signed)
Initial Nutrition Assessment RD working remotely.  DOCUMENTATION CODES:   Underweight  INTERVENTION:  - increase Ensure Enlive from BID to TID, each supplement provides 350 kcal and 20 grams of protein.  - will order Boost Breeze BID, each supplement provides 250 kcal and 9 grams of protein.  - will order 1 tablet multivitamin with minerals/day.  - complete NFPE when feasible.    NUTRITION DIAGNOSIS:   Inadequate oral intake related to acute illness, diarrhea as evidenced by per patient/family report.  GOAL:   Patient will meet greater than or equal to 90% of their needs  MONITOR:   PO intake, Supplement acceptance, Labs, Weight trends  REASON FOR ASSESSMENT:   Malnutrition Screening Tool  ASSESSMENT:   46 year old male with medical history of type 2 DM, HTN, alcohol abuse, tobacco abuse, GERD, arthritis, hepatic steatosis, and pancreatitis. He presented to the ED due to mild dyspnea on exertion, sensation of his heart racing, generalized weakness, dizziness, and 10-15 loose BMs for the past 2-3 months. He had an appointment with outpatient GI doctor on 7/12 and Hgb on that date was 8.5 so he was directed to the ED. He was admitted due to heme-positive stool.  Patient is out of the room to Endoscopy at this time. Diet advanced from NPO to Heart Healthy/Carb Modified yesterday at [place] and back to NPO at midnight. No intake documented from dinner last night.  Ensure ordered BID and he has accepted all bottles offered to him.   He was seen by a [place] RD on 07/01/21 at which time he met criteria for severe malnutrition related to social/environmental circumstances as evidenced by severe muscle depletion, percent weight loss, moderate fat depletion, mild fat depletion, moderate muscle depletion.  Suspect malnutrition is ongoing.  Weight today is 143 lb and weight on 05/19/21 was 146 lb.     Labs reviewed; CBGs: 52, 133, 148, 132, 122 mg/dl, BUN: <5 mg/dl, creatinine:  0.46 mg/dl, Ca: 8.3 mg/dl.  Medications reviewed;  mg folvite/day, sliding scale novolog, 72000 units creon TID, 40 mg IV protonix BID, 10 mEq IV KCl x2 runs 7/14, 40 mEq Klor-Con x1 dose 7/13 and x1 dose 7/14, 100 mg thiamine/day.      NUTRITION - FOCUSED PHYSICAL EXAM:  RD working on another campus and patient out of the room to Endo.  Diet Order:   Diet Order             Diet bariatric full liquid Room service appropriate? Yes; Fluid consistency: Thin  Diet effective now                   EDUCATION NEEDS:   Not appropriate for education at this time  Skin:  Skin Assessment: Reviewed RN Assessment  Last BM:  7/14 (type 6 x1, unknown amount; type 5 x1, medium amount; type 4 x1, unknown amount)  Height:   Ht Readings from Last 1 Encounters:  04/17/22 6' 2"  (1.88 m)    Weight:   Wt Readings from Last 1 Encounters:  04/17/22 65 kg     BMI:  Body mass index is 18.4 kg/m.  Estimated Nutritional Needs:  Kcal:  2200-2400 kcal Protein:  110-125 grams Fluid:  >/= 3 L/day      Jarome Matin, MS, RD, LDN, CNSC Registered Dietitian II Inpatient Clinical Nutrition RD pager # and on-call/weekend pager # available in [facility]

## 2022-04-17 NOTE — Anesthesia Preprocedure Evaluation (Addendum)
Anesthesia Evaluation  Patient identified by MRN, date of birth, ID band Patient awake    Reviewed: Allergy & Precautions, NPO status , Patient's Chart, lab work & pertinent test results  History of Anesthesia Complications Negative for: history of anesthetic complications  Airway Mallampati: III  TM Distance: >3 FB Neck ROM: Full   Comment: Neck fusion, Neck pain Dental  (+) Dental Advisory Given, Teeth Intact   Pulmonary neg pulmonary ROS, neg sleep apnea, neg COPD, Current SmokerPatient did not abstain from smoking.,    Pulmonary exam normal breath sounds clear to auscultation       Cardiovascular hypertension, Pt. on medications negative cardio ROS Normal cardiovascular exam Rhythm:Regular Rate:Normal     Neuro/Psych PSYCHIATRIC DISORDERS negative neurological ROS     GI/Hepatic Neg liver ROS, GERD  Medicated and Poorly Controlled,(+)       alcohol use,   Endo/Other  negative endocrine ROSdiabetes, Well Controlled, Type 2, Oral Hypoglycemic Agents, Insulin Dependent  Renal/GU negative Renal ROS     Musculoskeletal  (+) Arthritis , Neck sx   Abdominal   Peds  Hematology  (+) Blood dyscrasia, anemia ,   Anesthesia Other Findings Neck pain Hypokalemia, anemia  Reproductive/Obstetrics                            Anesthesia Physical Anesthesia Plan  ASA: 3 and emergent  Anesthesia Plan: General   Post-op Pain Management:    Induction: Intravenous  PONV Risk Score and Plan: TIVA and Propofol infusion  Airway Management Planned: Nasal Cannula and Natural Airway  Additional Equipment:   Intra-op Plan:   Post-operative Plan:   Informed Consent: I have reviewed the patients History and Physical, chart, labs and discussed the procedure including the risks, benefits and alternatives for the proposed anesthesia with the patient or authorized representative who has indicated his/her  understanding and acceptance.     Dental advisory given  Plan Discussed with: CRNA  Anesthesia Plan Comments:        Anesthesia Quick Evaluation

## 2022-04-17 NOTE — Telephone Encounter (Signed)
Todd Rodriguez - Please arrange hospital follow up for this patient with Dr. Abbey Chatters in about 2 weeks.

## 2022-04-17 NOTE — Hospital Course (Signed)
46 year old male with a history of diabetes mellitus type 2, hypertension, alcohol abuse, tobacco abuse, GERD presenting with abnormal labs.  In speaking with the patient he has been complaining of some mild dyspnea on exertion as well as heart racing sensation with minimal exertion.  He states that this has been going on for couple months.  He is a difficult historian.  He does endorse some generalized weakness and dizziness that has worsened over the past 2 weeks.  He denies any fevers, chills, chest pain, cough, hemoptysis, hematochezia, melena.  He states that he has been having 10-15 loose bowel movements on a daily basis for the past 2 to 3 months.  He describes it as toothpaste like consistency.  He denies any new medications.  He denies any abdominal pain, nausea, vomiting, hematemesis.  He went to see GI, Dr. Abbey Chatters on 04/15/2022.  Routine blood work showed his hemoglobin was 8.5 on 04/15/2022. In the ED, the patient was afebrile and hemodynamically stable.  BMP showed sodium 136, potassium 2.8, serum creatinine 0.48.  AST 115, ALT 60, alk phosphatase 672, total bilirubin 1.7.  WBC 2.5, hemoglobin 7.5, platelets 85,000.  The patient was started on IV pantoprazole.  GI was consulted to assist with management.

## 2022-04-17 NOTE — Anesthesia Postprocedure Evaluation (Signed)
Anesthesia Post Note  Patient: Todd Rodriguez  Procedure(s) Performed: ESOPHAGOGASTRODUODENOSCOPY (EGD) WITH PROPOFOL  Patient location during evaluation: PACU Anesthesia Type: General Level of consciousness: awake and alert Pain management: pain level controlled Vital Signs Assessment: post-procedure vital signs reviewed and stable Respiratory status: spontaneous breathing, nonlabored ventilation, respiratory function stable and patient connected to nasal cannula oxygen Cardiovascular status: blood pressure returned to baseline and stable Postop Assessment: no apparent nausea or vomiting Anesthetic complications: no   There were no known notable events for this encounter.   Last Vitals:  Vitals:   04/17/22 1340 04/17/22 1515  BP: 108/75 (!) 165/95  Pulse:  88  Resp: 17 14  Temp:    SpO2: 100%     Last Pain:  Vitals:   04/17/22 1501  TempSrc:   PainSc: 0-No pain                 [name]

## 2022-04-17 NOTE — Consult Note (Signed)
Gastroenterology Consult   Referring Provider: No ref. provider found Primary Care Physician:  Burnis Medin, MD Primary Gastroenterologist:  Dr. Abbey Chatters  Patient ID: Todd Rodriguez; [id number]; 10/14/1975   Admit date: 04/16/2022  LOS: 1 day   Date of Consultation: 04/17/2022  Reason for Consultation: heme positive stool, chronic blood loss anemia  History of Present Illness   Todd Rodriguez is a 46 y.o. year old male with history of chronic GERD, weight loss, rectal bleeding, pancreatic pseudocyst previously monitored with MRIs, last MRI in January 2022 showing no significant change and no further surveillance recommended, hepatic steatosis, chronic alcohol abuse, diabetes, and HTN who presented to the ED with dizziness over the last few days as well as dyspnea on exertion, diarrhea.  Patient stated he was told to present to the ER by Dr. Abbey Chatters due to his recent blood work.  Spouse reported vomiting x3 that occurred 2 days ago.  GI consulted for further evaluation and management of chronic blood loss anemia and heme positive stool.  ED course: Labs -hemoglobin 9.1, MCV 92.9, platelets 96, potassium 2.7, glucose 599, AST 76, ALT 67, alk phos 530, T. bili 1.7, creatinine 0.78, INR 1.1, magnesium 1.8.  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  EGD with moderately severe esophagitis with no bleeding, findings suspicious for Candida esophagitis, KOH negative treated with 2 weeks of Diflucan, GE junction biopsy showed GERD with mild inflammation consistent with reflux, gastritis, benign  duodenal biopsies.  Duodenal polyp biopsy with Brunner's gland hyperplasia mild changes suggestive of peptic injury.  No findings consistent with celiac.  Colonoscopy performed same day with 2 cecal polyps removed (inflammatory), single sigmoid hyperplastic polyp, granular mucosa in the transverse colon with negative biopsy.  Repeat EGD December 2021 with irregular Z-line biopsy negative for Barrett's esophagus.  Ongoing chronic inflammation of the esophagus but improved from prior, gastritis noted again.  He was advised to continue daily PPI  Last office visit 2 days ago 04/15/22 with Dr. Abbey Chatters which she reported GERD was well controlled, rectal bleeding had resolved.  He reported new onset diarrhea for 4 to 6 weeks having 10 loose bowel movements daily.  He denied abdominal pain, melena or hematochezia.  Diarrhea has been primarily postprandial noting significant weight loss.  Stated that he was wheelchair-bound.  He was counseled on alcohol cessation.  Advised to continue pancreatic enzymes.  Was sent for stool studies and lab work including CBC, CMP, CA 19-9, and TSH.  Labs 04-15-2022 with hemoglobin 8.5, MCV 94, platelets 80, glucose 480, creatinine 0.88, AST 115, ALT 68, alk phos 672, T. bili 1.4, CA 19-9 40, TSH 3.030    Patient interview: Patient states diarrhea has been going on for a few months having about 10 bowel movements a day.  Denies any abdominal pain.  He denies any fever or chills recently.  States he continues to take his Creon with meals.  Most of his bowel movements are postprandial.  He does admit to ongoing alcohol use.  With about 7-8 beers a day as well as a few liquor drinks.  He also admits to frequent Goody powder usage as well  as occasional ibuprofen.  He states he uses this due to his chronic neck pain with his prior surgeries.  He states he is taking his PPI at home.  Patient stated that since he has been in the hospital he has not had any more diarrhea.  He states he has  had some nausea and some intermittent vomiting.  He denies any dark contents or blood noted in his vomit.  He denies any dysphagia, lack appetite, early satiety.  He stated that he has been significantly fatigued recently having to use his grandma's wheelchair.  He states he fell at home in his bathroom a few days ago and hit his head.  He also has noted some dyspnea on exertion.  He states he received a phone call from Dr. Ave Filter nurse regarding his lab work recommending him come to the hospital for further evaluation.  Patient has requested to possibly go home after EGD performed.  Spoke to patient's wife outside of patient's room regarding his recent health.  His wife Threasa Beards stated that the patient has not been telling the truth.  She states that she does not feel as though the [place] powders are the problem.  She states that his main problem has been the alcohol use as he would typically drink 10-12 beers a day and will drink a whole fifth of vodka within 2 days.  She states at times he does not eat meals as he is drinking all day.  She states he also sometimes does not eat due to fear of using the bathroom.  Bowel movements have been multiple times a day on most days, always very loose.  She states he is mostly has been incontinent of his stool wearing depends at home. She states that when he does not have any alcohol he does not really have much diarrhea and he has more strength.  She states that the patient has told her previously that he cannot sleep if he does not drink.  There have been multiple episodes of vomiting at home with emesis usually appearing dark in nature.  She states he has not been compliant with taking his Creon or his pantoprazole.  She actually states that he has not been taking any of his medications as he is supposed to.  She has also noted some intermittent confusion over the last few months.  She has stated that he does not admit that he has a problem and has not been connecting  the dots that a lot of the alcohol use has been causing a lot of his symptoms.  His wife also states that he is requesting to go home because he wants to start the cycle all over again and would like to go back to drinking.  She noted to me that he told her brother when he brought him over to the hospital to have a bottle for him when he got back home.     Past Medical History:  Diagnosis Date   Acute pancreatitis 10/25/2013   Arthritis    Diabetes mellitus without complication (HCC)    GERD (gastroesophageal reflux disease)    Hepatic steatosis    History of pancreatitis    Hypertension    Myalgia and myositis 04/21/2014   much better from illness presumed infectious    has disability form  will complete     Pancreatic pseudocyst     Past Surgical History:  Procedure Laterality Date   ANTERIOR CERVICAL DECOMP/DISCECTOMY FUSION N/A 01/12/2018   Procedure: Revision  ACDF C4-5, removal of hardware C5-6, exploration of fusion C5-6;  Surgeon: [name], MD;  Location: [facility];  Service: Orthopedics;  Laterality: N/A;  3.5 hrs   BIOPSY  05/13/2020   Procedure: BIOPSY;  Surgeon: Eloise Harman, DO;  [facility];  Service: Endoscopy;;   BIOPSY  09/23/2020   Procedure: BIOPSY;  Surgeon: Eloise Harman, DO;  [facility];  Service: Endoscopy;;   COLONOSCOPY WITH PROPOFOL N/A 05/13/2020   Dr. Abbey Chatters: Nonbleeding internal hemorrhoids.  Few small mouth diverticula in the sigmoid colon.  2 cecal polyps, inflammatory.  Sigmoid colon polyp hyperplastic.  Localized area of granular mucosa in the transverse colon, status post biopsy which was unremarkable.  No microscopic colitis.  Plans for repeat colonoscopy in 5 years.   ESOPHAGEAL BRUSHING  05/13/2020   Procedure: ESOPHAGEAL BRUSHING;  Surgeon: Eloise Harman, DO;  [facility];  Service: Endoscopy;;  mid esophagus   ESOPHAGOGASTRODUODENOSCOPY (EGD) WITH PROPOFOL N/A 05/13/2020   Dr. Abbey Chatters: Moderately severe  esophagitis with no bleeding found in the middle third of the esophagus, suspicious for Candida esophagitis.  KOH was negative. Gastritis, duodenal polyp.  Duodenal biopsy benign.  Duodenal bulb biopsy with Brunner's gland hyperplasia and mild changes suggestive of peptic injury.  No celiac disease seen.  GE junction biopsy showed GERD with mild inflammation consisten   ESOPHAGOGASTRODUODENOSCOPY (EGD) WITH PROPOFOL N/A 09/23/2020   Procedure: ESOPHAGOGASTRODUODENOSCOPY (EGD) WITH PROPOFOL;  Surgeon: Eloise Harman, DO;  [facility];  Service: Endoscopy;  Laterality: N/A;  7:30am   NECK SURGERY     2 disc   POLYPECTOMY  05/13/2020   Procedure: POLYPECTOMY;  Surgeon: Eloise Harman, DO;  [facility];  Service: Endoscopy;;   SPINAL FUSION      Prior to Admission medications   Medication Sig Start Date End Date Taking? Authorizing Provider  albuterol (VENTOLIN HFA) 108 (90 Base) MCG/ACT inhaler Inhale 2 puffs into the lungs every 4 (four) hours as needed for wheezing or shortness of breath. 07/03/21  Yes Emokpae, Courage, MD  amlodipine-benazepril (LOTREL) 2.5-10 MG capsule Take 1 capsule by mouth daily. For high blood pressure .Dose adjustment 07/03/21  Yes Emokpae, Courage, MD  atorvastatin (LIPITOR) 40 MG tablet Take 1 tablet (40 mg total) by mouth daily. 07/03/21  Yes Emokpae, Courage, MD  Brimonidine Tartrate (LUMIFY) 0.025 % SOLN Place 1 drop into both eyes daily as needed (eye irritation/dry eyes.).   Yes [provider]  citalopram (CELEXA) 20 MG tablet Take 10 mg per day for 1-2 weeks then increase to 20 mg per day or as directed 07/03/21  Yes Emokpae, Courage, MD  empagliflozin (JARDIANCE) 25 MG TABS tablet Take 1 tablet (25 mg total) by mouth daily. 04/15/22 05/15/22 Yes Panosh, Standley Brooking, MD  folic acid (FOLVITE) 1 MG tablet Take 1 tablet (1 mg total) by mouth daily. 07/04/21  Yes Emokpae, Courage, MD  gabapentin (NEURONTIN) 300 MG capsule Take 1 capsule (300  mg total) by mouth 3 (three) times daily. 07/03/21  Yes Emokpae, Courage, MD  lipase/protease/amylase (CREON) 36000 UNITS CPEP capsule Take 1-2 capsules (36,000-72,000 Units total) by mouth See admin instructions. Take two with meals and one with snacks (total of 8 per day) 07/03/21  Yes Emokpae, Courage, MD  LORazepam (ATIVAN) 1 MG tablet Take 1 tablet (1 mg total) by mouth every 8 (eight) hours as needed for anxiety. Panic attacks 05/24/18  Yes Panosh, Standley Brooking, MD  Multiple Vitamin (  MULTIVITAMIN WITH MINERALS) TABS tablet Take 1 tablet by mouth daily. 07/04/21  Yes Emokpae, Courage, MD  pantoprazole (PROTONIX) 40 MG tablet TAKE 1 TABLET BY MOUTH EVERY DAY 12/17/21  Yes Hurshel Keys K, DO  thiamine 100 MG tablet Take 1 tablet (100 mg total) by mouth daily. 07/04/21  Yes Emokpae, Courage, MD  TRESIBA FLEXTOUCH 100 UNIT/ML FlexTouch Pen Inject 18 Units into the skin daily. 07/03/21  Yes Emokpae, Courage, MD  Continuous Blood Gluc Sensor (DEXCOM G6 SENSOR) MISC 1 Device by Does not apply route as directed. 07/03/21   Roxan Hockey, MD  Continuous Blood Gluc Transmit (DEXCOM G6 TRANSMITTER) MISC 1 Device by Does not apply route as directed. 07/03/21   Roxan Hockey, MD  Cyanocobalamin (VITAMIN B-12 PO) Take 1 tablet by mouth daily.    [provider]  dicyclomine (BENTYL) 10 MG capsule TAKE 1 CAPSULE FOUR TIMES A DAY BEFORE MEALS AND AT BEDTIME AS NEEDED FOR ABDOMINAL CRAMPING Patient not taking: Reported on 04/16/2022 01/30/21   Erenest Rasher, PA-C  glucose blood (ONE TOUCH TEST STRIPS) test strip Use as instructed 07/03/21   Roxan Hockey, MD  insulin aspart (NOVOLOG FLEXPEN) 100 UNIT/ML FlexPen Inject 6 Units into the skin 3 (three) times daily with meals. Patient not taking: Reported on 04/16/2022 07/03/21   Roxan Hockey, MD  Insulin Pen Needle 32G X 4 MM MISC 1 Device by Does not apply route in the morning, at noon, in the evening, and at bedtime. 07/03/21   Roxan Hockey, MD   ondansetron (ZOFRAN ODT) 4 MG disintegrating tablet Take 1 tablet (4 mg total) by mouth every 8 (eight) hours as needed for nausea or vomiting. Patient not taking: Reported on 04/16/2022 01/12/18   Mayo, Darla Lesches, PA-C    Current Facility-Administered Medications  Medication Dose Route Frequency Provider Last Rate Last Admin   acetaminophen (TYLENOL) tablet 650 mg  650 mg Oral Q6H PRN Emokpae, Ejiroghene E, MD   650 mg at 04/16/22 1949   Or   acetaminophen (TYLENOL) suppository 650 mg  650 mg Rectal Q6H PRN Emokpae, Ejiroghene E, MD       amLODipine (NORVASC) tablet 2.5 mg  2.5 mg Oral Daily Emokpae, Ejiroghene E, MD       And   benazepril (LOTENSIN) tablet 10 mg  10 mg Oral Daily Emokpae, Ejiroghene E, MD       Chlorhexidine Gluconate Cloth 2 % PADS 6 each  6 each Topical Daily Emokpae, Ejiroghene E, MD   6 each at 04/16/22 1927   citalopram (CELEXA) tablet 20 mg  20 mg Oral Daily Emokpae, Ejiroghene E, MD       dextrose 50 % solution 0-50 mL  0-50 mL Intravenous PRN Emokpae, Ejiroghene E, MD   50 mL at 04/17/22 0404   feeding supplement (ENSURE ENLIVE / ENSURE PLUS) liquid 237 mL  237 mL Oral BID BM Emokpae, Ejiroghene E, MD       folic acid (FOLVITE) tablet 1 mg  1 mg Oral Daily Emokpae, Ejiroghene E, MD       gabapentin (NEURONTIN) capsule 300 mg  300 mg Oral TID Emokpae, Ejiroghene E, MD   300 mg at 04/16/22 2104   insulin aspart (novoLOG) injection 0-15 Units  0-15 Units Subcutaneous Q4H Emokpae, Ejiroghene E, MD   2 Units at 04/17/22 0808   lipase/protease/amylase (CREON) capsule 36,000 Units  36,000 Units Oral Daily PRN Emokpae, Ejiroghene E, MD       lipase/protease/amylase (CREON) capsule 72,000 Units  72,000 Units Oral TID AC Emokpae, Ejiroghene E, MD       LORazepam (ATIVAN) injection 0-4 mg  0-4 mg Intravenous Q6H Emokpae, Ejiroghene E, MD       Followed by   Derrill Memo ON 04/18/2022] LORazepam (ATIVAN) injection 0-4 mg  0-4 mg Intravenous Q12H Emokpae, Ejiroghene E, MD        LORazepam (ATIVAN) tablet 1-4 mg  1-4 mg Oral Q1H PRN Emokpae, Ejiroghene E, MD       Or   LORazepam (ATIVAN) injection 1-4 mg  1-4 mg Intravenous Q1H PRN Emokpae, Ejiroghene E, MD       multivitamin with minerals tablet 1 tablet  1 tablet Oral Daily Emokpae, Ejiroghene E, MD       nicotine (NICODERM CQ - dosed in mg/24 hours) patch 14 mg  14 mg Transdermal Daily Emokpae, Ejiroghene E, MD   14 mg at 04/16/22 1950   ondansetron (ZOFRAN) tablet 4 mg  4 mg Oral Q6H PRN Emokpae, Ejiroghene E, MD       Or   ondansetron (ZOFRAN) injection 4 mg  4 mg Intravenous Q6H PRN Emokpae, Ejiroghene E, MD       pantoprazole (PROTONIX) injection 40 mg  40 mg Intravenous Q12H Emokpae, Ejiroghene E, MD   40 mg at 04/16/22 2104   polyethylene glycol (MIRALAX / GLYCOLAX) packet 17 g  17 g Oral Daily PRN Emokpae, Ejiroghene E, MD       potassium chloride 10 mEq in 100 mL IVPB  10 mEq Intravenous Q1 Hr x 2 Adefeso, Oladapo, DO 100 mL/hr at 04/17/22 0816 10 mEq at 04/17/22 0816   thiamine tablet 100 mg  100 mg Oral Daily Emokpae, Ejiroghene E, MD       Or   thiamine (B-1) injection 100 mg  100 mg Intravenous Daily Emokpae, Ejiroghene E, MD        Allergies as of 04/16/2022   (No Known Allergies)    Family History  Problem Relation Age of Onset   Diabetes Mother    Hypertension Mother    Diabetes Father    Hypertension Father    Arthritis Maternal Grandmother    Hypertension Maternal Grandfather    Diabetes Maternal Grandfather    Colon cancer Neg Hx    Pancreatitis Neg Hx    Pancreatic cancer Neg Hx     Social History   Socioeconomic History   Marital status: Married    Spouse name: Not on file   Number of children: 1   Years of education: Not on file   Highest education level: Not on file  Occupational History    Employer: [employer]  Tobacco Use   Smoking status: Every Day    Packs/day: 1.00    Years: 12.00    Total pack years: 12.00    Types: Cigarettes    Passive exposure: Current    Smokeless tobacco: Never  Vaping Use   Vaping Use: Never used  Substance and Sexual Activity   Alcohol use: Not Currently   Drug use: No   Sexual activity: Yes    Partners: Female    Comment: Patient's Wife  Other Topics Concern   Not on file  Social History Narrative   5 hours of sleep    2 people living in the home   A dog living in the home   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   Eye exam nany clark    FA tobacco some  etoh neg rd exercises   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             Social Determinants of Health   Financial Resource Strain: Low Risk  (06/12/2021)   Overall Financial Resource Strain (CARDIA)    Difficulty of Paying Living Expenses: Not hard at all  Food Insecurity: No Food Insecurity (06/12/2021)   Hunger Vital Sign    Worried About Running Out of Food in the Last Year: Never true    Ran Out of Food in the Last Year: Never true  Transportation Needs: No Transportation Needs (06/12/2021)   PRAPARE - Hydrologist (Medical): No    Lack of Transportation (Non-Medical): No  Physical Activity: Insufficiently Active (05/29/2021)   Exercise Vital Sign    Days of Exercise per Week: 3 days    Minutes of Exercise per Session: 30 min  Stress: No Stress Concern Present (06/12/2021)   Kathleen    Feeling of Stress : Not at all  Social Connections: [place] (05/29/2021)   Social Connection and Isolation Panel [NHANES]    Frequency of Communication with Friends and Family: Three times a week    Frequency of Social Gatherings with Friends and Family: Three times a week    Attends Religious Services: More than 4 times per year    Active Member of Clubs or Organizations: Yes    Attends Archivist Meetings: More than 4 times per year    Marital Status: Married  Human resources officer Violence: Not At Risk (05/29/2021)    Humiliation, Afraid, Rape, and Kick questionnaire    Fear of Current or Ex-Partner: No    Emotionally Abused: No    Physically Abused: No    Sexually Abused: No     Review of Systems   Gen: Denies any fever, chills, loss of appetite, change in weight or weight loss CV: Denies chest pain, heart palpitations, syncope, edema  Resp: Denies shortness of breath with rest, cough, wheezing, coughing up blood, and pleurisy. GI: see HPI GU : Denies urinary burning, blood in urine, urinary frequency, and urinary incontinence. MS: Denies joint pain, limitation of movement, swelling, cramps, and atrophy.  Derm: Denies rash, itching, dry skin, hives. Psych: Denies depression, anxiety, memory loss, hallucinations, and confusion. Heme: Denies bruising or bleeding Neuro:  Denies any headaches, dizziness, paresthesias, shaking  Physical Exam   Vital Signs in last 24 hours: Temp:  [97.7 F (36.5 C)-98.8 F (37.1 C)] 97.7 F (36.5 C) (07/14 0759) Pulse Rate:  [55-83] 71 (07/14 0759) Resp:  [9-26] 11 (07/14 0759) BP: (90-185)/(64-108) 124/80 (07/14 0600) SpO2:  [98 %-100 %] 100 % (07/14 0759) FiO2 (%):  [21 %] 21 % (07/13 1900) Weight:  [57.2 kg-65.8 kg] 65.8 kg (07/14 0400) Last BM Date : 04/17/22  General:   Alert,  Well-developed, well-nourished, pleasant and cooperative in NAD Head:  Normocephalic and atraumatic. Eyes:  Sclera clear, no icterus.   Conjunctiva pink. Ears:  Normal auditory acuity. Neck:  Supple; no masses Lungs:  Clear throughout to auscultation.   No wheezes, crackles, or rhonchi. No acute distress. Heart:  Regular rate and rhythm; no murmurs, clicks, rubs,  or gallops. Abdomen:  Soft, nontender and nondistended. No masses, hepatosplenomegaly or hernias noted. Normal bowel sounds, without guarding, and without rebound.   Rectal: deferred   Extremities:  Without clubbing or edema. Neurologic:  Alert and  oriented x4. Skin:  Intact without significant lesions or  rashes. Psych:  Alert and cooperative. Normal mood and affect.  Intake/Output from previous day: 07/13 0701 - 07/14 0700 In: 1899 [I.V.:1517.7; IV Piggyback:381.4] Out: -  Intake/Output this shift: Total I/O In: -  Out: 750 [Urine:750]   Labs/Studies   Recent Labs Recent Labs    04/16/22 1202 04/16/22 1814 04/16/22 2207 04/17/22 0402  WBC 2.5*  --  2.5* 2.1*  HGB 9.1* 11.4* 7.5* 7.8*  HCT 26.0* 31.6* 21.7* 22.5*  PLT 96*  --  82* 85*   BMET Recent Labs    04/16/22 1202 04/16/22 1814 04/17/22 0402  NA 128* 131* 136  K 2.7* 2.5* 2.8*  CL 88* 93* 102  CO2 27 28 28   GLUCOSE 599* 228* 50*  BUN 7 6 <5*  CREATININE 0.78 0.59* 0.48*  CALCIUM 8.5* 8.3* 8.1*   LFT Recent Labs    04/15/22 1023 04/16/22 1202 04/17/22 0402  PROT 6.4 6.9 5.2*  ALBUMIN 4.1 4.0 2.9*  AST 115* 76* 52*  ALT 68* 67* 45*  ALKPHOS 672* 530* 359*  BILITOT 1.4* 1.7* 0.8   PT/INR Recent Labs    04/16/22 1202  LABPROT 13.8  INR 1.1   Hepatitis Panel No results for input(s): "HEPBSAG", "HCVAB", "HEPAIGM", "HEPBIGM" in the last 72 hours. C-Diff No results for input(s): "CDIFFTOX" in the last 72 hours.  Radiology/Studies CT Abdomen Pelvis W Contrast  Result Date: 04/16/2022 CLINICAL DATA:  Increased bowel movements about 10 per day over past month, no control of bowel movements, acute abdominal pain, history pancreatitis, diabetes mellitus, hypertension, GERD EXAM: CT ABDOMEN AND PELVIS WITH CONTRAST TECHNIQUE: Multidetector CT imaging of the abdomen and pelvis was performed using the standard protocol following bolus administration of intravenous contrast. RADIATION DOSE REDUCTION: This exam was performed according to the departmental dose-optimization program which includes automated exposure control, adjustment of the mA and/or kV according to patient size and/or use of iterative reconstruction technique. CONTRAST:  180m OMNIPAQUE IOHEXOL 300 MG/ML SOLN IV. No oral contrast. COMPARISON:   05/28/2019 FINDINGS: Lower chest: Lung bases clear Hepatobiliary: Marked fatty infiltration of liver. No focal hepatic abnormality. Gallbladder normal appearance. No biliary dilatation. Pancreas: Marked pancreatic atrophy with ductal dilatation and numerous calcifications consistent with chronic calcific pancreatitis. Fluid collection identified associated with the pancreatic tail and medial margin of the spleen, collection measuring 5.5 x 4.7 x 4.9 cm consistent with pseudo cyst; previously this measured 6.3 x 5.1 x 5.6 cm. Spleen: Remainder of spleen unremarkable.  Small adjacent splenule. Adrenals/Urinary Tract: Adrenal glands, kidneys, ureters, and bladder normal appearance Stomach/Bowel: Normal appendix. Stomach decompressed with poor assessment of proximal gastric wall thickness. Few prominent areas of enhancement at the lower rectum/anus question hemorrhoids. Large and small bowel loops otherwise unremarkable. Vascular/Lymphatic: Atherosclerotic calcifications aorta and iliac arteries without aneurysm. No adenopathy. Reproductive: Unremarkable prostate gland and seminal vesicles Other: Scattered soft tissue edema in abdomen. No free air or discrete ascites. No hernia. Musculoskeletal: Question small enchondroma at intertrochanteric region LEFT femur. No acute osseous findings. 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 Marked fatty infiltration of liver. Few prominent areas of enhancement at the lower rectum/anus question hemorrhoids. Aortic Atherosclerosis (ICD10-I70.0). Electronically Signed   By: MLavonia DanaM.D.   On: 04/16/2022 16:23   DG Chest Port 1 View  Result Date: 04/16/2022 CLINICAL DATA:  Shortness of breath. EXAM: PORTABLE CHEST  1 VIEW COMPARISON:  06/30/2021 and prior radiographs. FINDINGS: The cardiomediastinal silhouette is unremarkable. There is no  evidence of focal airspace disease, pulmonary edema, suspicious pulmonary nodule/mass, pleural effusion, or pneumothorax. No acute bony abnormalities are identified. IMPRESSION: No active disease. Electronically Signed   By: Margarette Canada M.D.   On: 04/16/2022 12:26     Assessment   Todd Rodriguez is a 46 y.o. year old male with history of chronic GERD, weight loss, rectal bleeding, pancreatic pseudocyst previously monitored with MRIs, last MRI in January 2022 showing no significant change and no further surveillance recommended, hepatic steatosis, chronic alcohol abuse, diabetes, and HTN who presented to the ED with dizziness over the last few days as well as dyspnea on exertion, diarrhea.  Patient stated he was told to present to the ER by Dr. Abbey Chatters due to his recent blood work.  Spouse reported vomiting x3 that occurred 2 days ago.  GI consulted for further evaluation and management of chronic blood loss anemia and heme positive stool.  Symptomatic anemia:  Hgb normal in September 2022. Check on 04/15/22 after office visit revealed Hgb 8.5. On admission Hgb 9.1. He has had associated dyspnea on exertion and dizziness over the last few days. Anemia panel revealing low iron at 40, saturation 19%, and elevated ferritin. B12 elevated and folate normal. Stool was heme positive. Patient has history of esophagitis and gastritis noted on prior EGD with most recent in December 2021. Prior colonoscopy in 2021 with 3 inflammatory and hyperplastic polyps removed with recommendation for repeat in 5 years.  Per his med list he should have been taking Creon supplementation with every meal as well as PPI daily.  Per his wife patient has been noncompliant with medications including his PPI.  She notes significant heavy alcohol use as well as ongoing intermittent melanotic stools.  She also reports his emesis has been dark and coffee-ground like.  For now he has been placed on IV PPI BID and needs EGD for further evaluation.  Likely secondary to esophagitis and gastritis related to alcohol use but will rule out Mallory Weiss tear or Dieulafoy lesion although unlikely given no report of hematemesis.   Transaminitis: AST 76, ALT 67, alk phos 530, T. bili 1.7 on admission. These are slightly decreased from prior labs obtained on 04/15/12 with the exception of T bili. - AST 115, ALT 68, alk phos 672, T. bili 1.4. CT A/P with findings consistent with history of hepatic steatosis likely related to chronic alcohol abuse and DKA. No evidence of cirrhosis. Acute hepatitis panel pending. With IV hydration and correction of DKA his labs are improve today with LFTs and alk phos continuing to improve and T. Bili normalized.   Chronic pancreatitis/pancreatic pseudocyst: CT A/P with contrast -chronic calcific pancreatitis with pseudocyst at the pancreatic tail measuring 5.5 x 4.7 x 4.9 (decreased from prior).  Patient has pancreatic enzyme replacement on his med list.  His wife reports he has been noncompliant with this at home. Also reports poor nutrition which likely has resulted in his weight loss as his main consumption for the day is alcohol.  While inpatient we will continue Creon supplementation.  I have discussed complete alcohol cessation with him.  Diarrhea: Likely secondary to chronic pancreatitis as well as diabetes.  His wife states this is not a recurrent acute issue.  He has had chronic diarrhea for at least a year now resulting in him wearing depends and having frequent fecal incontinence due to loose stools.  Diarrhea  is likely secondary to chronic pancreatitis and noncompliance with pancreatic enzyme replacement and ongoing alcohol use.  Hypokalemia: Potassium 2.8 this morning, 2.7 on admission. Likely secondary to dehydration, chronic alcohol use, poor nutrition, and diarrhea. He received IV and oral repletion yesterday and received additional 40 mEq oral this morning. Will need recheck prior to undergoing EGD.    Patient  is agreeable to proceed with EGD.  I have discussed the risks, alternatives, benefits with regards to but not limited to the risk of reaction to medication, bleeding, infection, perforation and the patient is agreeable to proceed. Written consent to be obtained.   Plan / Recommendations   Continue pancreatic enzymes PPI IV twice daily Potassium replacement and Recheck BMP  Continue to monitor CBC, CMP, PT/INR Follow up hepatitis panel NPO EGD with Dr. Gala Romney today. Possible social work involvement for alcohol cessation resources.     04/17/2022, 9:06 AM  Venetia Night, MSN, FNP-BC, AGACNP-BC [facility]

## 2022-04-18 DIAGNOSIS — K860 Alcohol-induced chronic pancreatitis: Secondary | ICD-10-CM | POA: Diagnosis not present

## 2022-04-18 DIAGNOSIS — R7989 Other specified abnormal findings of blood chemistry: Secondary | ICD-10-CM | POA: Diagnosis not present

## 2022-04-18 DIAGNOSIS — R195 Other fecal abnormalities: Secondary | ICD-10-CM | POA: Diagnosis not present

## 2022-04-18 DIAGNOSIS — E1110 Type 2 diabetes mellitus with ketoacidosis without coma: Secondary | ICD-10-CM | POA: Diagnosis not present

## 2022-04-18 LAB — BASIC METABOLIC PANEL
Anion gap: 10 (ref 5–15)
BUN: 5 mg/dL — ABNORMAL LOW (ref 6–20)
CO2: 25 mmol/L (ref 22–32)
Calcium: 8.6 mg/dL — ABNORMAL LOW (ref 8.9–10.3)
Chloride: 100 mmol/L (ref 98–111)
Creatinine, Ser: 0.61 mg/dL (ref 0.61–1.24)
GFR, Estimated: >60 mL/min (ref >60)
Glucose, Bld: 214 mg/dL — ABNORMAL HIGH (ref 70–99)
Potassium: 3.4 mmol/L — ABNORMAL LOW (ref 3.5–5.1)
Sodium: 135 mmol/L (ref 135–145)

## 2022-04-18 LAB — GI PROFILE, STOOL, PCR

## 2022-04-18 LAB — GLUCOSE, CAPILLARY
Glucose-Capillary: 171 mg/dL — ABNORMAL HIGH (ref 70–99)
Glucose-Capillary: 30 mg/dL — CL (ref 70–99)
Glucose-Capillary: 329 mg/dL — ABNORMAL HIGH (ref 70–99)
Glucose-Capillary: 376 mg/dL — ABNORMAL HIGH (ref 70–99)
Glucose-Capillary: 388 mg/dL — ABNORMAL HIGH (ref 70–99)

## 2022-04-18 MED ORDER — INSULIN ASPART 100 UNIT/ML IJ SOLN
0.0000 [IU] | Freq: Three times a day (TID) | INTRAMUSCULAR | Status: DC
  Administered 2022-04-18: 15 [IU] via SUBCUTANEOUS
  Administered 2022-04-18: 11 [IU] via SUBCUTANEOUS

## 2022-04-18 MED ORDER — INSULIN ASPART 100 UNIT/ML IJ SOLN: 0.0000 [IU] | Freq: Every day | INTRAMUSCULAR | Status: DC

## 2022-04-18 MED ORDER — POTASSIUM CHLORIDE CRYS ER 20 MEQ PO TBCR
20.0000 meq | EXTENDED_RELEASE_TABLET | Freq: Once | ORAL | Status: AC
Start: 2022-04-18 — End: 2022-04-18
  Administered 2022-04-18: 20 meq via ORAL
  Filled 2022-04-18: qty 1

## 2022-04-18 NOTE — Progress Notes (Signed)
Eye drops returned to patient from home med's verified with pharmacy.

## 2022-04-18 NOTE — Discharge Summary (Signed)
Physician Discharge Summary   Patient: Todd Rodriguez MRN: [id number] DOB: Feb 24, 1976  Admit date:     04/16/2022  Discharge date: 04/18/22  Discharge Physician: Shanon Brow Jady Braggs   PCP: Burnis Medin, MD   Recommendations at discharge:   Please follow up with primary care provider within 1-2 weeks  Please repeat BMP, LFTs and CBC in one week    Hospital Course: 46 year old male with a history of diabetes mellitus type 2, hypertension, alcohol abuse, tobacco abuse, GERD presenting with abnormal labs.  In speaking with the patient he has been complaining of some mild dyspnea on exertion as well as heart racing sensation with minimal exertion.  He states that this has been going on for couple months.  He is a difficult historian.  He does endorse some generalized weakness and dizziness that has worsened over the past 2 weeks.  He denies any fevers, chills, chest pain, cough, hemoptysis, hematochezia, melena.  He states that he has been having 10-15 loose bowel movements on a daily basis for the past 2 to 3 months.  He describes it as toothpaste like consistency.  He denies any new medications.  He denies any abdominal pain, nausea, vomiting, hematemesis.  He went to see GI, Dr. Abbey Chatters on 04/15/2022.  Routine blood work showed his hemoglobin was 8.5 on 04/15/2022. In the ED, the patient was afebrile and hemodynamically stable.  BMP showed sodium 136, potassium 2.8, serum creatinine 0.48.  AST 115, ALT 60, alk phosphatase 672, total bilirubin 1.7.  WBC 2.5, hemoglobin 7.5, platelets 85,000.  The patient was started on IV pantoprazole.  GI was consulted to assist with management.  Assessment and Plan: * Heme positive stool Patient has chronic blood loss anemia/symptomatic anemia Hemoglobin 14.3 on 07/01/2021 Presented with hemoglobin 8.5 Patient endorses taking Goody powders GI consult appreciated Iron saturation 19%, ferritin 778, E42 3536, folic acid 14.4 12/03/5398 EGD--moderately severe esophagitis  with no bleeding found in the middle third of the esophagus, suspicious for Candida esophagitis.  KOH was negative.  He was treated with 2 weeks of Diflucan 05/13/2020 colonoscopy--2 cecal polyps removed, inflammatory, 1 sigmoid polyp removed, hyperplastic, granular mucosa in the transverse colon biopsy negative. 04/17/22 EGD--normal esophagus;  Scattered gastric petechiae--findings consistent with Etoh/NSAID gastropathy Continue PPT after d/c  Pancytopenia ([place]) In part due to chronic alcohol use Serum Q67 6195 Folic acid 09.3 TSH 2.671 -alcohol cessation advised  DKA, type 2 ([place]) -patient started on IV insulin in ED -Presented with serum glucose 599, anion gap 13, ketonuria -pt started on aggressive fluid resuscitation -Electrolytes were monitored and repleted -transitioned to [place] insulin  -diet was advanced once anion gap closed -04/16/2022 HbA1C--8.8   Uncontrolled type 2 diabetes mellitus with hyperglycemia, with long-term current use of insulin ([place]) 04/16/2022 hemoglobin A1c 8.8 Patient endorses poor compliance with [employer] in anticipation for procedure today>>restarted after procedure NovoLog sliding scale Diet advanced which he tolerated  Hypokalemia Potassium- 2.5.  Magnesium 1.8. -Repleted K  Tobacco abuse Smokes a pack of cigarettes daily. - Nicotine patch -Tobacco cessation discussed  Loose stools Likely pancreatic insufficiency Continue creaon  Alcohol abuse Drinks every day.  Baseline drinks 10-12 beers every day and 1/2 of a 5th of vodka.   Last drink was 7/12 evening with dinner -about 24 hours ago.  History of withdrawal with shakes and diaphoresis, but no seizure history.   -CIWA as needed and scheduled -Check Phos 3.3, mag 1.8 -Thiamine, folate, multivitamins - cessation advised  Abnormal LFTs AST 76, ALT  67, ALP 530.  Total bilirubin 1.7.   related to marked fatty liver infiltration seen on CT, and chronic alcohol abuse. -Hold  statins>>resume after d/c -LFTs trending down  Chronic pancreatitis ([facility]) CT A/P with contrast -chronic calcific pancreatitis with pseudocyst at the pancreatic tail measuring 5.5 x 4.7 x 4.9 (decreased from prior).  Patient has pancreatic enzyme replacement on his med list.  His wife reports he has been noncompliant with this at home. Also reports poor nutrition which likely has resulted in his weight loss as his main consumption for the day is alcohol  Essential hypertension, benign Resume amlodipine and benazepril.         Consultants: GI Procedures performed: EGD  Disposition: Home Diet recommendation:  Carb modified diet DISCHARGE MEDICATION: Allergies as of 04/18/2022   No Known Allergies      Medication List     STOP taking these medications    dicyclomine 10 MG capsule Commonly known as: BENTYL   NovoLOG FlexPen 100 UNIT/ML FlexPen Generic drug: insulin aspart   ondansetron 4 MG disintegrating tablet Commonly known as: Zofran ODT       TAKE these medications    albuterol 108 (90 Base) MCG/ACT inhaler Commonly known as: VENTOLIN HFA Inhale 2 puffs into the lungs every 4 (four) hours as needed for wheezing or shortness of breath.   amlodipine-benazepril 2.5-10 MG capsule Commonly known as: LOTREL Take 1 capsule by mouth daily. For high blood pressure .Dose adjustment   atorvastatin 40 MG tablet Commonly known as: LIPITOR Take 1 tablet (40 mg total) by mouth daily.   citalopram 20 MG tablet Commonly known as: CELEXA Take 10 mg per day for 1-2 weeks then increase to 20 mg per day or as directed   Dexcom G6 Sensor Misc 1 Device by Does not apply route as directed.   Dexcom G6 Transmitter Misc 1 Device by Does not apply route as directed.   empagliflozin 25 MG Tabs tablet Commonly known as: JARDIANCE Take 1 tablet (25 mg total) by mouth daily.   folic acid 1 MG tablet Commonly known as: FOLVITE Take 1 tablet (1 mg total) by mouth daily.    gabapentin 300 MG capsule Commonly known as: NEURONTIN Take 1 capsule (300 mg total) by mouth 3 (three) times daily.   glucose blood test strip Commonly known as: ONE TOUCH TEST STRIPS Use as instructed   Insulin Pen Needle 32G X 4 MM Misc 1 Device by Does not apply route in the morning, at noon, in the evening, and at bedtime.   lipase/protease/amylase 36000 UNITS Cpep capsule Commonly known as: Creon Take 1-2 capsules (36,000-72,000 Units total) by mouth See admin instructions. Take two with meals and one with snacks (total of 8 per day)   LORazepam 1 MG tablet Commonly known as: Ativan Take 1 tablet (1 mg total) by mouth every 8 (eight) hours as needed for anxiety. Panic attacks   Lumify 0.025 % Soln Generic drug: Brimonidine Tartrate Place 1 drop into both eyes daily as needed (eye irritation/dry eyes.).   multivitamin with minerals Tabs tablet Take 1 tablet by mouth daily.   pantoprazole 40 MG tablet Commonly known as: PROTONIX TAKE 1 TABLET BY MOUTH EVERY DAY   thiamine 100 MG tablet Take 1 tablet (100 mg total) by mouth daily.   Tyler Aas FlexTouch 100 UNIT/ML FlexTouch Pen Generic drug: insulin degludec Inject 18 Units into the skin daily.   VITAMIN B-12 PO Take 1 tablet by mouth daily.  Discharge Exam: Filed Weights   04/17/22 0400 04/17/22 1323 04/17/22 1340  Weight: 65.8 kg 65.8 kg 65 kg   HEENT:  [place]/AT, No thrush, no icterus CV:  RRR, no rub, no S3, no S4 Lung:  CTA, no wheeze, no rhonchi Abd:  soft/+BS, NT Ext:  No edema, no lymphangitis, no synovitis, no rash   Condition at discharge: stable  The results of significant diagnostics from this hospitalization (including imaging, microbiology, ancillary and laboratory) are listed below for reference.   Imaging Studies: CT Abdomen Pelvis W Contrast  Result Date: 04/16/2022 CLINICAL DATA:  Increased bowel movements about 10 per day over past month, no control of bowel movements, acute  abdominal pain, history pancreatitis, diabetes mellitus, hypertension, GERD EXAM: CT ABDOMEN AND PELVIS WITH CONTRAST TECHNIQUE: Multidetector CT imaging of the abdomen and pelvis was performed using the standard protocol following bolus administration of intravenous contrast. RADIATION DOSE REDUCTION: This exam was performed according to the departmental dose-optimization program which includes automated exposure control, adjustment of the mA and/or kV according to patient size and/or use of iterative reconstruction technique. CONTRAST:  138m OMNIPAQUE IOHEXOL 300 MG/ML SOLN IV. No oral contrast. COMPARISON:  05/28/2019 FINDINGS: Lower chest: Lung bases clear Hepatobiliary: Marked fatty infiltration of liver. No focal hepatic abnormality. Gallbladder normal appearance. No biliary dilatation. Pancreas: Marked pancreatic atrophy with ductal dilatation and numerous calcifications consistent with chronic calcific pancreatitis. Fluid collection identified associated with the pancreatic tail and medial margin of the spleen, collection measuring 5.5 x 4.7 x 4.9 cm consistent with pseudo cyst; previously this measured 6.3 x 5.1 x 5.6 cm. Spleen: Remainder of spleen unremarkable.  Small adjacent splenule. Adrenals/Urinary Tract: Adrenal glands, kidneys, ureters, and bladder normal appearance Stomach/Bowel: Normal appendix. Stomach decompressed with poor assessment of proximal gastric wall thickness. Few prominent areas of enhancement at the lower rectum/anus question hemorrhoids. Large and small bowel loops otherwise unremarkable. Vascular/Lymphatic: Atherosclerotic calcifications aorta and iliac arteries without aneurysm. No adenopathy. Reproductive: Unremarkable prostate gland and seminal vesicles Other: Scattered soft tissue edema in abdomen. No free air or discrete ascites. No hernia. Musculoskeletal: Question small enchondroma at intertrochanteric region LEFT femur. No acute osseous findings. IMPRESSION: Chronic  calcific pancreatitis with a 5.5 x 4.7 x 4.9 cm diameter pseudo cyst associated with the pancreatic tail and medial margin of the spleen, slightly decreased. Marked fatty infiltration of liver. Few prominent areas of enhancement at the lower rectum/anus question hemorrhoids. Aortic Atherosclerosis (ICD10-I70.0). Electronically Signed   By: MLavonia DanaM.D.   On: 04/16/2022 16:23   DG Chest Port 1 View  Result Date: 04/16/2022 CLINICAL DATA:  Shortness of breath. EXAM: PORTABLE CHEST 1 VIEW COMPARISON:  06/30/2021 and prior radiographs. FINDINGS: The cardiomediastinal silhouette is unremarkable. There is no evidence of focal airspace disease, pulmonary edema, suspicious pulmonary nodule/mass, pleural effusion, or pneumothorax. No acute bony abnormalities are identified. IMPRESSION: No active disease. Electronically Signed   By: JMargarette CanadaM.D.   On: 04/16/2022 12:26    Microbiology: Results for orders placed or performed during the hospital encounter of 04/16/22  MRSA Next Gen by PCR, Nasal     Status: None   Collection Time: 04/16/22  7:34 PM   Specimen: Nasal Mucosa; Nasal Swab  Result Value Ref Range Status   MRSA by PCR Next Gen NOT DETECTED NOT DETECTED Final    Comment: (NOTE) The GeneXpert MRSA Assay (FDA approved for NASAL specimens only), is one component of a comprehensive MRSA colonization surveillance program. It is not intended to  diagnose MRSA infection nor to guide or monitor treatment for MRSA infections. Test performance is not FDA approved in patients less than 66 years old. Performed at [facility], [address] [place], [place] [address]     Labs: CBC: Recent Labs  Lab 04/15/22 1023 04/16/22 1202 04/16/22 1814 04/16/22 2207 04/17/22 0402 04/18/22 0752  WBC 3.2* 2.5*  --  2.5* 2.1* 2.4*  NEUTROABS  --  1.4*  --   --   --   --   HGB 8.5* 9.1* 11.4* 7.5* 7.8* 10.2*  HCT 24.7* 26.0* 31.6* 21.7* 22.5* 30.0*  MCV 94 92.9  --  94.3 94.5 97.7  PLT 80* 96*  --  82*  85* 791*   Basic Metabolic Panel: Recent Labs  Lab 04/16/22 1202 04/16/22 1814 04/17/22 0402 04/17/22 1044 04/17/22 2120 04/18/22 0546  NA 128* 131* 136 135  --  135  K 2.7* 2.5* 2.8* 4.1  --  3.4*  CL 88* 93* 102 100  --  100  CO2 _0 --  25  GLUCOSE 599* 228* 50* 145* 538* 214*  BUN 7 6 <5* <5*  --  5*  CREATININE 0.78 0.59* 0.48* 0.46*  --  0.61  CALCIUM 8.5* 8.3* 8.1* 8.3*  --  8.6*  MG 1.8  --   --   --   --   --   PHOS  --  3.3  --   --   --   --    Liver Function Tests: Recent Labs  Lab 04/15/22 1023 04/16/22 1202 04/17/22 0402  AST 115* 76* 52*  ALT 68* 67* 45*  ALKPHOS 672* 530* 359*  BILITOT 1.4* 1.7* 0.8  PROT 6.4 6.9 5.2*  ALBUMIN 4.1 4.0 2.9*   CBG: Recent Labs  Lab 04/18/22 0005 04/18/22 0415 04/18/22 0541 04/18/22 0721 04/18/22 1109  GLUCAP 376* 30* 171* 388* 329*    Discharge time spent: greater than 30 minutes.  Signed: Orson Eva, MD [facility] 04/18/2022

## 2022-04-18 NOTE — Assessment & Plan Note (Signed)
CT A/P with contrast -chronic calcific pancreatitis with pseudocyst at the pancreatic tail measuring 5.5 x 4.7 x 4.9 (decreased from prior).  Patient has pancreatic enzyme replacement on his med list.  His wife reports he has been noncompliant with this at home. Also reports poor nutrition which likely has resulted in his weight loss as his main consumption for the day is alcohol

## 2022-04-20 ENCOUNTER — Encounter (HOSPITAL_COMMUNITY): Payer: Self-pay | Admitting: Internal Medicine

## 2022-04-20 ENCOUNTER — Other Ambulatory Visit: Payer: Self-pay | Admitting: *Deleted

## 2022-04-20 NOTE — Patient Outreach (Signed)
  Care Coordination [facility] Note Transition Care Management Follow-up Telephone Call Date of discharge and from where: [phone] [facility] How have you been since you were released from the hospital? fine Any questions or concerns? No  Items Reviewed: Did the pt receive and understand the discharge instructions provided? Yes  Medications obtained and verified? Yes  Other? No  Any new allergies since your discharge? No  Dietary orders reviewed? No Do you have support at home? Yes   Home Care and Equipment/Supplies: Were home health services ordered? not applicable If so, what is the name of the agency? N/a  Has the agency set up a time to come to the patient's home? not applicable Were any new equipment or medical supplies ordered?  No What is the name of the medical supply agency? N Were you able to get the supplies/equipment? not applicable Do you have any questions related to the use of the equipment or supplies? No  Functional Questionnaire: (I = Independent and D = Dependent) ADLs: I  Bathing/Dressing- I  Meal Prep- I  Eating- I  Maintaining continence- I  Transferring/Ambulation- I  Managing Meds- I  Follow up appointments reviewed:  PCP Hospital f/u appt confirmed? No  Patient has an scheduled appointment a month away that was made before admission [facility] f/u appt confirmed? No  The Gastroenterologist will call patient with an appointment Are transportation arrangements needed? No  If their condition worsens, is the pt aware to call PCP or go to the Emergency Dept.? Yes Was the patient provided with contact information for the PCP's office or ED? Yes Was to pt encouraged to call back with questions or concerns? Yes  SDOH assessments and interventions completed:   Yes  Care Coordination Interventions Activated:  Yes Care Coordination Interventions:  PCP follow up appointment requested  Encounter Outcome:  Pt. Visit Completed

## 2022-04-20 NOTE — Patient Outreach (Signed)
  Care Coordination [facility] Note Transition Care Management Unsuccessful Follow-up Telephone Call  Date of discharge and from where:  [phone] [facility]  Attempts:  1st Attempt  Reason for unsuccessful TCM follow-up call:  Left voice message  [facility] [phone]

## 2022-04-21 LAB — CBC
HCT: 30 % — ABNORMAL LOW (ref 39.0–52.0)
Hemoglobin: 10.2 g/dL — ABNORMAL LOW (ref 13.0–17.0)
MCH: 33.2 pg (ref 26.0–34.0)
MCHC: 34 g/dL (ref 30.0–36.0)
MCV: 97.7 fL (ref 80.0–100.0)
Platelets: 143 10*3/uL — ABNORMAL LOW (ref 150–400)
RBC: 3.07 MIL/uL — ABNORMAL LOW (ref 4.22–5.81)
RDW: 12.4 % (ref 11.5–15.5)
WBC: 2.4 10*3/uL — ABNORMAL LOW (ref 4.0–10.5)
nRBC: 0 % (ref 0.0–0.2)

## 2022-04-26 NOTE — Progress Notes (Signed)
Chief Complaint  Patient presents with   Hospitalization Follow-up    Refills on all meds  [facility]    HPI: Todd Rodriguez 46 y.o. come in for post hospital  7 13 - 7 15  .23  For anemia slow gi bleed . Complication of excessive etoh use  etoh gastrictisi and goody powder use. . Had upper egd  no varices but ulceration?   Pancytopenia felt from etoh and dka bg 599   a1c 8.8  Last visit with me was 7 27/22  almost a year ( a numbero NS or cancellation.) Pepsi alcohol and beer. Are beverages   Loose stool on creon  Fraser Din denies current blood  wife says yes   Wife says gets jittery when gets  Wd sx n   no hx of seizure   says he is willing to truy to get off etoh.  Need handicaps form   from [place]   wife is driving  today . Uses cane hs of falling.   Says has been taking insulin tresiba 18 units  not yet today?  Is interested in dexcom  prev endo  wasn't able to get appoved  a while back  . Endo not seeing him for failure to show and fu appts.   Needs refill jardiance and  ativan uses with panic  ( etoh wd?)  know not to take with etoh.   Needsrx for his vits sent home with none?   ROS: See pertinent positives and negatives per HPI.  Past Medical History:  Diagnosis Date   Acute pancreatitis 10/25/2013   Arthritis    Diabetes mellitus without complication (HCC)    GERD (gastroesophageal reflux disease)    Hepatic steatosis    History of pancreatitis    Hypertension    Myalgia and myositis 04/21/2014   much better from illness presumed infectious    has disability form  will complete     Pancreatic pseudocyst     Family History  Problem Relation Age of Onset   Diabetes Mother    Hypertension Mother    Diabetes Father    Hypertension Father    Arthritis Maternal Grandmother    Hypertension Maternal Grandfather    Diabetes Maternal Grandfather    Colon cancer Neg Hx    Pancreatitis Neg Hx    Pancreatic cancer Neg Hx     Social History   Socioeconomic  History   Marital status: Married    Spouse name: Not on file   Number of children: 1   Years of education: Not on file   Highest education level: Not on file  Occupational History    Employer: [employer]  Tobacco Use   Smoking status: Every Day    Packs/day: 1.00    Years: 12.00    Total pack years: 12.00    Types: Cigarettes    Passive exposure: Current   Smokeless tobacco: Never  Vaping Use   Vaping Use: Never used  Substance and Sexual Activity   Alcohol use: Not Currently   Drug use: No   Sexual activity: Yes    Partners: Female    Comment: Patient's Wife  Other Topics Concern   Not on file  Social History Narrative   5 hours of sleep    2 people living in the home   A dog living in the home   [employer]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   Eye exam nany clark  FA tobacco some etoh neg rd exercises   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             Social Determinants of Health   Financial Resource Strain: Low Risk  (06/12/2021)   Overall Financial Resource Strain (CARDIA)    Difficulty of Paying Living Expenses: Not hard at all  Food Insecurity: No Food Insecurity (06/12/2021)   Hunger Vital Sign    Worried About Running Out of Food in the Last Year: Never true    Ran Out of Food in the Last Year: Never true  Transportation Needs: No Transportation Needs (06/12/2021)   PRAPARE - Hydrologist (Medical): No    Lack of Transportation (Non-Medical): No  Physical Activity: Insufficiently Active (05/29/2021)   Exercise Vital Sign    Days of Exercise per Week: 3 days    Minutes of Exercise per Session: 30 min  Stress: No Stress Concern Present (06/12/2021)   [place]    Feeling of Stress : Not at all  Social Connections: Daniel (05/29/2021)   Social Connection and Isolation Panel [NHANES]    Frequency of Communication with  Friends and Family: Three times a week    Frequency of Social Gatherings with Friends and Family: Three times a week    Attends Religious Services: More than 4 times per year    Active Member of Clubs or Organizations: Yes    Attends Archivist Meetings: More than 4 times per year    Marital Status: Married    Outpatient Medications Prior to Visit  Medication Sig Dispense Refill   albuterol (VENTOLIN HFA) 108 (90 Base) MCG/ACT inhaler Inhale 2 puffs into the lungs every 4 (four) hours as needed for wheezing or shortness of breath. 18 g 0   amlodipine-benazepril (LOTREL) 2.5-10 MG capsule Take 1 capsule by mouth daily. For high blood pressure .Dose adjustment 90 capsule 1   atorvastatin (LIPITOR) 40 MG tablet Take 1 tablet (40 mg total) by mouth daily. 90 tablet 1   Brimonidine Tartrate (LUMIFY) 0.025 % SOLN Place 1 drop into both eyes daily as needed (eye irritation/dry eyes.).     citalopram (CELEXA) 20 MG tablet Take 10 mg per day for 1-2 weeks then increase to 20 mg per day or as directed 90 tablet 1   Continuous Blood Gluc Sensor (DEXCOM G6 SENSOR) MISC 1 Device by Does not apply route as directed. 9 each 3   Continuous Blood Gluc Transmit (DEXCOM G6 TRANSMITTER) MISC 1 Device by Does not apply route as directed. 1 each 3   Cyanocobalamin (VITAMIN B-12 PO) Take 1 tablet by mouth daily.     gabapentin (NEURONTIN) 300 MG capsule Take 1 capsule (300 mg total) by mouth 3 (three) times daily. 90 capsule 3   glucose blood (ONE TOUCH TEST STRIPS) test strip Use as instructed 100 each 11   Insulin Pen Needle 32G X 4 MM MISC 1 Device by Does not apply route in the morning, at noon, in the evening, and at bedtime. 400 each 3   lipase/protease/amylase (CREON) 36000 UNITS CPEP capsule Take 1-2 capsules (36,000-72,000 Units total) by mouth See admin instructions. Take two with meals and one with snacks (total of 8 per day) 480 capsule 5   Multiple Vitamin (MULTIVITAMIN WITH MINERALS) TABS  tablet Take 1 tablet by mouth daily. 120 tablet 1   pantoprazole (PROTONIX) 40 MG tablet TAKE 1 TABLET BY  MOUTH EVERY DAY 90 tablet 1   TRESIBA FLEXTOUCH 100 UNIT/ML FlexTouch Pen Inject 18 Units into the skin daily. 15 mL 6   empagliflozin (JARDIANCE) 25 MG TABS tablet Take 1 tablet (25 mg total) by mouth daily. 30 tablet 0   folic acid (FOLVITE) 1 MG tablet Take 1 tablet (1 mg total) by mouth daily. 90 tablet 3   LORazepam (ATIVAN) 1 MG tablet Take 1 tablet (1 mg total) by mouth every 8 (eight) hours as needed for anxiety. Panic attacks 20 tablet 0   thiamine 100 MG tablet Take 1 tablet (100 mg total) by mouth daily. 30 tablet 3   No facility-administered medications prior to visit.     EXAM:  BP 122/76 (BP Location: Left Arm, Patient Position: Sitting, Cuff Size: Normal)   Pulse 64   Temp 98 F (36.7 C) (Oral)   Ht 6' 2"  (1.88 m)   Wt 133 lb 3.2 oz (60.4 kg)   SpO2 100%   BMI 17.10 kg/m   Body mass index is 17.1 kg/m.  GENERAL: vitals reviewed and listed above, alert, wasted chronically ill  appearing  oriented, appears  and in no acute distress HEENT: atraumatic, conjunctiva  clear, no obvious abnormalities on inspection of external nose and ears  NECK: no obvious masses on inspection palpation  LUNGS: clear to auscultation bilaterally, no wheezes, rales or rhonchi, good air movement CV: HRRR, no clubbing cyanosis slight   peripheral edema nl cap refill  Abd soft no masses felt  MS: moves all extremities walks with cane ( hx of cervical spondylosis  Skin no petechia  bruising  PSYCH:  alert oriented  good eye contact  Neuro no tremor today  ambulatory with cane  Lab Results  Component Value Date   WBC 2.4 (L) 04/18/2022   HGB 10.2 (L) 04/18/2022   HCT 30.0 (L) 04/18/2022   PLT 143 (L) 04/18/2022   GLUCOSE 214 (H) 04/18/2022   CHOL 160 12/23/2020   TRIG 80.0 12/23/2020   HDL 69.60 12/23/2020   LDLDIRECT 193.0 03/21/2019   LDLCALC 74 12/23/2020   ALT 45 (H)  04/17/2022   AST 52 (H) 04/17/2022   NA 135 04/18/2022   K 3.4 (L) 04/18/2022   CL 100 04/18/2022   CREATININE 0.61 04/18/2022   BUN 5 (L) 04/18/2022   CO2 25 04/18/2022   TSH 3.030 04/15/2022   PSA 1.17 12/23/2020   INR 1.1 04/16/2022   HGBA1C 8.8 (H) 04/16/2022   MICROALBUR <0.7 10/18/2019   BP Readings from Last 3 Encounters:  04/27/22 122/76  04/18/22 (!) 104/58  04/15/22 (!) 84/59    ASSESSMENT AND PLAN:  Discussed the following assessment and plan:  Anemia, unspecified type - gi loss [facility] a  egd etoh and nsaid gastritis  - Plan: POCT Glucose (Device for Home Use), CBC with Differential/Platelets, AMB Referral to [facility], CANCELED: CBC with Differential/Platelet, CANCELED: CBC with Differential/Platelet  Uncontrolled type 2 diabetes mellitus with hyperglycemia ([place]) - bg ok todayhx oif dka cuation ouse of jardiacne  cont insulin refer to ccm perhaps dexcom can help - Plan: CBC with Differential/Platelets, AMB Referral to [facility], CANCELED: CBC with Differential/Platelet, CANCELED: CBC with [facility] discharge follow-up  Alcohol abuse - Plan: CBC with Differential/Platelets, AMB Referral to [facility], CANCELED: CBC with Differential/Platelet, CANCELED: CBC with Differential/Platelet  Medication management - Plan: AMB Referral to Community [facility] Coordinaton  Essential hypertension, benign  Dyslipidemia  Pancreatic insufficiency  Pancytopenia ([place])  Post hospital for alcoholic and NSAID gastritis with anemia multiple failure to follow-up for various reasons. High risk history of DKA x2 although blood sugar good today he states when he takes his insulin He denies significant withdrawal but wife states he has jitteriness but no seizures.  We will refill his Ativan for "panic" but caution do not take this with alcohol. Discussed alcohol treatment detox etc.  Uncertain if he will move towards  this.  States he is willing. He has a follow-up with GI at the end of the month. Will rereferred to CCM for pharmacy help.  Perhaps prepackaging medications and possibility of getting a Dexcom monitor might be helpful.     [facility] Discussed alcohol withdrawal and plans needed. -Patient advised to return or notify health care team  if  new concerns arise.  Patient Instructions  I advise  to have you either see psychiatry for help with  alcoholism.  Or other  detox program    Most of your medical problems are coming from the alcohol effects on your obdy organs.   Alcohol makes depression worse.  Will have   chronic health team reach out to you to see if can help  with meds dexcom etc.   Plan follow up in  October 3 months     Standley Brooking. Denell Cothern M.D.

## 2022-04-27 ENCOUNTER — Encounter: Payer: Self-pay | Admitting: Internal Medicine

## 2022-04-27 ENCOUNTER — Ambulatory Visit: Payer: Medicare Other | Admitting: Internal Medicine

## 2022-04-27 VITALS — BP 122/76 | HR 64 | Temp 98.0°F | Ht 74.0 in | Wt 133.2 lb

## 2022-04-27 DIAGNOSIS — E1165 Type 2 diabetes mellitus with hyperglycemia: Secondary | ICD-10-CM

## 2022-04-27 DIAGNOSIS — Z79899 Other long term (current) drug therapy: Secondary | ICD-10-CM

## 2022-04-27 DIAGNOSIS — Z09 Encounter for follow-up examination after completed treatment for conditions other than malignant neoplasm: Secondary | ICD-10-CM | POA: Diagnosis not present

## 2022-04-27 DIAGNOSIS — D649 Anemia, unspecified: Secondary | ICD-10-CM

## 2022-04-27 DIAGNOSIS — I10 Essential (primary) hypertension: Secondary | ICD-10-CM

## 2022-04-27 DIAGNOSIS — K8689 Other specified diseases of pancreas: Secondary | ICD-10-CM

## 2022-04-27 DIAGNOSIS — F1010 Alcohol abuse, uncomplicated: Secondary | ICD-10-CM

## 2022-04-27 DIAGNOSIS — D61818 Other pancytopenia: Secondary | ICD-10-CM

## 2022-04-27 DIAGNOSIS — E785 Hyperlipidemia, unspecified: Secondary | ICD-10-CM

## 2022-04-27 LAB — POCT GLUCOSE (DEVICE FOR HOME USE): Glucose Fasting, POC: 144 mg/dL — AB (ref 70–99)

## 2022-04-27 MED ORDER — FOLIC ACID 1 MG PO TABS: 1.0000 mg | ORAL_TABLET | Freq: Every day | ORAL | 3 refills | Status: DC

## 2022-04-27 MED ORDER — EMPAGLIFLOZIN 25 MG PO TABS: 25.0000 mg | ORAL_TABLET | Freq: Every day | ORAL | 2 refills | Status: AC

## 2022-04-27 MED ORDER — LORAZEPAM 1 MG PO TABS: 1.0000 mg | ORAL_TABLET | Freq: Three times a day (TID) | ORAL | 1 refills | Status: DC | PRN

## 2022-04-27 MED ORDER — THIAMINE HCL 100 MG PO TABS
100.0000 mg | ORAL_TABLET | Freq: Every day | ORAL | 3 refills | Status: DC
Start: 2022-04-27 — End: 2023-11-17

## 2022-04-27 NOTE — Patient Instructions (Addendum)
I advise  to have you either see psychiatry for help with  alcoholism.  Or other  detox program    Most of your medical problems are coming from the alcohol effects on your obdy organs.   Alcohol makes depression worse.  Will have   chronic health team reach out to you to see if can help  with meds dexcom etc.   Plan follow up in  October 3 months

## 2022-04-28 ENCOUNTER — Telehealth: Payer: Self-pay | Admitting: Internal Medicine

## 2022-04-28 NOTE — Progress Notes (Signed)
  Care Management   Follow Up Note   04/28/2022 Name: Todd Rodriguez MRN: [id number] DOB: Sep 14, 1976   Referred by: Burnis Medin, MD Reason for referral : No chief complaint on file.   An unsuccessful telephone outreach was attempted today. The patient was referred to the case management team for assistance with care management and care coordination.   Follow Up Plan: The care management team will reach out to the patient again over the next 1 days.   [place]

## 2022-04-29 ENCOUNTER — Telehealth: Payer: Self-pay | Admitting: Internal Medicine

## 2022-04-29 NOTE — Progress Notes (Signed)
  Care Management   Follow Up Note   04/29/2022 Name: Todd Rodriguez MRN: [id number] DOB: 1975-11-22   Referred by: Burnis Medin, MD Reason for referral : No chief complaint on file.   A second unsuccessful telephone outreach was attempted today. The patient was referred to the case management team for assistance with care management and care coordination.   Follow Up Plan: The care management team will reach out to the patient again over the next 1 days.   [place]

## 2022-04-30 ENCOUNTER — Telehealth: Payer: Self-pay | Admitting: Internal Medicine

## 2022-04-30 NOTE — Progress Notes (Signed)
  Care Management   Follow Up Note   04/30/2022 Name: Todd Rodriguez MRN: [id number] DOB: 07-19-1976   Referred by: Burnis Medin, MD Reason for referral : No chief complaint on file.   Third unsuccessful telephone outreach was attempted today. The patient was referred to the case management team for assistance with care management and care coordination. The patient's primary care provider has been notified of our unsuccessful attempts to make or maintain contact with the patient. The care management team is pleased to engage with this patient at any time in the future should he/she be interested in assistance from the care management team.   Follow Up Plan: No further follow up required: 3rd attempt was made LVM and sent [place]

## 2022-05-04 ENCOUNTER — Telehealth: Payer: Self-pay | Admitting: Internal Medicine

## 2022-05-04 NOTE — Telephone Encounter (Signed)
Patient called back and scheduled him in 2 weeks for pharmacist visit. Plan to submit order for Dexcom prior to seeing patient pending response from insurance company about preferred DME.

## 2022-05-04 NOTE — Telephone Encounter (Signed)
Patient called asking for Clinical Pharmacist - M. Pryor to follow up on CGM.  Clinical Pharmacist unavailable.  Please call him back.

## 2022-05-08 ENCOUNTER — Other Ambulatory Visit: Payer: Medicare Other

## 2022-05-15 ENCOUNTER — Telehealth: Payer: Self-pay | Admitting: Pharmacist

## 2022-05-15 NOTE — Chronic Care Management (AMB) (Unsigned)
Chronic Care Management Pharmacy Assistant   Name: Todd Rodriguez  MRN: [id number] DOB: Oct 12, 1975  Todd Rodriguez is an 46 y.o. year old male who presents for his initial CCM visit with the clinical pharmacist.  Reason for Encounter: Chart prep for initial visit with Jeni Salles Clinical Pharmacist on 05/20/2022 at 11:00 in the office.    Conditions to be addressed/monitored: HTN, HLD, DMII, and GERD and chronic pancreatitis  Recent office visits:  04/27/2022 Shanon Ace MD - Patient was seen for Anemia, unspecified and additional issues. No medication changes. Follow up in 3 months.  05/29/2021 Randel Pigg LPN - Medicare annual wellness exam  Recent consult visits:  04/15/2022 Hurshel Keys DO (GI) -Patient was seen for Diarrhea, unspecified type and additional issues. No medication changes. Follow up in 6-8 weeks.   Hospital visits:  Patient was seen at [facility] ED on 05/08/2022 due to detox evaluation.   New?Medications Started at [facility] Discharge:?? No new medications Medication Changes at Hospital Discharge: No medication changes Medications Discontinued at Hospital Discharge: No medications discontinued Medications that remain the same after Hospital Discharge:??  -All other medications will remain the same.     Admitted to [facility] on 04/16/2022 due to heme positive stool. Discharge date was 04/18/2022.    New?Medications Started at [facility] Discharge:?? No new medications Medication Changes at Hospital Discharge: No medication changes Medications Discontinued at Hospital Discharge: dicyclomine 10 MG capsule (BENTYL) NovoLOG FlexPen 100 UNIT/ML FlexPen (insulin aspart) ondansetron 4 MG disintegrating tablet (Zofran ODT) Medications that remain the same after Hospital Discharge:??  -All other medications will remain the same.    Medications: Outpatient Encounter Medications as of 05/15/2022  Medication Sig   albuterol (VENTOLIN HFA)  108 (90 Base) MCG/ACT inhaler Inhale 2 puffs into the lungs every 4 (four) hours as needed for wheezing or shortness of breath.   amlodipine-benazepril (LOTREL) 2.5-10 MG capsule Take 1 capsule by mouth daily. For high blood pressure .Dose adjustment   atorvastatin (LIPITOR) 40 MG tablet Take 1 tablet (40 mg total) by mouth daily.   Brimonidine Tartrate (LUMIFY) 0.025 % SOLN Place 1 drop into both eyes daily as needed (eye irritation/dry eyes.).   citalopram (CELEXA) 20 MG tablet Take 10 mg per day for 1-2 weeks then increase to 20 mg per day or as directed   Continuous Blood Gluc Sensor (DEXCOM G6 SENSOR) MISC 1 Device by Does not apply route as directed.   Continuous Blood Gluc Transmit (DEXCOM G6 TRANSMITTER) MISC 1 Device by Does not apply route as directed.   Cyanocobalamin (VITAMIN B-12 PO) Take 1 tablet by mouth daily.   empagliflozin (JARDIANCE) 25 MG TABS tablet Take 1 tablet (25 mg total) by mouth daily.   folic acid (FOLVITE) 1 MG tablet Take 1 tablet (1 mg total) by mouth daily.   gabapentin (NEURONTIN) 300 MG capsule Take 1 capsule (300 mg total) by mouth 3 (three) times daily.   glucose blood (ONE TOUCH TEST STRIPS) test strip Use as instructed   Insulin Pen Needle 32G X 4 MM MISC 1 Device by Does not apply route in the morning, at noon, in the evening, and at bedtime.   lipase/protease/amylase (CREON) 36000 UNITS CPEP capsule Take 1-2 capsules (36,000-72,000 Units total) by mouth See admin instructions. Take two with meals and one with snacks (total of 8 per day)   LORazepam (ATIVAN) 1 MG tablet Take 1 tablet (1 mg total) by mouth every 8 (eight) hours as needed for anxiety. Panic  attacks   Multiple Vitamin (MULTIVITAMIN WITH MINERALS) TABS tablet Take 1 tablet by mouth daily.   pantoprazole (PROTONIX) 40 MG tablet TAKE 1 TABLET BY MOUTH EVERY DAY   thiamine 100 MG tablet Take 1 tablet (100 mg total) by mouth daily.   TRESIBA FLEXTOUCH 100 UNIT/ML FlexTouch Pen Inject 18 Units into  the skin daily.   No facility-administered encounter medications on file as of 05/15/2022.  Fill History: ALBUTEROL HFA 90 MCG INHALER 07/03/2021 16   AMLOD/BENAZP 2.5-10MG CAP 02/13/2022 90   ATORVASTATIN 40MG TAB 10/02/2021 90   JARDIANCE 25 MG TABLET 39/53/2023 30   FOLIC ACID 1 MG TABLET 04/27/2022 90   GABAPENTIN 300 MG CAPSULE 08/03/2021 30   TRESIBA FLEXTOUCH 100 UNIT/ML 04/17/2022 83   LORAZEPAM 1 MG TABLET 04/27/2022 7   PANTOPRAZOLE 40MG DR TAB 12/18/2021 90   Have you seen any other providers since your last visit? **{YES NO:22349}  Any changes in your medications or health? {YES NO:22349}  Any side effects from any medications? {YES NO:22349}  Do you have an symptoms or problems not managed by your medications? {YES NO:22349}  Any concerns about your health right now? {YES NO:22349}  Has your provider asked that you check blood pressure, blood sugar, or follow special diet at home? {YES NO:22349}  Do you get any type of exercise on a regular basis? {YES NO:22349}  Can you think of a goal you would like to reach for your health? ***  Do you have any problems getting your medications? {YES NO:22349}  Is there anything that you would like to discuss during the appointment? ***  Please bring medications and supplements to appointment   Care Gaps: AWV - completed 05/29/2021 Last BP - 122/76 on 04/27/2022 Last A1C - 8.3 on 05/09/2022 Tdap - overdue Foot exam - overdue Flu - due Covid booster - postponed  Star Rating Drugs: Amlodipine Benazepril 2.5/10 mg - last filled 02/13/2022 90 [facility] Atorvastatin 40 mg - last filled 06/02/2021 90 [facility] verified with Tameeka Jardiance 25 mg - last filled 04/15/2022 30 DS at [place] Pharmacist Assistant [phone]

## 2022-05-19 ENCOUNTER — Encounter: Payer: Medicare Other | Admitting: Internal Medicine

## 2022-05-19 ENCOUNTER — Telehealth: Payer: Self-pay | Admitting: Pharmacist

## 2022-05-19 NOTE — Chronic Care Management (AMB) (Signed)
    Chronic Care Management Pharmacy Assistant   Name: Amaurie Wandel  MRN: [id number] DOB: [birth year]  05/20/2022 APPOINTMENT REMINDER  Hillery Hunter was reminded to have all medications, supplements and any blood glucose and blood pressure readings available for review with Jeni Salles, Pharm. D, at his office visit on 0/16/2023 at 11:00.  Care Gaps: AWV - completed 05/29/2021 Last BP - 122/76 on 04/27/2022 Last A1C - 8.3 on 05/09/2022 Tdap - overdue Foot exam - overdue Flu - due Covid booster - postponed  Star Rating Drug: Amlodipine Benazepril 2.5/10 mg - last filled 02/13/2022 90 [facility] Atorvastatin 40 mg - last filled 06/02/2021 90 [facility] verified with Tameeka Jardiance 25 mg - last filled 04/15/2022 30 [facility]  Any gaps in medications fill history? No  [facility] [place]  Catering manager [phone]

## 2022-05-20 ENCOUNTER — Ambulatory Visit (INDEPENDENT_AMBULATORY_CARE_PROVIDER_SITE_OTHER): Payer: Medicare Other | Admitting: Pharmacist

## 2022-05-20 DIAGNOSIS — I10 Essential (primary) hypertension: Secondary | ICD-10-CM

## 2022-05-20 DIAGNOSIS — E1165 Type 2 diabetes mellitus with hyperglycemia: Secondary | ICD-10-CM

## 2022-05-20 NOTE — Progress Notes (Signed)
Chronic Care Management Pharmacy Note  06/04/2022 Name:  Todd Rodriguez MRN:  [id number] DOB:  March 12, 1976  Summary: A1c not at goal < 7% Pt has difficulty keeping up with medications and changes Pt is not taking medications as prescribed  Recommendations/Changes made from today's visit: -Recommended switching Tresiba to 32 units once daily -Recommended CGM monitoring with Dexcom and provided sample -Recommended only taking amlodipine per discharge instructions for BP medications  Plan: Follow up in 2-3 weeks for insulin adjustments   Subjective: Todd Rodriguez is an 46 y.o. year old male who is a primary patient of Panosh, Standley Brooking, MD.  The CCM team was consulted for assistance with disease management and care coordination needs.    Engaged with patient face to face for initial visit in response to provider referral for pharmacy case management and/or care coordination services.   Consent to Services:  The patient was given the following information about Chronic Care Management services today, agreed to services, and gave verbal consent: 1. CCM service includes personalized support from designated clinical staff supervised by the primary care provider, including individualized plan of care and coordination with other care providers 2. 24/7 contact phone numbers for assistance for urgent and routine care needs. 3. Service will only be billed when office clinical staff spend 20 minutes or more in a month to coordinate care. 4. Only one practitioner may furnish and bill the service in a calendar month. 5.The patient may stop CCM services at any time (effective at the end of the month) by phone call to the office staff. 6. The patient will be responsible for cost sharing (co-pay) of up to 20% of the service fee (after annual deductible is met). Patient agreed to services and consent obtained.  Patient Care Team: Panosh, Standley Brooking, MD as PCP - General (Internal Medicine) Westly Pam as Physician Assistant (Gastroenterology) Alda Berthold, DO as Consulting Physician (Neurology) [facility], Melanie Crazier, MD as Consulting Physician (Endocrinology) Eloise Harman, DO as Consulting Physician (Internal Medicine)  Recent office visits: 04/27/2022 Shanon Ace MD - Patient was seen for Anemia, unspecified and additional concerns. No medication changes. Follow up in 3 months.   05/29/2021 Randel Pigg LPN - Medicare annual wellness exam.  Recent consult visits: 04/15/2022 Hurshel Keys DO (GI) -Patient was seen for Diarrhea, unspecified type and additional issues. No medication changes. Follow up in 6-8 weeks.   Hospital visits: Patient was seen at [facility] ED on 05/08/2022 due to detox evaluation.   New?Medications Started at [facility] Discharge:?? No new medications Medication Changes at Hospital Discharge: No medication changes Medications Discontinued at Hospital Discharge: No medications discontinued Medications that remain the same after Hospital Discharge:??  -All other medications will remain the same.       Admitted to [facility] on 04/16/2022 due to heme positive stool. Discharge date was 04/18/2022.    New?Medications Started at [facility] Discharge:?? No new medications Medication Changes at Hospital Discharge: No medication changes Medications Discontinued at Hospital Discharge: dicyclomine 10 MG capsule (BENTYL) NovoLOG FlexPen 100 UNIT/ML FlexPen (insulin aspart) ondansetron 4 MG disintegrating tablet (Zofran ODT) Medications that remain the same after Hospital Discharge:??  -All other medications will remain the same.     Objective:  Lab Results  Component Value Date   CREATININE 0.61 04/18/2022   BUN 5 (L) 04/18/2022   GFR 128.39 04/30/2021   EGFR 106 04/15/2022   GFRNONAA >60 04/18/2022   GFRAA >60 05/10/2020   NA 135 04/18/2022  K 3.4 (L) 04/18/2022   CALCIUM 8.6 (L) 04/18/2022   CO2 25 04/18/2022   GLUCOSE  214 (H) 04/18/2022    Lab Results  Component Value Date/Time   HGBA1C 8.8 (H) 04/16/2022 06:14 PM   HGBA1C 8.3 (H) 07/02/2021 07:26 AM   GFR 128.39 04/30/2021 11:43 AM   GFR 113.33 12/23/2020 02:41 PM   MICROALBUR <0.7 10/18/2019 10:51 AM   MICROALBUR 2.4 (H) 03/21/2019 11:04 AM    Last diabetic Eye exam: No results found for: "HMDIABEYEEXA"  Last diabetic Foot exam: No results found for: "HMDIABFOOTEX"   Lab Results  Component Value Date   CHOL 160 12/23/2020   HDL 69.60 12/23/2020   LDLCALC 74 12/23/2020   LDLDIRECT 193.0 03/21/2019   TRIG 80.0 12/23/2020   CHOLHDL 2 12/23/2020       Latest Ref Rng & Units 04/17/2022    4:02 AM 04/16/2022   12:02 PM 04/15/2022   10:23 AM  Hepatic Function  Total Protein 6.5 - 8.1 g/dL 5.2  6.9  6.4   Albumin 3.5 - 5.0 g/dL 2.9  4.0  4.1   AST 15 - 41 U/L 52  76  115   ALT 0 - 44 U/L 45  67  68   Alk Phosphatase 38 - 126 U/L 359  530  672   Total Bilirubin 0.3 - 1.2 mg/dL 0.8  1.7  1.4     Lab Results  Component Value Date/Time   TSH 3.030 04/15/2022 10:23 AM   TSH 1.68 04/30/2021 11:43 AM   FREET4 1.02 10/18/2019 10:51 AM   FREET4 0.68 07/19/2018 03:37 PM       Latest Ref Rng & Units 04/18/2022    7:52 AM 04/17/2022    4:02 AM 04/16/2022   10:07 PM  CBC  WBC 4.0 - 10.5 K/uL 2.4  2.1  2.5   Hemoglobin 13.0 - 17.0 g/dL 10.2  7.8  7.5   Hematocrit 39.0 - 52.0 % 30.0  22.5  21.7   Platelets 150 - 400 K/uL 143  85  82     No results found for: "VD25OH"  Clinical ASCVD: No  The ASCVD Risk score (Arnett DK, et al., 2019) failed to calculate for the following reasons:   The valid HDL cholesterol range is 20 to 100 mg/dL       04/27/2022    8:42 AM 06/12/2021   11:18 AM 05/29/2021    3:34 PM  Depression screen PHQ 2/9  Decreased Interest 3 0 0  Down, Depressed, Hopeless 3 0 0  PHQ - 2 Score 6 0 0  Altered sleeping 3    Tired, decreased energy 3    Change in appetite 3    Feeling bad or failure about yourself  3    Trouble  concentrating 1    Moving slowly or fidgety/restless 3    Suicidal thoughts 0    PHQ-9 Score 22    Difficult doing work/chores Extremely dIfficult        Social History   Tobacco Use  Smoking Status Every Day   Packs/day: 1.00   Years: 12.00   Total pack years: 12.00   Types: Cigarettes   Passive exposure: Current  Smokeless Tobacco Never   BP Readings from Last 3 Encounters:  04/27/22 122/76  04/18/22 (!) 104/58  04/15/22 (!) 84/59   Pulse Readings from Last 3 Encounters:  04/27/22 64  04/18/22 (!) 55  04/15/22 98   Wt Readings from Last 3 Encounters:  04/27/22 133 lb 3.2 oz (60.4 kg)  04/17/22 143 lb 4.8 oz (65 kg)  04/15/22 126 lb (57.2 kg)   BMI Readings from Last 3 Encounters:  04/27/22 17.10 kg/m  04/17/22 18.40 kg/m  04/15/22 16.18 kg/m    Assessment/Interventions: Review of patient past medical history, allergies, medications, health status, including review of consultants reports, laboratory and other test data, was performed as part of comprehensive evaluation and provision of chronic care management services.   SDOH:  (Social Determinants of Health) assessments and interventions performed: Yes SDOH Interventions    Flowsheet Row Most Recent Value  SDOH Interventions   Financial Strain Interventions Intervention Not Indicated  Transportation Interventions Intervention Not Indicated      SDOH Screenings   Alcohol Screen: Low Risk  (05/29/2021)   Alcohol Screen    Last Alcohol Screening Score (AUDIT): 0  Depression (PHQ2-9): High Risk (04/27/2022)   Depression (PHQ2-9)    PHQ-2 Score: 22  Financial Resource Strain: Low Risk  (06/02/2022)   Overall Financial Resource Strain (CARDIA)    Difficulty of Paying Living Expenses: Not very hard  Food Insecurity: No Food Insecurity (06/12/2021)   Hunger Vital Sign    Worried About Running Out of Food in the Last Year: Never true    Ran Out of Food in the Last Year: Never true  Housing: Low Risk   (06/12/2021)   Housing    Last Housing Risk Score: 0  Physical Activity: Insufficiently Active (05/29/2021)   Exercise Vital Sign    Days of Exercise per Week: 3 days    Minutes of Exercise per Session: 30 min  Social Connections: Socially Integrated (05/29/2021)   Social Connection and Isolation Panel [NHANES]    Frequency of Communication with Friends and Family: Three times a week    Frequency of Social Gatherings with Friends and Family: Three times a week    Attends Religious Services: More than 4 times per year    Active Member of Clubs or Organizations: Yes    Attends Archivist Meetings: More than 4 times per year    Marital Status: Married  Stress: No Stress Concern Present (06/12/2021)   [place]    Feeling of Stress : Not at all  Tobacco Use: High Risk (04/27/2022)   Patient History    Smoking Tobacco Use: Every Day    Smokeless Tobacco Use: Never    Passive Exposure: Current  Transportation Needs: No Transportation Needs (06/02/2022)   PRAPARE - Transportation    Lack of Transportation (Medical): No    Lack of Transportation (Non-Medical): No   Patient brought his medication list from the detox center and made updates to his medication list. He was unaware of some of the changes, such as to his blood pressure medications.  Patient is mostly concerned with his blood sugars right now. He has been unable to get them regulated and is not checking consistently. Patient is very interested in [place].  Patient denies any exercise at this time but is trying to regain his muscle and weight. He lost a lot of weight after a throat surgery back in 2001 (lost about 100 lbs) and this took a huge toll on him. He is trying to use ankle weights right now and his appetite is high. He said he picked up weight while in the detox program.  Patient lives with his wife and they mostly cook at home now. He was eating out  more  often but now they both alternate with who does the cooking. They have 3 freezers so they can meal prep.  CCM Care Plan  No Known Allergies  Medications Reviewed Today     Reviewed by Pearlean Brownie, CMA (Certified Medical Assistant) on 04/27/22 at [phone]  Med List Status: <None>   Medication Order Taking? Sig Documenting Provider Last Dose Status Informant  albuterol (VENTOLIN HFA) 108 (90 Base) MCG/ACT inhaler [id number] Yes Inhale 2 puffs into the lungs every 4 (four) hours as needed for wheezing or shortness of breath. Roxan Hockey, MD Taking Active Self  amlodipine-benazepril (LOTREL) 2.5-10 MG capsule [id number] Yes Take 1 capsule by mouth daily. For high blood pressure .Dose adjustment Roxan Hockey, MD Taking Active Self  atorvastatin (LIPITOR) 40 MG tablet [id number] Yes Take 1 tablet (40 mg total) by mouth daily. Roxan Hockey, MD Taking Active Self  Brimonidine Tartrate (LUMIFY) 0.025 % SOLN [id number] Yes Place 1 drop into both eyes daily as needed (eye irritation/dry eyes.). [provider] Taking Active Self  citalopram (CELEXA) 20 MG tablet [id number] Yes Take 10 mg per day for 1-2 weeks then increase to 20 mg per day or as directed Roxan Hockey, MD Taking Active Self  Continuous Blood Gluc Sensor (DEXCOM G6 SENSOR) MISC [id number] Yes 1 Device by Does not apply route as directed. Roxan Hockey, MD Taking Active Self  Continuous Blood Gluc Transmit (DEXCOM G6 TRANSMITTER) MISC [id number] Yes 1 Device by Does not apply route as directed. Roxan Hockey, MD Taking Active Self  Cyanocobalamin (VITAMIN B-12 PO) [id number] Yes Take 1 tablet by mouth daily. [provider] Taking Active Self  empagliflozin (JARDIANCE) 25 MG TABS tablet [id number] Yes Take 1 tablet (25 mg total) by mouth daily. Panosh, Standley Brooking, MD Taking Active Self  folic acid (FOLVITE) 1 MG tablet [id number] Yes Take 1 tablet (1 mg total) by mouth daily. Roxan Hockey, MD Taking Active  Self  gabapentin (NEURONTIN) 300 MG capsule [id number] Yes Take 1 capsule (300 mg total) by mouth 3 (three) times daily. Roxan Hockey, MD Taking Active Self  glucose blood (ONE TOUCH TEST STRIPS) test strip [id number] Yes Use as instructed Emokpae, Courage, MD Taking Active Self  Insulin Pen Needle 32G X 4 MM MISC [id number] Yes 1 Device by Does not apply route in the morning, at noon, in the evening, and at bedtime. Roxan Hockey, MD Taking Active Self  lipase/protease/amylase (CREON) 36000 UNITS CPEP capsule [id number] Yes Take 1-2 capsules (36,000-72,000 Units total) by mouth See admin instructions. Take two with meals and one with snacks (total of 8 per day) Roxan Hockey, MD Taking Active Self  LORazepam (ATIVAN) 1 MG tablet [id number] Yes Take 1 tablet (1 mg total) by mouth every 8 (eight) hours as needed for anxiety. Panic attacks Panosh, Standley Brooking, MD Taking Active Self  Multiple Vitamin (MULTIVITAMIN WITH MINERALS) TABS tablet [id number] Yes Take 1 tablet by mouth daily. Roxan Hockey, MD Taking Active Self  pantoprazole (PROTONIX) 40 MG tablet [id number] Yes TAKE 1 TABLET BY MOUTH EVERY DAY Eloise Harman, DO Taking Active Self  thiamine 100 MG tablet [id number] Yes Take 1 tablet (100 mg total) by mouth daily. Roxan Hockey, MD Taking Active Self  TRESIBA FLEXTOUCH 100 UNIT/ML FlexTouch Pen [id number] Yes Inject 18 Units into the skin daily. Roxan Hockey, MD Taking Active Self            Patient Active Problem List   Diagnosis Date Noted   Heme positive stool 04/17/2022   Uncontrolled  type 2 diabetes mellitus with hyperglycemia, with long-term current use of insulin ([place]) 04/17/2022   Pancytopenia ([place]) 04/16/2022   Hypokalemia 04/16/2022   DKA, type 2 ([place]) 07/01/2021   DKA (diabetic ketoacidosis) ([place]) 06/30/2021   Hyponatremia 06/30/2021   Hypothermia 06/30/2021   COVID-19 virus infection 06/30/2021   Hyperkalemia 06/30/2021   AKI (acute kidney injury) ([place])  06/30/2021   Nausea & vomiting 06/30/2021   Failure to thrive in adult 06/30/2021   Starvation ketoacidosis 06/30/2021   Tobacco abuse 06/30/2021   Loose stools 08/27/2020   Abnormal weight loss 04/26/2020   Rectal bleeding 04/26/2020   Pancreatic pseudocyst    Protein-calorie malnutrition, severe 05/29/2019   High anion gap metabolic acidosis 24/23/5361   Alcohol abuse 05/28/2019   Thrombocytopenia ([place]) 05/28/2019   Pancreatic cyst 05/28/2019   Hepatic lesion 05/28/2019   Dyslipidemia 05/11/2019   Abnormal LFTs 09/12/2018   Medication management 09/12/2018   S/P cervical spinal fusion 01/12/2018   Stenosis of cervical spine with myelopathy ([place]) 01/03/2018   Carpal tunnel syndrome of left wrist 12/10/2017   Cervical radiculopathy 11/11/2017   History of fusion of cervical spine 10/22/2017   Chronic pancreatitis ([place]) 10/28/2015   Cramps, extremity 06/25/2015   Diabetes mellitus type 2, uncontrolled, without complications 44/31/5400   Hand cramps 01/25/2015   Fever, unspecified 03/26/2014   Hx of acute pancreatitis 01/03/2014   Tobacco dependence 01/03/2014   Other and unspecified hyperlipidemia 01/03/2014   GERD (gastroesophageal reflux disease) 11/22/2013   Essential hypertension, benign 10/25/2013   Leukocytosis, unspecified 10/25/2013    Immunization History  Administered Date(s) Administered   Pneumococcal Conjugate-13 01/15/2016   Pneumococcal Polysaccharide-23 09/12/2018    Conditions to be addressed/monitored:  Hypertension, Hyperlipidemia, Diabetes, GERD, Depression, Anxiety, and Tobacco use  Care Plan : [place]  Updates made by Viona Gilmore, [place] since 06/04/2022 12:00 AM     Problem: Problem: Hypertension, Hyperlipidemia, Diabetes, GERD, Depression, Anxiety, and Tobacco use      Long-Range Goal: Patient-Specific Goal   Start Date: 05/20/2022  Expected End Date:   This Visit's Progress: On track  Priority: High  Note:    Current Barriers:  Unable to independently monitor therapeutic efficacy Unable to achieve control of diabetes   Pharmacist Clinical Goal(s):  Patient will achieve adherence to monitoring guidelines and medication adherence to achieve therapeutic efficacy achieve control of diabetes as evidenced by A1c  through collaboration with PharmD and provider.   Interventions: 1:1 collaboration with Panosh, Standley Brooking, MD regarding development and update of comprehensive plan of care as evidenced by provider attestation and co-signature Inter-disciplinary care team collaboration (see longitudinal plan of care) Comprehensive medication review performed; medication list updated in electronic medical record  Hypertension (BP goal <130/80) -Uncontrolled (too low) -Current treatment: Amlodipine-benazepril 2.5-10 mg 1 capsule daily - Appropriate, Query effective, Query Safe, Accessible -Medications previously tried: n/a  -Current home readings: 120/74, highest 178/80 (2 days ago) - checking daily and machine has a memory but he did not write down the readings -Current dietary habits: trying to limit salt intake -Current exercise habits: none -Denies hypotensive/hypertensive symptoms -Educated on BP goals and benefits of medications for prevention of heart attack, stroke and kidney damage; Importance of home blood pressure monitoring; Proper BP monitoring technique; -Counseled to monitor BP at home a few days a week, document, and provide log at future appointments -Recommended switching to amlodipine only per discharge instructions.  Hyperlipidemia: (LDL goal < 70) -Uncontrolled -Current treatment: Atorvastatin  40 mg 1  tablet daily - not filled since last year - Appropriate, Query effective, Safe, Query accessible -Medications previously tried: none  -Current dietary patterns: working on diet -Current exercise habits: none -Educated on Cholesterol goals;  Benefits of statin for ASCVD risk  reduction; Importance of limiting foods high in cholesterol; -Recommended restarting atorvastatin.  Diabetes (A1c goal <7%) -Uncontrolled -Current medications: Jardiance 25 mg 1 tablet daily - Appropriate, Query effective, Safe, Accessible Tresiba inject 18 units daily in the morning, 18 at bedtime  - Appropriate, Query effective, Safe, Accessible Novolog 6 units three times daily - Appropriate, Query effective, Safe, Accessible -Medications previously tried: n/a  -Current home glucose readings fasting glucose: not checking post prandial glucose: not checking -Denies hypoglycemic/hyperglycemic symptoms -Current meal patterns:  breakfast: bowl of cereal, eggs and cheese or sandwich with jelly, boost with each meal  lunch: ham sandwich, left overs (peas, corn), greens every day, broccoli dinner: baked chicken, rice, peas and corn snacks: bugles, peanut butter & jelly sandwich, jolly ranchers drinks: boost, diet soda, plenty of water with crystal lite in water - Yeti 30 ounces 3-4 times, boost with every meal  -Current exercise: none right now -Educated on A1c and blood sugar goals; Complications of diabetes including kidney damage, retinal damage, and cardiovascular disease; Exercise goal of 150 minutes per week; Benefits of routine self-monitoring of blood sugar; Continuous glucose monitoring; -Counseled to check feet daily and get yearly eye exams -Recommended switching to [place] 34 units once daily.  Depression/Anxiety (Goal: minimize symptoms) -Uncontrolled -Current treatment: Citalopram 20 mg 1 tablet daily - Appropriate, Query effective, Safe, Accessible Lorazepam 1 mg 1 tablet as needed for panic attacks - Query Appropriate, Query effective, Query Safe, Accessible -Medications previously tried/failed: n/a -PHQ9: 93 -GAD7: n/a -Educated on Benefits of medication for symptom control Benefits of cognitive-behavioral therapy with or without medication -Recommended to  continue current medication  Tobacco use (Goal quit smoking) -Uncontrolled -Previous quit attempts: unknown -Current treatment  No medications -Patient smokes Within 30 minutes of waking -Patient triggers include:  habit -On a scale of 1-10, reports MOTIVATION to quit is 1 -On a scale of 1-10, reports CONFIDENCE in quitting is 1 -Provided contact information for [place] Quit Line (1-800-QUIT-NOW) and encouraged patient to reach out to this group for support. - Will assess at next visit.  Alcohol abuse (Goal: minimize symptoms of withdrawal) -Controlled -Current treatment  Thiamine 100 mg 2 tablets twice daily - Appropriate, Effective, Safe, Accessible Acamprosate 333 mg 2 tablets three times daily - Appropriate, Effective, Safe, Accessible -Medications previously tried: none  -Recommended to continue current medication  GERD/Digestion (Goal: minimize symptoms) -Not ideally controlled -Current treatment  Pantoprazole 40 mg 1 tablet daily - Appropriate, Effective, Safe, Accessible Creon 36000 units three times daily with meals  - Appropriate, Query effective, Safe, Accessible -Medications previously tried: n/a  -Recommended to continue current medication  Neuropathy (Goal: minimize symptoms) -Uncontrolled -Current treatment  Gabapentin 300 mg 1 tablet three times daily - Query Appropriate, Query effective, Safe, Accessible -Medications previously tried: n/a  - Patient is unsure why he is on this medication.  Insomnia (Goal: improve quality and quantity of sleep) -Controlled -Current treatment  Trazodone 50 mg 1 tablet at bedtime - Appropriate, Effective, Safe, Accessible -Medications previously tried: none  -Recommended to continue current medication   Health Maintenance -Vaccine gaps: tetanus, influenza, COVID -Current therapy:  Folic acid 1 mg 1 tablet daily Multivitamin 1 tablet daily Vitamin B12 1 tablet daily Lumify 0.025% as needed Albuterol HFA as  needed  Furosemide 20 mg 1 tablet daily Vitamin B1 100 mg daily Nephrovite daily -Educated on Herbal supplement research is limited and benefits usually cannot be proven Cost vs benefit of each product must be carefully weighed by individual consumer -Patient is satisfied with current therapy and denies issues -Recommended to continue current medication  Patient Goals/Self-Care Activities Patient will:  - take medications as prescribed as evidenced by patient report and record review check glucose daily, document, and provide at future appointments check blood pressure every other day, document, and provide at future appointments  Follow Up Plan: The care management team will reach out to the patient again over the next 30 days.         Medication Assistance: None required.  Patient affirms current coverage meets needs.  Compliance/Adherence/Medication fill history: Care Gaps: Tetanus, Foot exam, influenza, COVID Last BP - 122/76 on 04/27/2022 Last A1C - 8.3 on 05/09/2022  Star-Rating Drugs: Amlodipine Benazepril 2.5/10 mg - last filled [facility]/13/2022 [facility] Atorvastatin 40 mg - last filled [facility]/02/2021 [facility] verified with [facility] Jardiance 25 mg - last filled [facility]/15/2022 [facility]  [facility]  [id number]Phone: [phone]: [phone] [facility] (New Address) - [place] Previously: [address] [place] [address] [id number]: [phone]: [phone] Uses pill box? No - feels like he doesn't need it Pt endorses 80% compliance  We discussed: Benefits of medication synchronization, packaging and delivery as well as enhanced pharmacist oversight with [employer]. Patient decided to: Continue current  medication management strategy  Care Plan and Follow Up Patient Decision:  Patient agrees to Care Plan and Follow-up.  Plan: The care management team will reach out to the patient again over the next 30 days.  MJeni Salles PharmD, BOostburgPharmacist [place]

## 2022-05-20 NOTE — Patient Instructions (Addendum)
Call [facility] [phone] to get your Dexcom Move your Tyler Aas to 34 units all at once in the morning Look for amlodipine 5 mg at the pharmacy Call [phone] for the Quit line for nicotine patches  Maddie Jeni Salles, PharmD, [facility]  Visit Information   Goals Addressed   None    Patient Care Plan: Diabetes Type 2 (Adult)  Completed 08/14/2021   Problem Identified: Lack of long term self management of Type 2 Diabetes Resolved 08/14/2021  Priority: High     Long-Range Goal: Effective long term self management of Type 2 Diabetes Completed 08/14/2021  Start Date: 06/12/2021  Expected End Date: 12/03/2021  Recent Progress: On track  Priority: High  Note:   Resolving due to duplicate goal  Objective:  Lab Results  Component Value Date   HGBA1C 10.9 (H) 04/30/2021   Lab Results  Component Value Date   CREATININE 0.42 04/30/2021   CREATININE 0.64 12/23/2020   CREATININE 0.59 (L) 05/10/2020   No results found for: EGFR Current Barriers:  Knowledge Deficits related to basic Diabetes pathophysiology and self care/management Knowledge Deficits related to medications used for management of diabetes Unable to independently self manage Type 2 Diabetes Does not adhere to provider recommendations re: diet Pt was admitted to hospital 06/30/21-07/03/21 for DKA and was COVID positive. States he has not missed taking his meal time insulin and the [place] since he came home from the hospital.  States he is checking his CBG twice a day States that his CBG readings have ranged from 130-200.  Denies any hypoglycemia.  States he is trying to eat less bread and more protein.  States he is drinking 2 Ensures a day to try to gain some of his weight back.  States he has not drank any alcohol since he came home. States he had a telephone visit yesterday with his GI doctor for his loose stools.  States he is going to get labs  done when he is out of quarantine next week.  States he has completed his Paxlovid    Case Manager Clinical Goal(s):  patient will demonstrate improved adherence to prescribed treatment plan for diabetes self care/management as evidenced by: daily monitoring and recording of CBG  adherence to ADA/ carb modified diet adherence to prescribed medication regimen contacting provider for new or worsened symptoms or questions Interventions:  Collaboration with Panosh, Standley Brooking, MD regarding development and update of comprehensive plan of care as evidenced by provider attestation and co-signature Inter-disciplinary care team collaboration (see longitudinal plan of care) Reviewed education to patient about basic DM disease process Reviewed medications with patient and discussed importance of medication adherence Discussed plans with patient for ongoing care management follow up and provided patient with direct contact information for care management team Reviewed s/sx of hypoglycemia and hyperglycemia and importance of correct treatment Reviewed scheduled/upcoming provider appointments including:  Dr. Kelton Pillar 08/22/21, Dr. Regis Bill 08/25/21 Reinforced to check cbg twice a day and record, calling provider for findings outside established parameters.   Review of patient status, including review of consultants reports, relevant laboratory and other test results, and medications completed. Reviewed importance of taking his insulin as ordered and risks if he goes without insulin Reviewed to drink adequate amounts of water and non sugar sweetened beverages Reviewed to availability of treatment programs such as AA but declines at this time Reinforced to try drinking diabetic nutritional supplements to help gain weight Reviewed to avoid drinking any alcohol or  sugar sweetened drinks  Self-Care Activities - Self administers oral medications as prescribed Self administers insulin as prescribed Attends all  scheduled provider appointments Checks blood sugars as prescribed and utilize hyper and hypoglycemia protocol as needed Adheres to prescribed ADA/carb modified Patient Goals: - check blood sugar at prescribed times - check blood sugar if I feel it is too high or too low - enter blood sugar readings and medication or insulin into daily log - take the blood sugar log to all doctor visits - take the blood sugar meter to all doctor visits - change to whole grain breads, cereal, pasta - drink 6 to 8 glasses of water each day - fill half of plate with vegetables - limit fast food meals to no more than 1 per week - manage portion size - prepare main meal at home 3 to 5 days each week - read food labels for fat, fiber, carbohydrates and portion size - switch to low-fat or skim milk - switch to sugar-free drinks - keep appointment with eye doctor - check feet daily for cuts, sores or redness - keep feet up while sitting - trim toenails straight across - wash and dry feet carefully every day - wear comfortable, cotton socks - wear comfortable, well-fitting shoes -Switch to diabetic nutritional supplements Follow Up Plan: Telephone follow up appointment with care management team member scheduled for: 08/14/21 at 3:30 PM The patient has been provided with contact information for the care management team and has been advised to call with any health related questions or concerns.      Patient Care Plan: Cardiovascular disease  (HTN and HLD)  Completed 08/14/2021   Problem Identified: Need for self management of Cardiovascular disease  (HTN and HLD) Resolved 08/14/2021  Priority: Medium     Long-Range Goal: Effective self managment of Cardiovascular disease (HTN and HLD) Completed 08/14/2021  Start Date: 06/12/2021  Expected End Date: 12/03/2021  Recent Progress: On track  Priority: Medium  Note:   Resolving due to duplicate goal  Current Barriers:  Knowledge Deficits related to basic  understanding of Cardiovascular disease (HTN and HLD) pathophysiology and self care management Unable to independently self manage Cardiovascular disease (HTN and HLD) States he has been checking his B/P every day since he came home from the hospital.  States it was 120/80 today.  States he does not add salt to his food and tries to avoid foods with more salt Nurse Case Manager Clinical Goal(s):  patient will verbalize understanding of plan for Cardiovascular disease (HTN and HLD) management patient will not experience hospital admission. Hospital Admissions in last 6 months = 2 patient will attend all scheduled medical appointments:  Dr. Kelton Pillar 08/22/21, Dr. Regis Bill 08/25/21 patient will demonstrate improved adherence to prescribed treatment plan for hypertension as evidenced by taking all medications as prescribed, monitoring and recording blood pressure as directed, adhering to low sodium/DASH diet patient will demonstrate improved health management independence as evidenced by checking blood pressure as directed and notifying PCP if SBP>160 or DBP > 90, taking all medications as prescribe, and adhering to a low sodium diet as discussed. patient will verbalize basic understanding of Cardiovascular disease (HTN and HLD) disease process and self health management plan as evidenced by readings within limits, adherence to plan of care Interventions:  Collaboration with Panosh, Standley Brooking, MD regarding development and update of comprehensive plan of care as evidenced by provider attestation and co-signature Inter-disciplinary care team collaboration (see longitudinal plan of care) Evaluation of current treatment  plan related to hypertension self management and patient's adherence to plan as established by provider. Reinforced education to patient re: stroke prevention, s/s of heart attack and stroke, DASH diet, complications of uncontrolled blood pressure Reviewed medications with patient and discussed  importance of compliance Discussed plans with patient for ongoing care management follow up and provided patient with direct contact information for care management team Reinforced to monitor blood pressure daily and record, calling PCP for findings outside established parameters.  Reviewed scheduled/upcoming provider appointments including: Dr. Kelton Pillar 08/22/21, Dr. Regis Bill 08/25/21 Reviewed to avoid saturated fats, trans-fats and eat more fiber Reviewed to take statin as ordered Reviewed to lower fatty foods, red meat, cheese, milk and increase fiber like whole grains and veggies. Reviewed to try to cut back or stop smoking Self-Care Activities:  Patient verbalizes understanding of plan to self manage Cardiovascular disease (HTN and HLD)  Self administers medications as prescribed Attends all scheduled provider appointments Calls pharmacy for medication refills Calls provider office for new concerns or questions Patient Goals  - Self administer medications as prescribed  - Attend all scheduled provider appointments  - Call provider office for new concerns, questions, or BP outside discussed parameters  - Check BP and record as discussed  - Follows a low sodium diet/DASH diet - check blood pressure daily - choose a place to take my blood pressure (home, clinic or office, retail store) - write blood pressure results in a log or diary - ask questions to understand - learn about high blood pressure - change to whole grain breads, cereal, pasta - drink 6 to 8 glasses of water each day - fill half of plate with vegetables - limit fast food meals to no more than 1 per week - manage portion size - prepare main meal at home 3 to 5 days each week - read food labels for fat, fiber, carbohydrates and portion size - switch to low-fat or skim milk - switch to sugar-free drinks Follow Up Plan: Telephone follow up appointment with care management team member scheduled for: 08/14/21 at 3:30  PM The patient has been provided with contact information for the care management team and has been advised to call with any health related questions or concerns.      Patient Care Plan: RN Care Manager Plan of Care     Problem Identified: Chronic Disease Management and Care Coordination Needs (DM2,HTN,HLD)   Priority: High     Long-Range Goal: Establish Plan of Care for Chronic Disease Management Needs (DM2,HTN and HLD)   Start Date: 08/14/2021  Expected End Date: 12/25/2022  Recent Progress: On track  Priority: High  Note:   Case closed unable to contact maintain  Current Barriers:  Knowledge Deficits related to plan of care for management of HTN, HLD, DMII, and hx alcohol abuse  Chronic Disease Management support and education needs related to HTN, HLD, DMII, and hx alcohol abuse   Hx of alcohol abuse States he has been doing good.  States he had cataract surgery on his lt eye on 12/19/21 and he will have the rt done on 12/30/21.  States he has not been driving as his vision has been blurry while he is healing.  States he is checking his CBG 3 times a day and they have been ranging around 150 and no higher than 200.  States he has had 2 low reading on Saturday mornings of 65 and 70 which he does not know why his sugars went lower. States his GI  issues are better and he is eating better now.  States he is still drinking ensure twice a day. States his weight is up to 135. State he is watching his portions on CHO States his B/P has been good and ranging 120/80..  States he has not been drink any alcohol.  States he is still smoking and does not wish to quit at this time.  RNCM Clinical Goal(s):  Patient will verbalize understanding of plan for management of HTN, HLD, DMII, and hx alcohol abuse  as evidenced by voiced adherence to plan of care verbalize basic understanding of  HTN, HLD, DMII, and hx alcohol abuse  disease process and self health management plan as evidenced by voiced  understanding and teach back take all medications exactly as prescribed and will call provider for medication related questions as evidenced by dispense report and pt verbalization attend all scheduled medical appointments: no scheduled appointments as evidenced by no upcoming scheduled demonstrate Improved adherence to prescribed treatment plan for HTN, HLD, DMII, and hx alcohol abuse  as evidenced by improved readings and adherence to plan of care continue to work with RN Care Manager to address care management and care coordination needs related to  HTN, HLD, DMII, and hx alcohol abuse  as evidenced by adherence to CM Team Scheduled appointments not experience hospital admission as evidenced by review of EMR. Hospital Admissions in last 6 months = 3  through collaboration with RN Care manager, provider, and care team.   Interventions: 1:1 collaboration with primary care provider regarding development and update of comprehensive plan of care as evidenced by provider attestation and co-signature Inter-disciplinary care team collaboration (see longitudinal plan of care) Evaluation of current treatment plan related to  self management and patient's adherence to plan as established by provider  Diabetes Interventions:  (Status:  Gaol Not Met. and Case closed unable to contact maintain  ) Long Term Goal Assessed patient's understanding of A1c goal: <7% Provided education to patient about basic DM disease process Reviewed medications with patient and discussed importance of medication adherence Counseled on importance of regular laboratory monitoring as prescribed Discussed plans with patient for ongoing care management follow up and provided patient with direct contact information for care management team Provided patient with written educational materials related to hypo and hyperglycemia and importance of correct treatment Reviewed scheduled/upcoming provider appointments including: none scheduled  Reviewed to call and reschedule appointment with Dr. Regis Bill Advised patient, providing education and rationale, to check cbg 3-4 times a day and record, calling provider for findings outside established parameters Reviewed to try eating a small snack of protein and CHO at bedtime especially on Friday nights.  Reviewed causes of hypoglycemia including how alcohol can effect sugars. Reinforced s/sx of hypoglycemia and how to treat. Lab Results  Component Value Date   HGBA1C 8.3 (H) 07/02/2021   Hyperlipidemia Interventions:  (Status:  Gaol Not Met. and Case closed unable to contact maintain  ) Long Term Goal Medication review performed; medication list updated in electronic medical record.  Counseled on importance of regular laboratory monitoring as prescribed Reviewed role and benefits of statin for ASCVD risk reduction Reviewed importance of limiting foods high in cholesterol Reviewed exercise goals and target of 150 minutes per week  Hypertension Interventions:  (Status:  Gaol Not Met. and Case closed unable to contact maintain  ) Long Term Goal Last practice recorded BP readings:  BP Readings from Last 3 Encounters:  07/03/21 118/80  05/19/21 128/80  04/30/21 116/70  Most recent eGFR/CrCl: No results found for: EGFR  No components found for: CRCL  Evaluation of current treatment plan related to hypertension self management and patient's adherence to plan as established by provider Provided education to patient re: stroke prevention, s/s of heart attack and stroke Counseled on adverse effects of illicit drug and excessive alcohol use in patients with high blood pressure  Counseled on the importance of exercise goals with target of 150 minutes per week Discussed plans with patient for ongoing care management follow up and provided patient with direct contact information for care management team Advised patient, providing education and rationale, to monitor blood pressure daily and record,  calling PCP for findings outside established parameters Provided education on prescribed diet low sodium low CHO heart healthy Reviewed to check his B/P several times a week      Hx Alcohol abuse  (Status:  Gaol Not Met. and Case closed unable to contact maintain  )  Long Term Goal Evaluation of current treatment plan related to  Hx Alcohol abuse , Substance abuse issues -  alcohol  self-management and patient's adherence to plan as established by provider. Discussed plans with patient for ongoing care management follow up and provided patient with direct contact information for care management team Evaluation of current treatment plan related to Hx Alcohol abuse and patient's adherence to plan as established by provider Provided education to patient re: Hx Alcohol abuse Social Work referral for declines referral to LCSW for alcohol abuse Discussed plans with patient for ongoing care management follow up and provided patient with direct contact information for care management team Encouraged to remain alcohol free and to try to avoid social situations when he might want to drink.  Reinforced how alcohol abuse effects his diabetes control and that zero alcohol beer still has carbohydrates     Patient Goals/Self-Care Activities: Take all medications as prescribed Attend all scheduled provider appointments Call pharmacy for medication refills 3-7 days in advance of running out of medications Call provider office for new concerns or questions  keep appointment with eye doctor check blood sugar at prescribed times: 4 times daily and when you have symptoms of low or high blood sugar check feet daily for cuts, sores or redness take the blood sugar log to all doctor visits drink 6 to 8 glasses of water each day fill half of plate with vegetables manage portion size prepare main meal at home 3 to 5 days each week Avoid drinking alcohol check blood pressure 3 times per week choose a place to take  my blood pressure (home, clinic or office, retail store) take blood pressure log to all doctor appointments begin an exercise program eat more whole grains, fruits and vegetables, lean meats and healthy fats limit salt intake to 2355m/day call for medicine refill 2 or 3 days before it runs out take all medications exactly as prescribed call doctor with any symptoms you believe are related to your medicine Follow Up Plan:  The patient has been provided with contact information for the care management team and has been advised to call with any health related questions or concerns.  No further follow up required: Case closed unable to contact maintain           Patient Care Plan: CCM Pharmacy Care Plan     Problem Identified: Problem: Hypertension, Hyperlipidemia, Diabetes, GERD, Depression, Anxiety, and Tobacco use      Long-Range Goal: Patient-Specific Goal   Start Date: 05/20/2022  Expected End Date:    This Visit's Progress: On track  Priority: High  Note:   Current Barriers:  Unable to independently monitor therapeutic efficacy Unable to achieve control of diabetes   Pharmacist Clinical Goal(s):  Patient will achieve adherence to monitoring guidelines and medication adherence to achieve therapeutic efficacy achieve control of diabetes as evidenced by A1c  through collaboration with PharmD and provider.   Interventions: 1:1 collaboration with Panosh, Standley Brooking, MD regarding development and update of comprehensive plan of care as evidenced by provider attestation and co-signature Inter-disciplinary care team collaboration (see longitudinal plan of care) Comprehensive medication review performed; medication list updated in electronic medical record  Hypertension (BP goal <130/80) -Uncontrolled (too low) -Current treatment: Amlodipine-benazepril 2.5-10 mg 1 capsule daily - Appropriate, Query effective, Query Safe, Accessible -Medications previously tried: n/a  -Current  home readings: 120/74, highest 178/80 (2 days ago) - checking daily and machine has a memory but he did not write down the readings -Current dietary habits: trying to limit salt intake -Current exercise habits: none -Denies hypotensive/hypertensive symptoms -Educated on BP goals and benefits of medications for prevention of heart attack, stroke and kidney damage; Importance of home blood pressure monitoring; Proper BP monitoring technique; -Counseled to monitor BP at home a few days a week, document, and provide log at future appointments -Recommended switching to amlodipine only per discharge instructions.  Hyperlipidemia: (LDL goal < 70) -Uncontrolled -Current treatment: Atorvastatin  40 mg 1 tablet daily - not filled since last year - Appropriate, Query effective, Safe, Query accessible -Medications previously tried: none  -Current dietary patterns: working on diet -Current exercise habits: none -Educated on Cholesterol goals;  Benefits of statin for ASCVD risk reduction; Importance of limiting foods high in cholesterol; -Recommended restarting atorvastatin.  Diabetes (A1c goal <7%) -Uncontrolled -Current medications: Jardiance 25 mg 1 tablet daily - Appropriate, Query effective, Safe, Accessible Tresiba inject 18 units daily in the morning, 18 at bedtime  - Appropriate, Query effective, Safe, Accessible Novolog 6 units three times daily - Appropriate, Query effective, Safe, Accessible -Medications previously tried: n/a  -Current home glucose readings fasting glucose: not checking post prandial glucose: not checking -Denies hypoglycemic/hyperglycemic symptoms -Current meal patterns:  breakfast: bowl of cereal, eggs and cheese or sandwich with jelly, boost with each meal  lunch: ham sandwich, left overs (peas, corn), greens every day, broccoli dinner: baked chicken, rice, peas and corn snacks: bugles, peanut butter & jelly sandwich, jolly ranchers drinks: boost, diet soda,  plenty of water with crystal lite in water - Yeti 30 ounces 3-4 times, boost with every meal  -Current exercise: none right now -Educated on A1c and blood sugar goals; Complications of diabetes including kidney damage, retinal damage, and cardiovascular disease; Exercise goal of 150 minutes per week; Benefits of routine self-monitoring of blood sugar; Continuous glucose monitoring; -Counseled to check feet daily and get yearly eye exams -Recommended switching to [place] 34 units once daily.  Depression/Anxiety (Goal: minimize symptoms) -Uncontrolled -Current treatment: Citalopram 20 mg 1 tablet daily - Appropriate, Query effective, Safe, Accessible Lorazepam 1 mg 1 tablet as needed for panic attacks - Query Appropriate, Query effective, Query Safe, Accessible -Medications previously tried/failed: n/a -PHQ9: 56 -GAD7: n/a -Educated on Benefits of medication for symptom control Benefits of cognitive-behavioral therapy with or without medication -Recommended to continue current medication  Tobacco use (Goal quit smoking) -Uncontrolled -Previous quit attempts: unknown -Current treatment  No medications -Patient smokes Within 30 minutes of waking -Patient triggers include:  habit -On a scale of 1-10, reports MOTIVATION  to quit is 1 -On a scale of 1-10, reports CONFIDENCE in quitting is 1 -Provided contact information for [facility]uit Line (1-800-QUIT-NOW) and encouraged patient to reach out to this group for support. - Will assess at next visit.  Alcohol abuse (Goal: minimize symptoms of withdrawal) -Controlled -Current treatment  Thiamine 100 mg 2 tablets twice daily - Appropriate, Effective, Safe, Accessible Acamprosate 333 mg 2 tablets three times daily - Appropriate, Effective, Safe, Accessible -Medications previously tried: none  -Recommended to continue current medication  GERD/Digestion (Goal: minimize symptoms) -Not ideally controlled -Current treatment  Pantoprazole 40 mg  1 tablet daily - Appropriate, Effective, Safe, Accessible Creon 36000 units three times daily with meals  - Appropriate, Query effective, Safe, Accessible -Medications previously tried: n/a  -Recommended to continue current medication  Neuropathy (Goal: minimize symptoms) -Uncontrolled -Current treatment  Gabapentin 300 mg 1 tablet three times daily - Query Appropriate, Query effective, Safe, Accessible -Medications previously tried: n/a  - Patient is unsure why he is on this medication.  Insomnia (Goal: improve quality and quantity of sleep) -Controlled -Current treatment  Trazodone 50 mg 1 tablet at bedtime - Appropriate, Effective, Safe, Accessible -Medications previously tried: none  -Recommended to continue current medication   Health Maintenance -Vaccine gaps: tetanus, influenza, COVID -Current therapy:  Folic acid 1 mg 1 tablet daily Multivitamin 1 tablet daily Vitamin B12 1 tablet daily Lumify 0.025% as needed Albuterol HFA as needed Furosemide 20 mg 1 tablet daily Vitamin B1 100 mg daily Nephrovite daily -Educated on Herbal supplement research is limited and benefits usually cannot be proven Cost vs benefit of each product must be carefully weighed by individual consumer -Patient is satisfied with current therapy and denies issues -Recommended to continue current medication  Patient Goals/Self-Care Activities Patient will:  - take medications as prescribed as evidenced by patient report and record review check glucose daily, document, and provide at future appointments check blood pressure every other day, document, and provide at future appointments  Follow Up Plan: The care management team will reach out to the patient again over the next 30 days.       Mr. Barnick was given information about Chronic Care Management services today including:  CCM service includes personalized support from designated clinical staff supervised by his physician, including  individualized plan of care and coordination with other care providers 24/7 contact phone numbers for assistance for urgent and routine care needs. Standard insurance, coinsurance, copays and deductibles apply for chronic care management only during months in which we provide at least 20 minutes of these services. Most insurances cover these services at 100%, however patients may be responsible for any copay, coinsurance and/or deductible if applicable. This service may help you avoid the need for more expensive face-to-face services. Only one practitioner may furnish and bill the service in a calendar month. The patient may stop CCM services at any time (effective at the end of the month) by phone call to the office staff.  Patient agreed to services and verbal consent obtained.   Patient verbalizes understanding of instructions and care plan provided today and agrees to view in [facility]. Active MyChart status and patient understanding of how to access instructions and care plan via MyChart confirmed with patient.    The pharmacy team will reach out to the patient again over the next 30 days.   Viona Gilmore, [facility]

## 2022-05-27 ENCOUNTER — Telehealth: Payer: Self-pay | Admitting: Pharmacist

## 2022-05-27 NOTE — Chronic Care Management (AMB) (Signed)
    Chronic Care Management Pharmacy Assistant   Name: Kwadwo Taras  MRN: [id number] DOB: November 02, 1975  Reason for Encounter: Insurance coverage with [facility].   I spoke with Shontae with [employer], she states patient is not showing any medication coverage with his insurance plan, he is only getting coverage for DME.  Spoke with [facility], patient's pharmacy coverage is through [place] [phone].  Spoke with Sharyn Lull at [employer], [facility] is not on the patients preferred pharmacy list and his medications will cost more through [employer].   [place] Pharmacist Assistant [phone]

## 2022-06-01 ENCOUNTER — Telehealth: Payer: Self-pay

## 2022-06-01 NOTE — Telephone Encounter (Signed)
Unsuccessful attempt to reach patient on preferred number listed in notes for scheduled AWV. Left message on voicemail okay to reschedule.

## 2022-06-02 ENCOUNTER — Telehealth: Payer: Self-pay | Admitting: Internal Medicine

## 2022-06-02 NOTE — Telephone Encounter (Signed)
Todd Rodriguez with [employer]  [phone]  Fax [phone]  Called to check on the status of the [facility] Start request form  Todd Rodriguez says he has faxed it several times since 05/18/22 and will fax it again today

## 2022-06-03 NOTE — Telephone Encounter (Signed)
A fax was sent on Aug. 24, 2023 and refax today.  Attempt to reach to [facility]. Got transfer to [place]. She confirmed they receive the fax. No further action needed.

## 2022-06-04 ENCOUNTER — Telehealth: Payer: Self-pay

## 2022-06-04 ENCOUNTER — Ambulatory Visit: Payer: Medicare Other | Admitting: Internal Medicine

## 2022-06-04 DIAGNOSIS — F17210 Nicotine dependence, cigarettes, uncomplicated: Secondary | ICD-10-CM | POA: Diagnosis not present

## 2022-06-04 DIAGNOSIS — I10 Essential (primary) hypertension: Secondary | ICD-10-CM

## 2022-06-04 DIAGNOSIS — E1169 Type 2 diabetes mellitus with other specified complication: Secondary | ICD-10-CM | POA: Diagnosis not present

## 2022-06-04 DIAGNOSIS — E785 Hyperlipidemia, unspecified: Secondary | ICD-10-CM

## 2022-06-04 DIAGNOSIS — Z794 Long term (current) use of insulin: Secondary | ICD-10-CM

## 2022-06-04 NOTE — Telephone Encounter (Signed)
Unsuccessful attempt to reach patient on preferred number listed in notes for scheduled AWV. Left message on voicemail okay to reschedule.

## 2022-06-09 ENCOUNTER — Telehealth: Payer: Self-pay | Admitting: Internal Medicine

## 2022-06-09 NOTE — Telephone Encounter (Signed)
Left message for patient to call back and schedule Medicare Annual Wellness Visit (AWV) either virtually or in office. Left  my Herbie Drape number [phone]   Last AWV 05/29/21 ; please schedule at anytime with [facility] Nurse Health Advisor 1 or 2

## 2022-06-11 ENCOUNTER — Encounter: Payer: Self-pay | Admitting: Internal Medicine

## 2022-06-15 ENCOUNTER — Ambulatory Visit (INDEPENDENT_AMBULATORY_CARE_PROVIDER_SITE_OTHER): Payer: Medicare Other

## 2022-06-15 VITALS — Ht 74.0 in

## 2022-06-15 DIAGNOSIS — Z0000 Encounter for general adult medical examination without abnormal findings: Secondary | ICD-10-CM

## 2022-06-15 NOTE — Progress Notes (Signed)
Subjective:   Lane Eland is a 46 y.o. male who presents for Medicare Annual/Subsequent preventive examination.  Review of Systems    Virtual Visit via Telephone Note  I connected with  Camran Keady on 06/15/22 at  3:30 PM EDT by telephone and verified that I am speaking with the correct person using two identifiers.  Location: Patient: Home Provider: Office Persons participating in the virtual visit: patient/Nurse Health Advisor   I discussed the limitations, risks, security and privacy concerns of performing an evaluation and management service by telephone and the availability of in person appointments. The patient expressed understanding and agreed to proceed.  Interactive audio and video telecommunications were attempted between this nurse and patient, however failed, due to patient having technical difficulties OR patient did not have access to video capability.  We continued and completed visit with audio only.  Some vital signs may be absent or patient reported.   Criselda Peaches, LPN  Cardiac Risk Factors include: advanced age (>69mn, >>104women);diabetes mellitus;male gender;hypertension;smoking/ tobacco exposure     Objective:    Today's Vitals   06/15/22 1550  Height: 6' 2"  (1.88 m)   Body mass index is 17.1 kg/m.     06/15/2022    4:02 PM 04/17/2022    1:35 PM 04/17/2022    1:33 PM 04/16/2022    8:56 PM 04/16/2022   11:58 AM 06/30/2021   10:00 PM 06/12/2021   11:08 AM  Advanced Directives  Does Patient Have a Medical Advance Directive? No No No  No No No  Would patient like information on creating a medical advance directive? No - Patient declined No - Patient declined No - Patient declined No - Patient declined  No - Guardian declined Yes (ED - Information included in AVS)    Current Medications (verified) Outpatient Encounter Medications as of 06/15/2022  Medication Sig   acamprosate (CAMPRAL) 333 MG tablet Take 666 mg by mouth 3 (three) times daily  with meals.   albuterol (VENTOLIN HFA) 108 (90 Base) MCG/ACT inhaler Inhale 2 puffs into the lungs every 4 (four) hours as needed for wheezing or shortness of breath.   amLODipine (NORVASC) 5 MG tablet Take 5 mg by mouth daily.   atorvastatin (LIPITOR) 40 MG tablet Take 1 tablet (40 mg total) by mouth daily.   B COMPLEX-C-FOLIC ACID PO Take 0.8 mg by mouth daily.   Brimonidine Tartrate (LUMIFY) 0.025 % SOLN Place 1 drop into both eyes daily as needed (eye irritation/dry eyes.).   citalopram (CELEXA) 20 MG tablet Take 10 mg per day for 1-2 weeks then increase to 20 mg per day or as directed (Patient taking differently: Take 20 mg by mouth daily.)   Continuous Blood Gluc Sensor (DEXCOM G6 SENSOR) MISC 1 Device by Does not apply route as directed.   Continuous Blood Gluc Transmit (DEXCOM G6 TRANSMITTER) MISC 1 Device by Does not apply route as directed.   Cyanocobalamin (VITAMIN B-12 PO) Take 1 tablet by mouth daily.   empagliflozin (JARDIANCE) 25 MG TABS tablet Take 1 tablet (25 mg total) by mouth daily.   folic acid (FOLVITE) 1 MG tablet Take 1 tablet (1 mg total) by mouth daily.   furosemide (LASIX) 20 MG tablet Take 20 mg by mouth.   gabapentin (NEURONTIN) 300 MG capsule Take 1 capsule (300 mg total) by mouth 3 (three) times daily.   glucose blood (ONE TOUCH TEST STRIPS) test strip Use as instructed   insulin aspart (NOVOLOG) 100 UNIT/ML injection Inject  6 Units into the skin 3 (three) times daily before meals.   Insulin Pen Needle 32G X 4 MM MISC 1 Device by Does not apply route in the morning, at noon, in the evening, and at bedtime.   LORazepam (ATIVAN) 1 MG tablet Take 1 tablet (1 mg total) by mouth every 8 (eight) hours as needed for anxiety. Panic attacks   pantoprazole (PROTONIX) 40 MG tablet TAKE 1 TABLET BY MOUTH EVERY DAY   thiamine 100 MG tablet Take 1 tablet (100 mg total) by mouth daily. (Patient taking differently: Take 200 mg by mouth in the morning and at bedtime.)   traZODone  (DESYREL) 50 MG tablet Take 50 mg by mouth at bedtime.   TRESIBA FLEXTOUCH 100 UNIT/ML FlexTouch Pen Inject 18 Units into the skin daily.   No facility-administered encounter medications on file as of 06/15/2022.    Allergies (verified) Patient has no known allergies.   History: Past Medical History:  Diagnosis Date   Acute pancreatitis 10/25/2013   Arthritis    Diabetes mellitus without complication (HCC)    GERD (gastroesophageal reflux disease)    Hepatic steatosis    History of pancreatitis    Hypertension    Myalgia and myositis 04/21/2014   much better from illness presumed infectious    has disability form  will complete     Pancreatic pseudocyst    Past Surgical History:  Procedure Laterality Date   ANTERIOR CERVICAL DECOMP/DISCECTOMY FUSION N/A 01/12/2018   Procedure: Revision ACDF C4-5, removal of hardware C5-6, exploration of fusion C5-6;  Surgeon: [name], MD;  Location: [facility];  Service: Orthopedics;  Laterality: N/A;  3.5 hrs   BIOPSY  05/13/2020   Procedure: BIOPSY;  Surgeon: Eloise Harman, DO;  [facility];  Service: Endoscopy;;   BIOPSY  09/23/2020   Procedure: BIOPSY;  Surgeon: Eloise Harman, DO;  [facility];  Service: Endoscopy;;   COLONOSCOPY WITH PROPOFOL N/A 05/13/2020   Dr. Abbey Chatters: Nonbleeding internal hemorrhoids.  Few small mouth diverticula in the sigmoid colon.  2 cecal polyps, inflammatory.  Sigmoid colon polyp hyperplastic.  Localized area of granular mucosa in the transverse colon, status post biopsy which was unremarkable.  No microscopic colitis.  Plans for repeat colonoscopy in 5 years.   ESOPHAGEAL BRUSHING  05/13/2020   Procedure: ESOPHAGEAL BRUSHING;  Surgeon: Eloise Harman, DO;  [facility];  Service: Endoscopy;;  mid esophagus   ESOPHAGOGASTRODUODENOSCOPY (EGD) WITH PROPOFOL N/A 05/13/2020   Dr. Abbey Chatters: Moderately severe esophagitis with no bleeding found in the middle third of the esophagus,  suspicious for Candida esophagitis.  KOH was negative. Gastritis, duodenal polyp.  Duodenal biopsy benign.  Duodenal bulb biopsy with Brunner's gland hyperplasia and mild changes suggestive of peptic injury.  No celiac disease seen.  GE junction biopsy showed GERD with mild inflammation consisten   ESOPHAGOGASTRODUODENOSCOPY (EGD) WITH PROPOFOL N/A 09/23/2020   Procedure: ESOPHAGOGASTRODUODENOSCOPY (EGD) WITH PROPOFOL;  Surgeon: Eloise Harman, DO;  [facility];  Service: Endoscopy;  Laterality: N/A;  7:30am   ESOPHAGOGASTRODUODENOSCOPY (EGD) WITH PROPOFOL N/A 04/17/2022   Procedure: ESOPHAGOGASTRODUODENOSCOPY (EGD) WITH PROPOFOL;  Surgeon: Daneil Dolin, MD;  [facility];  Service: Endoscopy;  Laterality: N/A;   NECK SURGERY     2 disc   POLYPECTOMY  05/13/2020   Procedure: POLYPECTOMY;  Surgeon: Eloise Harman, DO;  [facility];  Service: Endoscopy;;   SPINAL FUSION     Family  History  Problem Relation Age of Onset   Diabetes Mother    Hypertension Mother    Diabetes Father    Hypertension Father    Arthritis Maternal Grandmother    Hypertension Maternal Grandfather    Diabetes Maternal Grandfather    Colon cancer Neg Hx    Pancreatitis Neg Hx    Pancreatic cancer Neg Hx    Social History   Socioeconomic History   Marital status: Married    Spouse name: Not on file   Number of children: 1   Years of education: Not on file   Highest education level: Not on file  Occupational History    Employer: [employer]  Tobacco Use   Smoking status: Every Day    Packs/day: 1.00    Years: 12.00    Total pack years: 12.00    Types: Cigarettes    Passive exposure: Current   Smokeless tobacco: Never  Vaping Use   Vaping Use: Never used  Substance and Sexual Activity   Alcohol use: Not Currently   Drug use: No   Sexual activity: Yes    Partners: Female    Comment: Patient's Wife  Other Topics Concern   Not on file  Social History  Narrative   5 hours of sleep    2 people living in the home   A dog living in the home   [employer]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   Eye exam nany clark    FA tobacco some etoh neg rd exercises   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             Social Determinants of Health   Financial Resource Strain: Low Risk  (06/15/2022)   Overall Financial Resource Strain (CARDIA)    Difficulty of Paying Living Expenses: Not hard at all  Food Insecurity: No Food Insecurity (06/15/2022)   Hunger Vital Sign    Worried About Running Out of Food in the Last Year: Never true    Ran Out of Food in the Last Year: Never true  Transportation Needs: No Transportation Needs (06/15/2022)   PRAPARE - Hydrologist (Medical): No    Lack of Transportation (Non-Medical): No  Physical Activity: Inactive (06/15/2022)   Exercise Vital Sign    Days of Exercise per Week: 0 days    Minutes of Exercise per Session: 0 min  Stress: No Stress Concern Present (06/15/2022)   [place]    Feeling of Stress : Not at all  Social Connections: [place] (06/15/2022)   Social Connection and Isolation Panel [NHANES]    Frequency of Communication with Friends and Family: More than three times a week    Frequency of Social Gatherings with Friends and Family: More than three times a week    Attends Religious Services: More than 4 times per year    Active Member of [employer] or Organizations: Yes    Attends Music therapist: More than 4 times per year    Marital Status: Married    Tobacco Counseling Ready to quit: Yes Counseling given: Yes   Clinical Intake:  Pre-visit preparation completed: No  Pain : No/denies painNutrition Risk Assessment:  Has the patient had any N/V/D within the last 2 months?  No  Does the patient have any non-healing wounds?  No  Has the patient had any  unintentional weight loss or weight gain?  No   Diabetes:  Is the patient diabetic?  Yes  If diabetic, was a CBG obtained today?  Yes  Did the patient bring in their glucometer from home?  No  How often do you monitor your CBG's? Dexcom g7.   Financial Strains and Diabetes Management:  Are you having any financial strains with the device, your supplies or your medication? No .  Does the patient want to be seen by Chronic Care Management for management of their diabetes?  No  Would the patient like to be referred to a Nutritionist or for Diabetic Management?  No   Diabetic Exams:  Diabetic Eye Exam: Completed No. Overdue for diabetic eye exam. Pt has been advised about the importance in completing this exam. A referral has been placed today. Message sent to referral coordinator for scheduling purposes. Advised pt to expect a call from office referred to regarding appt.  Diabetic Foot Exam: Completed No. Pt has been advised about the importance in completing this exam. Pt is scheduled for diabetic foot exam on Followed by PCP.       BMI - recorded: 17.08 Nutritional Status: BMI of 19-24  Normal Nutritional Risks: None Diabetes: No  How often do you need to have someone help you when you read instructions, pamphlets, or other written materials from your doctor or pharmacy?: 1 - Never  Diabetic?  Yes  Interpreter Needed?: No  Information entered by :: Rolene Arbour LPN   Activities of Daily Living    06/15/2022    3:59 PM 04/16/2022    7:12 PM  In your present state of health, do you have any difficulty performing the following activities:  Hearing? 0   Vision? 0   Difficulty concentrating or making decisions? 0   Walking or climbing stairs? 0   Dressing or bathing? 0   Doing errands, shopping? 0 0  Preparing Food and eating ? N   Using the Toilet? N   In the past six months, have you accidently leaked urine? N   Do you have problems with loss of bowel control? Y    Comment Wears depends. Followed by Gastrologist   Managing your Medications? N   Managing your Finances? N   Housekeeping or managing your Housekeeping? N     Patient Care Team: Panosh, Standley Brooking, MD as PCP - General (Internal Medicine) Mahala Menghini PA-C as Physician Assistant (Gastroenterology) Alda Berthold, DO as Consulting Physician (Neurology) [facility], Melanie Crazier, MD as Consulting Physician (Endocrinology) Eloise Harman, DO as Consulting Physician (Internal Medicine)  Indicate any recent Medical Services you may have received from other than [facility] providers in the past year (date may be approximate).     Assessment:   This is a routine wellness examination for Jahmeek.  Hearing/Vision screen Hearing Screening - Comments:: .Denies hearing difficulties   Vision Screening - Comments:: Wear reading glasses. Followed by Dr Jeneen Rinks  Dietary issues and exercise activities discussed: Current Exercise Habits: The patient does not participate in regular exercise at present, Exercise limited by: None identified   Goals Addressed               This Visit's Progress     Patient stated (pt-stated)        I would like to gain weight.       Depression Screen    06/15/2022    3:57 PM 04/27/2022    8:42 AM 06/12/2021   11:18 AM 05/29/2021    3:34 PM 12/23/2020  1:47 PM 04/17/2019    3:11 PM 01/25/2015   12:48 PM  PHQ 2/9 Scores  PHQ - 2 Score 0 6 0 0 2 0 0  PHQ- 9 Score 0 22   9      Fall Risk    06/15/2022    4:01 PM 04/27/2022    8:41 AM 06/12/2021   11:16 AM 05/29/2021    3:33 PM 10/11/2019   12:24 PM  Fall Risk   Falls in the past year? 1 1 1  0 1  Number falls in past yr: 0 1 0 0 1  Injury with Fall? 0 1 1 0 1  Comment   hurt shoulder    Risk for fall due to : No Fall Risks Impaired balance/gait History of fall(s) No Fall Risks   Follow up  Falls prevention discussed Education provided;Falls prevention discussed Falls evaluation completed     FALL  RISK PREVENTION PERTAINING TO THE HOME:  Any stairs in or around the home? Yes  If so, are there any without handrails? No  Home free of loose throw rugs in walkways, pet beds, electrical cords, etc? Yes  Adequate lighting in your home to reduce risk of falls? Yes   ASSISTIVE DEVICES UTILIZED TO PREVENT FALLS:  Life alert? No  Use of a cane, walker or w/c? No  Grab bars in the bathroom? No  Shower chair or bench in shower? No  Elevated toilet seat or a handicapped toilet? No   TIMED UP AND GO:  Was the test performed? No . Audio Visit Cognitive Function:        06/15/2022    4:02 PM  6CIT Screen  What Year? 0 points  What month? 0 points  What time? 0 points  Count back from 20 0 points  Months in reverse 0 points  Repeat phrase 0 points  Total Score 0 points     Immunizations Immunization History  Administered Date(s) Administered   Pneumococcal Conjugate-13 01/15/2016   Pneumococcal Polysaccharide-23 09/12/2018    TDAP status: Due, Education has been provided regarding the importance of this vaccine. Advised may receive this vaccine at local pharmacy or Health Dept. Aware to provide a copy of the vaccination record if obtained from local pharmacy or Health Dept. Verbalized acceptance and understanding.  Flu Vaccine status: Due, Education has been provided regarding the importance of this vaccine. Advised may receive this vaccine at local pharmacy or Health Dept. Aware to provide a copy of the vaccination record if obtained from local pharmacy or Health Dept. Verbalized acceptance and understanding.    Covid-19 vaccine status: Declined, Education has been provided regarding the importance of this vaccine but patient still declined. Advised may receive this vaccine at local pharmacy or Health Dept.or vaccine clinic. Aware to provide a copy of the vaccination record if obtained from local pharmacy or Health Dept. Verbalized acceptance and understanding.  Qualifies for  Shingles Vaccine? No   Zostavax completed No   Shingrix Completed?: No.    Education has been provided regarding the importance of this vaccine. Patient has been advised to call insurance company to determine out of pocket expense if they have not yet received this vaccine. Advised may also receive vaccine at local pharmacy or Health Dept. Verbalized acceptance and understanding.  Screening Tests Health Maintenance  Topic Date Due   URINE MICROALBUMIN  10/17/2020   FOOT EXAM  12/23/2021   COVID-19 Vaccine (1) 10/28/2022 (Originally 03/23/1981)   INFLUENZA VACCINE  01/03/2023 (Originally 05/05/2022)  TETANUS/TDAP  06/16/2023 (Originally 11/05/2018)   HEMOGLOBIN A1C  10/17/2022   OPHTHALMOLOGY EXAM  12/04/2022   COLONOSCOPY (Pts 45-79yr Insurance coverage will need to be confirmed)  05/13/2030   Hepatitis C Screening  Completed   HIV Screening  Completed   HPV VACCINES  Aged Out    Health Maintenance  Health Maintenance Due  Topic Date Due   URINE MICROALBUMIN  10/17/2020   FOOT EXAM  12/23/2021      Lung Cancer Screening: (Low Dose CT Chest recommended if Age 163-80years, 30 pack-year currently smoking OR have quit w/in 15years.) does qualify.   Lung Cancer Screening Referral: Patient deferred  Additional Screening:  Hepatitis C Screening: does qualify; Completed 04/17/22  Vision Screening: Recommended annual ophthalmology exams for early detection of glaucoma and other disorders of the eye. Is the patient up to date with their annual eye exam?  Yes  Who is the provider or what is the name of the office in which the patient attends annual eye exams? Dr FGirard CooterIf pt is not established with a provider, would they like to be referred to a provider to establish care? No .   Dental Screening: Recommended annual dental exams for proper oral hygiene  Community Resource Referral / Chronic Care Management:  CRR required this visit?  No   CCM required this visit?  No       Plan:     I have personally reviewed and noted the following in the patient's chart:   Medical and social history Use of alcohol, tobacco or illicit drugs  Current medications and supplements including opioid prescriptions. Patient is not currently taking opioid prescriptions. Functional ability and status Nutritional status Physical activity Advanced directives List of other physicians Hospitalizations, surgeries, and ER visits in previous 12 months Vitals Screenings to include cognitive, depression, and falls Referrals and appointments  In addition, I have reviewed and discussed with patient certain preventive protocols, quality metrics, and best practice recommendations. A written personalized care plan for preventive services as well as general preventive health recommendations were provided to patient.     BCriselda Peaches LPN   [id number]  Nurse Notes: Patient due labs Urine Microalbumin

## 2022-06-15 NOTE — Patient Instructions (Addendum)
[name] , Thank you for taking time to come for your Medicare Wellness Visit. I appreciate your ongoing commitment to your health goals. Please review the following plan we discussed and let me know if I can assist you in the future.   Screening recommendations/referrals: Colonoscopy:  Recommended yearly ophthalmology/optometry visit for glaucoma screening and checkup Recommended yearly dental visit for hygiene and checkup  Vaccinations: Influenza vaccine: Due Pneumococcal vaccine:  Tdap vaccine: Due Shingles vaccine:    Covid-19: Deferred  Advanced directives: Advance directive discussed with you today. Even though you declined this today, please call our office should you change your mind, and we can give you the proper paperwork for you to fill out.   Conditions/risks identified: None  Next appointment: Follow up in one year for your annual wellness visit    Preventive Care 40-64 Years, Male Preventive care refers to lifestyle choices and visits with your health care provider that can promote health and wellness. What does preventive care include? A yearly physical exam. This is also called an annual well check. Dental exams once or twice a year. Routine eye exams. Ask your health care provider how often you should have your eyes checked. Personal lifestyle choices, including: Daily care of your teeth and gums. Regular physical activity. Eating a healthy diet. Avoiding tobacco and drug use. Limiting alcohol use. Practicing safe sex. Taking low-dose aspirin every day starting at age 49. What happens during an annual well check? The services and screenings done by your health care provider during your annual well check will depend on your age, overall health, lifestyle risk factors, and family history of disease. Counseling  Your health care provider may ask you questions about your: Alcohol use. Tobacco use. Drug use. Emotional well-being. Home and relationship  well-being. Sexual activity. Eating habits. Work and work Statistician. Screening  You may have the following tests or measurements: Height, weight, and BMI. Blood pressure. Lipid and cholesterol levels. These may be checked every 5 years, or more frequently if you are over 34 years old. Skin check. Lung cancer screening. You may have this screening every year starting at age 15 if you have a 30-pack-year history of smoking and currently smoke or have quit within the past 15 years. Fecal occult blood test (FOBT) of the stool. You may have this test every year starting at age 35. Flexible sigmoidoscopy or colonoscopy. You may have a sigmoidoscopy every 5 years or a colonoscopy every 10 years starting at age 44. Prostate cancer screening. Recommendations will vary depending on your family history and other risks. Hepatitis C blood test. Hepatitis B blood test. Sexually transmitted disease (STD) testing. Diabetes screening. This is done by checking your blood sugar (glucose) after you have not eaten for a while (fasting). You may have this done every 1-3 years. Discuss your test results, treatment options, and if necessary, the need for more tests with your health care provider. Vaccines  Your health care provider may recommend certain vaccines, such as: Influenza vaccine. This is recommended every year. Tetanus, diphtheria, and acellular pertussis (Tdap, Td) vaccine. You may need a Td booster every 10 years. Zoster vaccine. You may need this after age 23. Pneumococcal 13-valent conjugate (PCV13) vaccine. You may need this if you have certain conditions and have not been vaccinated. Pneumococcal polysaccharide (PPSV23) vaccine. You may need one or two doses if you smoke cigarettes or if you have certain conditions. Talk to your health care provider about which screenings and vaccines you need and  how often you need them. This information is not intended to replace advice given to you by your  health care provider. Make sure you discuss any questions you have with your health care provider. Document Released: 10/18/2015 Document Revised: 06/10/2016 Document Reviewed: 07/23/2015 Elsevier Interactive Patient Education  2017 [place] Prevention in the Home Falls can cause injuries. They can happen to people of all ages. There are many things you can do to make your home safe and to help prevent falls. What can I do on the outside of my home? Regularly fix the edges of walkways and driveways and fix any cracks. Remove anything that might make you trip as you walk through a door, such as a raised step or threshold. Trim any bushes or trees on the path to your home. Use bright outdoor lighting. Clear any walking paths of anything that might make someone trip, such as rocks or tools. Regularly check to see if handrails are loose or broken. Make sure that both sides of any steps have handrails. Any raised decks and porches should have guardrails on the edges. Have any leaves, snow, or ice cleared regularly. Use sand or salt on walking paths during winter. Clean up any spills in your garage right away. This includes oil or grease spills. What can I do in the bathroom? Use night lights. Install grab bars by the toilet and in the tub and shower. Do not use towel bars as grab bars. Use non-skid mats or decals in the tub or shower. If you need to sit down in the shower, use a plastic, non-slip stool. Keep the floor dry. Clean up any water that spills on the floor as soon as it happens. Remove soap buildup in the tub or shower regularly. Attach bath mats securely with double-sided non-slip rug tape. Do not have throw rugs and other things on the floor that can make you trip. What can I do in the bedroom? Use night lights. Make sure that you have a light by your bed that is easy to reach. Do not use any sheets or blankets that are too big for your bed. They should not hang down  onto the floor. Have a firm chair that has side arms. You can use this for support while you get dressed. Do not have throw rugs and other things on the floor that can make you trip. What can I do in the kitchen? Clean up any spills right away. Avoid walking on wet floors. Keep items that you use a lot in easy-to-reach places. If you need to reach something above you, use a strong step stool that has a grab bar. Keep electrical cords out of the way. Do not use floor polish or wax that makes floors slippery. If you must use wax, use non-skid floor wax. Do not have throw rugs and other things on the floor that can make you trip. What can I do with my stairs? Do not leave any items on the stairs. Make sure that there are handrails on both sides of the stairs and use them. Fix handrails that are broken or loose. Make sure that handrails are as long as the stairways. Check any carpeting to make sure that it is firmly attached to the stairs. Fix any carpet that is loose or worn. Avoid having throw rugs at the top or bottom of the stairs. If you do have throw rugs, attach them to the floor with carpet tape. Make sure that you have  a light switch at the top of the stairs and the bottom of the stairs. If you do not have them, ask someone to add them for you. What else can I do to help prevent falls? Wear shoes that: Do not have high heels. Have rubber bottoms. Are comfortable and fit you well. Are closed at the toe. Do not wear sandals. If you use a stepladder: Make sure that it is fully opened. Do not climb a closed stepladder. Make sure that both sides of the stepladder are locked into place. Ask someone to hold it for you, if possible. Clearly mark and make sure that you can see: Any grab bars or handrails. First and last steps. Where the edge of each step is. Use tools that help you move around (mobility aids) if they are needed. These include: Canes. Walkers. Scooters. Crutches. Turn  on the lights when you go into a dark area. Replace any light bulbs as soon as they burn out. Set up your furniture so you have a clear path. Avoid moving your furniture around. If any of your floors are uneven, fix them. If there are any pets around you, be aware of where they are. Review your medicines with your doctor. Some medicines can make you feel dizzy. This can increase your chance of falling. Ask your doctor what other things that you can do to help prevent falls. This information is not intended to replace advice given to you by your health care provider. Make sure you discuss any questions you have with your health care provider. Document Released: 07/18/2009 Document Revised: 02/27/2016 Document Reviewed: 10/26/2014 Elsevier Interactive Patient Education  2017 [employer].

## 2022-07-08 ENCOUNTER — Ambulatory Visit (INDEPENDENT_AMBULATORY_CARE_PROVIDER_SITE_OTHER): Payer: Medicare Other | Admitting: Pharmacist

## 2022-07-08 DIAGNOSIS — I10 Essential (primary) hypertension: Secondary | ICD-10-CM

## 2022-07-08 DIAGNOSIS — E1165 Type 2 diabetes mellitus with hyperglycemia: Secondary | ICD-10-CM

## 2022-07-08 NOTE — Progress Notes (Signed)
Chronic Care Management Pharmacy Note  07/08/2022 Name:  Todd Rodriguez MRN:  [id number] DOB:  September 28, 1976  Summary: A1c not at goal < 7% Pt has difficulty keeping up with medications and changes Pt is not taking medications as prescribed  Recommendations/Changes made from today's visit: -Recommended continued BP monitoring at home -Recommended increasing Novolog to 8 units three times daily -Recommended repeat BMP given restarting ACE inhibitor  Plan: Follow up with PCP in 2-3 weeks for further insulin adjustments Follow up after PCP visit   Subjective: Todd Rodriguez is an 46 y.o. year old male who is a primary patient of Panosh, Standley Brooking, MD.  The CCM team was consulted for assistance with disease management and care coordination needs.    Engaged with patient by telephone for follow up visit in response to provider referral for pharmacy case management and/or care coordination services.   Consent to Services:  The patient was given information about Chronic Care Management services, agreed to services, and gave verbal consent prior to initiation of services.  Please see initial visit note for detailed documentation.   Patient Care Team: Panosh, Standley Brooking, MD as PCP - General (Internal Medicine) Westly Pam as Physician Assistant (Gastroenterology) Alda Berthold, DO as Consulting Physician (Neurology) [facility], Melanie Crazier, MD as Consulting Physician (Endocrinology) Eloise Harman, DO as Consulting Physician (Internal Medicine)  Recent office visits: 04/27/2022 Shanon Ace MD - Patient was seen for Anemia, unspecified and additional concerns. No medication changes. Follow up in 3 months.   05/29/2021 Randel Pigg LPN - Medicare annual wellness exam.  Recent consult visits: 04/15/2022 Hurshel Keys DO (GI) -Patient was seen for Diarrhea, unspecified type and additional issues. No medication changes. Follow up in 6-8 weeks.   Hospital visits: Patient was  seen at [facility] ED on 05/08/2022 due to detox evaluation.   New?Medications Started at [facility] Discharge:?? No new medications Medication Changes at Hospital Discharge: No medication changes Medications Discontinued at Hospital Discharge: No medications discontinued Medications that remain the same after Hospital Discharge:??  -All other medications will remain the same.       Admitted to [facility] on 04/16/2022 due to heme positive stool. Discharge date was 04/18/2022.    New?Medications Started at [facility] Discharge:?? No new medications Medication Changes at Hospital Discharge: No medication changes Medications Discontinued at Hospital Discharge: dicyclomine 10 MG capsule (BENTYL) NovoLOG FlexPen 100 UNIT/ML FlexPen (insulin aspart) ondansetron 4 MG disintegrating tablet (Zofran ODT) Medications that remain the same after Hospital Discharge:??  -All other medications will remain the same.     Objective:  Lab Results  Component Value Date   CREATININE 0.61 04/18/2022   BUN 5 (L) 04/18/2022   GFR 128.39 04/30/2021   EGFR 106 04/15/2022   GFRNONAA >60 04/18/2022   GFRAA >60 05/10/2020   NA 135 04/18/2022   K 3.4 (L) 04/18/2022   CALCIUM 8.6 (L) 04/18/2022   CO2 25 04/18/2022   GLUCOSE 214 (H) 04/18/2022    Lab Results  Component Value Date/Time   HGBA1C 8.8 (H) 04/16/2022 06:14 PM   HGBA1C 8.3 (H) 07/02/2021 07:26 AM   GFR 128.39 04/30/2021 11:43 AM   GFR 113.33 12/23/2020 02:41 PM   MICROALBUR <0.7 10/18/2019 10:51 AM   MICROALBUR 2.4 (H) 03/21/2019 11:04 AM    Last diabetic Eye exam: No results found for: "HMDIABEYEEXA"  Last diabetic Foot exam: No results found for: "HMDIABFOOTEX"   Lab Results  Component Value Date   CHOL 160 12/23/2020  HDL 69.60 12/23/2020   LDLCALC 74 12/23/2020   LDLDIRECT 193.0 03/21/2019   TRIG 80.0 12/23/2020   CHOLHDL 2 12/23/2020       Latest Ref Rng & Units 04/17/2022    4:02 AM 04/16/2022   12:02 PM  04/15/2022   10:23 AM  Hepatic Function  Total Protein 6.5 - 8.1 g/dL 5.2  6.9  6.4   Albumin 3.5 - 5.0 g/dL 2.9  4.0  4.1   AST 15 - 41 U/L 52  76  115   ALT 0 - 44 U/L 45  67  68   Alk Phosphatase 38 - 126 U/L 359  530  672   Total Bilirubin 0.3 - 1.2 mg/dL 0.8  1.7  1.4     Lab Results  Component Value Date/Time   TSH 3.030 04/15/2022 10:23 AM   TSH 1.68 04/30/2021 11:43 AM   FREET4 1.02 10/18/2019 10:51 AM   FREET4 0.68 07/19/2018 03:37 PM       Latest Ref Rng & Units 04/18/2022    7:52 AM 04/17/2022    4:02 AM 04/16/2022   10:07 PM  CBC  WBC 4.0 - 10.5 K/uL 2.4  2.1  2.5   Hemoglobin 13.0 - 17.0 g/dL 10.2  7.8  7.5   Hematocrit 39.0 - 52.0 % 30.0  22.5  21.7   Platelets 150 - 400 K/uL 143  85  82     No results found for: "VD25OH"  Clinical ASCVD: No  The ASCVD Risk score (Arnett DK, et al., 2019) failed to calculate for the following reasons:   The valid HDL cholesterol range is 20 to 100 mg/dL       06/15/2022    3:57 PM 04/27/2022    8:42 AM 06/12/2021   11:18 AM  Depression screen PHQ 2/9  Decreased Interest 0 3 0  Down, Depressed, Hopeless 0 3 0  PHQ - 2 Score 0 6 0  Altered sleeping 0 3   Tired, decreased energy 0 3   Change in appetite 0 3   Feeling bad or failure about yourself  0 3   Trouble concentrating 0 1   Moving slowly or fidgety/restless 0 3   Suicidal thoughts 0 0   PHQ-9 Score 0 22   Difficult doing work/chores Not difficult at all Extremely dIfficult       Social History   Tobacco Use  Smoking Status Every Day   Packs/day: 1.00   Years: 12.00   Total pack years: 12.00   Types: Cigarettes   Passive exposure: Current  Smokeless Tobacco Never   BP Readings from Last 3 Encounters:  04/27/22 122/76  04/18/22 (!) 104/58  04/15/22 (!) 84/59   Pulse Readings from Last 3 Encounters:  04/27/22 64  04/18/22 (!) 55  04/15/22 98   Wt Readings from Last 3 Encounters:  04/27/22 133 lb 3.2 oz (60.4 kg)  04/17/22 143 lb 4.8 oz (65 kg)   04/15/22 126 lb (57.2 kg)   BMI Readings from Last 3 Encounters:  06/15/22 17.10 kg/m  04/27/22 17.10 kg/m  04/17/22 18.40 kg/m    Assessment/Interventions: Review of patient past medical history, allergies, medications, health status, including review of consultants reports, laboratory and other test data, was performed as part of comprehensive evaluation and provision of chronic care management services.   SDOH:  (Social Determinants of Health) assessments and interventions performed: Yes SDOH Interventions    Flowsheet Row Clinical Support from 06/15/2022 in [place] at [place]  Chronic Care Management from 05/20/2022 in [place] at [employer] from 04/27/2022 in [place] at [place] Management from 06/12/2021 in [place] at [place] from 05/29/2021 in [place] at [place] Interventions Intervention Not Indicated -- -- Intervention Not Indicated Intervention Not Indicated  Housing Interventions Intervention Not Indicated -- -- Intervention Not Indicated Intervention Not Indicated  Transportation Interventions Intervention Not Indicated Intervention Not Indicated -- Intervention Not Indicated Intervention Not Indicated  Utilities Interventions Intervention Not Indicated -- -- -- --  Alcohol Usage Interventions Intervention Not Indicated (Score <7) -- -- -- --  Depression Interventions/Treatment  -- -- Medication, Referral to Psychiatry -- --  Financial Strain Interventions Intervention Not Indicated Intervention Not Indicated -- Intervention Not Indicated Intervention Not Indicated  Physical Activity Interventions Intervention Not Indicated -- -- -- Intervention Not Indicated  Stress Interventions Intervention Not Indicated -- -- Intervention Not Indicated Intervention Not Indicated  Social Connections Interventions Intervention Not Indicated -- -- --  Intervention Not Indicated      SDOH Screenings   Food Insecurity: No Food Insecurity (06/15/2022)  Housing: Low Risk  (06/15/2022)  Transportation Needs: No Transportation Needs (06/15/2022)  Utilities: Not At Risk (06/15/2022)  Alcohol Screen: Low Risk  (06/15/2022)  Depression (PHQ2-9): Low Risk  (06/15/2022)  Recent Concern: Depression (PHQ2-9) - High Risk (04/27/2022)  Financial Resource Strain: Low Risk  (06/15/2022)  Physical Activity: Inactive (06/15/2022)  Social Connections: Socially Integrated (06/15/2022)  Stress: No Stress Concern Present (06/15/2022)  Tobacco Use: High Risk (06/15/2022)     [place]  No Known Allergies  Medications Reviewed Today     Reviewed by Viona Gilmore, [facility] (Pharmacist) on 07/08/22 at 1542  Med List Status: <None>   Medication Order Taking? Sig Documenting Provider Last Dose Status Informant  acamprosate (CAMPRAL) 333 MG tablet [id number]  Take 666 mg by mouth 3 (three) times daily with meals. [provider]  Active   albuterol (VENTOLIN HFA) 108 (90 Base) MCG/ACT inhaler [id number]  Inhale 2 puffs into the lungs every 4 (four) hours as needed for wheezing or shortness of breath. Roxan Hockey, MD  Active Self  amlodipine-benazepril (LOTREL) 2.5-10 MG capsule [id number] Yes Take 1 capsule by mouth daily. [provider]  Active   atorvastatin (LIPITOR) 40 MG tablet [id number]  Take 1 tablet (40 mg total) by mouth daily. Roxan Hockey, MD  Active Self  B COMPLEX-C-FOLIC ACID PO [id number]  Take 0.8 mg by mouth daily. [provider]  Active   Brimonidine Tartrate (LUMIFY) 0.025 % SOLN [id number]  Place 1 drop into both eyes daily as needed (eye irritation/dry eyes.). [provider]  Active Self  citalopram (CELEXA) 20 MG tablet [id number]  Take 10 mg per day for 1-2 weeks then increase to 20 mg per day or as directed  Patient taking differently: Take 20 mg by mouth daily.   Roxan Hockey, MD  Active Self   Continuous Blood Gluc Sensor (DEXCOM G6 SENSOR) MISC [id number]  1 Device by Does not apply route as directed. Roxan Hockey, MD  Active Self  Continuous Blood Gluc Transmit (DEXCOM G6 TRANSMITTER) MISC [id number]  1 Device by Does not apply route as directed. Roxan Hockey, MD  Active Self  Cyanocobalamin (VITAMIN B-12 PO) [id number]  Take 1 tablet by mouth daily. [provider]  Active Self  empagliflozin (JARDIANCE) 25 MG TABS tablet [id number]  Take 1 tablet (25 mg total)  by mouth daily. Panosh, Standley Brooking, MD  Active   folic acid (FOLVITE) 1 MG tablet [id number]  Take 1 tablet (1 mg total) by mouth daily. Panosh, Standley Brooking, MD  Active   furosemide (LASIX) 20 MG tablet [id number]  Take 20 mg by mouth. [provider]  Active   gabapentin (NEURONTIN) 300 MG capsule [id number]  Take 1 capsule (300 mg total) by mouth 3 (three) times daily. Roxan Hockey, MD  Active Self  glucose blood (ONE TOUCH TEST STRIPS) test strip [id number]  Use as instructed Emokpae, Courage, MD  Active Self  insulin aspart (NOVOLOG) 100 UNIT/ML injection [id number]  Inject 8 Units into the skin 3 (three) times daily before meals. [provider]  Active   Insulin Pen Needle 32G X 4 MM MISC [id number]  1 Device by Does not apply route in the morning, at noon, in the evening, and at bedtime. Roxan Hockey, MD  Active Self  LORazepam (ATIVAN) 1 MG tablet [id number]  Take 1 tablet (1 mg total) by mouth every 8 (eight) hours as needed for anxiety. Panic attacks Panosh, Standley Brooking, MD  Active   pantoprazole (PROTONIX) 40 MG tablet [id number]  TAKE 1 TABLET BY MOUTH EVERY DAY Eloise Harman, DO  Active Self  thiamine 100 MG tablet [id number]  Take 1 tablet (100 mg total) by mouth daily.  Patient taking differently: Take 200 mg by mouth in the morning and at bedtime.   Panosh, Standley Brooking, MD  Active   traZODone (DESYREL) 50 MG tablet [id number]  Take 50 mg by mouth at bedtime. [provider]  Active    TRESIBA FLEXTOUCH 100 UNIT/ML FlexTouch Pen [id number]  Inject 18 Units into the skin daily.  Patient taking differently: Inject 24 Units into the skin daily.   Roxan Hockey, MD  Active Self            Patient Active Problem List   Diagnosis Date Noted   Heme positive stool 04/17/2022   Uncontrolled type 2 diabetes mellitus with hyperglycemia, with long-term current use of insulin ([place]) 04/17/2022   Pancytopenia ([place]) 04/16/2022   Hypokalemia 04/16/2022   DKA, type 2 ([place]) 07/01/2021   DKA (diabetic ketoacidosis) ([place]) 06/30/2021   Hyponatremia 06/30/2021   Hypothermia 06/30/2021   COVID-19 virus infection 06/30/2021   Hyperkalemia 06/30/2021   AKI (acute kidney injury) ([place]) 06/30/2021   Nausea & vomiting 06/30/2021   Failure to thrive in adult 06/30/2021   Starvation ketoacidosis 06/30/2021   Tobacco abuse 06/30/2021   Loose stools 08/27/2020   Abnormal weight loss 04/26/2020   Rectal bleeding 04/26/2020   Pancreatic pseudocyst    Protein-calorie malnutrition, severe 05/29/2019   High anion gap metabolic acidosis 26/94/8546   Alcohol abuse 05/28/2019   Thrombocytopenia ([place]) 05/28/2019   Pancreatic cyst 05/28/2019   Hepatic lesion 05/28/2019   Dyslipidemia 05/11/2019   Abnormal LFTs 09/12/2018   Medication management 09/12/2018   S/P cervical spinal fusion 01/12/2018   Stenosis of cervical spine with myelopathy ([place]) 01/03/2018   Carpal tunnel syndrome of left wrist 12/10/2017   Cervical radiculopathy 11/11/2017   History of fusion of cervical spine 10/22/2017   Chronic pancreatitis ([place]) 10/28/2015   Cramps, extremity 06/25/2015   Diabetes mellitus type 2, uncontrolled, without complications 27/12/5007   Hand cramps 01/25/2015   Fever, unspecified 03/26/2014   Hx of acute pancreatitis 01/03/2014   Tobacco dependence 01/03/2014   Other and unspecified hyperlipidemia 01/03/2014   GERD (gastroesophageal reflux disease) 11/22/2013   Essential hypertension,  benign 10/25/2013   Leukocytosis, unspecified 10/25/2013    Immunization History  Administered Date(s) Administered   Pneumococcal Conjugate-13 01/15/2016   Pneumococcal Polysaccharide-23 09/12/2018    CGM report:       -Dr. Regis Bill needs to do a foot exam and urine microalbumin  Patient reports he misunderstood and was only taking 24 units of Tresiba every morning. The last couple of days he has been a lot more hungry and eating a lot more.  Patient had a bowl of frosted flakes for breakfast the last couple of days and wasn't aware of how much sugar was in there. He will try to cut back on this and also add protein (eggs) to his breakfast to help keep him fuller longer. He usually injects [place] around 8:30-9am before breakfast but the last few days he has been eating breakfast around 5-6am. He usually goes to bed around 12-1am and eats dinner around 8-9pm.  Conditions to be addressed/monitored:  Hypertension, Hyperlipidemia, Diabetes, GERD, Depression, Anxiety, and Tobacco use  Conditions addressed this visit: Hypertension, diabetes  Care Plan : [place]  Updates made by Viona Gilmore, [place] since 07/08/2022 12:00 AM     Problem: Problem: Hypertension, Hyperlipidemia, Diabetes, GERD, Depression, Anxiety, and Tobacco use      Long-Range Goal: Patient-Specific Goal   Start Date: 05/20/2022  Expected End Date:   Recent Progress: On track  Priority: High  Note:   Current Barriers:  Unable to independently monitor therapeutic efficacy Unable to achieve control of diabetes   Pharmacist Clinical Goal(s):  Patient will achieve adherence to monitoring guidelines and medication adherence to achieve therapeutic efficacy achieve control of diabetes as evidenced by A1c  through collaboration with PharmD and provider.   Interventions: 1:1 collaboration with Panosh, Standley Brooking, MD regarding development and update of comprehensive plan of care as evidenced by  provider attestation and co-signature Inter-disciplinary care team collaboration (see longitudinal plan of care) Comprehensive medication review performed; medication list updated in electronic medical record  Hypertension (BP goal <130/80) -Uncontrolled (too low) -Current treatment: Amlodipine-benazepril 2.5-10 mg 1 capsule daily - Appropriate, Query effective, Query Safe, Accessible -Medications previously tried: n/a  -Current home readings: 130/80, 120/80, 114/70 (checking daily and machine has a memory but he did not write down the readings -Current dietary habits: trying to limit salt intake -Current exercise habits: none -Denies hypotensive/hypertensive symptoms -Educated on BP goals and benefits of medications for prevention of heart attack, stroke and kidney damage; Importance of home blood pressure monitoring; Proper BP monitoring technique; -Counseled to monitor BP at home a few days a week, document, and provide log at future appointments -Recommended to continue current medication  Hyperlipidemia: (LDL goal < 70) -Uncontrolled -Current treatment: Atorvastatin  40 mg 1 tablet daily - not filled since last year - Appropriate, Query effective, Safe, Query accessible -Medications previously tried: none  -Current dietary patterns: working on diet -Current exercise habits: none -Educated on Cholesterol goals;  Benefits of statin for ASCVD risk reduction; Importance of limiting foods high in cholesterol; -Recommended restarting atorvastatin.  Diabetes (A1c goal <7%) -Uncontrolled -Current medications: Jardiance 25 mg 1 tablet daily - Appropriate, Query effective, Safe, Accessible Tresiba inject 24 units once daily - Appropriate, Query effective, Safe, Accessible Novolog 6 units three times daily - Appropriate, Query effective, Safe, Accessible -Medications previously tried: n/a  -Current home glucose readings fasting glucose: not checking post prandial glucose: not  checking -Denies hypoglycemic/hyperglycemic symptoms -Current meal patterns:  breakfast: bowl of cereal, eggs and  cheese or sandwich with jelly, boost with each meal  lunch: ham sandwich, left overs (peas, corn), greens every day, broccoli dinner: baked chicken, rice, peas and corn snacks: bugles, peanut butter & jelly sandwich, jolly ranchers drinks: boost, diet soda, plenty of water with crystal lite in water - Yeti 30 ounces 3-4 times, boost with every meal  -Current exercise: none right now -Educated on A1c and blood sugar goals; Complications of diabetes including kidney damage, retinal damage, and cardiovascular disease; Exercise goal of 150 minutes per week; Benefits of routine self-monitoring of blood sugar; Continuous glucose monitoring; -Counseled to check feet daily and get yearly eye exams -Recommended increasing Novolog to 8 units three times daily.  Depression/Anxiety (Goal: minimize symptoms) -Uncontrolled -Current treatment: Citalopram 20 mg 1 tablet daily - Appropriate, Query effective, Safe, Accessible Lorazepam 1 mg 1 tablet as needed for panic attacks - Query Appropriate, Query effective, Query Safe, Accessible -Medications previously tried/failed: n/a -PHQ9: 46 -GAD7: n/a -Educated on Benefits of medication for symptom control Benefits of cognitive-behavioral therapy with or without medication -Recommended to continue current medication  Tobacco use (Goal quit smoking) -Uncontrolled -Previous quit attempts: unknown -Current treatment  No medications -Patient smokes Within 30 minutes of waking -Patient triggers include:  habit -On a scale of 1-10, reports MOTIVATION to quit is 1 -On a scale of 1-10, reports CONFIDENCE in quitting is 1 -Provided contact information for [place] Quit Line (1-800-QUIT-NOW) and encouraged patient to reach out to this group for support. - Will assess at next visit.  Alcohol abuse (Goal: minimize symptoms of  withdrawal) -Controlled -Current treatment  Thiamine 100 mg 2 tablets twice daily - Appropriate, Effective, Safe, Accessible Acamprosate 333 mg 2 tablets three times daily - Appropriate, Effective, Safe, Accessible -Medications previously tried: none  -Recommended to continue current medication  GERD/Digestion (Goal: minimize symptoms) -Not ideally controlled -Current treatment  Pantoprazole 40 mg 1 tablet daily - Appropriate, Effective, Safe, Accessible Creon 36000 units three times daily with meals  - Appropriate, Query effective, Safe, Accessible -Medications previously tried: n/a  -Recommended to continue current medication  Neuropathy (Goal: minimize symptoms) -Uncontrolled -Current treatment  Gabapentin 300 mg 1 tablet three times daily - Query Appropriate, Query effective, Safe, Accessible -Medications previously tried: n/a  - Patient is unsure why he is on this medication.  Insomnia (Goal: improve quality and quantity of sleep) -Controlled -Current treatment  Trazodone 50 mg 1 tablet at bedtime - Appropriate, Effective, Safe, Accessible -Medications previously tried: none  -Recommended to continue current medication   Health Maintenance -Vaccine gaps: tetanus, influenza, COVID -Current therapy:  Folic acid 1 mg 1 tablet daily Multivitamin 1 tablet daily Vitamin B12 1 tablet daily Lumify 0.025% as needed Albuterol HFA as needed Furosemide 20 mg 1 tablet daily Vitamin B1 100 mg daily Nephrovite daily -Educated on Herbal supplement research is limited and benefits usually cannot be proven Cost vs benefit of each product must be carefully weighed by individual consumer -Patient is satisfied with current therapy and denies issues -Recommended to continue current medication  Patient Goals/Self-Care Activities Patient will:  - take medications as prescribed as evidenced by patient report and record review check glucose daily, document, and provide at future  appointments check blood pressure every other day, document, and provide at future appointments  Follow Up Plan: The care management team will reach out to the patient again over the next 30 days.       Medication Assistance: None required.  Patient affirms current coverage meets needs.  Compliance/Adherence/Medication  fill history: Care Gaps: Tetanus, foot exam, influenza, COVID, urine microalbumin Last BP - 122/76 on 04/27/2022 Last A1C - 8.3 on 05/09/2022   Star-Rating Drugs: Amlodipine Benazepril 2.5/10 mg - last [facility] Atorvastatin 40 mg - last filled [facility]/02/2021 [facility] verified with [facility] Jardiance 25 mg - last filled [facility]22/2022 [facility]  [facility]  [facility] [place] [address] [place]Phone: [phone]: [phone] [facility] (New Address) - [place] [address] [place]Phone: [phone]: [phone] Uses pill box? No - feels like he doesn't need it Pt endorses 80% compliance  We discussed: Benefits of medication synchronization, packaging and delivery as well as enhanced pharmacist oversight with [employer]. Patient decided to: Continue current medication management strategy  Care Plan and Follow Up Patient Decision:  Patient agrees to Care Plan and Follow-up.  Plan: The care management team will reach out to the patient again over the next 30 days.  MJeni Salles PharmD, BMadronePharmacist [place]

## 2022-07-08 NOTE — Patient Instructions (Signed)
[place],  Keep on taking the 24 units of Tresiba and increase your Novolog to 8 units three times daily.  Please reach out to me if you have any questions or need anything before our follow up!  Best, Maddie  Jeni Salles, PharmD, [place] Pharmacist [place] [facility] [place]

## 2022-07-09 ENCOUNTER — Ambulatory Visit: Payer: Medicare Other | Admitting: Internal Medicine

## 2022-07-14 ENCOUNTER — Other Ambulatory Visit: Payer: Self-pay | Admitting: Internal Medicine

## 2022-07-27 ENCOUNTER — Encounter: Payer: Medicare Other | Admitting: Internal Medicine

## 2022-07-30 ENCOUNTER — Ambulatory Visit: Payer: Medicare Other | Admitting: Internal Medicine

## 2022-08-04 DIAGNOSIS — Z7984 Long term (current) use of oral hypoglycemic drugs: Secondary | ICD-10-CM | POA: Diagnosis not present

## 2022-08-04 DIAGNOSIS — E1165 Type 2 diabetes mellitus with hyperglycemia: Secondary | ICD-10-CM

## 2022-08-04 DIAGNOSIS — E785 Hyperlipidemia, unspecified: Secondary | ICD-10-CM

## 2022-08-04 DIAGNOSIS — F32A Depression, unspecified: Secondary | ICD-10-CM

## 2022-08-04 DIAGNOSIS — I10 Essential (primary) hypertension: Secondary | ICD-10-CM | POA: Diagnosis not present

## 2022-08-04 DIAGNOSIS — F17210 Nicotine dependence, cigarettes, uncomplicated: Secondary | ICD-10-CM

## 2022-09-08 ENCOUNTER — Other Ambulatory Visit: Payer: Self-pay | Admitting: Internal Medicine

## 2022-10-09 ENCOUNTER — Ambulatory Visit: Payer: Medicare Other | Admitting: Family Medicine

## 2022-10-09 ENCOUNTER — Encounter: Payer: Self-pay | Admitting: Family Medicine

## 2022-10-09 VITALS — BP 94/60 | HR 94 | Temp 98.2°F | Wt 140.0 lb

## 2022-10-09 DIAGNOSIS — I10 Essential (primary) hypertension: Secondary | ICD-10-CM

## 2022-10-09 DIAGNOSIS — I739 Peripheral vascular disease, unspecified: Secondary | ICD-10-CM

## 2022-10-09 DIAGNOSIS — E1165 Type 2 diabetes mellitus with hyperglycemia: Secondary | ICD-10-CM | POA: Diagnosis not present

## 2022-10-09 LAB — POCT GLYCOSYLATED HEMOGLOBIN (HGB A1C): Hemoglobin A1C: 9.1 % — AB (ref 4.0–5.6)

## 2022-10-09 MED ORDER — EMPAGLIFLOZIN 25 MG PO TABS: 25.0000 mg | ORAL_TABLET | Freq: Every day | ORAL | 0 refills | Status: DC

## 2022-10-09 MED ORDER — TRAZODONE HCL 50 MG PO TABS: 50.0000 mg | ORAL_TABLET | Freq: Every day | ORAL | 0 refills | Status: DC

## 2022-10-09 MED ORDER — GABAPENTIN 300 MG PO CAPS: 600.0000 mg | ORAL_CAPSULE | Freq: Three times a day (TID) | ORAL | 0 refills | Status: DC

## 2022-10-09 MED ORDER — BENAZEPRIL HCL 10 MG PO TABS: 10.0000 mg | ORAL_TABLET | Freq: Every day | ORAL | 0 refills | Status: DC

## 2022-10-09 MED ORDER — TRESIBA FLEXTOUCH 100 UNIT/ML ~~LOC~~ SOPN: 30.0000 [IU] | PEN_INJECTOR | Freq: Every day | SUBCUTANEOUS | 0 refills | Status: DC

## 2022-10-09 MED ORDER — INSULIN PEN NEEDLE 32G X 4 MM MISC: 1.0000 | Freq: Every day | 0 refills | Status: DC

## 2022-10-09 NOTE — Progress Notes (Signed)
   Subjective:    Patient ID: Todd Rodriguez, male    DOB: Jun 13, 1976, 47 y.o.   MRN: [id number]  HPI Here with his wife to follow up on diabetes, HTN, and joint pains, and he wants [place] to check his right 2nd toe. This toe began to turn a dark color gradually about 3 months ago. No hx of trauma there is no pain. He denies any numbness or tingling or burning in the feet. His glucoses are quite labile, though they have been trending high overnight. His am fasting glucoses have averaged 200-300, but they are frequently dropping too low during the day. Even though he has decreased the dose of Novolog to 2 units with meals, this will frequently drop into the 40's, and he will have to drink a soda to get them back up. His BP has also been dropping low in the 80's over 50's. He has lost a lot of weight over the past 6 months, so this is not surprising. He complains of joint pains all over his body, but the only pain medication he can take OTC is Tylenol.    Review of Systems  Constitutional: Negative.   Respiratory: Negative.    Cardiovascular: Negative.   Gastrointestinal: Negative.   Genitourinary: Negative.   Skin:  Positive for color change.  Neurological: Negative.        Objective:   Physical Exam Constitutional:      Appearance: Normal appearance.     Comments: Walks with a cane   Cardiovascular:     Rate and Rhythm: Normal rate and regular rhythm.     Heart sounds: Normal heart sounds.  Pulmonary:     Effort: Pulmonary effort is normal.     Breath sounds: Normal breath sounds.  Musculoskeletal:     Comments: Both feet are pain and warm to the touch, but all his toes are cool. PT and DP pulses are absent. The right 2nd toe is a uniform dark brown color which is darker than the surrounding skin. The toe os not tender. There is no skin breakdown.   Neurological:     Mental Status: He is alert and oriented to person, place, and time.     Comments: Sensation to light touch is intact on  both feet            Assessment & Plan:  His diabetes is somewhat controlled, but the glucoses are quite labile. We will stop all doses of the Novolog, and we will increase the Tresiba to 30 units every morning. He will stay on Jardiance 25 mg daily. His HTN is now overtreated, so we will stop the Amlodipine and he will take only Benazepril 10 mg daily. Finally he has evidence of PAD in the feet and especially in the right 2nd toe. We will set up arterial dopplers to assess the circulation in the lower extremities.  Alysia Penna, MD

## 2022-10-13 ENCOUNTER — Ambulatory Visit: Payer: Medicare Other | Admitting: Internal Medicine

## 2022-10-19 ENCOUNTER — Ambulatory Visit (HOSPITAL_COMMUNITY)
Admission: RE | Admit: 2022-10-19 | Payer: Medicare Other | Source: Ambulatory Visit | Attending: Family Medicine | Admitting: Family Medicine

## 2022-10-23 ENCOUNTER — Ambulatory Visit (HOSPITAL_COMMUNITY)
Admission: RE | Admit: 2022-10-23 | Discharge: 2022-10-23 | Disposition: A | Discharge status: Home / Self Care | Payer: Medicare Other | Source: Ambulatory Visit | Attending: Cardiology | Admitting: Cardiology

## 2022-10-23 DIAGNOSIS — I739 Peripheral vascular disease, unspecified: Secondary | ICD-10-CM

## 2022-10-23 LAB — VAS US LOWER EXT ART SEG MULTI (SEGMENTALS & LE RAYNAUDS)
Left ABI: 1.05
Right ABI: 1.11

## 2022-11-11 ENCOUNTER — Ambulatory Visit: Payer: Medicare Other | Admitting: Internal Medicine

## 2022-12-15 ENCOUNTER — Other Ambulatory Visit: Payer: Self-pay | Admitting: Family Medicine

## 2022-12-21 ENCOUNTER — Encounter: Payer: Self-pay | Admitting: Internal Medicine

## 2022-12-21 NOTE — Telephone Encounter (Signed)
error 

## 2022-12-22 ENCOUNTER — Encounter: Payer: Self-pay | Admitting: Family Medicine

## 2022-12-22 ENCOUNTER — Ambulatory Visit: Payer: Medicare Other | Admitting: Family Medicine

## 2022-12-22 VITALS — BP 118/82 | HR 106 | Temp 97.9°F | Wt 183.4 lb

## 2022-12-22 DIAGNOSIS — I10 Essential (primary) hypertension: Secondary | ICD-10-CM

## 2022-12-22 DIAGNOSIS — R609 Edema, unspecified: Secondary | ICD-10-CM | POA: Diagnosis not present

## 2022-12-22 MED ORDER — FUROSEMIDE 20 MG PO TABS: 40.0000 mg | ORAL_TABLET | Freq: Two times a day (BID) | ORAL | 0 refills | Status: DC

## 2022-12-22 NOTE — Progress Notes (Signed)
   Subjective:    Patient ID: Todd Rodriguez, male    DOB: 1975/12/26, 47 y.o.   MRN: [id number]  HPI Here for swelling in his legs and abdomen that started fairly suddenly 4 days ago. It started in the feet and lower legs, then it came up to the upper legs, and then his abdomen and scrotum. He also has some SOB today. No chest pain. His BP has been stable. No recent medication changes. He normally takes 20 mg of Lasix once a day, but he says lately it has not been making him urinate the way it used to.    Review of Systems  Constitutional: Negative.   Respiratory:  Positive for shortness of breath. Negative for cough and wheezing.   Cardiovascular:  Positive for leg swelling. Negative for chest pain and palpitations.       Objective:   Physical Exam Constitutional:      Appearance: Normal appearance.     Comments: Walks with a cane   Cardiovascular:     Rate and Rhythm: Regular rhythm. Tachycardia present.     Pulses: Normal pulses.     Heart sounds: Normal heart sounds.  Pulmonary:     Effort: Pulmonary effort is normal.     Breath sounds: No wheezing, rhonchi or rales.     Comments: BS are absent in the lower lungs on each side  Musculoskeletal:     Comments: He has 2 + edema in both legs up to the pelvis. There is also swelling in the abdomen, scrotum, and lower back   Neurological:     Mental Status: He is alert.           Assessment & Plan:  He seems to be in some CHF. We will get labs today including BMET and BNP. Set up an ECHO soon. We will increase the Lasix to take 40 mg BID. Get a CXR today. We spent a total of ( 33  ) minutes reviewing records and discussing these issues.  Alysia Penna, MD

## 2022-12-23 ENCOUNTER — Other Ambulatory Visit: Payer: Self-pay

## 2022-12-23 ENCOUNTER — Telehealth: Payer: Self-pay | Admitting: Internal Medicine

## 2022-12-23 LAB — HEPATIC FUNCTION PANEL
ALT: 15 U/L (ref 0–53)
AST: 23 U/L (ref 0–37)
Albumin: 2.5 g/dL — ABNORMAL LOW (ref 3.5–5.2)
Alkaline Phosphatase: 208 U/L — ABNORMAL HIGH (ref 39–117)
Bilirubin, Direct: 1.2 mg/dL — ABNORMAL HIGH (ref 0.0–0.3)
Total Bilirubin: 2.4 mg/dL — ABNORMAL HIGH (ref 0.2–1.2)
Total Protein: 5.9 g/dL — ABNORMAL LOW (ref 6.0–8.3)

## 2022-12-23 LAB — CBC WITH DIFFERENTIAL/PLATELET
Basophils Absolute: 0.1 10*3/uL (ref 0.0–0.1)
Basophils Relative: 0.9 % (ref 0.0–3.0)
Eosinophils Absolute: 0 10*3/uL (ref 0.0–0.7)
Eosinophils Relative: 0.6 % (ref 0.0–5.0)
HCT: 35.3 % — ABNORMAL LOW (ref 39.0–52.0)
Hemoglobin: 11.5 g/dL — ABNORMAL LOW (ref 13.0–17.0)
Lymphocytes Relative: 20.4 % (ref 12.0–46.0)
Lymphs Abs: 1.5 10*3/uL (ref 0.7–4.0)
MCHC: 32.5 g/dL (ref 30.0–36.0)
MCV: 97.4 fl (ref 78.0–100.0)
Monocytes Absolute: 0.6 10*3/uL (ref 0.1–1.0)
Monocytes Relative: 9 % (ref 3.0–12.0)
Neutro Abs: 5 10*3/uL (ref 1.4–7.7)
Neutrophils Relative %: 69.1 % (ref 43.0–77.0)
Platelets: 221 10*3/uL (ref 150.0–400.0)
RBC: 3.63 Mil/uL — ABNORMAL LOW (ref 4.22–5.81)
RDW: 16.3 % — ABNORMAL HIGH (ref 11.5–15.5)
WBC: 7.2 10*3/uL (ref 4.0–10.5)

## 2022-12-23 LAB — BASIC METABOLIC PANEL
BUN: 6 mg/dL (ref 6–23)
CO2: 24 mEq/L (ref 19–32)
Calcium: 7.8 mg/dL — ABNORMAL LOW (ref 8.4–10.5)
Chloride: 101 mEq/L (ref 96–112)
Creatinine, Ser: 0.7 mg/dL (ref 0.40–1.50)
GFR: 108.77 mL/min (ref >60.00)
Glucose, Bld: 269 mg/dL — ABNORMAL HIGH (ref 70–99)
Potassium: 3.2 mEq/L — ABNORMAL LOW (ref 3.5–5.1)
Sodium: 134 mEq/L — ABNORMAL LOW (ref 135–145)

## 2022-12-23 LAB — TSH: TSH: 2.42 u[IU]/mL (ref 0.35–5.50)

## 2022-12-23 LAB — BRAIN NATRIURETIC PEPTIDE: Pro B Natriuretic peptide (BNP): 115 pg/mL — ABNORMAL HIGH (ref 0.0–100.0)

## 2022-12-23 MED ORDER — ALBUTEROL SULFATE HFA 108 (90 BASE) MCG/ACT IN AERS: 2.0000 | INHALATION_SPRAY | RESPIRATORY_TRACT | 1 refills | Status: DC | PRN

## 2022-12-23 NOTE — Telephone Encounter (Signed)
Prescription Request  12/23/2022  LOV: 04/27/2022  What is the name of the medication or equipment? albuterol (VENTOLIN HFA) 108 (90 Base) MCG/ACT inhaler . Pt seen dr fry on 12-22-2022  Have you contacted your pharmacy to request a refill? Yes   Which pharmacy would you like this sent to?  [id number] [address] Sheriff, [place] [address] [place] [id number] Phone: [phone] Fax: [phone]   Patient notified that their request is being sent to the clinical staff for review and that they should receive a response within 2 business days.   Please advise at Mobile [phone] (mobile)

## 2022-12-23 NOTE — Telephone Encounter (Signed)
Rx sent as requested.

## 2022-12-24 MED ORDER — POTASSIUM CHLORIDE CRYS ER 20 MEQ PO TBCR: 20.0000 meq | EXTENDED_RELEASE_TABLET | Freq: Every day | ORAL | 2 refills | Status: DC

## 2022-12-24 NOTE — Addendum Note (Signed)
Addended by: Agnes Lawrence on: 12/24/2022 02:04 PM   Modules accepted: Orders

## 2022-12-27 ENCOUNTER — Inpatient Hospital Stay (HOSPITAL_COMMUNITY): Payer: Medicare Other

## 2022-12-27 ENCOUNTER — Emergency Department (HOSPITAL_COMMUNITY): Payer: Medicare Other

## 2022-12-27 ENCOUNTER — Encounter (HOSPITAL_COMMUNITY): Payer: Self-pay

## 2022-12-27 ENCOUNTER — Inpatient Hospital Stay (HOSPITAL_COMMUNITY)
Admission: EM | Admit: 2022-12-27 | Discharge: 2022-12-31 | DRG: 433 | Disposition: A | Discharge status: Home / Self Care | Payer: Medicare Other | Attending: Internal Medicine | Admitting: Internal Medicine

## 2022-12-27 ENCOUNTER — Other Ambulatory Visit: Payer: Self-pay

## 2022-12-27 DIAGNOSIS — K860 Alcohol-induced chronic pancreatitis: Secondary | ICD-10-CM | POA: Diagnosis not present

## 2022-12-27 DIAGNOSIS — R188 Other ascites: Secondary | ICD-10-CM | POA: Diagnosis present

## 2022-12-27 DIAGNOSIS — K8681 Exocrine pancreatic insufficiency: Secondary | ICD-10-CM | POA: Diagnosis present

## 2022-12-27 DIAGNOSIS — J9811 Atelectasis: Secondary | ICD-10-CM | POA: Diagnosis present

## 2022-12-27 DIAGNOSIS — E876 Hypokalemia: Secondary | ICD-10-CM | POA: Diagnosis present

## 2022-12-27 DIAGNOSIS — F17200 Nicotine dependence, unspecified, uncomplicated: Secondary | ICD-10-CM | POA: Diagnosis not present

## 2022-12-27 DIAGNOSIS — E785 Hyperlipidemia, unspecified: Secondary | ICD-10-CM | POA: Diagnosis present

## 2022-12-27 DIAGNOSIS — Z833 Family history of diabetes mellitus: Secondary | ICD-10-CM

## 2022-12-27 DIAGNOSIS — R0609 Other forms of dyspnea: Secondary | ICD-10-CM | POA: Diagnosis not present

## 2022-12-27 DIAGNOSIS — E871 Hypo-osmolality and hyponatremia: Secondary | ICD-10-CM | POA: Diagnosis present

## 2022-12-27 DIAGNOSIS — R197 Diarrhea, unspecified: Secondary | ICD-10-CM | POA: Diagnosis present

## 2022-12-27 DIAGNOSIS — K760 Fatty (change of) liver, not elsewhere classified: Secondary | ICD-10-CM | POA: Diagnosis present

## 2022-12-27 DIAGNOSIS — E1169 Type 2 diabetes mellitus with other specified complication: Secondary | ICD-10-CM | POA: Diagnosis present

## 2022-12-27 DIAGNOSIS — Z1152 Encounter for screening for COVID-19: Secondary | ICD-10-CM

## 2022-12-27 DIAGNOSIS — F17210 Nicotine dependence, cigarettes, uncomplicated: Secondary | ICD-10-CM | POA: Diagnosis present

## 2022-12-27 DIAGNOSIS — K863 Pseudocyst of pancreas: Secondary | ICD-10-CM | POA: Diagnosis present

## 2022-12-27 DIAGNOSIS — F1010 Alcohol abuse, uncomplicated: Secondary | ICD-10-CM | POA: Diagnosis present

## 2022-12-27 DIAGNOSIS — Z79899 Other long term (current) drug therapy: Secondary | ICD-10-CM

## 2022-12-27 DIAGNOSIS — R601 Generalized edema: Secondary | ICD-10-CM

## 2022-12-27 DIAGNOSIS — K219 Gastro-esophageal reflux disease without esophagitis: Secondary | ICD-10-CM | POA: Diagnosis present

## 2022-12-27 DIAGNOSIS — F1020 Alcohol dependence, uncomplicated: Secondary | ICD-10-CM | POA: Diagnosis present

## 2022-12-27 DIAGNOSIS — Z8719 Personal history of other diseases of the digestive system: Secondary | ICD-10-CM

## 2022-12-27 DIAGNOSIS — Z794 Long term (current) use of insulin: Secondary | ICD-10-CM

## 2022-12-27 DIAGNOSIS — K7460 Unspecified cirrhosis of liver: Secondary | ICD-10-CM | POA: Diagnosis present

## 2022-12-27 DIAGNOSIS — Z981 Arthrodesis status: Secondary | ICD-10-CM

## 2022-12-27 DIAGNOSIS — K861 Other chronic pancreatitis: Secondary | ICD-10-CM | POA: Diagnosis present

## 2022-12-27 DIAGNOSIS — E1165 Type 2 diabetes mellitus with hyperglycemia: Secondary | ICD-10-CM | POA: Diagnosis not present

## 2022-12-27 DIAGNOSIS — J90 Pleural effusion, not elsewhere classified: Secondary | ICD-10-CM | POA: Diagnosis present

## 2022-12-27 DIAGNOSIS — E11649 Type 2 diabetes mellitus with hypoglycemia without coma: Secondary | ICD-10-CM | POA: Diagnosis present

## 2022-12-27 DIAGNOSIS — Z8249 Family history of ischemic heart disease and other diseases of the circulatory system: Secondary | ICD-10-CM | POA: Diagnosis not present

## 2022-12-27 DIAGNOSIS — I10 Essential (primary) hypertension: Secondary | ICD-10-CM | POA: Diagnosis present

## 2022-12-27 DIAGNOSIS — R161 Splenomegaly, not elsewhere classified: Secondary | ICD-10-CM | POA: Diagnosis present

## 2022-12-27 DIAGNOSIS — Z91148 Patient's other noncompliance with medication regimen for other reason: Secondary | ICD-10-CM

## 2022-12-27 DIAGNOSIS — Z7984 Long term (current) use of oral hypoglycemic drugs: Secondary | ICD-10-CM

## 2022-12-27 LAB — CBG MONITORING, ED: Glucose-Capillary: 73 mg/dL (ref 70–99)

## 2022-12-27 LAB — URINALYSIS, ROUTINE W REFLEX MICROSCOPIC
Bacteria, UA: NONE SEEN
Bilirubin Urine: NEGATIVE
Glucose, UA: NEGATIVE mg/dL
Ketones, ur: NEGATIVE mg/dL
Leukocytes,Ua: NEGATIVE
Nitrite: NEGATIVE
Protein, ur: NEGATIVE mg/dL
Specific Gravity, Urine: 1.005 (ref 1.005–1.030)
pH: 6 (ref 5.0–8.0)

## 2022-12-27 LAB — DIFFERENTIAL
Abs Immature Granulocytes: 0.02 10*3/uL (ref 0.00–0.07)
Basophils Absolute: 0 10*3/uL (ref 0.0–0.1)
Basophils Relative: 1 %
Eosinophils Absolute: 0.1 10*3/uL (ref 0.0–0.5)
Eosinophils Relative: 1 %
Immature Granulocytes: 0 %
Lymphocytes Relative: 26 %
Lymphs Abs: 1.5 10*3/uL (ref 0.7–4.0)
Monocytes Absolute: 0.5 10*3/uL (ref 0.1–1.0)
Monocytes Relative: 9 %
Neutro Abs: 3.5 10*3/uL (ref 1.7–7.7)
Neutrophils Relative %: 63 %

## 2022-12-27 LAB — GLUCOSE, CAPILLARY
Glucose-Capillary: 104 mg/dL — ABNORMAL HIGH (ref 70–99)
Glucose-Capillary: 149 mg/dL — ABNORMAL HIGH (ref 70–99)

## 2022-12-27 LAB — CBC
HCT: 37.1 % — ABNORMAL LOW (ref 39.0–52.0)
Hemoglobin: 12 g/dL — ABNORMAL LOW (ref 13.0–17.0)
MCH: 30.8 pg (ref 26.0–34.0)
MCHC: 32.3 g/dL (ref 30.0–36.0)
MCV: 95.1 fL (ref 80.0–100.0)
Platelets: 217 10*3/uL (ref 150–400)
RBC: 3.9 MIL/uL — ABNORMAL LOW (ref 4.22–5.81)
RDW: 14.8 % (ref 11.5–15.5)
WBC: 5.6 10*3/uL (ref 4.0–10.5)
nRBC: 0 % (ref 0.0–0.2)

## 2022-12-27 LAB — D-DIMER, QUANTITATIVE: D-Dimer, Quant: 1.88 ug/mL-FEU — ABNORMAL HIGH (ref 0.00–0.50)

## 2022-12-27 LAB — C DIFFICILE QUICK SCREEN W PCR REFLEX
C Diff antigen: NEGATIVE
C Diff interpretation: NOT DETECTED
C Diff toxin: NEGATIVE

## 2022-12-27 LAB — COMPREHENSIVE METABOLIC PANEL
ALT: 18 U/L (ref 0–44)
AST: 26 U/L (ref 15–41)
Albumin: 2.4 g/dL — ABNORMAL LOW (ref 3.5–5.0)
Alkaline Phosphatase: 181 U/L — ABNORMAL HIGH (ref 38–126)
Anion gap: 8 (ref 5–15)
BUN: <5 mg/dL — ABNORMAL LOW (ref 6–20)
CO2: 26 mmol/L (ref 22–32)
Calcium: 7.6 mg/dL — ABNORMAL LOW (ref 8.9–10.3)
Chloride: 99 mmol/L (ref 98–111)
Creatinine, Ser: 0.67 mg/dL (ref 0.61–1.24)
GFR, Estimated: >60 mL/min (ref >60)
Glucose, Bld: 95 mg/dL (ref 70–99)
Potassium: 2.8 mmol/L — ABNORMAL LOW (ref 3.5–5.1)
Sodium: 133 mmol/L — ABNORMAL LOW (ref 135–145)
Total Bilirubin: 2.1 mg/dL — ABNORMAL HIGH (ref 0.3–1.2)
Total Protein: 6.3 g/dL — ABNORMAL LOW (ref 6.5–8.1)

## 2022-12-27 LAB — PROTEIN / CREATININE RATIO, URINE
Creatinine, Urine: 36 mg/dL
Total Protein, Urine: <6 mg/dL

## 2022-12-27 LAB — RESP PANEL BY RT-PCR (RSV, FLU A&B, COVID)  RVPGX2
Influenza A by PCR: NEGATIVE
Influenza B by PCR: NEGATIVE
Resp Syncytial Virus by PCR: NEGATIVE
SARS Coronavirus 2 by RT PCR: NEGATIVE

## 2022-12-27 LAB — LIPASE, BLOOD: Lipase: 19 U/L (ref 11–51)

## 2022-12-27 LAB — BRAIN NATRIURETIC PEPTIDE: B Natriuretic Peptide: 55 pg/mL (ref 0.0–100.0)

## 2022-12-27 MED ORDER — POTASSIUM CHLORIDE CRYS ER 20 MEQ PO TBCR
40.0000 meq | EXTENDED_RELEASE_TABLET | Freq: Once | ORAL | Status: AC
  Administered 2022-12-27: 40 meq via ORAL
  Filled 2022-12-27: qty 2

## 2022-12-27 MED ORDER — IOHEXOL 350 MG/ML SOLN
75.0000 mL | Freq: Once | INTRAVENOUS | Status: AC | PRN
  Administered 2022-12-27: 75 mL via INTRAVENOUS

## 2022-12-27 MED ORDER — LORAZEPAM 1 MG PO TABS
1.0000 mg | ORAL_TABLET | ORAL | Status: AC | PRN
  Filled 2022-12-27: qty 1

## 2022-12-27 MED ORDER — THIAMINE MONONITRATE 100 MG PO TABS
100.0000 mg | ORAL_TABLET | Freq: Every day | ORAL | Status: DC
  Administered 2022-12-27 – 2022-12-31 (×5): 100 mg via ORAL
  Filled 2022-12-27 (×5): qty 1

## 2022-12-27 MED ORDER — THIAMINE HCL 100 MG/ML IJ SOLN: 100.0000 mg | Freq: Every day | INTRAMUSCULAR | Status: DC

## 2022-12-27 MED ORDER — CITALOPRAM HYDROBROMIDE 20 MG PO TABS: 20.0000 mg | ORAL_TABLET | Freq: Every day | ORAL | Status: DC

## 2022-12-27 MED ORDER — GABAPENTIN 300 MG PO CAPS
600.0000 mg | ORAL_CAPSULE | Freq: Three times a day (TID) | ORAL | Status: DC
  Administered 2022-12-27 – 2022-12-31 (×12): 600 mg via ORAL
  Filled 2022-12-27 (×12): qty 2

## 2022-12-27 MED ORDER — ACETAMINOPHEN 325 MG PO TABS: 650.0000 mg | ORAL_TABLET | Freq: Four times a day (QID) | ORAL | Status: DC | PRN

## 2022-12-27 MED ORDER — TRAZODONE HCL 50 MG PO TABS
50.0000 mg | ORAL_TABLET | Freq: Once | ORAL | Status: AC | PRN
  Administered 2022-12-27: 50 mg via ORAL
  Filled 2022-12-27: qty 1

## 2022-12-27 MED ORDER — ONDANSETRON HCL 4 MG PO TABS: 4.0000 mg | ORAL_TABLET | Freq: Four times a day (QID) | ORAL | Status: DC | PRN

## 2022-12-27 MED ORDER — ATORVASTATIN CALCIUM 40 MG PO TABS
40.0000 mg | ORAL_TABLET | Freq: Every day | ORAL | Status: DC
  Administered 2022-12-27 – 2022-12-31 (×5): 40 mg via ORAL
  Filled 2022-12-27 (×5): qty 1

## 2022-12-27 MED ORDER — FUROSEMIDE 10 MG/ML IJ SOLN
40.0000 mg | Freq: Once | INTRAMUSCULAR | Status: AC
  Administered 2022-12-27: 40 mg via INTRAVENOUS
  Filled 2022-12-27: qty 4

## 2022-12-27 MED ORDER — FOLIC ACID 1 MG PO TABS
1.0000 mg | ORAL_TABLET | Freq: Every day | ORAL | Status: DC
  Administered 2022-12-27 – 2022-12-31 (×5): 1 mg via ORAL
  Filled 2022-12-27 (×5): qty 1

## 2022-12-27 MED ORDER — ENOXAPARIN SODIUM 40 MG/0.4ML IJ SOSY
40.0000 mg | PREFILLED_SYRINGE | INTRAMUSCULAR | Status: DC
  Administered 2022-12-27 – 2022-12-30 (×4): 40 mg via SUBCUTANEOUS
  Filled 2022-12-27 (×4): qty 0.4

## 2022-12-27 MED ORDER — FUROSEMIDE 10 MG/ML IJ SOLN
40.0000 mg | Freq: Two times a day (BID) | INTRAMUSCULAR | Status: DC
  Administered 2022-12-27 – 2022-12-31 (×8): 40 mg via INTRAVENOUS
  Filled 2022-12-27 (×8): qty 4

## 2022-12-27 MED ORDER — INSULIN ASPART 100 UNIT/ML IJ SOLN
0.0000 [IU] | Freq: Three times a day (TID) | INTRAMUSCULAR | Status: DC
  Administered 2022-12-28: 3 [IU] via SUBCUTANEOUS
  Administered 2022-12-28 – 2022-12-29 (×2): 7 [IU] via SUBCUTANEOUS

## 2022-12-27 MED ORDER — LORAZEPAM 2 MG/ML IJ SOLN: 1.0000 mg | INTRAMUSCULAR | Status: AC | PRN

## 2022-12-27 MED ORDER — INSULIN ASPART 100 UNIT/ML IJ SOLN
0.0000 [IU] | Freq: Every day | INTRAMUSCULAR | Status: DC
  Administered 2022-12-28: 5 [IU] via SUBCUTANEOUS

## 2022-12-27 MED ORDER — ADULT MULTIVITAMIN W/MINERALS CH
1.0000 | ORAL_TABLET | Freq: Every day | ORAL | Status: DC
  Administered 2022-12-27 – 2022-12-31 (×5): 1 via ORAL
  Filled 2022-12-27 (×5): qty 1

## 2022-12-27 MED ORDER — ONDANSETRON HCL 4 MG/2ML IJ SOLN: 4.0000 mg | Freq: Four times a day (QID) | INTRAMUSCULAR | Status: DC | PRN

## 2022-12-27 MED ORDER — ACETAMINOPHEN 650 MG RE SUPP: 650.0000 mg | Freq: Four times a day (QID) | RECTAL | Status: DC | PRN

## 2022-12-27 MED ORDER — PANTOPRAZOLE SODIUM 40 MG PO TBEC
40.0000 mg | DELAYED_RELEASE_TABLET | Freq: Every day | ORAL | Status: DC
  Administered 2022-12-27 – 2022-12-31 (×5): 40 mg via ORAL
  Filled 2022-12-27 (×5): qty 1

## 2022-12-27 NOTE — Hospital Course (Addendum)
Todd Rodriguez was admitted to the hospital with the working diagnosis of anasarca.   47 year old male with a history of diabetes mellitus type 2, hypertension, alcohol abuse, upper GI bleed, tobacco abuse chronic pancreatitis with pseudocyst, GERD presenting with increasing lower extremity edema and dyspnea on exertion.  The patient states that for the past 2 weeks he has noted increasing lower extremity edema progressing to his upper thighs and into his scrotum and penis.  He recently saw his PCP who increased his furosemide to 40 mg twice daily 4 days prior to this admission.  He states that has not helped.  His dyspnea on exertion has worsened during this period of time, but he denies any coughing, hemoptysis, chest pain.  He denies any fevers or chills, but states that he feels cold all the time.  He states that he has gained 20 to 30 pounds in the past 2 weeks.  He denies any frank orthopnea or PND. Unfortunately, he continues to drink alcohol.  He states that he has been drinking 2 x 12 ounce beers daily.  He stated that he quit drinking his usual shots of vodka.  He continues to smoke cigarettes.  He denies any illicit drug use.  In addition, the patient has been complaining of loose stools, up to 10 bowel movements daily.  He denies hematochezia or melena.  He denies any frank abdominal pain but has some upper abdominal discomfort.  He denies any recent travels.  He states that he has not eaten any unusual or undercooked foods.  He has had some subjective fevers and chills.  There is no dysuria or hematuria.  In the ED, the patient was afebrile hemodynamically stable with oxygen saturation 100% room air.   WBC 5.6, hemoglobin 12.0, platelets 217,000.   Sodium 133, potassium 2.8, bicarbonate 26, serum creatinine 0.67.  Sars covid 19 negative  Urine analysis SG 1,005, negative protein, negative leukocytes.   Chest x-ray was poor inspiration but showed bibasilar atelectasis.    CT chest with no  evidence of pulmonary embolism.  Moderate bilateral pleural effusions. Severe upper abdominal ascites.  Likely hepatic cirrhosis.   EKG 88 bpm, normal axis, normal intervals, sinus rhythm with no significant ST segment or T wave changes.   The patient was given IV furosemide in the emergency department and admitted for further evaluation and treatment. 03/26 paracentesis 4L removed.  03/28 volume status continue to improve.  Patient will continue diuresis at home and will have close follow up as outpatient.

## 2022-12-27 NOTE — ED Triage Notes (Signed)
Pt presents with frequent hypoglycemia, abd swelling, and diarrhea, ShOB x 1 week. Pt recently had an increase in Lasix dose and added K supplement. Pt with a hx of DM and HTN.

## 2022-12-27 NOTE — ED Notes (Signed)
Pt given lunch meal

## 2022-12-27 NOTE — ED Provider Notes (Signed)
[facility] Provider Note   CSN: [id number] Arrival date & time: 12/27/22  1134     History {Add pertinent medical, surgical, social history, OB history to HPI:1} Chief Complaint  Patient presents with   Shortness of Breath   Abdominal Pain    Todd Rodriguez is a 47 y.o. male.  Patient has a history of diabetes and alcohol abuse.  He states he has not had any alcohol in 3 weeks.  Patient complains of swelling all the way up his legs into his groin.  He saw his doctor this week and they have increased his Lasix but it has not helped   Shortness of Breath Associated symptoms: abdominal pain   Abdominal Pain Associated symptoms: shortness of breath        Home Medications Prior to Admission medications   Medication Sig Start Date End Date Taking? Authorizing Provider  acamprosate (CAMPRAL) 333 MG tablet Take 666 mg by mouth 3 (three) times daily with meals.    [provider]  albuterol (VENTOLIN HFA) 108 (90 Base) MCG/ACT inhaler Inhale 2 puffs into the lungs every 4 (four) hours as needed for wheezing or shortness of breath. 12/23/22   Laurey Morale, MD  atorvastatin (LIPITOR) 40 MG tablet Take 1 tablet (40 mg total) by mouth daily. 07/03/21   Roxan Hockey, MD  B COMPLEX-C-FOLIC ACID PO Take 0.8 mg by mouth daily.    [provider]  BD PEN NEEDLE NANO 2ND GEN 32G X 4 MM MISC USE AS DIRECTED 12/15/22   Laurey Morale, MD  benazepril (LOTENSIN) 10 MG tablet Take 1 tablet (10 mg total) by mouth daily. 10/09/22   Laurey Morale, MD  Brimonidine Tartrate (LUMIFY) 0.025 % SOLN Place 1 drop into both eyes daily as needed (eye irritation/dry eyes.).    [provider]  citalopram (CELEXA) 20 MG tablet Take 10 mg per day for 1-2 weeks then increase to 20 mg per day or as directed Patient taking differently: Take 20 mg by mouth daily. 07/03/21   Roxan Hockey, MD  Continuous Blood Gluc Sensor (DEXCOM G6 SENSOR)  MISC 1 Device by Does not apply route as directed. 07/03/21   Roxan Hockey, MD  Continuous Blood Gluc Transmit (DEXCOM G6 TRANSMITTER) MISC 1 Device by Does not apply route as directed. 07/03/21   Roxan Hockey, MD  Cyanocobalamin (VITAMIN B-12 PO) Take 1 tablet by mouth daily.    [provider]  empagliflozin (JARDIANCE) 25 MG TABS tablet Take 1 tablet (25 mg total) by mouth daily before breakfast. 10/09/22   Laurey Morale, MD  folic acid (FOLVITE) 1 MG tablet Take 1 tablet (1 mg total) by mouth daily. 04/27/22   Panosh, Standley Brooking, MD  furosemide (LASIX) 20 MG tablet Take 2 tablets (40 mg total) by mouth 2 (two) times daily. 12/22/22   Laurey Morale, MD  gabapentin (NEURONTIN) 300 MG capsule Take 2 capsules (600 mg total) by mouth 3 (three) times daily. 10/09/22   Laurey Morale, MD  LORazepam (ATIVAN) 1 MG tablet Take 1 tablet (1 mg total) by mouth every 8 (eight) hours as needed for anxiety. Panic attacks 04/27/22   Panosh, Standley Brooking, MD  pantoprazole (PROTONIX) 40 MG tablet TAKE 1 TABLET BY MOUTH EVERY DAY 12/17/21   Eloise Harman, DO  potassium chloride SA (KLOR-CON M) 20 MEQ tablet Take 1 tablet (20 mEq total) by mouth daily. 12/24/22   Laurey Morale, MD  thiamine 100 MG tablet Take 1 tablet (100 mg total) by mouth daily. Patient taking differently: Take 200 mg by mouth in the morning and at bedtime. 04/27/22   Panosh, Standley Brooking, MD  traZODone (DESYREL) 50 MG tablet Take 1 tablet (50 mg total) by mouth at bedtime. 10/09/22   Laurey Morale, MD  TRESIBA FLEXTOUCH 100 UNIT/ML FlexTouch Pen INJECT 30 UNITS INTO THE SKIN DAILY 12/15/22   Laurey Morale, MD      Allergies    Patient has no known allergies.    Review of Systems   Review of Systems  Respiratory:  Positive for shortness of breath.   Gastrointestinal:  Positive for abdominal pain.    Physical Exam Updated Vital Signs BP (!) 136/102   Pulse 95   Temp 98.5 F (36.9 C) (Oral)   Resp (!) 21   Ht 6\' 2"  (1.88 m)   Wt 81.6  kg   SpO2 100%   BMI 23.11 kg/m  Physical Exam  ED Results / Procedures / Treatments   Labs (all labs ordered are listed, but only abnormal results are displayed) Labs Reviewed  COMPREHENSIVE METABOLIC PANEL - Abnormal; Notable for the following components:      Result Value   Sodium 133 (*)    Potassium 2.8 (*)    BUN <5 (*)    Calcium 7.6 (*)    Total Protein 6.3 (*)    Albumin 2.4 (*)    Alkaline Phosphatase 181 (*)    Total Bilirubin 2.1 (*)    All other components within normal limits  CBC - Abnormal; Notable for the following components:   RBC 3.90 (*)    Hemoglobin 12.0 (*)    HCT 37.1 (*)    All other components within normal limits  LIPASE, BLOOD  BRAIN NATRIURETIC PEPTIDE  DIFFERENTIAL  URINALYSIS, ROUTINE W REFLEX MICROSCOPIC  CBG MONITORING, ED    EKG EKG Interpretation  Date/Time:  Sunday December 27 2022 12:09:19 EDT Ventricular Rate:  88 PR Interval:  128 QRS Duration: 95 QT Interval:  357 QTC Calculation: 432 R Axis:   54 Text Interpretation: Sinus rhythm Low voltage, extremity and precordial leads Anteroseptal infarct, old Confirmed by Milton Ferguson [phone]) on 12/27/2022 1:09:30 PM  Radiology DG Chest Port 1 View  Result Date: 12/27/2022 CLINICAL DATA:  Shortness of breath. EXAM: PORTABLE CHEST 1 VIEW COMPARISON:  April 16, 2022. FINDINGS: The heart size and mediastinal contours are within normal limits. Hypoinflation of the lungs with minimal bibasilar subsegmental atelectasis. The visualized skeletal structures are unremarkable. IMPRESSION: Hypoinflation of the lungs with minimal bibasilar subsegmental atelectasis. Electronically Signed   By: Marijo Conception M.D.   On: 12/27/2022 12:30    Procedures Procedures  {Document cardiac monitor, telemetry assessment procedure when appropriate:1}  Medications Ordered in ED Medications  furosemide (LASIX) injection 40 mg (has no administration in time range)  potassium chloride SA (KLOR-CON M) CR tablet  40 mEq (has no administration in time range)    ED Course/ Medical Decision Making/ A&P   {   Click here for ABCD2, HEART and other calculatorsREFRESH Note before signing :1}                          Medical Decision Making Amount and/or Complexity of Data Reviewed Labs: ordered. Radiology: ordered. ECG/medicine tests: ordered.  Risk Prescription drug management. Decision regarding hospitalization.   Patient with anasarca and hypokalemia he will be admitted to  medicine  and diuresed  {Document critical care time when appropriate:1} {Document review of labs and clinical decision tools ie heart score, Chads2Vasc2 etc:1}  {Document your independent review of radiology images, and any outside records:1} {Document your discussion with family members, caretakers, and with consultants:1} {Document social determinants of health affecting pt's care:1} {Document your decision making why or why not admission, treatments were needed:1} Final Clinical Impression(s) / ED Diagnoses Final diagnoses:  Anasarca    Rx / DC Orders ED Discharge Orders     None

## 2022-12-27 NOTE — Progress Notes (Signed)
Pt transported to and from CT by this RN with no acute events. Bryson Corona Edd Fabian

## 2022-12-27 NOTE — H&P (Signed)
History and Physical    Patient: Todd Rodriguez [id number] DOB: Sep 18, 1976 DOA: 12/27/2022 DOS: the patient was seen and examined on 12/27/2022 PCP: Todd Medin, MD  Patient coming from: Home  Chief Complaint:  Chief Complaint  Patient presents with   Shortness of Breath   Abdominal Pain   HPI: Todd Rodriguez is a 47 year old male with a history of diabetes mellitus type 2, hypertension, alcohol abuse, upper GI bleed, tobacco abuse chronic pancreatitis with pseudocyst, GERD presenting with increasing lower extremity edema and dyspnea on exertion.  The patient states that for the past 2 weeks he has noted increasing lower extremity edema progressing to his upper thighs and into his scrotum and penis.  He recently saw his PCP who increased his furosemide to 40 mg twice daily 4 days prior to this admission.  He states that has not helped.  His dyspnea on exertion has worsened during this period of time, but he denies any coughing, hemoptysis, chest pain.  He denies any fevers or chills, but states that he feels cold all the time.  He states that he has gained 20 to 30 pounds in the past 2 weeks.  He denies any frank orthopnea or PND. Unfortunately, he continues to drink alcohol.  He states that he has been drinking 2 x 12 ounce beers daily.  He stated that he quit drinking his usual shots of vodka.  He continues to smoke cigarettes.  He denies any illicit drug use.  In addition, the patient has been complaining of loose stools, up to 10 bowel movements daily.  He denies hematochezia or melena.  He denies any frank abdominal pain but has some upper abdominal discomfort.  He denies any recent travels.  He states that he has not eaten any unusual or undercooked foods.  He has had some subjective fevers and chills.  There is no dysuria or hematuria.  In the ED, the patient was afebrile hemodynamically stable with oxygen saturation 100% room air.  WBC 5.6, hemoglobin 12.0, platelets 217,000.   Sodium 133, potassium 2.8, bicarbonate 26, serum creatinine 0.67.  Chest x-ray was poor inspiration but showed bibasilar atelectasis.  The patient was given IV furosemide in the emergency department and admitted for further evaluation and treatment.  Review of Systems: As mentioned in the history of present illness. All other systems reviewed and are negative. Past Medical History:  Diagnosis Date   Acute pancreatitis 10/25/2013   Arthritis    Diabetes mellitus without complication (HCC)    GERD (gastroesophageal reflux disease)    Hepatic steatosis    History of pancreatitis    Hypertension    Myalgia and myositis 04/21/2014   much better from illness presumed infectious    has disability form  will complete     Pancreatic pseudocyst    Past Surgical History:  Procedure Laterality Date   ANTERIOR CERVICAL DECOMP/DISCECTOMY FUSION N/A 01/12/2018   Procedure: Revision ACDF C4-5, removal of hardware C5-6, exploration of fusion C5-6;  Surgeon: [name], MD;  Location: [facility];  Service: Orthopedics;  Laterality: N/A;  3.5 hrs   BIOPSY  05/13/2020   Procedure: BIOPSY;  Surgeon: Eloise Harman, DO;  [facility];  Service: Endoscopy;;   BIOPSY  09/23/2020   Procedure: BIOPSY;  Surgeon: Eloise Harman, DO;  [facility];  Service: Endoscopy;;   COLONOSCOPY WITH PROPOFOL N/A 05/13/2020   Dr. Abbey Chatters: Nonbleeding internal hemorrhoids.  Few small mouth diverticula in the sigmoid colon.  2  cecal polyps, inflammatory.  Sigmoid colon polyp hyperplastic.  Localized area of granular mucosa in the transverse colon, status post biopsy which was unremarkable.  No microscopic colitis.  Plans for repeat colonoscopy in 5 years.   ESOPHAGEAL BRUSHING  05/13/2020   Procedure: ESOPHAGEAL BRUSHING;  Surgeon: Eloise Harman, DO;  [facility];  Service: Endoscopy;;  mid esophagus   ESOPHAGOGASTRODUODENOSCOPY (EGD) WITH PROPOFOL N/A 05/13/2020   Dr. Abbey Chatters: Moderately severe  esophagitis with no bleeding found in the middle third of the esophagus, suspicious for Candida esophagitis.  KOH was negative. Gastritis, duodenal polyp.  Duodenal biopsy benign.  Duodenal bulb biopsy with Brunner's gland hyperplasia and mild changes suggestive of peptic injury.  No celiac disease seen.  GE junction biopsy showed GERD with mild inflammation consisten   ESOPHAGOGASTRODUODENOSCOPY (EGD) WITH PROPOFOL N/A 09/23/2020   Procedure: ESOPHAGOGASTRODUODENOSCOPY (EGD) WITH PROPOFOL;  Surgeon: Eloise Harman, DO;  [facility];  Service: Endoscopy;  Laterality: N/A;  7:30am   ESOPHAGOGASTRODUODENOSCOPY (EGD) WITH PROPOFOL N/A 04/17/2022   Procedure: ESOPHAGOGASTRODUODENOSCOPY (EGD) WITH PROPOFOL;  Surgeon: Daneil Dolin, MD;  [facility];  Service: Endoscopy;  Laterality: N/A;   NECK SURGERY     2 disc   POLYPECTOMY  05/13/2020   Procedure: POLYPECTOMY;  Surgeon: Eloise Harman, DO;  [facility];  Service: Endoscopy;;   SPINAL FUSION     Social History:  reports that he has been smoking cigarettes. He has a 12.00 pack-year smoking history. He has been exposed to tobacco smoke. He has never used smokeless tobacco. He reports that he does not currently use alcohol. He reports that he does not use drugs.  No Known Allergies  Family History  Problem Relation Age of Onset   Diabetes Mother    Hypertension Mother    Diabetes Father    Hypertension Father    Arthritis Maternal Grandmother    Hypertension Maternal Grandfather    Diabetes Maternal Grandfather    Colon cancer Neg Hx    Pancreatitis Neg Hx    Pancreatic cancer Neg Hx     Prior to Admission medications   Medication Sig Start Date End Date Taking? Authorizing Provider  acamprosate (CAMPRAL) 333 MG tablet Take 666 mg by mouth 3 (three) times daily with meals.    [provider]  albuterol (VENTOLIN HFA) 108 (90 Base) MCG/ACT inhaler Inhale 2 puffs into the lungs every 4 (four)  hours as needed for wheezing or shortness of breath. 12/23/22   Laurey Morale, MD  atorvastatin (LIPITOR) 40 MG tablet Take 1 tablet (40 mg total) by mouth daily. 07/03/21   Roxan Hockey, MD  B COMPLEX-C-FOLIC ACID PO Take 0.8 mg by mouth daily.    [provider]  BD PEN NEEDLE NANO 2ND GEN 32G X 4 MM MISC USE AS DIRECTED 12/15/22   Laurey Morale, MD  benazepril (LOTENSIN) 10 MG tablet Take 1 tablet (10 mg total) by mouth daily. 10/09/22   Laurey Morale, MD  Brimonidine Tartrate (LUMIFY) 0.025 % SOLN Place 1 drop into both eyes daily as needed (eye irritation/dry eyes.).    [provider]  citalopram (CELEXA) 20 MG tablet Take 10 mg per day for 1-2 weeks then increase to 20 mg per day or as directed Patient taking differently: Take 20 mg by mouth daily. 07/03/21   Roxan Hockey, MD  Continuous Blood Gluc Sensor (DEXCOM G6 SENSOR) MISC 1 Device by Does not apply route as  directed. 07/03/21   Roxan Hockey, MD  Continuous Blood Gluc Transmit (DEXCOM G6 TRANSMITTER) MISC 1 Device by Does not apply route as directed. 07/03/21   Roxan Hockey, MD  Cyanocobalamin (VITAMIN B-12 PO) Take 1 tablet by mouth daily.    [provider]  empagliflozin (JARDIANCE) 25 MG TABS tablet Take 1 tablet (25 mg total) by mouth daily before breakfast. 10/09/22   Laurey Morale, MD  folic acid (FOLVITE) 1 MG tablet Take 1 tablet (1 mg total) by mouth daily. 04/27/22   Panosh, Standley Brooking, MD  furosemide (LASIX) 20 MG tablet Take 2 tablets (40 mg total) by mouth 2 (two) times daily. 12/22/22   Laurey Morale, MD  gabapentin (NEURONTIN) 300 MG capsule Take 2 capsules (600 mg total) by mouth 3 (three) times daily. 10/09/22   Laurey Morale, MD  LORazepam (ATIVAN) 1 MG tablet Take 1 tablet (1 mg total) by mouth every 8 (eight) hours as needed for anxiety. Panic attacks 04/27/22   Panosh, Standley Brooking, MD  pantoprazole (PROTONIX) 40 MG tablet TAKE 1 TABLET BY MOUTH EVERY DAY 12/17/21   Eloise Harman, DO   potassium chloride SA (KLOR-CON M) 20 MEQ tablet Take 1 tablet (20 mEq total) by mouth daily. 12/24/22   Laurey Morale, MD  thiamine 100 MG tablet Take 1 tablet (100 mg total) by mouth daily. Patient taking differently: Take 200 mg by mouth in the morning and at bedtime. 04/27/22   Panosh, Standley Brooking, MD  traZODone (DESYREL) 50 MG tablet Take 1 tablet (50 mg total) by mouth at bedtime. 10/09/22   Laurey Morale, MD  TRESIBA FLEXTOUCH 100 UNIT/ML FlexTouch Pen INJECT 30 UNITS INTO THE SKIN DAILY 12/15/22   Laurey Morale, MD    Physical Exam: Vitals:   12/27/22 1142 12/27/22 1146 12/27/22 1203  BP:  (!) 148/109 (!) 136/102  Pulse:  90 95  Resp:  14 (!) 21  Temp:  98.5 F (36.9 C)   TempSrc:  Oral   SpO2:  100% 100%  Weight: 81.6 kg    Height: 6\' 2"  (1.83 m)     47 year old male with a history of diabetes mellitus type 2, hypertension, alcohol abuse, upper GI bleed, tobacco abuse chronic pancreatitis with pseudocyst, GERD presenting with increasing lower extremity edema and dyspnea on exertion.  The patient states that for the past 2 weeks he has noted increasing lower extremity edema progressing to his upper thighs and into his scrotum and penis.  He recently saw his PCP who increased his furosemide to 40 mg twice daily 4 days prior to this admission.  He states that has not helped.  His dyspnea on exertion has worsened during this period of time, but he denies any coughing, hemoptysis, chest pain.  He denies any fevers or chills, but states that he feels cold all the time.  He states that he has gained 20 to 30 pounds in the past 2 weeks.  He denies any frank orthopnea or PND. Unfortunately, he continues to drink alcohol.  He states that he has been drinking 2 x 12 ounce beers daily.  He stated that he quit drinking his usual shots of vodka.  He continues to smoke cigarettes.  He denies any illicit drug use.  In addition, the patient has been complaining of loose stools, up to 10 bowel movements  daily.  He denies hematochezia or melena.  He denies any frank abdominal pain but has some upper abdominal discomfort.  He denies any recent travels.  He states that he has not eaten any unusual or undercooked foods.  He has had some subjective fevers and chills.  There is no dysuria or hematuria.  In the ED, the patient was afebrile hemodynamically stable with oxygen saturation 100% room air.  WBC 5.6, hemoglobin 12.0, platelets 217,000.  Sodium 133, potassium 2.8, bicarbonate 26, serum creatinine 0.67.  Chest x-ray was poor inspiration but showed bibasilar atelectasis.  The patient was given IV furosemide in the emergency department and admitted for further evaluation and treatment.  Data Reviewed: Data reviewed above in history  Assessment and Plan: Anasarca -Concerned about decompensated liver cirrhosis -The patient has a long history of alcohol usage and dependence -Abdominal ultrasound--diagnostic paracentesis if enough ascites -Echocardiogram -12/22/2022 TSH 2.42 -Urine protein creatinine ratio -04/16/2022 CT abdomen and pelvis showed marked fatty liver -Continue IV furosemide -Review of the medical record shows that the patient weighed 140 lbs during office visit on 10/09/2022.  His weight was 183 on his office visit 12/22/2022.  Alcohol abuse -Alcohol withdrawal protocol  Diarrhea -Stool pathogen panel -Check C. Difficile -likely related to pancreatic insufficiency vs portal hypertensive colopathy  Uncontrolled diabetes mellitus type 2 with hypoglycemia -10/09/2022 hemoglobin A1c 9.1 -NovoLog sliding scale -Holding glargine insulin for right now and monitor CBGs  Chronic pancreatitis ([place]) 04/16/22 CT A/P with contrast -chronic calcific pancreatitis with pseudocyst at the pancreatic tail measuring 5.5 x 4.7 x 4.9 (decreased from prior).  Patient has pancreatic enzyme replacement on his med list.  His wife previously reports he has been noncompliant with this at home.    Essential  hypertension, benign Holding amlodipine and benazepril to allow BP margin for diuresis   Hypokalemia -replete Check mag   Advance Care Planning: FULL  Consults: none  Family Communication: none  Severity of Illness: The appropriate patient status for this patient is INPATIENT. Inpatient status is judged to be reasonable and necessary in order to provide the required intensity of service to ensure the patient's safety. The patient's presenting symptoms, physical exam findings, and initial radiographic and laboratory data in the context of their chronic comorbidities is felt to place them at high risk for further clinical deterioration. Furthermore, it is not anticipated that the patient will be medically stable for discharge from the hospital within 2 midnights of admission.   * I certify that at the point of admission it is my clinical judgment that the patient will require inpatient hospital care spanning beyond 2 midnights from the point of admission due to high intensity of service, high risk for further deterioration and high frequency of surveillance required.*  Author: Orson Eva, MD 12/27/2022 2:22 PM  For on call review [email or web address].

## 2022-12-28 ENCOUNTER — Inpatient Hospital Stay (HOSPITAL_COMMUNITY): Payer: Medicare Other

## 2022-12-28 DIAGNOSIS — E1165 Type 2 diabetes mellitus with hyperglycemia: Secondary | ICD-10-CM | POA: Diagnosis not present

## 2022-12-28 DIAGNOSIS — R0609 Other forms of dyspnea: Secondary | ICD-10-CM | POA: Diagnosis not present

## 2022-12-28 DIAGNOSIS — F1010 Alcohol abuse, uncomplicated: Secondary | ICD-10-CM

## 2022-12-28 DIAGNOSIS — K863 Pseudocyst of pancreas: Secondary | ICD-10-CM | POA: Diagnosis not present

## 2022-12-28 DIAGNOSIS — R601 Generalized edema: Secondary | ICD-10-CM | POA: Diagnosis not present

## 2022-12-28 LAB — CBC
HCT: 32.2 % — ABNORMAL LOW (ref 39.0–52.0)
Hemoglobin: 10.1 g/dL — ABNORMAL LOW (ref 13.0–17.0)
MCH: 30.9 pg (ref 26.0–34.0)
MCHC: 31.4 g/dL (ref 30.0–36.0)
MCV: 98.5 fL (ref 80.0–100.0)
Platelets: 146 10*3/uL — ABNORMAL LOW (ref 150–400)
RBC: 3.27 MIL/uL — ABNORMAL LOW (ref 4.22–5.81)
RDW: 14.7 % (ref 11.5–15.5)
WBC: 3.9 10*3/uL — ABNORMAL LOW (ref 4.0–10.5)
nRBC: 0 % (ref 0.0–0.2)

## 2022-12-28 LAB — COMPREHENSIVE METABOLIC PANEL
ALT: 16 U/L (ref 0–44)
AST: 23 U/L (ref 15–41)
Albumin: 1.9 g/dL — ABNORMAL LOW (ref 3.5–5.0)
Alkaline Phosphatase: 143 U/L — ABNORMAL HIGH (ref 38–126)
Anion gap: 7 (ref 5–15)
BUN: <5 mg/dL — ABNORMAL LOW (ref 6–20)
CO2: 26 mmol/L (ref 22–32)
Calcium: 7.1 mg/dL — ABNORMAL LOW (ref 8.9–10.3)
Chloride: 101 mmol/L (ref 98–111)
Creatinine, Ser: 0.76 mg/dL (ref 0.61–1.24)
GFR, Estimated: >60 mL/min (ref >60)
Glucose, Bld: 293 mg/dL — ABNORMAL HIGH (ref 70–99)
Potassium: 3.1 mmol/L — ABNORMAL LOW (ref 3.5–5.1)
Sodium: 134 mmol/L — ABNORMAL LOW (ref 135–145)
Total Bilirubin: 1.8 mg/dL — ABNORMAL HIGH (ref 0.3–1.2)
Total Protein: 5 g/dL — ABNORMAL LOW (ref 6.5–8.1)

## 2022-12-28 LAB — ECHOCARDIOGRAM COMPLETE
Area-P 1/2: 6.83 cm2
Calc EF: 38.1 %
Height: 74 in
S' Lateral: 3.1 cm
Single Plane A2C EF: 41.5 %
Single Plane A4C EF: 33.6 %
Weight: 2880 oz

## 2022-12-28 LAB — HIV ANTIBODY (ROUTINE TESTING W REFLEX): HIV Screen 4th Generation wRfx: NONREACTIVE

## 2022-12-28 LAB — GASTROINTESTINAL PANEL BY PCR, STOOL (REPLACES STOOL CULTURE)

## 2022-12-28 LAB — GLUCOSE, CAPILLARY
Glucose-Capillary: 229 mg/dL — ABNORMAL HIGH (ref 70–99)
Glucose-Capillary: 76 mg/dL (ref 70–99)
Glucose-Capillary: 348 mg/dL — ABNORMAL HIGH (ref 70–99)
Glucose-Capillary: 426 mg/dL — ABNORMAL HIGH (ref 70–99)

## 2022-12-28 LAB — MAGNESIUM: Magnesium: 1.4 mg/dL — ABNORMAL LOW (ref 1.7–2.4)

## 2022-12-28 MED ORDER — METOPROLOL TARTRATE 25 MG PO TABS
12.5000 mg | ORAL_TABLET | Freq: Two times a day (BID) | ORAL | Status: DC
  Administered 2022-12-28 – 2022-12-31 (×6): 12.5 mg via ORAL
  Filled 2022-12-28 (×6): qty 1

## 2022-12-28 MED ORDER — POTASSIUM CHLORIDE CRYS ER 20 MEQ PO TBCR
40.0000 meq | EXTENDED_RELEASE_TABLET | Freq: Once | ORAL | Status: AC
  Administered 2022-12-28: 40 meq via ORAL
  Filled 2022-12-28: qty 2

## 2022-12-28 MED ORDER — MAGNESIUM SULFATE 2 GM/50ML IV SOLN
2.0000 g | Freq: Once | INTRAVENOUS | Status: AC
  Administered 2022-12-28: 2 g via INTRAVENOUS
  Filled 2022-12-28: qty 50

## 2022-12-28 MED ORDER — POTASSIUM CHLORIDE CRYS ER 20 MEQ PO TBCR: 40.0000 meq | EXTENDED_RELEASE_TABLET | Freq: Once | ORAL | Status: DC

## 2022-12-28 NOTE — Progress Notes (Signed)
No acute events overnight. Pt's last CIWA was scored at a 0. Pt did get up several times with diarrhea. Todd Corona Edd Fabian

## 2022-12-28 NOTE — Progress Notes (Signed)
Patient's potassium was 3.1 this am, Dr Tat notified. Orders received,and given. Vital signs stable. Call bell in reach. Plan of care on going.

## 2022-12-28 NOTE — Progress Notes (Signed)
  Echocardiogram 2D Echocardiogram has been performed.  Wynelle Link 12/28/2022, 10:42 AM

## 2022-12-28 NOTE — Progress Notes (Signed)
   12/28/22 1823  Vitals  Temp 98.4 F (36.9 C)  Temp Source Oral  BP (!) 124/91  BP Location Left Arm  BP Method Automatic  Patient Position (if appropriate) Lying  Pulse Rate (!) 117  Pulse Rate Source Dinamap  Resp 20  MEWS COLOR  MEWS Score Color Yellow  Oxygen Therapy  SpO2 100 %  O2 Device Room Air  MEWS Score  MEWS Temp 0  MEWS Systolic 0  MEWS Pulse 2  MEWS RR 0  MEWS LOC 0  MEWS Score 2  Provider Notification  Provider Name/Title Dr Tat  Date Provider Notified 12/28/22  Time Provider Notified [id number]  Method of Notification Page  Notification Reason Other (Comment) (HR 117, b/p 124/91)  Provider response See new orders  Date of Provider Response 12/28/22  Time of Provider Response [id number]

## 2022-12-28 NOTE — TOC Initial Note (Signed)
Transition of Care [facility]) - Initial/Assessment Note    Patient Details  Name: Todd Rodriguez MRN: [id number] Date of Birth: [birth year]  Transition of Care [facility]) CM/SW Contact:    Boneta Lucks, RN Phone Number: 12/28/2022, 3:20 PM  Clinical Narrative: Patient admitted with anasarca. TOC consulted for Substance abuse resources. Patient is agreeable. He states he only drinks one beer a day and not everyday. He was not thinking he needed any help, but will take the resources to review and talk to MD about his disease. TOC added resources to AVS.                   Expected Discharge Plan: Home/Self Care Barriers to Discharge: Continued Medical Work up   Patient Goals and CMS Choice Patient states their goals for this hospitalization and ongoing recovery are:: to get better. CMS Medicare.gov Compare Post Acute Care list provided to:: Patient Choice offered to / list presented to : Patient Expected Discharge Plan and Services    Home    Prior Living Arrangements/Services     Patient language and need for interpreter reviewed:: Yes        Need for Family Participation in Patient Care: Yes (Comment) Care giver support system in place?: Yes (comment)   Criminal Activity/Legal Involvement Pertinent to Current Situation/Hospitalization: No - Comment as needed  Activities of Daily Living   Emotional Assessment     Affect (typically observed): Accepting Orientation: : Oriented to Self, Oriented to Place, Oriented to  Time, Oriented to Situation Alcohol / Substance Use: Not Applicable Psych Involvement: No (comment)  Admission diagnosis:  Anasarca [R60.1] Patient Active Problem List   Diagnosis Date Noted   Anasarca 12/27/2022   Heme positive stool 04/17/2022   Uncontrolled type 2 diabetes mellitus with hyperglycemia, with long-term current use of insulin ([place]) 04/17/2022   Pancytopenia ([place]) 04/16/2022   Hypokalemia 04/16/2022   DKA, type 2 ([place]) 07/01/2021   DKA (diabetic  ketoacidosis) ([place]) 06/30/2021   Hyponatremia 06/30/2021   Hypothermia 06/30/2021   COVID-19 virus infection 06/30/2021   Hyperkalemia 06/30/2021   AKI (acute kidney injury) ([place]) 06/30/2021   Nausea & vomiting 06/30/2021   Failure to thrive in adult 06/30/2021   Starvation ketoacidosis 06/30/2021   Tobacco abuse 06/30/2021   Loose stools 08/27/2020   Abnormal weight loss 04/26/2020   Rectal bleeding 04/26/2020   Pancreatic pseudocyst    Protein-calorie malnutrition, severe 05/29/2019   High anion gap metabolic acidosis 0000000   Alcohol abuse 05/28/2019   Thrombocytopenia ([place]) 05/28/2019   Pancreatic cyst 05/28/2019   Hepatic lesion 05/28/2019   Dyslipidemia 05/11/2019   Abnormal LFTs 09/12/2018   Medication management 09/12/2018   S/P cervical spinal fusion 01/12/2018   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   Carpal tunnel syndrome of left wrist 12/10/2017   Cervical radiculopathy 11/11/2017   History of fusion of cervical spine 10/22/2017   Chronic pancreatitis ([place]) 10/28/2015   Cramps, extremity 06/25/2015   Diabetes mellitus type 2, uncontrolled, without complications [id number]   Hand cramps 01/25/2015   Fever, unspecified 03/26/2014   Hx of acute pancreatitis 01/03/2014   Tobacco dependence 01/03/2014   Other and unspecified hyperlipidemia 01/03/2014   GERD (gastroesophageal reflux disease) 11/22/2013   Essential hypertension, benign 10/25/2013   Leukocytosis, unspecified 10/25/2013   PCP:  Burnis Medin, MD Pharmacy:   [id number] [address], [place] [place] [id number] Phone: [phone] Fax: [phone]  [facility] (New Address) - [place], [place] AT Previously: [address], [address] [place] [address] Phone: [phone] Fax:  [phone]     Social Determinants of Health (SDOH) Social History: SDOH Screenings   Food Insecurity: No Food Insecurity (06/15/2022)  Housing: Low Risk  (06/15/2022)  Transportation Needs: No Transportation Needs (06/15/2022)  Utilities: Not At Risk (06/15/2022)  Alcohol Screen: Low Risk  (06/15/2022)  Depression (PHQ2-9): Low Risk  (12/22/2022)  Financial Resource Strain: Low Risk  (06/15/2022)  Physical Activity: Inactive (06/15/2022)  Social Connections: Socially Integrated (06/15/2022)  Stress: No Stress Concern Present (06/15/2022)  Tobacco Use: High Risk (12/27/2022)   SDOH Interventions:     Readmission Risk Interventions    04/17/2022   10:25 AM  Readmission Risk Prevention Plan  Transportation Screening Complete  PCP or Specialist Appt within 5-7 Days Not Complete  Home Care Screening Complete  Medication Review (RN CM) Complete

## 2022-12-28 NOTE — Progress Notes (Addendum)
Patient placed on telemetry box 10, per MD orders. Call bell in reach. Plan of care on going.

## 2022-12-28 NOTE — Progress Notes (Signed)
PROGRESS NOTE  Todd Rodriguez [id number] DOB: 03-Nov-1975 DOA: 12/27/2022 PCP: Burnis Medin, MD  Brief History:  47 year old male with a history of diabetes mellitus type 2, hypertension, alcohol abuse, upper GI bleed, tobacco abuse chronic pancreatitis with pseudocyst, GERD presenting with increasing lower extremity edema and dyspnea on exertion.  The patient states that for the past 2 weeks he has noted increasing lower extremity edema progressing to his upper thighs and into his scrotum and penis.  He recently saw his PCP who increased his furosemide to 40 mg twice daily 4 days prior to this admission.  He states that has not helped.  His dyspnea on exertion has worsened during this period of time, but he denies any coughing, hemoptysis, chest pain.  He denies any fevers or chills, but states that he feels cold all the time.  He states that he has gained 20 to 30 pounds in the past 2 weeks.  He denies any frank orthopnea or PND. Unfortunately, he continues to drink alcohol.  He states that he has been drinking 2 x 12 ounce beers daily.  He stated that he quit drinking his usual shots of vodka.  He continues to smoke cigarettes.  He denies any illicit drug use.  In addition, the patient has been complaining of loose stools, up to 10 bowel movements daily.  He denies hematochezia or melena.  He denies any frank abdominal pain but has some upper abdominal discomfort.  He denies any recent travels.  He states that he has not eaten any unusual or undercooked foods.  He has had some subjective fevers and chills.  There is no dysuria or hematuria.  In the ED, the patient was afebrile hemodynamically stable with oxygen saturation 100% room air.  WBC 5.6, hemoglobin 12.0, platelets 217,000.  Sodium 133, potassium 2.8, bicarbonate 26, serum creatinine 0.67.  Chest x-ray was poor inspiration but showed bibasilar atelectasis.  The patient was given IV furosemide in the emergency department and  admitted for further evaluation and treatment.   Assessment/Plan: Anasarca -Concerned about decompensated liver cirrhosis -The patient has a long history of alcohol usage and dependence -Abdominal ultrasound--diagnostic paracentesis if enough ascites -Echo EF 55-60%, no WMA, normal RVF, trivial MR -12/22/2022 TSH 2.42 -Urine protein creatinine ratio-- too low to calculate -04/16/2022 CT abdomen and pelvis showed marked fatty liver -Continue IV furosemide 40 mg bid -Review of the medical record shows that the patient weighed 140 lbs during office visit on 10/09/2022.  His weight was 183 on his office visit 12/22/2022. -request paracentesis>>planning for 3/26 -3/25 weight 171 lb   Alcohol abuse -Alcohol withdrawal protocol   Diarrhea -Stool pathogen panel -Check C. Difficile -likely related to pancreatic insufficiency vs portal hypertensive colopathy   Uncontrolled diabetes mellitus type 2 with hypoglycemia -10/09/2022 hemoglobin A1c 9.1 -NovoLog sliding scale -Holding glargine insulin for right now and monitor CBGs   Chronic pancreatitis ([place]) 04/16/22 CT A/P with contrast -chronic calcific pancreatitis with pseudocyst at the pancreatic tail measuring 5.5 x 4.7 x 4.9 (decreased from prior).  Patient has pancreatic enzyme replacement on his med list.  His wife previously reports he has been noncompliant with this at home.    Essential hypertension, benign Holding amlodipine and benazepril to allow BP margin for diuresis   Hypokalemia/hypomagnesemia -repleted   Family Communication:  spouse updated 3/25   Consultants:  none   Code Status:  FULL   DVT Prophylaxis:  lovenox   Procedures: As Listed in Progress Note Above   Antibiotics: None        Subjective: Feeling a little better, but "stomach still tight".  Denies f/c, cp, sob, n/v/  still having some loose stool  Objective: Vitals:   12/27/22 2107 12/28/22 0256 12/28/22 0853 12/28/22 1300  BP: (!) 138/95  119/86 (!) 135/9 (!) 143/96  Pulse: 94 97 (!) 105 100  Resp: 18 16 18 20   Temp: 98 F (36.7 C) 98.1 F (36.7 C) 98.8 F (37.1 C) 98.2 F (36.8 C)  TempSrc: Oral Oral Oral Oral  SpO2: 100% 100% 100% 100%  Weight:      Height:        Intake/Output Summary (Last 24 hours) at 12/28/2022 1809 Last data filed at 12/28/2022 1516 Gross per 24 hour  Intake 604.83 ml  Output 300 ml  Net 304.83 ml   Weight change:  Exam:  General:  Pt is alert, follows commands appropriately, not in acute distress HEENT: No icterus, No thrush, No neck mass, [place]/AT Cardiovascular: RRR, S1/S2, no rubs, no gallops Respiratory: bibasilar crackles Abdomen: Soft/+BS, non tender, non distended, no guarding Extremities: 2 + LEedema, No lymphangitis, No petechiae, No rashes, no synovitis   Data Reviewed: I have personally reviewed following labs and imaging studies Basic Metabolic Panel: Recent Labs  Lab 12/22/22 1546 12/27/22 1146 12/28/22 0520  NA 134* 133* 134*  K 3.2* 2.8* 3.1*  CL 101 99 101  CO2 24 26 26   GLUCOSE 269* 95 293*  BUN 6 <5* <5*  CREATININE 0.70 0.67 0.76  CALCIUM 7.8* 7.6* 7.1*  MG  --   --  1.4*   Liver Function Tests: Recent Labs  Lab 12/22/22 1546 12/27/22 1146 12/28/22 0520  AST 23 26 23   ALT 15 18 16   ALKPHOS 208* 181* 143*  BILITOT 2.4* 2.1* 1.8*  PROT 5.9* 6.3* 5.0*  ALBUMIN 2.5* 2.4* 1.9*   Recent Labs  Lab 12/27/22 1146  LIPASE 19   No results for input(s): "AMMONIA" in the last 168 hours. Coagulation Profile: No results for input(s): "INR", "PROTIME" in the last 168 hours. CBC: Recent Labs  Lab 12/22/22 1546 12/27/22 1146 12/28/22 0520  WBC 7.2 5.6 3.9*  NEUTROABS 5.0 3.5  --   HGB 11.5* 12.0* 10.1*  HCT 35.3* 37.1* 32.2*  MCV 97.4 95.1 98.5  PLT 221.0 217 146*   Cardiac Enzymes: No results for input(s): "CKTOTAL", "CKMB", "CKMBINDEX", "TROPONINI" in the last 168 hours. BNP: Invalid input(s): "POCBNP" CBG: Recent Labs  Lab 12/27/22 1844  12/27/22 2121 12/28/22 0721 12/28/22 1226 12/28/22 1641  GLUCAP 104* 149* 229* 76 348*   HbA1C: No results for input(s): "HGBA1C" in the last 72 hours. Urine analysis:    Component Value Date/Time   COLORURINE YELLOW 12/27/2022 1146   APPEARANCEUR CLEAR 12/27/2022 1146   LABSPEC 1.005 12/27/2022 1146   PHURINE 6.0 12/27/2022 1146   GLUCOSEU NEGATIVE 12/27/2022 1146   HGBUR SMALL (A) 12/27/2022 1146   BILIRUBINUR NEGATIVE 12/27/2022 1146   BILIRUBINUR n 01/01/2015 1112   KETONESUR NEGATIVE 12/27/2022 1146   PROTEINUR NEGATIVE 12/27/2022 1146   UROBILINOGEN 0.2 01/01/2015 1112   UROBILINOGEN 0.2 10/25/2013 1541   NITRITE NEGATIVE 12/27/2022 1146   LEUKOCYTESUR NEGATIVE 12/27/2022 1146   Sepsis Labs: @LABRCNTIP (procalcitonin:4,lacticidven:4) ) Recent Results (from the past 240 hour(s))  Resp panel by RT-PCR (RSV, Flu A&B, Covid) Anterior Nasal Swab     Status: None   Collection Time: 12/27/22  2:01 PM   Specimen: Anterior  Nasal Swab  Result Value Ref Range Status   SARS Coronavirus 2 by RT PCR NEGATIVE NEGATIVE Final    Comment: (NOTE) SARS-CoV-2 target nucleic acids are NOT DETECTED.  The SARS-CoV-2 RNA is generally detectable in upper respiratory specimens during the acute phase of infection. The lowest concentration of SARS-CoV-2 viral copies this assay can detect is 138 copies/mL. A negative result does not preclude SARS-Cov-2 infection and should not be used as the sole basis for treatment or other patient management decisions. A negative result may occur with  improper specimen collection/handling, submission of specimen other than nasopharyngeal swab, presence of viral mutation(s) within the areas targeted by this assay, and inadequate number of viral copies(<138 copies/mL). A negative result must be combined with clinical observations, patient history, and epidemiological information. The expected result is Negative.  Fact Sheet for Patients:   EntrepreneurPulse.com.au  Fact Sheet for Healthcare Providers:  IncredibleEmployment.be  This test is no t yet approved or cleared by the [place] FDA and  has been authorized for detection and/or diagnosis of SARS-CoV-2 by FDA under an Emergency Use Authorization (EUA). This EUA will remain  in effect (meaning this test can be used) for the duration of the COVID-19 declaration under Section 564(b)(1) of the Act, 21 U.S.C.section 360bbb-3(b)(1), unless the authorization is terminated  or revoked sooner.       Influenza A by PCR NEGATIVE NEGATIVE Final   Influenza B by PCR NEGATIVE NEGATIVE Final    Comment: (NOTE) The Xpert Xpress SARS-CoV-2/FLU/RSV plus assay is intended as an aid in the diagnosis of influenza from Nasopharyngeal swab specimens and should not be used as a sole basis for treatment. Nasal washings and aspirates are unacceptable for Xpert Xpress SARS-CoV-2/FLU/RSV testing.  Fact Sheet for Patients: EntrepreneurPulse.com.au  Fact Sheet for Healthcare Providers: IncredibleEmployment.be  This test is not yet approved or cleared by the [place] FDA and has been authorized for detection and/or diagnosis of SARS-CoV-2 by FDA under an Emergency Use Authorization (EUA). This EUA will remain in effect (meaning this test can be used) for the duration of the COVID-19 declaration under Section 564(b)(1) of the Act, 21 U.S.C. section 360bbb-3(b)(1), unless the authorization is terminated or revoked.     Resp Syncytial Virus by PCR NEGATIVE NEGATIVE Final    Comment: (NOTE) Fact Sheet for Patients: EntrepreneurPulse.com.au  Fact Sheet for Healthcare Providers: IncredibleEmployment.be  This test is not yet approved or cleared by the [place] FDA and has been authorized for detection and/or diagnosis of SARS-CoV-2 by FDA under an Emergency Use  Authorization (EUA). This EUA will remain in effect (meaning this test can be used) for the duration of the COVID-19 declaration under Section 564(b)(1) of the Act, 21 U.S.C. section 360bbb-3(b)(1), unless the authorization is terminated or revoked.  Performed at [facility], [address] [place], [place] [address]   Gastrointestinal Panel by PCR , Stool     Status: None   Collection Time: 12/27/22  4:25 PM   Specimen: Stool  Result Value Ref Range Status   Campylobacter species NOT DETECTED NOT DETECTED Final   Plesimonas shigelloides NOT DETECTED NOT DETECTED Final   Salmonella species NOT DETECTED NOT DETECTED Final   Yersinia enterocolitica NOT DETECTED NOT DETECTED Final   Vibrio species NOT DETECTED NOT DETECTED Final   Vibrio cholerae NOT DETECTED NOT DETECTED Final   Enteroaggregative E coli (EAEC) NOT DETECTED NOT DETECTED Final   Enteropathogenic E coli (EPEC) NOT DETECTED NOT DETECTED Final   Enterotoxigenic E  coli (ETEC) NOT DETECTED NOT DETECTED Final   Shiga like toxin producing E coli (STEC) NOT DETECTED NOT DETECTED Final   Shigella/Enteroinvasive E coli (EIEC) NOT DETECTED NOT DETECTED Final   Cryptosporidium NOT DETECTED NOT DETECTED Final   Cyclospora cayetanensis NOT DETECTED NOT DETECTED Final   Entamoeba histolytica NOT DETECTED NOT DETECTED Final   Giardia lamblia NOT DETECTED NOT DETECTED Final   Adenovirus F40/41 NOT DETECTED NOT DETECTED Final   Astrovirus NOT DETECTED NOT DETECTED Final   Norovirus GI/GII NOT DETECTED NOT DETECTED Final   Rotavirus A NOT DETECTED NOT DETECTED Final   Sapovirus (I, II, IV, and V) NOT DETECTED NOT DETECTED Final    Comment: Performed at [facility], [address], [place], [place] [address]  C Difficile Quick Screen w PCR reflex     Status: None   Collection Time: 12/27/22  4:25 PM   Specimen: STOOL  Result Value Ref Range Status   C Diff antigen NEGATIVE NEGATIVE Final   C Diff toxin NEGATIVE NEGATIVE  Final   C Diff interpretation No C. difficile detected.  Final    Comment: Performed at [facility], [address], [place], [place] [address]     Scheduled Meds:  atorvastatin  40 mg Oral Daily   enoxaparin (LOVENOX) injection  40 mg Subcutaneous [id number]   folic acid  1 mg Oral Daily   furosemide  40 mg Intravenous BID   gabapentin  600 mg Oral TID   insulin aspart  0-5 Units Subcutaneous QHS   insulin aspart  0-9 Units Subcutaneous TID WC   multivitamin with minerals  1 tablet Oral Daily   pantoprazole  40 mg Oral Daily   [START ON 12/29/2022] potassium chloride  40 mEq Oral Once   thiamine  100 mg Oral Daily   Or   thiamine  100 mg Intravenous Daily   Continuous Infusions:  Procedures/Studies: ECHOCARDIOGRAM COMPLETE  Result Date: 12/28/2022    ECHOCARDIOGRAM REPORT   Patient Name:   [facility] Date of Exam: 12/28/2022 Medical Rec #:  [id number]      Height:       74.0 in Accession #:    [id number]     Weight:       180.0 lb Date of Birth:  1976-03-19      BSA:          2.078 m Patient Age:    77 years       BP:           119/86 mmHg Patient Gender: M              HR:           86 bpm. Exam Location:  [facility] Procedure: 2D Echo, Cardiac Doppler and Color Doppler Indications:    Dyspnea R06.00  History:        Patient has no prior history of Echocardiogram examinations.                 Signs/Symptoms:Dyspnea; Risk Factors:Hypertension, Current                 Smoker and Diabetes.  Sonographer:    Greer Pickerel Referring Phys: [phone] Lavan Imes  Sonographer Comments: Image acquisition challenging due to patient body habitus and Image acquisition challenging due to respiratory motion. IMPRESSIONS  1. Left ventricular ejection fraction, by estimation, is 55 to 60%. The left ventricle has normal function. The left ventricle has no regional wall motion abnormalities.  Left ventricular diastolic parameters are indeterminate.  2. Right ventricular systolic function is normal. The right ventricular  size is normal. There is normal pulmonary artery systolic pressure. The estimated right ventricular systolic pressure is Q000111Q mmHg.  3. The mitral valve is grossly normal. Trivial mitral valve regurgitation.  4. The aortic valve is tricuspid. Aortic valve regurgitation is not visualized.  5. The inferior vena cava is normal in size with <50% respiratory variability, suggesting right atrial pressure of 8 mmHg. Comparison(s): No prior Echocardiogram. FINDINGS  Left Ventricle: Left ventricular ejection fraction, by estimation, is 55 to 60%. The left ventricle has normal function. The left ventricle has no regional wall motion abnormalities. The left ventricular internal cavity size was normal in size. There is  no left ventricular hypertrophy. Left ventricular diastolic parameters are indeterminate. Right Ventricle: The right ventricular size is normal. No increase in right ventricular wall thickness. Right ventricular systolic function is normal. There is normal pulmonary artery systolic pressure. The tricuspid regurgitant velocity is 2.16 m/s, and  with an assumed right atrial pressure of 8 mmHg, the estimated right ventricular systolic pressure is Q000111Q mmHg. Left Atrium: Left atrial size was normal in size. Right Atrium: Right atrial size was normal in size. Pericardium: There is no evidence of pericardial effusion. Mitral Valve: The mitral valve is grossly normal. Trivial mitral valve regurgitation. Tricuspid Valve: The tricuspid valve is grossly normal. Tricuspid valve regurgitation is trivial. Aortic Valve: The aortic valve is tricuspid. There is mild aortic valve annular calcification. Aortic valve regurgitation is not visualized. Pulmonic Valve: The pulmonic valve was grossly normal. Pulmonic valve regurgitation is trivial. Aorta: The aortic root is normal in size and structure. Venous: The inferior vena cava is normal in size with less than 50% respiratory variability, suggesting right atrial pressure of 8  mmHg. IAS/Shunts: No atrial level shunt detected by color flow Doppler.  LEFT VENTRICLE PLAX 2D LVIDd:         3.90 cm     Diastology LVIDs:         3.10 cm     LV e' medial:    10.30 cm/s LV PW:         1.00 cm     LV E/e' medial:  3.9 LV IVS:        0.70 cm     LV e' lateral:   11.30 cm/s LVOT diam:     1.80 cm     LV E/e' lateral: 3.6 LV SV:         26 LV SV Index:   13 LVOT Area:     2.54 cm  LV Volumes (MOD) LV vol d, MOD A2C: 59.1 ml LV vol d, MOD A4C: 48.2 ml LV vol s, MOD A2C: 34.6 ml LV vol s, MOD A4C: 32.0 ml LV SV MOD A2C:     24.5 ml LV SV MOD A4C:     48.2 ml LV SV MOD BP:      20.6 ml RIGHT VENTRICLE RV S prime:     11.40 cm/s TAPSE (M-mode): 1.6 cm LEFT ATRIUM             Index        RIGHT ATRIUM          Index LA diam:        3.50 cm 1.68 cm/m   RA Area:     6.37 cm LA Vol (A2C):   46.9 ml 22.57 ml/m  RA Volume:   9.20 ml  4.43 ml/m LA Vol (A4C):   29.3 ml 14.10 ml/m LA Biplane Vol: 36.9 ml 17.76 ml/m  AORTIC VALVE LVOT Vmax:   65.60 cm/s LVOT Vmean:  43.200 cm/s LVOT VTI:    0.104 m  AORTA Ao Root diam: 3.50 cm MITRAL VALVE               TRICUSPID VALVE MV Area (PHT): 6.83 cm    TR Peak grad:   18.7 mmHg MV Decel Time: 111 msec    TR Vmax:        216.00 cm/s MV E velocity: 40.50 cm/s MV A velocity: 79.90 cm/s  SHUNTS MV E/A ratio:  0.51        Systemic VTI:  0.10 m                            Systemic Diam: 1.80 cm Rozann Lesches MD Electronically signed by Rozann Lesches MD Signature Date/Time: 12/28/2022/2:02:39 PM    Final    US Venous Img Lower Bilateral (DVT)  Result Date: 12/28/2022 CLINICAL DATA:  [id number] Leg edema [id number] EXAM: BILATERAL LOWER EXTREMITY VENOUS DOPPLER ULTRASOUND TECHNIQUE: Gray-scale sonography with compression, as well as color and duplex ultrasound, were performed to evaluate the deep venous system(s) from the level of the common femoral vein through the popliteal and proximal calf veins. COMPARISON:  None Available. FINDINGS: VENOUS Normal compressibility of  the common femoral, superficial femoral, and popliteal veins, as well as the visualized calf veins. Visualized portions of profunda femoral vein and great saphenous vein unremarkable. No filling defects to suggest DVT on grayscale or color Doppler imaging. Doppler waveforms show normal direction of venous flow, normal respiratory plasticity and response to augmentation. OTHER Extensive bilateral lower extremity subcutaneous edema. Limitations: none IMPRESSION: Negative. Electronically Signed   By: Lucrezia Europe M.D.   On: 12/28/2022 11:46   [place] ASCITES (ABDOMEN LIMITED)  Result Date: 12/28/2022 CLINICAL DATA:  Pancreatitis.  Ascites? EXAM: LIMITED ABDOMEN ULTRASOUND FOR ASCITES TECHNIQUE: Limited ultrasound survey for ascites was performed in all four abdominal quadrants. COMPARISON:  None Available. FINDINGS: Moderate amount of ascites is present within all 4 quadrants of the abdomen. IMPRESSION: Moderate amount of ascites is present within all 4 quadrants of the abdomen. Electronically Signed   By: Franki Cabot M.D.   On: 12/28/2022 11:41   CT Angio Chest Pulmonary Embolism (PE) W or WO Contrast  Result Date: 12/27/2022 CLINICAL DATA:  Pulmonary embolus suspected with low to intermediate probability. Positive D-dimer. EXAM: CT ANGIOGRAPHY CHEST WITH CONTRAST TECHNIQUE: Multidetector CT imaging of the chest was performed using the standard protocol during bolus administration of intravenous contrast. Multiplanar CT image reconstructions and MIPs were obtained to evaluate the vascular anatomy. RADIATION DOSE REDUCTION: This exam was performed according to the departmental dose-optimization program which includes automated exposure control, adjustment of the mA and/or kV according to patient size and/or use of iterative reconstruction technique. CONTRAST:  42mL OMNIPAQUE IOHEXOL 350 MG/ML SOLN COMPARISON:  Chest radiograph 324 24.  CT 05/28/2019 FINDINGS: Cardiovascular: There is good opacification of the  central and segmental pulmonary arteries. No focal filling defects. No evidence of significant pulmonary embolus. Normal heart size. No pericardial effusions. Normal caliber thoracic aorta. No aortic dissection. Scattered calcification in the aorta and coronary arteries. Mediastinum/Nodes: Thyroid gland is unremarkable. Esophagus is decompressed. No significant lymphadenopathy. Calcified lymph nodes are present consistent with postinflammatory changes. Lungs/Pleura: Moderate bilateral pleural effusions. Bilateral basilar atelectasis. No pneumothorax. Upper  Abdomen: Prominent upper abdominal ascites. Cirrhotic changes in the liver. Spleen is enlarged. Cystic lesion involving the tail of the pancreas measuring 4.9 cm diameter, incompletely included within the field of view but unchanged since prior study. 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 Musculoskeletal: Postoperative changes in the cervical spine. No acute bony abnormalities. Review of the MIP images confirms the above findings. IMPRESSION: 1. No evidence of significant pulmonary embolus. 2. Moderate bilateral pleural effusions with basilar atelectasis. 3. Severe upper abdominal ascites. 4. Stable appearance of pancreatic cyst, likely pseudocyst. 5. Likely hepatic cirrhosis. Electronically Signed   By: Lucienne Capers M.D.   On: 12/27/2022 23:32   DG Chest Port 1 View  Result Date: 12/27/2022 CLINICAL DATA:  Shortness of breath. EXAM: PORTABLE CHEST 1 VIEW COMPARISON:  April 16, 2022. FINDINGS: The heart size and mediastinal contours are within normal limits. Hypoinflation of the lungs with minimal bibasilar subsegmental atelectasis. The visualized skeletal structures are unremarkable. IMPRESSION: Hypoinflation of the lungs with minimal bibasilar subsegmental atelectasis. Electronically Signed   By: Marijo Conception M.D.   On: 12/27/2022 12:30    Orson Eva, DO  [facility]  If 7PM-7AM, please contact night-coverage [email or web address] Password [email or web address] 12/28/2022,  6:09 PM   LOS: 1 day

## 2022-12-29 ENCOUNTER — Inpatient Hospital Stay (HOSPITAL_COMMUNITY): Payer: Medicare Other

## 2022-12-29 ENCOUNTER — Encounter (HOSPITAL_COMMUNITY): Payer: Self-pay | Admitting: Internal Medicine

## 2022-12-29 DIAGNOSIS — R601 Generalized edema: Secondary | ICD-10-CM | POA: Diagnosis not present

## 2022-12-29 DIAGNOSIS — E1165 Type 2 diabetes mellitus with hyperglycemia: Secondary | ICD-10-CM | POA: Diagnosis not present

## 2022-12-29 DIAGNOSIS — K863 Pseudocyst of pancreas: Secondary | ICD-10-CM | POA: Diagnosis not present

## 2022-12-29 DIAGNOSIS — F17200 Nicotine dependence, unspecified, uncomplicated: Secondary | ICD-10-CM | POA: Diagnosis not present

## 2022-12-29 LAB — GLUCOSE, CAPILLARY
Glucose-Capillary: 339 mg/dL — ABNORMAL HIGH (ref 70–99)
Glucose-Capillary: 130 mg/dL — ABNORMAL HIGH (ref 70–99)
Glucose-Capillary: 170 mg/dL — ABNORMAL HIGH (ref 70–99)
Glucose-Capillary: 304 mg/dL — ABNORMAL HIGH (ref 70–99)

## 2022-12-29 LAB — CBC
HCT: 32.6 % — ABNORMAL LOW (ref 39.0–52.0)
Hemoglobin: 10.3 g/dL — ABNORMAL LOW (ref 13.0–17.0)
MCH: 31 pg (ref 26.0–34.0)
MCHC: 31.6 g/dL (ref 30.0–36.0)
MCV: 98.2 fL (ref 80.0–100.0)
Platelets: 162 10*3/uL (ref 150–400)
RBC: 3.32 MIL/uL — ABNORMAL LOW (ref 4.22–5.81)
RDW: 14.4 % (ref 11.5–15.5)
WBC: 5.4 10*3/uL (ref 4.0–10.5)
nRBC: 0 % (ref 0.0–0.2)

## 2022-12-29 LAB — HEMOGLOBIN A1C
Hgb A1c MFr Bld: 7.2 % — ABNORMAL HIGH (ref 4.8–5.6)
Mean Plasma Glucose: 160 mg/dL

## 2022-12-29 LAB — HEPATIC FUNCTION PANEL
ALT: 18 U/L (ref 0–44)
AST: 22 U/L (ref 15–41)
Albumin: 2 g/dL — ABNORMAL LOW (ref 3.5–5.0)
Alkaline Phosphatase: 156 U/L — ABNORMAL HIGH (ref 38–126)
Bilirubin, Direct: 0.9 mg/dL — ABNORMAL HIGH (ref 0.0–0.2)
Indirect Bilirubin: 0.9 mg/dL (ref 0.3–0.9)
Total Bilirubin: 1.8 mg/dL — ABNORMAL HIGH (ref 0.3–1.2)
Total Protein: 5.4 g/dL — ABNORMAL LOW (ref 6.5–8.1)

## 2022-12-29 LAB — BODY FLUID CELL COUNT WITH DIFFERENTIAL
Eos, Fluid: 0 %
Lymphs, Fluid: 43 %
Monocyte-Macrophage-Serous Fluid: 57 % (ref 50–90)
Neutrophil Count, Fluid: 0 % (ref 0–25)
Total Nucleated Cell Count, Fluid: 47 cu mm (ref 0–1000)

## 2022-12-29 LAB — MAGNESIUM: Magnesium: 1.7 mg/dL (ref 1.7–2.4)

## 2022-12-29 LAB — GRAM STAIN: Gram Stain: NONE SEEN

## 2022-12-29 LAB — BASIC METABOLIC PANEL
Anion gap: 6 (ref 5–15)
BUN: 5 mg/dL — ABNORMAL LOW (ref 6–20)
CO2: 26 mmol/L (ref 22–32)
Calcium: 7.1 mg/dL — ABNORMAL LOW (ref 8.9–10.3)
Chloride: 101 mmol/L (ref 98–111)
Creatinine, Ser: 0.78 mg/dL (ref 0.61–1.24)
GFR, Estimated: >60 mL/min (ref >60)
Glucose, Bld: 292 mg/dL — ABNORMAL HIGH (ref 70–99)
Potassium: 2.7 mmol/L — CL (ref 3.5–5.1)
Sodium: 133 mmol/L — ABNORMAL LOW (ref 135–145)

## 2022-12-29 LAB — ALBUMIN, PLEURAL OR PERITONEAL FLUID: Albumin, Fluid: <1.5 g/dL

## 2022-12-29 MED ORDER — MAGNESIUM SULFATE 2 GM/50ML IV SOLN
2.0000 g | Freq: Once | INTRAVENOUS | Status: AC
  Administered 2022-12-29: 2 g via INTRAVENOUS
  Filled 2022-12-29: qty 50

## 2022-12-29 MED ORDER — INSULIN GLARGINE-YFGN 100 UNIT/ML ~~LOC~~ SOLN
10.0000 [IU] | Freq: Every day | SUBCUTANEOUS | Status: DC
  Administered 2022-12-29: 10 [IU] via SUBCUTANEOUS
  Filled 2022-12-29 (×3): qty 0.1

## 2022-12-29 MED ORDER — INSULIN ASPART 100 UNIT/ML IJ SOLN
0.0000 [IU] | Freq: Every day | INTRAMUSCULAR | Status: DC
  Administered 2022-12-29: 4 [IU] via SUBCUTANEOUS

## 2022-12-29 MED ORDER — POTASSIUM CHLORIDE CRYS ER 20 MEQ PO TBCR
40.0000 meq | EXTENDED_RELEASE_TABLET | Freq: Once | ORAL | Status: AC
  Administered 2022-12-29: 40 meq via ORAL
  Filled 2022-12-29: qty 2

## 2022-12-29 MED ORDER — POTASSIUM CHLORIDE CRYS ER 20 MEQ PO TBCR
40.0000 meq | EXTENDED_RELEASE_TABLET | Freq: Two times a day (BID) | ORAL | Status: DC
  Administered 2022-12-29 – 2022-12-31 (×4): 40 meq via ORAL
  Filled 2022-12-29 (×4): qty 2

## 2022-12-29 MED ORDER — INSULIN ASPART 100 UNIT/ML IJ SOLN
0.0000 [IU] | Freq: Three times a day (TID) | INTRAMUSCULAR | Status: DC
  Administered 2022-12-29: 3 [IU] via SUBCUTANEOUS
  Administered 2022-12-29: 2 [IU] via SUBCUTANEOUS
  Administered 2022-12-30: 5 [IU] via SUBCUTANEOUS
  Administered 2022-12-30: 3 [IU] via SUBCUTANEOUS
  Administered 2022-12-30: 15 [IU] via SUBCUTANEOUS
  Administered 2022-12-31 (×2): 8 [IU] via SUBCUTANEOUS

## 2022-12-29 MED ORDER — INSULIN ASPART 100 UNIT/ML IJ SOLN
3.0000 [IU] | Freq: Three times a day (TID) | INTRAMUSCULAR | Status: DC
  Administered 2022-12-29 – 2022-12-30 (×3): 3 [IU] via SUBCUTANEOUS

## 2022-12-29 NOTE — Progress Notes (Signed)
Paracentesis complete 4L Ascites removed, no signs of distress.

## 2022-12-29 NOTE — Progress Notes (Signed)
PROGRESS NOTE  Todd Rodriguez [id number] DOB: [birth year] DOA: 12/27/2022 PCP: Burnis Medin, MD  Brief History:  47 year old male with a history of diabetes mellitus type 2, hypertension, alcohol abuse, upper GI bleed, tobacco abuse chronic pancreatitis with pseudocyst, GERD presenting with increasing lower extremity edema and dyspnea on exertion.  The patient states that for the past 2 weeks he has noted increasing lower extremity edema progressing to his upper thighs and into his scrotum and penis.  He recently saw his PCP who increased his furosemide to 40 mg twice daily 4 days prior to this admission.  He states that has not helped.  His dyspnea on exertion has worsened during this period of time, but he denies any coughing, hemoptysis, chest pain.  He denies any fevers or chills, but states that he feels cold all the time.  He states that he has gained 20 to 30 pounds in the past 2 weeks.  He denies any frank orthopnea or PND. Unfortunately, he continues to drink alcohol.  He states that he has been drinking 2 x 12 ounce beers daily.  He stated that he quit drinking his usual shots of vodka.  He continues to smoke cigarettes.  He denies any illicit drug use.  In addition, the patient has been complaining of loose stools, up to 10 bowel movements daily.  He denies hematochezia or melena.  He denies any frank abdominal pain but has some upper abdominal discomfort.  He denies any recent travels.  He states that he has not eaten any unusual or undercooked foods.  He has had some subjective fevers and chills.  There is no dysuria or hematuria.  In the ED, the patient was afebrile hemodynamically stable with oxygen saturation 100% room air.  WBC 5.6, hemoglobin 12.0, platelets 217,000.  Sodium 133, potassium 2.8, bicarbonate 26, serum creatinine 0.67.  Chest x-ray was poor inspiration but showed bibasilar atelectasis.  The patient was given IV furosemide in the emergency department and  admitted for further evaluation and treatment.   Assessment/Plan: Anasarca -Concerned about decompensated liver cirrhosis (although did not have formal diagnosis) -The patient has a long history of alcohol usage and dependence -Abdominal ultrasound--diagnostic paracentesis if enough ascites -Echo EF 55-60%, no WMA, normal RVF, trivial MR -12/22/2022 TSH 2.42 -Urine protein creatinine ratio-- too low to calculate -04/16/2022 CT abdomen and pelvis showed marked fatty liver -Continue IV furosemide 40 mg bid -Review of the medical record shows that the patient weighed 140 lbs during office visit on 10/09/2022.  His weight was 183 on his office visit 12/22/2022. -3/26 paracentesis>>4L;  hard to calculate SAAG with ascites albumin "<1.5" -3/25 weight 171 lb -3/26 weight 169 lb   Alcohol abuse -Alcohol withdrawal protocol   Diarrhea -Stool pathogen panel-- no diarrhea to collect -Check C. Difficile--no diarrhea to collect -likely related to pancreatic insufficiency vs portal hypertensive colopathy   Uncontrolled diabetes mellitus type 2 with hypoglycemia -10/09/2022 hemoglobin A1c 9.1 -NovoLog sliding scale -Holding glargine insulin for right now and monitor CBGs   Chronic pancreatitis ([name]) 04/16/22 CT A/P with contrast -chronic calcific pancreatitis with pseudocyst at the pancreatic tail measuring 5.5 x 4.7 x 4.9 (decreased from prior).  Patient has pancreatic enzyme replacement on his med list.  His wife previously reports he has been noncompliant with this at home.    Essential hypertension, benign Holding amlodipine and benazepril to allow BP margin for diuresis   Hypokalemia/hypomagnesemia -repleted  Family Communication:  spouse updated 3/26   Consultants:  none   Code Status:  FULL   DVT Prophylaxis:  lovenox [place]   Procedures: As Listed in Progress Note Above   Antibiotics: None            Subjective: Patient denies fevers, chills, headache, chest pain,  dyspnea, nausea, vomiting, diarrhea, abdominal pain, dysuria, hematuria, hematochezia, and melena.   Objective: Vitals:   12/29/22 0834 12/29/22 1040 12/29/22 1131 12/29/22 1408  BP: (!) 132/97 115/79 106/78 116/75  Pulse: (!) 101 92 84 (!) 101  Resp:  20 20 17   Temp: 98.2 F (36.8 C) 98.3 F (36.8 C)  98.8 F (37.1 C)  TempSrc: Oral Oral  Oral  SpO2: 100% 100% 100% 100%  Weight:      Height:        Intake/Output Summary (Last 24 hours) at 12/29/2022 1745 Last data filed at 12/29/2022 [id number] Gross per 24 hour  Intake 405.24 ml  Output --  Net 405.24 ml   Weight change:  Exam:  General:  Pt is alert, follows commands appropriately, not in acute distress HEENT: No icterus, No thrush, No neck mass, [place]/AT Cardiovascular: RRR, S1/S2, no rubs, no gallops Respiratory: bibasilar crackles no wheeze Abdomen: Soft/+BS, non tender, non distended, no guarding Extremities: 2 + LE edema, No lymphangitis, No petechiae, No rashes, no synovitis   Data Reviewed: I have personally reviewed following labs and imaging studies Basic Metabolic Panel: Recent Labs  Lab 12/27/22 1146 12/28/22 0520 12/29/22 0424  NA 133* 134* 133*  K 2.8* 3.1* 2.7*  CL 99 101 101  CO2 26 26 26   GLUCOSE 95 293* 292*  BUN <5* <5* 5*  CREATININE 0.67 0.76 0.78  CALCIUM 7.6* 7.1* 7.1*  MG  --  1.4* 1.7   Liver Function Tests: Recent Labs  Lab 12/27/22 1146 12/28/22 0520  AST 26 23  ALT 18 16  ALKPHOS 181* 143*  BILITOT 2.1* 1.8*  PROT 6.3* 5.0*  ALBUMIN 2.4* 1.9*   Recent Labs  Lab 12/27/22 1146  LIPASE 19   No results for input(s): "AMMONIA" in the last 168 hours. Coagulation Profile: No results for input(s): "INR", "PROTIME" in the last 168 hours. CBC: Recent Labs  Lab 12/27/22 1146 12/28/22 0520 12/29/22 0424  WBC 5.6 3.9* 5.4  NEUTROABS 3.5  --   --   HGB 12.0* 10.1* 10.3*  HCT 37.1* 32.2* 32.6*  MCV 95.1 98.5 98.2  PLT 217 146* 162   Cardiac Enzymes: No results for input(s):  "CKTOTAL", "CKMB", "CKMBINDEX", "TROPONINI" in the last 168 hours. BNP: Invalid input(s): "POCBNP" CBG: Recent Labs  Lab 12/28/22 1641 12/28/22 2116 12/29/22 0739 12/29/22 1144 12/29/22 1624  GLUCAP 348* 426* 339* 130* 170*   HbA1C: Recent Labs    12/27/22 1736  HGBA1C 7.2*   Urine analysis:    Component Value Date/Time   COLORURINE YELLOW 12/27/2022 1146   APPEARANCEUR CLEAR 12/27/2022 1146   LABSPEC 1.005 12/27/2022 1146   PHURINE 6.0 12/27/2022 1146   GLUCOSEU NEGATIVE 12/27/2022 1146   HGBUR SMALL (A) 12/27/2022 1146   BILIRUBINUR NEGATIVE 12/27/2022 1146   BILIRUBINUR n 01/01/2015 1112   KETONESUR NEGATIVE 12/27/2022 1146   PROTEINUR NEGATIVE 12/27/2022 1146   UROBILINOGEN 0.2 01/01/2015 1112   UROBILINOGEN 0.2 10/25/2013 1541   NITRITE NEGATIVE 12/27/2022 1146   LEUKOCYTESUR NEGATIVE 12/27/2022 1146   Sepsis Labs: @LABRCNTIP (procalcitonin:4,lacticidven:4) ) Recent Results (from the past 240 hour(s))  Resp panel by RT-PCR (RSV, Flu A&B, Covid)  Anterior Nasal Swab     Status: None   Collection Time: 12/27/22  2:01 PM   Specimen: Anterior Nasal Swab  Result Value Ref Range Status   SARS Coronavirus 2 by RT PCR NEGATIVE NEGATIVE Final    Comment: (NOTE) SARS-CoV-2 target nucleic acids are NOT DETECTED.  The SARS-CoV-2 RNA is generally detectable in upper respiratory specimens during the acute phase of infection. The lowest concentration of SARS-CoV-2 viral copies this assay can detect is 138 copies/mL. A negative result does not preclude SARS-Cov-2 infection and should not be used as the sole basis for treatment or other patient management decisions. A negative result may occur with  improper specimen collection/handling, submission of specimen other than nasopharyngeal swab, presence of viral mutation(s) within the areas targeted by this assay, and inadequate number of viral copies(<138 copies/mL). A negative result must be combined with clinical  observations, patient history, and epidemiological information. The expected result is Negative.  Fact Sheet for Patients:  EntrepreneurPulse.com.au  Fact Sheet for Healthcare Providers:  IncredibleEmployment.be  This test is no t yet approved or cleared by the [place] FDA and  has been authorized for detection and/or diagnosis of SARS-CoV-2 by FDA under an Emergency Use Authorization (EUA). This EUA will remain  in effect (meaning this test can be used) for the duration of the COVID-19 declaration under Section 564(b)(1) of the Act, 21 U.S.C.section 360bbb-3(b)(1), unless the authorization is terminated  or revoked sooner.       Influenza A by PCR NEGATIVE NEGATIVE Final   Influenza B by PCR NEGATIVE NEGATIVE Final    Comment: (NOTE) The Xpert Xpress SARS-CoV-2/FLU/RSV plus assay is intended as an aid in the diagnosis of influenza from Nasopharyngeal swab specimens and should not be used as a sole basis for treatment. Nasal washings and aspirates are unacceptable for Xpert Xpress SARS-CoV-2/FLU/RSV testing.  Fact Sheet for Patients: EntrepreneurPulse.com.au  Fact Sheet for Healthcare Providers: IncredibleEmployment.be  This test is not yet approved or cleared by the [place] FDA and has been authorized for detection and/or diagnosis of SARS-CoV-2 by FDA under an Emergency Use Authorization (EUA). This EUA will remain in effect (meaning this test can be used) for the duration of the COVID-19 declaration under Section 564(b)(1) of the Act, 21 U.S.C. section 360bbb-3(b)(1), unless the authorization is terminated or revoked.     Resp Syncytial Virus by PCR NEGATIVE NEGATIVE Final    Comment: (NOTE) Fact Sheet for Patients: EntrepreneurPulse.com.au  Fact Sheet for Healthcare Providers: IncredibleEmployment.be  This test is not yet approved or cleared by  the [place] FDA and has been authorized for detection and/or diagnosis of SARS-CoV-2 by FDA under an Emergency Use Authorization (EUA). This EUA will remain in effect (meaning this test can be used) for the duration of the COVID-19 declaration under Section 564(b)(1) of the Act, 21 U.S.C. section 360bbb-3(b)(1), unless the authorization is terminated or revoked.  Performed at [facility], [address] [place], [place] [address]   Gastrointestinal Panel by PCR , Stool     Status: None   Collection Time: 12/27/22  4:25 PM   Specimen: Stool  Result Value Ref Range Status   Campylobacter species NOT DETECTED NOT DETECTED Final   Plesimonas shigelloides NOT DETECTED NOT DETECTED Final   Salmonella species NOT DETECTED NOT DETECTED Final   Yersinia enterocolitica NOT DETECTED NOT DETECTED Final   Vibrio species NOT DETECTED NOT DETECTED Final   Vibrio cholerae NOT DETECTED NOT DETECTED Final   Enteroaggregative E coli (  EAEC) NOT DETECTED NOT DETECTED Final   Enteropathogenic E coli (EPEC) NOT DETECTED NOT DETECTED Final   Enterotoxigenic E coli (ETEC) NOT DETECTED NOT DETECTED Final   Shiga like toxin producing E coli (STEC) NOT DETECTED NOT DETECTED Final   Shigella/Enteroinvasive E coli (EIEC) NOT DETECTED NOT DETECTED Final   Cryptosporidium NOT DETECTED NOT DETECTED Final   Cyclospora cayetanensis NOT DETECTED NOT DETECTED Final   Entamoeba histolytica NOT DETECTED NOT DETECTED Final   Giardia lamblia NOT DETECTED NOT DETECTED Final   Adenovirus F40/41 NOT DETECTED NOT DETECTED Final   Astrovirus NOT DETECTED NOT DETECTED Final   Norovirus GI/GII NOT DETECTED NOT DETECTED Final   Rotavirus A NOT DETECTED NOT DETECTED Final   Sapovirus (I, II, IV, and V) NOT DETECTED NOT DETECTED Final    Comment: Performed at [facility], [address] [place], [place] [address]  C Difficile Quick Screen w PCR reflex     Status: None   Collection Time: 12/27/22  4:25 PM    Specimen: STOOL  Result Value Ref Range Status   C Diff antigen NEGATIVE NEGATIVE Final   C Diff toxin NEGATIVE NEGATIVE Final   C Diff interpretation No C. difficile detected.  Final    Comment: Performed at [facility], [address], [place], [place] [address]  Gram stain     Status: None   Collection Time: 12/29/22 11:15 AM   Specimen: Ascitic  Result Value Ref Range Status   Specimen Description ASCITIC  Final   Special Requests NONE  Final   Gram Stain   Final    NO ORGANISMS SEEN WBC PRESENT, PREDOMINANTLY MONONUCLEAR CYTOSPIN SMEAR Performed at [facility], [address], [place], [place] [address]    Report Status 12/29/2022 FINAL  Final  Culture, body fluid w Gram Stain-bottle     Status: None (Preliminary result)   Collection Time: 12/29/22 11:15 AM   Specimen: Ascitic  Result Value Ref Range Status   Specimen Description ASCITIC  Final   Special Requests   Final    BOTTLES DRAWN AEROBIC AND ANAEROBIC Blood Culture adequate volume Performed at [facility], [address], [place], [place] [address]    Culture PENDING  Incomplete   Report Status PENDING  Incomplete     Scheduled Meds:  atorvastatin  40 mg Oral Daily   enoxaparin (LOVENOX) injection  40 mg Subcutaneous [id number]   folic acid  1 mg Oral Daily   furosemide  40 mg Intravenous BID   gabapentin  600 mg Oral TID   insulin aspart  0-15 Units Subcutaneous TID WC   insulin aspart  0-5 Units Subcutaneous QHS   insulin aspart  3 Units Subcutaneous TID WC   insulin glargine-yfgn  10 Units Subcutaneous Daily   metoprolol tartrate  12.5 mg Oral BID   multivitamin with minerals  1 tablet Oral Daily   pantoprazole  40 mg Oral Daily   potassium chloride  40 mEq Oral BID   thiamine  100 mg Oral Daily   Or   thiamine  100 mg Intravenous Daily   Continuous Infusions:  Procedures/Studies: US Paracentesis  Result Date: 12/29/2022 INDICATION: Ascites EXAM: ULTRASOUND GUIDED RIGHT LOWER QUADRANT PARACENTESIS  MEDICATIONS: None. COMPLICATIONS: None immediate. PROCEDURE: Informed written consent was obtained from the patient after a discussion of the risks, benefits and alternatives to treatment. A timeout was performed prior to the initiation of the procedure. Initial ultrasound scanning demonstrates a large amount of ascites within the right  lower abdominal quadrant. The right lower abdomen was prepped and draped in the usual sterile fashion. 1% lidocaine was used for local anesthesia. Following this, a 8 Fr Safe-T-Centesis catheter was introduced. An ultrasound image was saved for documentation purposes. The paracentesis was performed. The catheter was removed and a dressing was applied. The patient tolerated the procedure well without immediate post procedural complication. Patient received post-procedure intravenous albumin; see nursing notes for details. FINDINGS: A total of approximately 4 L of straw-colored fluid was removed. Samples were sent to the laboratory as requested by the clinical team. IMPRESSION: Successful ultrasound-guided paracentesis yielding 4 liters of peritoneal fluid. Electronically Signed   By: Kerby Moors M.D.   On: 12/29/2022 15:16   ECHOCARDIOGRAM COMPLETE  Result Date: 12/28/2022    ECHOCARDIOGRAM REPORT   Patient Name:   [facility] Date of Exam: 12/28/2022 Medical Rec #:  [id number]      Height:       74.0 in Accession #:    [id number]     Weight:       180.0 lb Date of Birth:  09-13-1976      BSA:          2.078 m Patient Age:    66 years       BP:           119/86 mmHg Patient Gender: M              HR:           86 bpm. Exam Location:  [facility] Procedure: 2D Echo, Cardiac Doppler and Color Doppler Indications:    Dyspnea R06.00  History:        Patient has no prior history of Echocardiogram examinations.                 Signs/Symptoms:Dyspnea; Risk Factors:Hypertension, Current                 Smoker and Diabetes.  Sonographer:    Greer Pickerel Referring Phys: [phone] Morrissa Shein   Sonographer Comments: Image acquisition challenging due to patient body habitus and Image acquisition challenging due to respiratory motion. IMPRESSIONS  1. Left ventricular ejection fraction, by estimation, is 55 to 60%. The left ventricle has normal function. The left ventricle has no regional wall motion abnormalities. Left ventricular diastolic parameters are indeterminate.  2. Right ventricular systolic function is normal. The right ventricular size is normal. There is normal pulmonary artery systolic pressure. The estimated right ventricular systolic pressure is Q000111Q mmHg.  3. The mitral valve is grossly normal. Trivial mitral valve regurgitation.  4. The aortic valve is tricuspid. Aortic valve regurgitation is not visualized.  5. The inferior vena cava is normal in size with <50% respiratory variability, suggesting right atrial pressure of 8 mmHg. Comparison(s): No prior Echocardiogram. FINDINGS  Left Ventricle: Left ventricular ejection fraction, by estimation, is 55 to 60%. The left ventricle has normal function. The left ventricle has no regional wall motion abnormalities. The left ventricular internal cavity size was normal in size. There is  no left ventricular hypertrophy. Left ventricular diastolic parameters are indeterminate. Right Ventricle: The right ventricular size is normal. No increase in right ventricular wall thickness. Right ventricular systolic function is normal. There is normal pulmonary artery systolic pressure. The tricuspid regurgitant velocity is 2.16 m/s, and  with an assumed right atrial pressure of 8 mmHg, the estimated right ventricular systolic pressure is Q000111Q mmHg. Left Atrium: Left atrial size was normal in size. Right  Atrium: Right atrial size was normal in size. Pericardium: There is no evidence of pericardial effusion. Mitral Valve: The mitral valve is grossly normal. Trivial mitral valve regurgitation. Tricuspid Valve: The tricuspid valve is grossly normal. Tricuspid valve  regurgitation is trivial. Aortic Valve: The aortic valve is tricuspid. There is mild aortic valve annular calcification. Aortic valve regurgitation is not visualized. Pulmonic Valve: The pulmonic valve was grossly normal. Pulmonic valve regurgitation is trivial. Aorta: The aortic root is normal in size and structure. Venous: The inferior vena cava is normal in size with less than 50% respiratory variability, suggesting right atrial pressure of 8 mmHg. IAS/Shunts: No atrial level shunt detected by color flow Doppler.  LEFT VENTRICLE PLAX 2D LVIDd:         3.90 cm     Diastology LVIDs:         3.10 cm     LV e' medial:    10.30 cm/s LV PW:         1.00 cm     LV E/e' medial:  3.9 LV IVS:        0.70 cm     LV e' lateral:   11.30 cm/s LVOT diam:     1.80 cm     LV E/e' lateral: 3.6 LV SV:         26 LV SV Index:   13 LVOT Area:     2.54 cm  LV Volumes (MOD) LV vol d, MOD A2C: 59.1 ml LV vol d, MOD A4C: 48.2 ml LV vol s, MOD A2C: 34.6 ml LV vol s, MOD A4C: 32.0 ml LV SV MOD A2C:     24.5 ml LV SV MOD A4C:     48.2 ml LV SV MOD BP:      20.6 ml RIGHT VENTRICLE RV S prime:     11.40 cm/s TAPSE (M-mode): 1.6 cm LEFT ATRIUM             Index        RIGHT ATRIUM          Index LA diam:        3.50 cm 1.68 cm/m   RA Area:     6.37 cm LA Vol (A2C):   46.9 ml 22.57 ml/m  RA Volume:   9.20 ml  4.43 ml/m LA Vol (A4C):   29.3 ml 14.10 ml/m LA Biplane Vol: 36.9 ml 17.76 ml/m  AORTIC VALVE LVOT Vmax:   65.60 cm/s LVOT Vmean:  43.200 cm/s LVOT VTI:    0.104 m  AORTA Ao Root diam: 3.50 cm MITRAL VALVE               TRICUSPID VALVE MV Area (PHT): 6.83 cm    TR Peak grad:   18.7 mmHg MV Decel Time: 111 msec    TR Vmax:        216.00 cm/s MV E velocity: 40.50 cm/s MV A velocity: 79.90 cm/s  SHUNTS MV E/A ratio:  0.51        Systemic VTI:  0.10 m                            Systemic Diam: 1.80 cm Rozann Lesches MD Electronically signed by Rozann Lesches MD Signature Date/Time: 12/28/2022/2:02:39 PM    Final    US Venous Img  Lower Bilateral (DVT)  Result Date: 12/28/2022 CLINICAL DATA:  [id number] Leg edema [id number] EXAM: BILATERAL LOWER EXTREMITY VENOUS DOPPLER ULTRASOUND  TECHNIQUE: Gray-scale sonography with compression, as well as color and duplex ultrasound, were performed to evaluate the deep venous system(s) from the level of the common femoral vein through the popliteal and proximal calf veins. COMPARISON:  None Available. FINDINGS: VENOUS Normal compressibility of the common femoral, superficial femoral, and popliteal veins, as well as the visualized calf veins. Visualized portions of profunda femoral vein and great saphenous vein unremarkable. No filling defects to suggest DVT on grayscale or color Doppler imaging. Doppler waveforms show normal direction of venous flow, normal respiratory plasticity and response to augmentation. OTHER Extensive bilateral lower extremity subcutaneous edema. Limitations: none IMPRESSION: Negative. Electronically Signed   By: Lucrezia Europe M.D.   On: 12/28/2022 11:46   [place] ASCITES (ABDOMEN LIMITED)  Result Date: 12/28/2022 CLINICAL DATA:  Pancreatitis.  Ascites? EXAM: LIMITED ABDOMEN ULTRASOUND FOR ASCITES TECHNIQUE: Limited ultrasound survey for ascites was performed in all four abdominal quadrants. COMPARISON:  None Available. FINDINGS: Moderate amount of ascites is present within all 4 quadrants of the abdomen. IMPRESSION: Moderate amount of ascites is present within all 4 quadrants of the abdomen. Electronically Signed   By: Franki Cabot M.D.   On: 12/28/2022 11:41   CT Angio Chest Pulmonary Embolism (PE) W or WO Contrast  Result Date: 12/27/2022 CLINICAL DATA:  Pulmonary embolus suspected with low to intermediate probability. Positive D-dimer. EXAM: CT ANGIOGRAPHY CHEST WITH CONTRAST TECHNIQUE: Multidetector CT imaging of the chest was performed using the standard protocol during bolus administration of intravenous contrast. Multiplanar CT image reconstructions and MIPs were obtained to  evaluate the vascular anatomy. RADIATION DOSE REDUCTION: This exam was performed according to the departmental dose-optimization program which includes automated exposure control, adjustment of the mA and/or kV according to patient size and/or use of iterative reconstruction technique. CONTRAST:  27mL OMNIPAQUE IOHEXOL 350 MG/ML SOLN COMPARISON:  Chest radiograph 324 24.  CT 05/28/2019 FINDINGS: Cardiovascular: There is good opacification of the central and segmental pulmonary arteries. No focal filling defects. No evidence of significant pulmonary embolus. Normal heart size. No pericardial effusions. Normal caliber thoracic aorta. No aortic dissection. Scattered calcification in the aorta and coronary arteries. Mediastinum/Nodes: Thyroid gland is unremarkable. Esophagus is decompressed. No significant lymphadenopathy. Calcified lymph nodes are present consistent with postinflammatory changes. Lungs/Pleura: Moderate bilateral pleural effusions. Bilateral basilar atelectasis. No pneumothorax. Upper Abdomen: Prominent upper abdominal ascites. Cirrhotic changes in the liver. Spleen is enlarged. Cystic lesion involving the tail of the pancreas measuring 4.9 cm diameter, incompletely included within the field of view but unchanged since prior study. 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 Musculoskeletal: Postoperative changes in the cervical spine. No acute bony abnormalities. Review of the MIP images confirms the above findings. IMPRESSION: 1. No evidence of significant pulmonary embolus. 2. Moderate bilateral pleural effusions with basilar atelectasis. 3. Severe upper abdominal ascites. 4. Stable appearance of pancreatic cyst, likely pseudocyst. 5. Likely hepatic cirrhosis. Electronically Signed   By: Lucienne Capers M.D.   On: 12/27/2022 23:32   DG Chest Port 1 View  Result Date: 12/27/2022 CLINICAL DATA:  Shortness of breath. EXAM: PORTABLE CHEST 1 VIEW COMPARISON:  April 16, 2022. FINDINGS: The heart size and mediastinal  contours are within normal limits. Hypoinflation of the lungs with minimal bibasilar subsegmental atelectasis. The visualized skeletal structures are unremarkable. IMPRESSION: Hypoinflation of the lungs with minimal bibasilar subsegmental atelectasis. Electronically Signed   By: Marijo Conception M.D.   On: 12/27/2022 12:30    Orson Eva, DO  [facility]  If 7PM-7AM, please contact night-coverage [email or web address] Password  [email or web address] 12/29/2022, 5:45 PM   LOS: 2 days

## 2022-12-29 NOTE — Progress Notes (Addendum)
CRITICAL VALUE STICKER  CRITICAL VALUE: potassium 2.7  RECEIVER (on-site recipient of call): B. Nancy Marus, RN  DATE & TIME NOTIFIED: 12/29/2022 at [phone]  MESSENGER (representative from lab): Crystal  MD NOTIFIED: Dr. Josephine Cables  TIME OF NOTIFICATION: 12/29/2022 at 0620  RESPONSE: see new orders

## 2022-12-29 NOTE — Progress Notes (Signed)
Inpatient Diabetes Program Recommendations  AACE/ADA: New Consensus Statement on Inpatient Glycemic Control (2015)  Target Ranges:  Prepandial:   less than 140 mg/dL      Peak postprandial:   less than 180 mg/dL (1-2 hours)      Critically ill patients:  140 - 180 mg/dL   Lab Results  Component Value Date   GLUCAP 339 (H) 12/29/2022   HGBA1C 7.2 (H) 12/27/2022    Review of Glycemic Control  Latest Reference Range & Units 12/28/22 07:21 12/28/22 12:26 12/28/22 16:41 12/28/22 21:16 12/29/22 07:39  Glucose-Capillary 70 - 99 mg/dL 229 (H) 76 348 (H) 426 (H) 339 (H)   Diabetes history: DM Outpatient Diabetes medications:  Jardiance 25 mg daily, Tresiba 30 units daily Dexcom G6 Current orders for Inpatient glycemic control:  Novolog 0-15 units tid with meals and HS Semglee 10 units daily Novolog 3 units tid with meals   Inpatient Diabetes Program Recommendations:    Note basal and bolus insulin added today.  Will follow.   Thanks,  Adah Perl, RN, BC-ADM Inpatient Diabetes Coordinator Pager [phone]  (8a-5p)

## 2022-12-30 DIAGNOSIS — R601 Generalized edema: Secondary | ICD-10-CM | POA: Diagnosis not present

## 2022-12-30 LAB — BASIC METABOLIC PANEL
Anion gap: 3 — ABNORMAL LOW (ref 5–15)
BUN: 5 mg/dL — ABNORMAL LOW (ref 6–20)
CO2: 23 mmol/L (ref 22–32)
Calcium: 6.6 mg/dL — ABNORMAL LOW (ref 8.9–10.3)
Chloride: 103 mmol/L (ref 98–111)
Creatinine, Ser: 0.72 mg/dL (ref 0.61–1.24)
GFR, Estimated: >60 mL/min (ref >60)
Glucose, Bld: 391 mg/dL — ABNORMAL HIGH (ref 70–99)
Potassium: 3 mmol/L — ABNORMAL LOW (ref 3.5–5.1)
Sodium: 129 mmol/L — ABNORMAL LOW (ref 135–145)

## 2022-12-30 LAB — GLUCOSE, CAPILLARY
Glucose-Capillary: 382 mg/dL — ABNORMAL HIGH (ref 70–99)
Glucose-Capillary: 204 mg/dL — ABNORMAL HIGH (ref 70–99)
Glucose-Capillary: 168 mg/dL — ABNORMAL HIGH (ref 70–99)
Glucose-Capillary: 158 mg/dL — ABNORMAL HIGH (ref 70–99)

## 2022-12-30 LAB — MAGNESIUM: Magnesium: 1.6 mg/dL — ABNORMAL LOW (ref 1.7–2.4)

## 2022-12-30 MED ORDER — INSULIN GLARGINE-YFGN 100 UNIT/ML ~~LOC~~ SOLN: 10.0000 [IU] | Freq: Once | SUBCUTANEOUS | Status: DC

## 2022-12-30 MED ORDER — SPIRONOLACTONE 25 MG PO TABS
50.0000 mg | ORAL_TABLET | Freq: Every day | ORAL | Status: DC
  Administered 2022-12-30 – 2022-12-31 (×2): 50 mg via ORAL
  Filled 2022-12-30 (×2): qty 2

## 2022-12-30 MED ORDER — PROSOURCE PLUS PO LIQD
60.0000 mL | Freq: Three times a day (TID) | ORAL | Status: DC
  Administered 2022-12-30 – 2022-12-31 (×3): 60 mL via ORAL
  Filled 2022-12-30 (×3): qty 60

## 2022-12-30 MED ORDER — MAGNESIUM SULFATE 2 GM/50ML IV SOLN
2.0000 g | Freq: Once | INTRAVENOUS | Status: AC
  Administered 2022-12-30: 2 g via INTRAVENOUS
  Filled 2022-12-30: qty 50

## 2022-12-30 MED ORDER — INSULIN ASPART 100 UNIT/ML IJ SOLN: 6.0000 [IU] | Freq: Three times a day (TID) | INTRAMUSCULAR | Status: DC

## 2022-12-30 MED ORDER — PANCRELIPASE (LIP-PROT-AMYL) 36000-114000 UNITS PO CPEP
36000.0000 [IU] | ORAL_CAPSULE | Freq: Three times a day (TID) | ORAL | Status: DC
  Administered 2022-12-30 – 2022-12-31 (×4): 36000 [IU] via ORAL
  Filled 2022-12-30 (×4): qty 3

## 2022-12-30 MED ORDER — INSULIN GLARGINE-YFGN 100 UNIT/ML ~~LOC~~ SOLN
20.0000 [IU] | Freq: Every day | SUBCUTANEOUS | Status: DC
  Administered 2022-12-30 – 2022-12-31 (×2): 20 [IU] via SUBCUTANEOUS
  Filled 2022-12-30 (×3): qty 0.2

## 2022-12-30 MED ORDER — INSULIN ASPART 100 UNIT/ML IJ SOLN
3.0000 [IU] | Freq: Three times a day (TID) | INTRAMUSCULAR | Status: DC
  Administered 2022-12-30 – 2022-12-31 (×4): 3 [IU] via SUBCUTANEOUS

## 2022-12-30 MED ORDER — LOPERAMIDE HCL 2 MG PO CAPS
2.0000 mg | ORAL_CAPSULE | Freq: Four times a day (QID) | ORAL | Status: AC
  Administered 2022-12-30 (×4): 2 mg via ORAL
  Filled 2022-12-30 (×4): qty 1

## 2022-12-30 MED ORDER — BANATROL TF EN LIQD
60.0000 mL | Freq: Two times a day (BID) | ENTERAL | Status: DC
  Administered 2022-12-30 – 2022-12-31 (×3): 60 mL
  Filled 2022-12-30 (×3): qty 60

## 2022-12-30 NOTE — Progress Notes (Signed)
Patient slept this shift no complaints of pain. Continued to monitor.

## 2022-12-30 NOTE — Progress Notes (Signed)
[facility] PROGRESS NOTE  Todd Rodriguez (DOB: 12-Dec-1975) [id number] PCP: Burnis Medin, MD Outpatient Specialists: GI, Dr. Abbey Chatters  Brief Narrative: 47 year old male with a history of diabetes mellitus type 2, hypertension, alcohol abuse, upper GI bleed, tobacco abuse chronic pancreatitis with pseudocyst, GERD presenting with increasing lower extremity edema and dyspnea on exertion.  The patient states that for the past 2 weeks he has noted increasing lower extremity edema progressing to his upper thighs and into his scrotum and penis.  He recently saw his PCP who increased his furosemide to 40 mg twice daily 4 days prior to this admission.  He states that has not helped.  His dyspnea on exertion has worsened during this period of time, but he denies any coughing, hemoptysis, chest pain.  He denies any fevers or chills, but states that he feels cold all the time.  He states that he has gained 20 to 30 pounds in the past 2 weeks.  He denies any frank orthopnea or PND. Unfortunately, he continues to drink alcohol.  He states that he has been drinking 2 x 12 ounce beers daily.  He stated that he quit drinking his usual shots of vodka.  He continues to smoke cigarettes.  He denies any illicit drug use.   In addition, the patient has been complaining of loose stools, up to 10 bowel movements daily.  He denies hematochezia or melena.  He denies any frank abdominal pain but has some upper abdominal discomfort.  He denies any recent travels.  He states that he has not eaten any unusual or undercooked foods.  He has had some subjective fevers and chills.  There is no dysuria or hematuria.   In the ED, the patient was afebrile hemodynamically stable with oxygen saturation 100% room air.  WBC 5.6, hemoglobin 12.0, platelets 217,000.  Sodium 133, potassium 2.8, bicarbonate 26, serum creatinine 0.67.  Chest x-ray was poor inspiration but showed bibasilar atelectasis.  The patient was given IV furosemide  in the emergency department and admitted for further evaluation and treatment.   Subjective: Swelling in legs and abdomen down, eager to go home, but swelling still significant. +subjectively brisk UOP after getting lasix. No dyspnea or abd pain or fever.   Objective: BP (!) 117/95 (BP Location: Left Arm)   Pulse (!) 106   Temp 98.4 F (36.9 C) (Oral)   Resp 18   Ht 6\' 2"  (1.88 m)   Wt 81.6 kg   SpO2 100%   BMI 23.11 kg/m   Gen: No distress. Pleasant.  Pulm: Clear, nonlabored  CV: RRR, no MRG GI: Soft, NT, ND, +BS, no definite fluid wave.  Neuro: Alert and oriented, no asterixis or tremor. No new focal deficits. Ext: Warm, no deformities. 3+ pitting edema from feet to abdominal wall. Skin: No jaundice, rashes, lesions or ulcers on visualized skin   Assessment & Plan: Anasarca: Echo EF 55-60%, no WMA, normal RVF, trivial MR. Normal TSH, no significant proteinuria. No DVT on venous U/S LE's.  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 Marked fatty liver July 2023.  - Continue IV lasix, monitoring I/O, daily weights - Add spironolactone with hypokalemia and cirrhotic decompensation with ascites.  - s/p 4L paracentesis 3/26 with SAAG consistent with portal HTN etiology, cytology pending. No SBP currently suspected, gram stain negative, Cx pending.  - Review of the medical record shows that the patient weighed 140 lbs during office visit on 10/09/2022.  His weight was 183 on his office visit 12/22/2022, 171lbs on 3/25 here.    Alcohol abuse:  - Continue CIWA, stable at this time.  - Cessation counseling. Has cut down vodka, continues w/beer.    Diarrhea: Suspect due to pancreatic insufficiency and acutely worse perhaps due to portal hypertensive colopathy. GI panel and C. diff assays negative.  - Start imodium scheduled as trial QID, add back creon, increase dose, consider lomotil if not improving.     Uncontrolled IDT2DM  with hypoglycemia: HbA1c 7.2%.  - SSI, holding glargine with erratic CBGs and hypoglycemic episode though has grown hyperglycemic, restarted glargine, added 3u TIDWC, will increase to 6u TIDWC. Continue HS correction.   Chronic pancreatitis with pseudocyst: 04/16/22 CT A/P with contrast -chronic calcific pancreatitis with pseudocyst at the pancreatic tail measuring 5.5 x 4.7 x 4.9 (decreased from prior).   - Increase creon. Pt may not be completely adherent (per spouse) to this as outpatient, but is well aware of his dosing.    Essential hypertension:  - Holding amlodipine and benazepril to allow BP margin for diuresis   Hypokalemia/hypomagnesemia - Continue aggressive supplementation due to ongoing IV diuresis, ongoing GI losses. Give Mg as well for hypomagnesemia.   Hyponatremia:  - Monitor with lasix.   Patrecia Pour, MD [facility] [email or web address] 12/30/2022, 9:14 AM

## 2022-12-30 NOTE — Progress Notes (Signed)
Initial Nutrition Assessment  DOCUMENTATION CODES:      INTERVENTION:  Banatrol Plus BID   ProSource Plus (60 ml) TID   Daily weights   NUTRITION DIAGNOSIS:   Altered GI function related to acute illness (presented with anasarca - 4 liters ascites removed) concern for decompensated liver cirrhosis per MD AEB pt long time history of alcohol use.    GOAL:  Patient will meet greater than or equal to 90% of their needs   MONITOR:  PO intake, Weight trends, Supplement acceptance, Labs  REASON FOR ASSESSMENT:  Malnutrition Screening Tool    ASSESSMENT: Patient is a 47 yo male with hx of DM2, Chronic pancreatitis (pseudocyst), GERD, HTN, ETOH and tobacco use. Presents with anasarca, significant weight gain (UBW~140 lb).   3/26 paracentesis 4 liters ascites removed. Weight has decreased to 77.7 kg /170.9 lbs (standing weight today @1400 ). Patient continues with significant lower extremity pitting edema.   Patient spouse bedside and assisted with history. Patient is awake and eating a snack during visit. Affirms that he has a good appetite. Providing low sodium foods and patient encouraged to limit excess fluid intake. Talked with him regarding added soluble fiber source due to his ongoing diarrhea. He is agreeable to try the Banatrol Plus.   Medications reviewed and include:  folvite (1 mg/d), lasix (40 mg BID), Klor-con 40 mEq BID, Insulin (0-15 units TID w/meals), Creon-36K units with meals and Thiamine 100 mg daily.  CBG (last 3)  Recent Labs    12/29/22 2105 12/30/22 0735 12/30/22 1154  GLUCAP 304* 382* 204*     3/24- A1C-7.2%    Latest Ref Rng & Units 12/30/2022    4:23 AM 12/29/2022    4:24 AM 12/28/2022    5:20 AM  BMP  Glucose 70 - 99 mg/dL 391  292  293   BUN 6 - 20 mg/dL 5  5  <5   Creatinine 0.61 - 1.24 mg/dL 0.72  0.78  0.76   Sodium 135 - 145 mmol/L 129  133  134   Potassium 3.5 - 5.1 mmol/L 3.0  2.7  3.1   Chloride 98 - 111 mmol/L 103  101  101   CO2 22 -  32 mmol/L 23  26  26    Calcium 8.9 - 10.3 mg/dL 6.6  7.1  7.1      NUTRITION - FOCUSED PHYSICAL EXAM:  Deferred to follow up  Diet Order:   Diet Order             Diet 2 gram sodium Room service appropriate? Yes; Fluid consistency: Thin  Diet effective now                   EDUCATION NEEDS:  Education needs have been addressed  Skin:  Skin Assessment: Reviewed RN Assessment  Last BM:  3/26 patient spouse reports persisting diarrhea for > 48yr (wear depends at home due to occasional incontinence).  Height:   Ht Readings from Last 1 Encounters:  12/27/22 6\' 2"  (1.88 m)    Weight:   Wt Readings from Last 1 Encounters:  12/30/22 77.7 kg    Ideal Body Weight:   86 kg  BMI:  Body mass index is 21.99 kg/m.  Estimated Nutritional Needs:   Kcal:  [id number]  Protein:  130-145 gr  Fluid:  2 liters daily (normal needs)   Colman Cater MS,RD,CSG,LDN Contact: AMION

## 2022-12-30 NOTE — Progress Notes (Signed)
Inpatient Diabetes Program Recommendations  AACE/ADA: New Consensus Statement on Inpatient Glycemic Control (2015)  Target Ranges:  Prepandial:   less than 140 mg/dL      Peak postprandial:   less than 180 mg/dL (1-2 hours)      Critically ill patients:  140 - 180 mg/dL   Lab Results  Component Value Date   GLUCAP 382 (H) 12/30/2022   HGBA1C 7.2 (H) 12/27/2022    Review of Glycemic Control  Latest Reference Range & Units 12/28/22 07:21 12/28/22 12:26 12/28/22 16:41 12/28/22 21:16 12/29/22 07:39 12/29/22 11:44 12/29/22 16:24 12/29/22 21:05 12/30/22 07:35  Glucose-Capillary 70 - 99 mg/dL 229 (H) 76 348 (H) 426 (H) 339 (H) 130 (H) 170 (H) 304 (H) 382 (H)   Diabetes history: DM  Outpatient Diabetes medications:  Dexcom sensor Jardiance 25 mg daily Novolog 6 units tid with meals (per med. rec. Pt. Not taking)  Tyler Aas 30 units daily Current orders for Inpatient glycemic control:  Novolog moderate tid with meals and HS Novolog 6 units tid with meals Semglee 10 units daily Inpatient Diabetes Program Recommendations:    Recommend increasing Semglee to 20 units daily and reduce Novolog meal coverage back to 4 units tid with meals.   Thanks,  Adah Perl, RN, BC-ADM Inpatient Diabetes Coordinator Pager [phone]  (8a-5p)

## 2022-12-31 DIAGNOSIS — K7460 Unspecified cirrhosis of liver: Secondary | ICD-10-CM

## 2022-12-31 DIAGNOSIS — E1169 Type 2 diabetes mellitus with other specified complication: Secondary | ICD-10-CM | POA: Diagnosis not present

## 2022-12-31 DIAGNOSIS — K219 Gastro-esophageal reflux disease without esophagitis: Secondary | ICD-10-CM

## 2022-12-31 DIAGNOSIS — E785 Hyperlipidemia, unspecified: Secondary | ICD-10-CM

## 2022-12-31 DIAGNOSIS — K860 Alcohol-induced chronic pancreatitis: Secondary | ICD-10-CM | POA: Diagnosis not present

## 2022-12-31 DIAGNOSIS — F1010 Alcohol abuse, uncomplicated: Secondary | ICD-10-CM | POA: Diagnosis not present

## 2022-12-31 DIAGNOSIS — R188 Other ascites: Secondary | ICD-10-CM

## 2022-12-31 LAB — CBC
HCT: 27.6 % — ABNORMAL LOW (ref 39.0–52.0)
Hemoglobin: 8.8 g/dL — ABNORMAL LOW (ref 13.0–17.0)
MCH: 30.6 pg (ref 26.0–34.0)
MCHC: 31.9 g/dL (ref 30.0–36.0)
MCV: 95.8 fL (ref 80.0–100.0)
Platelets: 116 10*3/uL — ABNORMAL LOW (ref 150–400)
RBC: 2.88 MIL/uL — ABNORMAL LOW (ref 4.22–5.81)
RDW: 14 % (ref 11.5–15.5)
WBC: 6.1 10*3/uL (ref 4.0–10.5)
nRBC: 0 % (ref 0.0–0.2)

## 2022-12-31 LAB — COMPREHENSIVE METABOLIC PANEL
ALT: 15 U/L (ref 0–44)
AST: 17 U/L (ref 15–41)
Albumin: 1.5 g/dL — ABNORMAL LOW (ref 3.5–5.0)
Alkaline Phosphatase: 109 U/L (ref 38–126)
Anion gap: 3 — ABNORMAL LOW (ref 5–15)
BUN: 6 mg/dL (ref 6–20)
CO2: 24 mmol/L (ref 22–32)
Calcium: 7 mg/dL — ABNORMAL LOW (ref 8.9–10.3)
Chloride: 106 mmol/L (ref 98–111)
Creatinine, Ser: 0.67 mg/dL (ref 0.61–1.24)
GFR, Estimated: >60 mL/min (ref >60)
Glucose, Bld: 185 mg/dL — ABNORMAL HIGH (ref 70–99)
Potassium: 4.1 mmol/L (ref 3.5–5.1)
Sodium: 133 mmol/L — ABNORMAL LOW (ref 135–145)
Total Bilirubin: 1.5 mg/dL — ABNORMAL HIGH (ref 0.3–1.2)
Total Protein: 4.2 g/dL — ABNORMAL LOW (ref 6.5–8.1)

## 2022-12-31 LAB — CYTOLOGY - NON PAP

## 2022-12-31 LAB — GLUCOSE, CAPILLARY
Glucose-Capillary: 272 mg/dL — ABNORMAL HIGH (ref 70–99)
Glucose-Capillary: 253 mg/dL — ABNORMAL HIGH (ref 70–99)

## 2022-12-31 LAB — MAGNESIUM: Magnesium: 1.7 mg/dL (ref 1.7–2.4)

## 2022-12-31 MED ORDER — PROSOURCE PLUS PO LIQD: 60.0000 mL | Freq: Three times a day (TID) | ORAL | 0 refills | Status: AC

## 2022-12-31 MED ORDER — METOPROLOL TARTRATE 25 MG PO TABS: 12.5000 mg | ORAL_TABLET | Freq: Two times a day (BID) | ORAL | 0 refills | Status: AC

## 2022-12-31 MED ORDER — TRESIBA FLEXTOUCH 100 UNIT/ML ~~LOC~~ SOPN: 20.0000 [IU] | PEN_INJECTOR | Freq: Every day | SUBCUTANEOUS | 0 refills | Status: DC

## 2022-12-31 MED ORDER — BANATROL TF EN LIQD: 60.0000 mL | Freq: Two times a day (BID) | ENTERAL | Status: DC

## 2022-12-31 MED ORDER — POTASSIUM CHLORIDE CRYS ER 10 MEQ PO TBCR: 10.0000 meq | EXTENDED_RELEASE_TABLET | Freq: Every day | ORAL | 0 refills | Status: DC

## 2022-12-31 MED ORDER — SPIRONOLACTONE 50 MG PO TABS: 50.0000 mg | ORAL_TABLET | Freq: Every day | ORAL | 0 refills | Status: DC

## 2022-12-31 MED ORDER — FUROSEMIDE 20 MG PO TABS: 40.0000 mg | ORAL_TABLET | Freq: Two times a day (BID) | ORAL | 0 refills | Status: DC

## 2022-12-31 MED ORDER — PANCRELIPASE (LIP-PROT-AMYL) 36000-114000 UNITS PO CPEP: 36000.0000 [IU] | ORAL_CAPSULE | Freq: Three times a day (TID) | ORAL | 0 refills | Status: DC

## 2022-12-31 MED ORDER — ADULT MULTIVITAMIN W/MINERALS CH: 1.0000 | ORAL_TABLET | Freq: Every day | ORAL | Status: DC

## 2022-12-31 NOTE — Assessment & Plan Note (Signed)
Patient was placed on insulin therapy during his hospitalization, basal and sliding scale.  Continue empaagliflozin at discharge.

## 2022-12-31 NOTE — Progress Notes (Signed)
Nsg Discharge Note  Admit Date:  12/27/2022 Discharge date: 12/31/2022   Meco Schlafer to be D/C'd Home per MD order.  AVS completed.  Patient/caregiver able to verbalize understanding.  Discharge Medication: Allergies as of 12/31/2022   No Known Allergies      Medication List     STOP taking these medications    benazepril 10 MG tablet Commonly known as: LOTENSIN   citalopram 20 MG tablet Commonly known as: CELEXA   NovoLOG FlexPen 100 UNIT/ML FlexPen Generic drug: insulin aspart       TAKE these medications    (feeding supplement) PROSource Plus liquid Take 60 mLs by mouth 3 (three) times daily between meals.   albuterol 108 (90 Base) MCG/ACT inhaler Commonly known as: VENTOLIN HFA Inhale 2 puffs into the lungs every 4 (four) hours as needed for wheezing or shortness of breath.   atorvastatin 40 MG tablet Commonly known as: LIPITOR Take 1 tablet (40 mg total) by mouth daily.   B COMPLEX-C-FOLIC ACID PO Take 0.8 mg by mouth daily.   BD Pen Needle Nano 2nd Gen 32G X 4 MM Misc Generic drug: Insulin Pen Needle USE AS DIRECTED   Dexcom G6 Sensor Misc 1 Device by Does not apply route as directed.   Dexcom G6 Transmitter Misc 1 Device by Does not apply route as directed.   empagliflozin 25 MG Tabs tablet Commonly known as: Jardiance Take 1 tablet (25 mg total) by mouth daily before breakfast.   folic acid 1 MG tablet Commonly known as: FOLVITE Take 1 tablet (1 mg total) by mouth daily.   furosemide 20 MG tablet Commonly known as: LASIX Take 2 tablets (40 mg total) by mouth 2 (two) times daily.   gabapentin 300 MG capsule Commonly known as: NEURONTIN Take 2 capsules (600 mg total) by mouth 3 (three) times daily.   lipase/protease/amylase 36000 UNITS Cpep capsule Commonly known as: CREON Take 1 capsule (36,000 Units total) by mouth 3 (three) times daily with meals.   LORazepam 1 MG tablet Commonly known as: Ativan Take 1 tablet (1 mg total) by  mouth every 8 (eight) hours as needed for anxiety. Panic attacks   metoprolol tartrate 25 MG tablet Commonly known as: LOPRESSOR Take 0.5 tablets (12.5 mg total) by mouth 2 (two) times daily.   multivitamin with minerals Tabs tablet Take 1 tablet by mouth daily. Start taking on: January 01, 2023   ONE-A-DAY MENS (MINERALS) PO Take 1 tablet by mouth daily.   pantoprazole 40 MG tablet Commonly known as: PROTONIX TAKE 1 TABLET BY MOUTH EVERY DAY   potassium chloride 10 MEQ tablet Commonly known as: KLOR-CON M Take 1 tablet (10 mEq total) by mouth daily. What changed:  medication strength how much to take   spironolactone 50 MG tablet Commonly known as: ALDACTONE Take 1 tablet (50 mg total) by mouth daily. Start taking on: January 01, 2023   thiamine 100 MG tablet Commonly known as: VITAMIN B1 Take 1 tablet (100 mg total) by mouth daily. What changed:  how much to take when to take this   traZODone 50 MG tablet Commonly known as: DESYREL Take 1 tablet (50 mg total) by mouth at bedtime.   Tyler Aas FlexTouch 100 UNIT/ML FlexTouch Pen Generic drug: insulin degludec Inject 20 Units into the skin daily. What changed: how much to take   VITAMIN B-12 PO Take 1 tablet by mouth daily.        Discharge Assessment: Vitals:   12/31/22 [id number] 12/31/22 [id number]  BP: 121/81 119/75  Pulse: 90 87  Resp: 18   Temp: (!) 97 F (36.1 C)   SpO2: 100%    Skin clean, dry and intact without evidence of skin break down, no evidence of skin tears noted. IV catheter discontinued intact. Site without signs and symptoms of complications - no redness or edema noted at insertion site, patient denies c/o pain - only slight tenderness at site.  Dressing with slight pressure applied.  D/c Instructions-Education: Discharge instructions given to patient/family with verbalized understanding. D/c education completed with patient/family including follow up instructions, medication list, d/c activities  limitations if indicated, with other d/c instructions as indicated by MD - patient able to verbalize understanding, all questions fully answered. Patient instructed to return to ED, call 911, or call MD for any changes in condition.  Patient escorted via [place], and D/C home via private auto.  Kathie Rhodes, RN 12/31/2022 2:15 PM

## 2022-12-31 NOTE — Inpatient Diabetes Management (Signed)
Inpatient Diabetes Program Recommendations  AACE/ADA: New Consensus Statement on Inpatient Glycemic Control (2015)  Target Ranges:  Prepandial:   less than 140 mg/dL      Peak postprandial:   less than 180 mg/dL (1-2 hours)      Critically ill patients:  140 - 180 mg/dL   Lab Results  Component Value Date   GLUCAP 253 (H) 12/31/2022   HGBA1C 7.2 (H) 12/27/2022    Latest Reference Range & Units 12/30/22 11:54 12/30/22 16:19 12/30/22 20:22 12/31/22 07:52 12/31/22 11:11  Glucose-Capillary 70 - 99 mg/dL 204 (H) 168 (H) 158 (H) 272 (H) 253 (H)  (H): Data is abnormally high Review of Glycemic Control Diabetes history: DM   Outpatient Diabetes medications:  Dexcom sensor Jardiance 25 mg daily Novolog 6 units tid with meals (per med. rec. Pt. Not taking)  Tyler Aas 30 units daily  Current orders for Inpatient glycemic control:  Novolog moderate tid with meals and HS Novolog 3 units tid with meals Semglee 20 units daily  Inpatient Diabetes Program Recommendations:   Noted that blood sugars have been greater than 180 mg/dl. Recommend increasing Novolog meal coverage to 5 units TID if blood sugars continue to be elevated. Requiring meal supplements.   Harvel Ricks RN BSN CDE Diabetes Coordinator Pager: [phone]  8am-5pm

## 2022-12-31 NOTE — Assessment & Plan Note (Addendum)
Blood pressure has been controlled with metoprolol. Now on furosemide and spironolactone for diuresis.

## 2022-12-31 NOTE — Progress Notes (Signed)
Patient slept the whole shift only waking when staff enter the room for vitals. No complaints of pain. Continued to monitor.

## 2022-12-31 NOTE — Assessment & Plan Note (Signed)
Continue antiacid therapy  ?

## 2022-12-31 NOTE — Assessment & Plan Note (Signed)
Advised to avoid alcohol.   Tobacco abuse, smoking cessation counseling.

## 2022-12-31 NOTE — Assessment & Plan Note (Signed)
03/24 CT chest with cirrhotic liver with splenomegaly, 04/2022 with marked fatty liver.  Patient with history of alcohol abuse.   Patient was placed on diuretic therapy with improvement in his edema.  Paracentesis was performed removing 4 L ascitic fluid with no signs of fluid infection. Cytology pending   Patient will continue diuretic therapy with furosemide and spironolactone.  Follow up with primary care in 7 to 10 days.  Follow up with GI as outpatient.

## 2022-12-31 NOTE — Care Management Important Message (Signed)
Important Message  Patient Details  Name: Todd Rodriguez MRN: [id number] Date of Birth: 1975/11/13   Medicare Important Message Given:  Yes     Tommy Medal 12/31/2022, 1:27 PM

## 2022-12-31 NOTE — Discharge Summary (Signed)
Physician Discharge Summary   Patient: Todd Rodriguez MRN: [id number] DOB: [birth year]  Admit date:     12/27/2022  Discharge date: 12/31/22  Discharge Physician: Jimmy Picket Marline Morace   PCP: Burnis Medin, MD   Recommendations at discharge:    Patient will continue diuresis with furosemide and spironolactone Reduced K supplements to 10 meq daily Follow up renal function and electrolytes in 7 days.  Discontinue ACE inh and place on  metoprolol for blood pressure control.  Added pancreatic enzymes for pancreatic  insufficiency.  Follow up with Dr Sarajane Jews in 7 to 10 days. Follow up with GI as outpatient.   Discharge Diagnoses: Principal Problem:   Liver cirrhosis ([place]) Active Problems:   Chronic pancreatitis ([place])   Essential hypertension, benign   Type 2 diabetes mellitus with hyperlipidemia (HCC)   Alcohol abuse   GERD (gastroesophageal reflux disease)  Resolved Problems:   * No resolved hospital problems. [facility] Course: Mrs. Rondon was admitted to the hospital with the working diagnosis of anasarca.   47 year old male with a history of diabetes mellitus type 2, hypertension, alcohol abuse, upper GI bleed, tobacco abuse chronic pancreatitis with pseudocyst, GERD presenting with increasing lower extremity edema and dyspnea on exertion.  The patient states that for the past 2 weeks he has noted increasing lower extremity edema progressing to his upper thighs and into his scrotum and penis.  He recently saw his PCP who increased his furosemide to 40 mg twice daily 4 days prior to this admission.  He states that has not helped.  His dyspnea on exertion has worsened during this period of time, but he denies any coughing, hemoptysis, chest pain.  He denies any fevers or chills, but states that he feels cold all the time.  He states that he has gained 20 to 30 pounds in the past 2 weeks.  He denies any frank orthopnea or PND. Unfortunately, he continues to drink alcohol.  He states  that he has been drinking 2 x 12 ounce beers daily.  He stated that he quit drinking his usual shots of vodka.  He continues to smoke cigarettes.  He denies any illicit drug use.  In addition, the patient has been complaining of loose stools, up to 10 bowel movements daily.  He denies hematochezia or melena.  He denies any frank abdominal pain but has some upper abdominal discomfort.  He denies any recent travels.  He states that he has not eaten any unusual or undercooked foods.  He has had some subjective fevers and chills.  There is no dysuria or hematuria.  In the ED, the patient was afebrile hemodynamically stable with oxygen saturation 100% room air.   WBC 5.6, hemoglobin 12.0, platelets 217,000.   Sodium 133, potassium 2.8, bicarbonate 26, serum creatinine 0.67.  Sars covid 19 negative  Urine analysis SG 1,005, negative protein, negative leukocytes.   Chest x-ray was poor inspiration but showed bibasilar atelectasis.    CT chest with no evidence of pulmonary embolism.  Moderate bilateral pleural effusions. Severe upper abdominal ascites.  Likely hepatic cirrhosis.   EKG 88 bpm, normal axis, normal intervals, sinus rhythm with no significant ST segment or T wave changes.   The patient was given IV furosemide in the emergency department and admitted for further evaluation and treatment. 03/26 paracentesis 4L removed.  03/28 volume status continue to improve.  Patient will continue diuresis at home and will have close follow up as outpatient.   Assessment and Plan: * Liver  cirrhosis ([place]) 03/24 CT chest with cirrhotic liver with splenomegaly, 04/2022 with marked fatty liver.  Patient with history of alcohol abuse.   Patient was placed on diuretic therapy with improvement in his edema.  Paracentesis was performed removing 4 L ascitic fluid with no signs of fluid infection. Cytology pending   Patient will continue diuretic therapy with furosemide and spironolactone.  Follow up  with primary care in 7 to 10 days.  Follow up with GI as outpatient.   Chronic pancreatitis ([place]) Continue pancreatic enzymes. Follow up as outpatient.  Avoid further alcohol consumption.   Essential hypertension, benign Blood pressure has been controlled with metoprolol. Now on furosemide and spironolactone for diuresis.   Type 2 diabetes mellitus with hyperlipidemia ([place]) Patient was placed on insulin therapy during his hospitalization, basal and sliding scale.  Continue empaagliflozin at discharge.   Alcohol abuse Advised to avoid alcohol.   Tobacco abuse, smoking cessation counseling.   GERD (gastroesophageal reflux disease) Continue antiacid therapy.          Consultants: none Procedures performed: paracentesis  Disposition: Home Diet recommendation:  Cardiac diet DISCHARGE MEDICATION: Allergies as of 12/31/2022   No Known Allergies      Medication List     STOP taking these medications    benazepril 10 MG tablet Commonly known as: LOTENSIN   citalopram 20 MG tablet Commonly known as: CELEXA   NovoLOG FlexPen 100 UNIT/ML FlexPen Generic drug: insulin aspart       TAKE these medications    (feeding supplement) PROSource Plus liquid Take 60 mLs by mouth 3 (three) times daily between meals.   albuterol 108 (90 Base) MCG/ACT inhaler Commonly known as: VENTOLIN HFA Inhale 2 puffs into the lungs every 4 (four) hours as needed for wheezing or shortness of breath.   atorvastatin 40 MG tablet Commonly known as: LIPITOR Take 1 tablet (40 mg total) by mouth daily.   B COMPLEX-C-FOLIC ACID PO Take 0.8 mg by mouth daily.   BD Pen Needle Nano 2nd Gen 32G X 4 MM Misc Generic drug: Insulin Pen Needle USE AS DIRECTED   Dexcom G6 Sensor Misc 1 Device by Does not apply route as directed.   Dexcom G6 Transmitter Misc 1 Device by Does not apply route as directed.   empagliflozin 25 MG Tabs tablet Commonly known as: Jardiance Take 1 tablet (25 mg  total) by mouth daily before breakfast.   folic acid 1 MG tablet Commonly known as: FOLVITE Take 1 tablet (1 mg total) by mouth daily.   furosemide 20 MG tablet Commonly known as: LASIX Take 2 tablets (40 mg total) by mouth 2 (two) times daily.   gabapentin 300 MG capsule Commonly known as: NEURONTIN Take 2 capsules (600 mg total) by mouth 3 (three) times daily.   lipase/protease/amylase 36000 UNITS Cpep capsule Commonly known as: CREON Take 1 capsule (36,000 Units total) by mouth 3 (three) times daily with meals.   LORazepam 1 MG tablet Commonly known as: Ativan Take 1 tablet (1 mg total) by mouth every 8 (eight) hours as needed for anxiety. Panic attacks   metoprolol tartrate 25 MG tablet Commonly known as: LOPRESSOR Take 0.5 tablets (12.5 mg total) by mouth 2 (two) times daily.   multivitamin with minerals Tabs tablet Take 1 tablet by mouth daily. Start taking on: January 01, 2023   ONE-A-DAY MENS (MINERALS) PO Take 1 tablet by mouth daily.   pantoprazole 40 MG tablet Commonly known as: PROTONIX TAKE 1 TABLET BY MOUTH EVERY  DAY   potassium chloride 10 MEQ tablet Commonly known as: KLOR-CON M Take 1 tablet (10 mEq total) by mouth daily. What changed:  medication strength how much to take   spironolactone 50 MG tablet Commonly known as: ALDACTONE Take 1 tablet (50 mg total) by mouth daily. Start taking on: January 01, 2023   thiamine 100 MG tablet Commonly known as: VITAMIN B1 Take 1 tablet (100 mg total) by mouth daily. What changed:  how much to take when to take this   traZODone 50 MG tablet Commonly known as: DESYREL Take 1 tablet (50 mg total) by mouth at bedtime.   Tyler Aas FlexTouch 100 UNIT/ML FlexTouch Pen Generic drug: insulin degludec Inject 20 Units into the skin daily. What changed: how much to take   VITAMIN B-12 PO Take 1 tablet by mouth daily.        Follow-up Information     Services, [facility]. Schedule an appointment as  soon as possible for a visit.   Why: for substance abuse couseling. Contact information: [place] [address] [phone]         Laurey Morale, MD Follow up in 1 week(s).   Specialty: Family Medicine Contact information: [place] [address] [phone]                Discharge Exam: Filed Weights   12/30/22 1005 12/30/22 1403 12/31/22 0620  Weight: 96.8 kg 77.7 kg 77.4 kg   BP 119/75   Pulse 87   Temp (!) 97 F (36.1 C)   Resp 18   Ht 6\' 2"  (1.88 m)   Wt 77.4 kg   SpO2 100%   BMI 21.91 kg/m   Patient is feeling better, no chest pain or dyspnea, lower extremity edema with significant improvement.  Neurology awake and alert ENT with mild pallor with no icterus Cardiovascular with S1 and S2 present and rhythmic with no gallops, rubs or murmurs Respiratory with no rales or wheezing Abdomen with mild distention, non tender and no significant ascites.  Positive lower extremity edema, pitting, (improved from admission).   Condition at discharge: stable  The results of significant diagnostics from this hospitalization (including imaging, microbiology, ancillary and laboratory) are listed below for reference.   Imaging Studies: US Paracentesis  Result Date: 12/29/2022 INDICATION: Ascites EXAM: ULTRASOUND GUIDED RIGHT LOWER QUADRANT PARACENTESIS MEDICATIONS: None. COMPLICATIONS: None immediate. PROCEDURE: Informed written consent was obtained from the patient after a discussion of the risks, benefits and alternatives to treatment. A timeout was performed prior to the initiation of the procedure. Initial ultrasound scanning demonstrates a large amount of ascites within the right lower abdominal quadrant. The right lower abdomen was prepped and draped in the usual sterile fashion. 1% lidocaine was used for local anesthesia. Following this, a 8 Fr Safe-T-Centesis catheter was introduced. An ultrasound image was saved for  documentation purposes. The paracentesis was performed. The catheter was removed and a dressing was applied. The patient tolerated the procedure well without immediate post procedural complication. Patient received post-procedure intravenous albumin; see nursing notes for details. FINDINGS: A total of approximately 4 L of straw-colored fluid was removed. Samples were sent to the laboratory as requested by the clinical team. IMPRESSION: Successful ultrasound-guided paracentesis yielding 4 liters of peritoneal fluid. Electronically Signed   By: Kerby Moors M.D.   On: 12/29/2022 15:16   ECHOCARDIOGRAM COMPLETE  Result Date: 12/28/2022    ECHOCARDIOGRAM REPORT   Patient Name:   [facility]  Date of Exam: 12/28/2022 Medical Rec #:  [id number]      Height:       74.0 in Accession #:    [id number]     Weight:       180.0 lb Date of Birth:  1975-12-30      BSA:          2.078 m Patient Age:    47 years       BP:           119/86 mmHg Patient Gender: M              HR:           86 bpm. Exam Location:  [facility] Procedure: 2D Echo, Cardiac Doppler and Color Doppler Indications:    Dyspnea R06.00  History:        Patient has no prior history of Echocardiogram examinations.                 Signs/Symptoms:Dyspnea; Risk Factors:Hypertension, Current                 Smoker and Diabetes.  Sonographer:    Greer Pickerel Referring Phys: [phone] DAVID TAT  Sonographer Comments: Image acquisition challenging due to patient body habitus and Image acquisition challenging due to respiratory motion. IMPRESSIONS  1. Left ventricular ejection fraction, by estimation, is 55 to 60%. The left ventricle has normal function. The left ventricle has no regional wall motion abnormalities. Left ventricular diastolic parameters are indeterminate.  2. Right ventricular systolic function is normal. The right ventricular size is normal. There is normal pulmonary artery systolic pressure. The estimated right ventricular systolic pressure is Q000111Q mmHg.   3. The mitral valve is grossly normal. Trivial mitral valve regurgitation.  4. The aortic valve is tricuspid. Aortic valve regurgitation is not visualized.  5. The inferior vena cava is normal in size with <50% respiratory variability, suggesting right atrial pressure of 8 mmHg. Comparison(s): No prior Echocardiogram. FINDINGS  Left Ventricle: Left ventricular ejection fraction, by estimation, is 55 to 60%. The left ventricle has normal function. The left ventricle has no regional wall motion abnormalities. The left ventricular internal cavity size was normal in size. There is  no left ventricular hypertrophy. Left ventricular diastolic parameters are indeterminate. Right Ventricle: The right ventricular size is normal. No increase in right ventricular wall thickness. Right ventricular systolic function is normal. There is normal pulmonary artery systolic pressure. The tricuspid regurgitant velocity is 2.16 m/s, and  with an assumed right atrial pressure of 8 mmHg, the estimated right ventricular systolic pressure is Q000111Q mmHg. Left Atrium: Left atrial size was normal in size. Right Atrium: Right atrial size was normal in size. Pericardium: There is no evidence of pericardial effusion. Mitral Valve: The mitral valve is grossly normal. Trivial mitral valve regurgitation. Tricuspid Valve: The tricuspid valve is grossly normal. Tricuspid valve regurgitation is trivial. Aortic Valve: The aortic valve is tricuspid. There is mild aortic valve annular calcification. Aortic valve regurgitation is not visualized. Pulmonic Valve: The pulmonic valve was grossly normal. Pulmonic valve regurgitation is trivial. Aorta: The aortic root is normal in size and structure. Venous: The inferior vena cava is normal in size with less than 50% respiratory variability, suggesting right atrial pressure of 8 mmHg. IAS/Shunts: No atrial level shunt detected by color flow Doppler.  LEFT VENTRICLE PLAX 2D LVIDd:         3.90 cm     Diastology  LVIDs:  3.10 cm     LV e' medial:    10.30 cm/s LV PW:         1.00 cm     LV E/e' medial:  3.9 LV IVS:        0.70 cm     LV e' lateral:   11.30 cm/s LVOT diam:     1.80 cm     LV E/e' lateral: 3.6 LV SV:         26 LV SV Index:   13 LVOT Area:     2.54 cm  LV Volumes (MOD) LV vol d, MOD A2C: 59.1 ml LV vol d, MOD A4C: 48.2 ml LV vol s, MOD A2C: 34.6 ml LV vol s, MOD A4C: 32.0 ml LV SV MOD A2C:     24.5 ml LV SV MOD A4C:     48.2 ml LV SV MOD BP:      20.6 ml RIGHT VENTRICLE RV S prime:     11.40 cm/s TAPSE (M-mode): 1.6 cm LEFT ATRIUM             Index        RIGHT ATRIUM          Index LA diam:        3.50 cm 1.68 cm/m   RA Area:     6.37 cm LA Vol (A2C):   46.9 ml 22.57 ml/m  RA Volume:   9.20 ml  4.43 ml/m LA Vol (A4C):   29.3 ml 14.10 ml/m LA Biplane Vol: 36.9 ml 17.76 ml/m  AORTIC VALVE LVOT Vmax:   65.60 cm/s LVOT Vmean:  43.200 cm/s LVOT VTI:    0.104 m  AORTA Ao Root diam: 3.50 cm MITRAL VALVE               TRICUSPID VALVE MV Area (PHT): 6.83 cm    TR Peak grad:   18.7 mmHg MV Decel Time: 111 msec    TR Vmax:        216.00 cm/s MV E velocity: 40.50 cm/s MV A velocity: 79.90 cm/s  SHUNTS MV E/A ratio:  0.51        Systemic VTI:  0.10 m                            Systemic Diam: 1.80 cm Rozann Lesches MD Electronically signed by Rozann Lesches MD Signature Date/Time: 12/28/2022/2:02:39 PM    Final    US Venous Img Lower Bilateral (DVT)  Result Date: 12/28/2022 CLINICAL DATA:  [id number] Leg edema [id number] EXAM: BILATERAL LOWER EXTREMITY VENOUS DOPPLER ULTRASOUND TECHNIQUE: Gray-scale sonography with compression, as well as color and duplex ultrasound, were performed to evaluate the deep venous system(s) from the level of the common femoral vein through the popliteal and proximal calf veins. COMPARISON:  None Available. FINDINGS: VENOUS Normal compressibility of the common femoral, superficial femoral, and popliteal veins, as well as the visualized calf veins. Visualized portions of profunda  femoral vein and great saphenous vein unremarkable. No filling defects to suggest DVT on grayscale or color Doppler imaging. Doppler waveforms show normal direction of venous flow, normal respiratory plasticity and response to augmentation. OTHER Extensive bilateral lower extremity subcutaneous edema. Limitations: none IMPRESSION: Negative. Electronically Signed   By: Lucrezia Europe M.D.   On: 12/28/2022 11:46   [place] ASCITES (ABDOMEN LIMITED)  Result Date: 12/28/2022 CLINICAL DATA:  Pancreatitis.  Ascites? EXAM: LIMITED ABDOMEN ULTRASOUND FOR ASCITES TECHNIQUE: Limited ultrasound  survey for ascites was performed in all four abdominal quadrants. COMPARISON:  None Available. FINDINGS: Moderate amount of ascites is present within all 4 quadrants of the abdomen. IMPRESSION: Moderate amount of ascites is present within all 4 quadrants of the abdomen. Electronically Signed   By: Franki Cabot M.D.   On: 12/28/2022 11:41   CT Angio Chest Pulmonary Embolism (PE) W or WO Contrast  Result Date: 12/27/2022 CLINICAL DATA:  Pulmonary embolus suspected with low to intermediate probability. Positive D-dimer. EXAM: CT ANGIOGRAPHY CHEST WITH CONTRAST TECHNIQUE: Multidetector CT imaging of the chest was performed using the standard protocol during bolus administration of intravenous contrast. Multiplanar CT image reconstructions and MIPs were obtained to evaluate the vascular anatomy. RADIATION DOSE REDUCTION: This exam was performed according to the departmental dose-optimization program which includes automated exposure control, adjustment of the mA and/or kV according to patient size and/or use of iterative reconstruction technique. CONTRAST:  41mL OMNIPAQUE IOHEXOL 350 MG/ML SOLN COMPARISON:  Chest radiograph 324 24.  CT 05/28/2019 FINDINGS: Cardiovascular: There is good opacification of the central and segmental pulmonary arteries. No focal filling defects. No evidence of significant pulmonary embolus. Normal heart size. No  pericardial effusions. Normal caliber thoracic aorta. No aortic dissection. Scattered calcification in the aorta and coronary arteries. Mediastinum/Nodes: Thyroid gland is unremarkable. Esophagus is decompressed. No significant lymphadenopathy. Calcified lymph nodes are present consistent with postinflammatory changes. Lungs/Pleura: Moderate bilateral pleural effusions. Bilateral basilar atelectasis. No pneumothorax. Upper Abdomen: Prominent upper abdominal ascites. Cirrhotic changes in the liver. Spleen is enlarged. Cystic lesion involving the tail of the pancreas measuring 4.9 cm diameter, incompletely included within the field of view but unchanged since prior study. 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 Musculoskeletal: Postoperative changes in the cervical spine. No acute bony abnormalities. Review of the MIP images confirms the above findings. IMPRESSION: 1. No evidence of significant pulmonary embolus. 2. Moderate bilateral pleural effusions with basilar atelectasis. 3. Severe upper abdominal ascites. 4. Stable appearance of pancreatic cyst, likely pseudocyst. 5. Likely hepatic cirrhosis. Electronically Signed   By: Lucienne Capers M.D.   On: 12/27/2022 23:32   DG Chest Port 1 View  Result Date: 12/27/2022 CLINICAL DATA:  Shortness of breath. EXAM: PORTABLE CHEST 1 VIEW COMPARISON:  April 16, 2022. FINDINGS: The heart size and mediastinal contours are within normal limits. Hypoinflation of the lungs with minimal bibasilar subsegmental atelectasis. The visualized skeletal structures are unremarkable. IMPRESSION: Hypoinflation of the lungs with minimal bibasilar subsegmental atelectasis. Electronically Signed   By: Marijo Conception M.D.   On: 12/27/2022 12:30    Microbiology: Results for orders placed or performed during the hospital encounter of 12/27/22  Resp panel by RT-PCR (RSV, Flu A&B, Covid) Anterior Nasal Swab     Status: None   Collection Time: 12/27/22  2:01 PM   Specimen: Anterior Nasal Swab  Result  Value Ref Range Status   SARS Coronavirus 2 by RT PCR NEGATIVE NEGATIVE Final    Comment: (NOTE) SARS-CoV-2 target nucleic acids are NOT DETECTED.  The SARS-CoV-2 RNA is generally detectable in upper respiratory specimens during the acute phase of infection. The lowest concentration of SARS-CoV-2 viral copies this assay can detect is 138 copies/mL. A negative result does not preclude SARS-Cov-2 infection and should not be used as the sole basis for treatment or other patient management decisions. A negative result may occur with  improper specimen collection/handling, submission of specimen other than nasopharyngeal swab, presence of viral mutation(s) within the areas targeted by this assay, and inadequate number of  viral copies(<138 copies/mL). A negative result must be combined with clinical observations, patient history, and epidemiological information. The expected result is Negative.  Fact Sheet for Patients:  EntrepreneurPulse.com.au  Fact Sheet for Healthcare Providers:  IncredibleEmployment.be  This test is no t yet approved or cleared by the [place] FDA and  has been authorized for detection and/or diagnosis of SARS-CoV-2 by FDA under an Emergency Use Authorization (EUA). This EUA will remain  in effect (meaning this test can be used) for the duration of the COVID-19 declaration under Section 564(b)(1) of the Act, 21 U.S.C.section 360bbb-3(b)(1), unless the authorization is terminated  or revoked sooner.       Influenza A by PCR NEGATIVE NEGATIVE Final   Influenza B by PCR NEGATIVE NEGATIVE Final    Comment: (NOTE) The Xpert Xpress SARS-CoV-2/FLU/RSV plus assay is intended as an aid in the diagnosis of influenza from Nasopharyngeal swab specimens and should not be used as a sole basis for treatment. Nasal washings and aspirates are unacceptable for Xpert Xpress SARS-CoV-2/FLU/RSV testing.  Fact Sheet for  Patients: EntrepreneurPulse.com.au  Fact Sheet for Healthcare Providers: IncredibleEmployment.be  This test is not yet approved or cleared by the [place] FDA and has been authorized for detection and/or diagnosis of SARS-CoV-2 by FDA under an Emergency Use Authorization (EUA). This EUA will remain in effect (meaning this test can be used) for the duration of the COVID-19 declaration under Section 564(b)(1) of the Act, 21 U.S.C. section 360bbb-3(b)(1), unless the authorization is terminated or revoked.     Resp Syncytial Virus by PCR NEGATIVE NEGATIVE Final    Comment: (NOTE) Fact Sheet for Patients: EntrepreneurPulse.com.au  Fact Sheet for Healthcare Providers: IncredibleEmployment.be  This test is not yet approved or cleared by the [place] FDA and has been authorized for detection and/or diagnosis of SARS-CoV-2 by FDA under an Emergency Use Authorization (EUA). This EUA will remain in effect (meaning this test can be used) for the duration of the COVID-19 declaration under Section 564(b)(1) of the Act, 21 U.S.C. section 360bbb-3(b)(1), unless the authorization is terminated or revoked.  Performed at [facility], [address] [place], [place] [address]   Gastrointestinal Panel by PCR , Stool     Status: None   Collection Time: 12/27/22  4:25 PM   Specimen: Stool  Result Value Ref Range Status   Campylobacter species NOT DETECTED NOT DETECTED Final   Plesimonas shigelloides NOT DETECTED NOT DETECTED Final   Salmonella species NOT DETECTED NOT DETECTED Final   Yersinia enterocolitica NOT DETECTED NOT DETECTED Final   Vibrio species NOT DETECTED NOT DETECTED Final   Vibrio cholerae NOT DETECTED NOT DETECTED Final   Enteroaggregative E coli (EAEC) NOT DETECTED NOT DETECTED Final   Enteropathogenic E coli (EPEC) NOT DETECTED NOT DETECTED Final   Enterotoxigenic E coli (ETEC) NOT DETECTED NOT  DETECTED Final   Shiga like toxin producing E coli (STEC) NOT DETECTED NOT DETECTED Final   Shigella/Enteroinvasive E coli (EIEC) NOT DETECTED NOT DETECTED Final   Cryptosporidium NOT DETECTED NOT DETECTED Final   Cyclospora cayetanensis NOT DETECTED NOT DETECTED Final   Entamoeba histolytica NOT DETECTED NOT DETECTED Final   Giardia lamblia NOT DETECTED NOT DETECTED Final   Adenovirus F40/41 NOT DETECTED NOT DETECTED Final   Astrovirus NOT DETECTED NOT DETECTED Final   Norovirus GI/GII NOT DETECTED NOT DETECTED Final   Rotavirus A NOT DETECTED NOT DETECTED Final   Sapovirus (I, II, IV, and V) NOT DETECTED NOT DETECTED Final  Comment: Performed at [facility], [address] [place], [place] [address]  C Difficile Quick Screen w PCR reflex     Status: None   Collection Time: 12/27/22  4:25 PM   Specimen: STOOL  Result Value Ref Range Status   C Diff antigen NEGATIVE NEGATIVE Final   C Diff toxin NEGATIVE NEGATIVE Final   C Diff interpretation No C. difficile detected.  Final    Comment: Performed at [facility], [address] [place], [place] [address]  Gram stain     Status: None   Collection Time: 12/29/22 11:15 AM   Specimen: Ascitic  Result Value Ref Range Status   Specimen Description ASCITIC  Final   Special Requests NONE  Final   Gram Stain   Final    NO ORGANISMS SEEN WBC PRESENT, PREDOMINANTLY MONONUCLEAR CYTOSPIN SMEAR Performed at [facility], [address], [place], [place] [address]    Report Status 12/29/2022 FINAL  Final  Culture, body fluid w Gram Stain-bottle     Status: None (Preliminary result)   Collection Time: 12/29/22 11:15 AM   Specimen: Ascitic  Result Value Ref Range Status   Specimen Description ASCITIC  Final   Special Requests   Final    BOTTLES DRAWN AEROBIC AND ANAEROBIC Blood Culture adequate volume   Culture   Final    NO GROWTH 2 DAYS Performed at [facility], [address] [place], [place] [address]    Report Status  PENDING  Incomplete    Labs: CBC: Recent Labs  Lab 12/27/22 1146 12/28/22 0520 12/29/22 0424 12/31/22 0430  WBC 5.6 3.9* 5.4 6.1  NEUTROABS 3.5  --   --   --   HGB 12.0* 10.1* 10.3* 8.8*  HCT 37.1* 32.2* 32.6* 27.6*  MCV 95.1 98.5 98.2 95.8  PLT 217 146* 162 [id number]*   Basic Metabolic Panel: Recent Labs  Lab 12/27/22 1146 12/28/22 0520 12/29/22 0424 12/30/22 0423 12/31/22 0430  NA 133* 134* 133* 129* 133*  K 2.8* 3.1* 2.7* 3.0* 4.1  CL 99 101 101 103 106  CO2 26 26 26 23 24   GLUCOSE 95 293* 292* 391* 185*  BUN <5* <5* 5* 5* 6  CREATININE 0.67 0.76 0.78 0.72 0.67  CALCIUM 7.6* 7.1* 7.1* 6.6* 7.0*  MG  --  1.4* 1.7 1.6* 1.7   Liver Function Tests: Recent Labs  Lab 12/27/22 1146 12/28/22 0520 12/29/22 1803 12/31/22 0430  AST 26 23 22 17   ALT 18 16 18 15   ALKPHOS 181* 143* 156* 109  BILITOT 2.1* 1.8* 1.8* 1.5*  PROT 6.3* 5.0* 5.4* 4.2*  ALBUMIN 2.4* 1.9* 2.0* 1.5*   CBG: Recent Labs  Lab 12/30/22 1154 12/30/22 1619 12/30/22 2022 12/31/22 0752 12/31/22 1111  GLUCAP 204* 168* 158* 272* 253*    Discharge time spent: greater than 30 minutes.  Signed: Tawni Millers, MD [facility] 12/31/2022

## 2022-12-31 NOTE — Assessment & Plan Note (Signed)
Continue pancreatic enzymes. Follow up as outpatient.  Avoid further alcohol consumption.

## 2023-01-03 LAB — CULTURE, BODY FLUID W GRAM STAIN -BOTTLE
Culture: NO GROWTH
Special Requests: ADEQUATE

## 2023-01-04 ENCOUNTER — Telehealth: Payer: Self-pay | Admitting: *Deleted

## 2023-01-04 NOTE — Telephone Encounter (Signed)
Transition Care Management Follow-up Telephone Call  Date discharged? 12/31/22  How have you been since you were released from the hospital? Cramping   Do you understand why you were in the hospital? yes  Do you understand the discharge instructions? yes  Where were you discharged to? home   Items Reviewed: Medications reviewed: yes Allergies reviewed: yes Dietary changes reviewed: yes Referrals reviewed: yes   Functional Questionnaire:  Activities of Daily Living (ADLs):   He states they are independent in the following:  putting socks on States they require assistance with the following: dressing  Any transportation issues/concerns?: no  Any patient concerns? Yes cramping in the legs  Confirmed importance and date/time of follow-up visits scheduled yes Provider Appointment booked with Dr Sarajane Jews 01/06/23 at 3 pm  Confirmed with patient if condition begins to worsen call PCP or go to the ER.  Patient was given the office number and encouraged to call back with question or concerns.  : yes

## 2023-01-04 NOTE — Telephone Encounter (Signed)
TCM 1st attempt to call the patient. Left message on machine for patient to return our call. 

## 2023-01-06 ENCOUNTER — Ambulatory Visit: Payer: Medicare Other | Admitting: Internal Medicine

## 2023-01-06 ENCOUNTER — Ambulatory Visit: Payer: Medicare Other | Admitting: Family Medicine

## 2023-01-06 NOTE — Progress Notes (Signed)
Chief Complaint  Patient presents with   Hospitalization Follow-up    Pt reports he went to hospital for swelling from his abdomen to feet. Currently, pt reports he is feeling slight lightheaded. Abdominal pain 7/10. His lasix was increased.    HPI: Todd Rodriguez 47 y.o. come in for  Last visit with me July 23  socned then uncontrolled dm  alcohol dependence presented with edema  in Jan to clinic Saw Dr Clent Ridges  rx diuretic . Had edema ascites  dx anasra and hosp 3 24   3 28    and placed on bid lasix and daily spironolactone Not better sohospitalized and iv diuresis and 4 l paracentesis  done with improvement in sx.  Pancreatic enzymes officially rxed   nutritional supplment folate  multivits thiamine b12  Rare use of ativan for panic none recently   Since dc  watching weight . Taking meds . Denies etoh at this time. Slates is eating normal food . DM Jardiance  ahs dexcom cgm says  in control has  tresiba  insuli sliding scale  Feels light headed at times . Staying  hydrated   Hospital  dc instructions below   Recommendations at discharge:     Patient will continue diuresis with furosemide and spironolactone Reduced K supplements to 10 meq daily Follow up renal function and electrolytes in 7 days.  Discontinue ACE inh and place on  metoprolol for blood pressure control.  Added pancreatic enzymes for pancreatic  insufficiency.  Follow up with Dr Clent Ridges in 7 to 10 days. Follow up with GI as outpatient.    Discharge Diagnoses: Principal Problem:   Liver cirrhosis (HCC) Active Problems:   Chronic pancreatitis (HCC)   Essential hypertension, benign   Type 2 diabetes mellitus with hyperlipidemia (HCC)   Alcohol abuse   GERD (gastroesophageal reflux disease) ROS: See pertinent positives and negatives per HPI. No current fever  abd pain  no bleeding   Past Medical History:  Diagnosis Date   Acute pancreatitis 10/25/2013   Arthritis    Diabetes mellitus without complication    DKA  (diabetic ketoacidosis) 06/30/2021   DKA, type 2 07/01/2021   GERD (gastroesophageal reflux disease)    Hepatic steatosis    History of pancreatitis    Hypertension    Myalgia and myositis 04/21/2014   much better from illness presumed infectious    has disability form  will complete     Pancreatic pseudocyst     Family History  Problem Relation Age of Onset   Diabetes Mother    Hypertension Mother    Diabetes Father    Hypertension Father    Arthritis Maternal Grandmother    Hypertension Maternal Grandfather    Diabetes Maternal Grandfather    Colon cancer Neg Hx    Pancreatitis Neg Hx    Pancreatic cancer Neg Hx     Social History   Socioeconomic History   Marital status: Married    Spouse name: Not on file   Number of children: 1   Years of education: Not on file   Highest education level: Not on file  Occupational History    Employer: [employer]  Tobacco Use   Smoking status: Every Day    Packs/day: 1.00    Years: 12.00    Additional pack years: 0.00    Total pack years: 12.00    Types: Cigarettes    Passive exposure: Current   Smokeless tobacco: Never  Vaping Use   Vaping Use: Never  used  Substance and Sexual Activity   Alcohol use: Not Currently   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             Social Determinants of Health   Financial Resource Strain: Low Risk  (06/15/2022)   Overall Financial Resource Strain (CARDIA)    Difficulty of Paying Living Expenses: Not hard at all  Food Insecurity: No Food Insecurity (06/15/2022)   Hunger Vital Sign    Worried About Running Out of  Food in the Last Year: Never true    Ran Out of Food in the Last Year: Never true  Transportation Needs: No Transportation Needs (06/15/2022)   PRAPARE - Administrator, Civil Service (Medical): No    Lack of Transportation (Non-Medical): No  Physical Activity: Inactive (06/15/2022)   Exercise Vital Sign    Days of Exercise per Week: 0 days    Minutes of Exercise per Session: 0 min  Stress: No Stress Concern Present (06/15/2022)   [employer] of Occupational Health - Occupational Stress Questionnaire    Feeling of Stress : Not at all  Social Connections: Socially Integrated (06/15/2022)   Social Connection and Isolation Panel [NHANES]    Frequency of Communication with Friends and Family: More than three times a week    Frequency of Social Gatherings with Friends and Family: More than three times a week    Attends Religious Services: More than 4 times per year    Active Member of [employer] or Organizations: Yes    Attends Engineer, structural: More than 4 times per year    Marital Status: Married    Outpatient Medications Prior to Visit  Medication Sig Dispense Refill   albuterol (VENTOLIN HFA) 108 (90 Base) MCG/ACT inhaler Inhale 2 puffs into the lungs every 4 (four) hours as needed for wheezing or shortness of breath. 18 g 1   atorvastatin (LIPITOR) 40 MG tablet Take 1 tablet (40 mg total) by mouth daily. 90 tablet 1   B COMPLEX-C-FOLIC ACID PO Take 0.8 mg by mouth daily.     BD PEN NEEDLE NANO 2ND GEN 32G X 4 MM MISC USE AS DIRECTED 100 each 0   Continuous Blood Gluc Sensor (DEXCOM G6 SENSOR) MISC 1 Device by Does not apply route as directed. 9 each 3   Continuous Blood Gluc Transmit (DEXCOM G6 TRANSMITTER) MISC 1 Device by Does not apply route as directed. 1 each 3   Cyanocobalamin (VITAMIN B-12 PO) Take 1 tablet by mouth daily.     empagliflozin (JARDIANCE) 25 MG TABS tablet Take 1 tablet (25 mg total) by mouth daily before breakfast. 90 tablet 0   folic acid  (FOLVITE) 1 MG tablet Take 1 tablet (1 mg total) by mouth daily. 90 tablet 3   furosemide (LASIX) 20 MG tablet Take 2 tablets (40 mg total) by mouth 2 (two) times daily. 120 tablet 0   gabapentin (NEURONTIN) 300 MG capsule Take 2 capsules (600 mg total)  by mouth 3 (three) times daily. 180 capsule 0   lipase/protease/amylase (CREON) 36000 UNITS CPEP capsule Take 1 capsule (36,000 Units total) by mouth 3 (three) times daily with meals. 90 capsule 0   LORazepam (ATIVAN) 1 MG tablet Take 1 tablet (1 mg total) by mouth every 8 (eight) hours as needed for anxiety. Panic attacks 20 tablet 1   metoprolol tartrate (LOPRESSOR) 25 MG tablet Take 0.5 tablets (12.5 mg total) by mouth 2 (two) times daily. 30 tablet 0   Multiple Vitamin (MULTIVITAMIN WITH MINERALS) TABS tablet Take 1 tablet by mouth daily.     Multiple Vitamins-Minerals (ONE-A-DAY MENS, MINERALS, PO) Take 1 tablet by mouth daily.     Nutritional Supplements (,FEEDING SUPPLEMENT, PROSOURCE PLUS) liquid Take 60 mLs by mouth 3 (three) times daily between meals. 5400 mL 0   pantoprazole (PROTONIX) 40 MG tablet TAKE 1 TABLET BY MOUTH EVERY DAY 90 tablet 1   potassium chloride SA (KLOR-CON M) 10 MEQ tablet Take 1 tablet (10 mEq total) by mouth daily. 30 tablet 0   spironolactone (ALDACTONE) 50 MG tablet Take 1 tablet (50 mg total) by mouth daily. 30 tablet 0   thiamine 100 MG tablet Take 1 tablet (100 mg total) by mouth daily. (Patient taking differently: Take 200 mg by mouth 3 (three) times daily.) 90 tablet 3   traZODone (DESYREL) 50 MG tablet Take 1 tablet (50 mg total) by mouth at bedtime. 90 tablet 0   TRESIBA FLEXTOUCH 100 UNIT/ML FlexTouch Pen Inject 20 Units into the skin daily. 15 mL 0   No facility-administered medications prior to visit.     EXAM:  BP 98/72 (BP Location: Right Arm, Patient Position: Sitting, Cuff Size: Large)   Pulse 92   Temp 98 F (36.7 C) (Oral)   Ht  (1.88 m)   Wt 168 lb 9.6 oz (76.5 kg)   SpO2 97%   BMI  21.65 kg/m   Body mass index is 21.65 kg/m. Wt Readings from Last 3 Encounters:  01/07/23 168 lb 9.6 oz (76.5 kg)  12/31/22 170 lb 10.2 oz (77.4 kg)  12/22/22 183 lb 6.4 oz (83.2 kg)    GENERAL: vitals reviewed and listed above, alert, oriented, appears well hydrated and in no acute distress  cane  decrease muscle mass non icteric  HEENT: atraumatic, conjunctiva  clear, no obvious abnormalities on inspection of external nose and ears  NECK: no obvious masses on inspection palpation  LUNGS: clear to auscultation bilaterally, no wheezes, rales or rhonchi,  CV: HRRR, no clubbing cyanosis nl cap refill   3+ edema legs up to knees  MS: moves all extremities without noticeable focal  abnormality( previous ue atrophy )  PSYCH: pleasant and cooperative,cognition seems normal grossly  Lab Results  Component Value Date   WBC 6.1 12/31/2022   HGB 8.8 (L) 12/31/2022   HCT 27.6 (L) 12/31/2022   PLT 116 (L) 12/31/2022   GLUCOSE 255 (H) 01/08/2023   CHOL 160 12/23/2020   TRIG 80.0 12/23/2020   HDL 69.60 12/23/2020   LDLDIRECT 193.0 03/21/2019   LDLCALC 74 12/23/2020   ALT 15 12/31/2022   AST 17 12/31/2022   NA 132 (L) 01/08/2023   K 4.3 01/08/2023   CL 100 01/08/2023   CREATININE 0.61 01/08/2023   BUN 3 (L) 01/08/2023   CO2 26 01/08/2023   TSH 2.42 12/22/2022   PSA 1.17 12/23/2020   INR 1.1 04/16/2022   HGBA1C 7.2 (H) 12/27/2022   MICROALBUR <0.7 10/18/2019  BP Readings from Last 3 Encounters:  01/07/23 98/72  12/31/22 119/75  12/22/22 118/82   Med review  ASSESSMENT AND PLAN:  Discussed the following assessment and plan:  Hospital discharge follow-up  Edema, unspecified type - 20 # dec from pre hopsitalization - Plan: Basic metabolic panel, Basic metabolic panel, CANCELED: Basic metabolic panel  Medication management - Plan: Basic metabolic panel, Basic metabolic panel, CANCELED: Basic metabolic panel  Alcoholic cirrhosis of liver with ascites  Pancreatic  insufficiency - Plan: Basic metabolic panel, Basic metabolic panel, CANCELED: Basic metabolic panel  Anasarca  Protein-calorie malnutrition, severe  Tobacco dependence  Type 2 diabetes mellitus with hyperlipidemia Plan continue meds  and follow weight  alcohol bs  boost nutrition. Told him frankly that drinking etoh will  "kill him"  has permanent damage to liver . Update lab today and for now keep "cpe appt for now"  April 17  -Patient advised to return or notify health care team  if  new concerns arise.  Patient Instructions  Lab today   Weight daily . If gain  too much weight .  3-5 days  contact . Medical team  NO alcohol   Neta MendsWanda K. Joceline Hinchcliff M.D.

## 2023-01-07 ENCOUNTER — Ambulatory Visit: Payer: Medicare Other | Admitting: Internal Medicine

## 2023-01-07 ENCOUNTER — Encounter: Payer: Self-pay | Admitting: Internal Medicine

## 2023-01-07 VITALS — BP 98/72 | HR 92 | Temp 98.0°F | Ht 74.0 in | Wt 168.6 lb

## 2023-01-07 DIAGNOSIS — K8689 Other specified diseases of pancreas: Secondary | ICD-10-CM

## 2023-01-07 DIAGNOSIS — R609 Edema, unspecified: Secondary | ICD-10-CM | POA: Diagnosis not present

## 2023-01-07 DIAGNOSIS — E43 Unspecified severe protein-calorie malnutrition: Secondary | ICD-10-CM

## 2023-01-07 DIAGNOSIS — Z79899 Other long term (current) drug therapy: Secondary | ICD-10-CM

## 2023-01-07 DIAGNOSIS — K7031 Alcoholic cirrhosis of liver with ascites: Secondary | ICD-10-CM | POA: Diagnosis not present

## 2023-01-07 DIAGNOSIS — E785 Hyperlipidemia, unspecified: Secondary | ICD-10-CM

## 2023-01-07 DIAGNOSIS — R601 Generalized edema: Secondary | ICD-10-CM

## 2023-01-07 DIAGNOSIS — E1169 Type 2 diabetes mellitus with other specified complication: Secondary | ICD-10-CM

## 2023-01-07 DIAGNOSIS — Z09 Encounter for follow-up examination after completed treatment for conditions other than malignant neoplasm: Secondary | ICD-10-CM

## 2023-01-07 DIAGNOSIS — F17200 Nicotine dependence, unspecified, uncomplicated: Secondary | ICD-10-CM

## 2023-01-07 NOTE — Patient Instructions (Signed)
Lab today   Weight daily . If gain  too much weight .  3-5 days  contact . Medical team  NO alcohol

## 2023-01-08 ENCOUNTER — Other Ambulatory Visit: Payer: Medicare Other

## 2023-01-09 ENCOUNTER — Encounter: Payer: Self-pay | Admitting: Internal Medicine

## 2023-01-09 LAB — SPECIMEN COMPROMISED

## 2023-01-09 LAB — BASIC METABOLIC PANEL
BUN/Creatinine Ratio: 5 (calc) — ABNORMAL LOW (ref 6–22)
BUN: 3 mg/dL — ABNORMAL LOW (ref 7–25)
CO2: 26 mmol/L (ref 20–32)
Calcium: 7.9 mg/dL — ABNORMAL LOW (ref 8.6–10.3)
Chloride: 100 mmol/L (ref 98–110)
Creat: 0.61 mg/dL (ref 0.60–1.29)
Glucose, Bld: 255 mg/dL — ABNORMAL HIGH (ref 65–99)
Potassium: 4.3 mmol/L (ref 3.5–5.3)
Sodium: 132 mmol/L — ABNORMAL LOW (ref 135–146)

## 2023-01-10 ENCOUNTER — Other Ambulatory Visit: Payer: Self-pay | Admitting: Family Medicine

## 2023-01-11 NOTE — Progress Notes (Signed)
Potassium is in range   continue   and keep next appt  for CPE and will follo wup

## 2023-01-12 ENCOUNTER — Telehealth: Payer: Self-pay

## 2023-01-12 NOTE — Progress Notes (Signed)
   Care Guide Note  01/12/2023 Name: Todd Rodriguez MRN: [id number] DOB: 23-Aug-1976  Referred by: Madelin Headings, MD Reason for referral : Care Coordination (Outreach to schedule with Pharm d )   Avimael Griscom is a 47 y.o. year old male who is a primary care patient of Panosh, Neta Mends, MD. Anvit Pasqual was referred to the pharmacist for assistance related to DM.    Successful contact was made with the patient to discuss pharmacy services including being ready for the pharmacist to call at least 5 minutes before the scheduled appointment time, to have medication bottles and any blood sugar or blood pressure readings ready for review. The patient agreed to meet with the pharmacist via with the pharmacist via telephone visit on (date/time).  01/14/2023  Penne Lash, RMA Care Guide [facility]  [place], [place] [address] Direct Dial: [phone] [email or web address]

## 2023-01-13 ENCOUNTER — Ambulatory Visit (INDEPENDENT_AMBULATORY_CARE_PROVIDER_SITE_OTHER): Payer: Medicare Other | Admitting: Internal Medicine

## 2023-01-13 ENCOUNTER — Encounter: Payer: Self-pay | Admitting: Internal Medicine

## 2023-01-13 VITALS — BP 129/94 | HR 97 | Temp 97.1°F | Ht 74.0 in | Wt 158.7 lb

## 2023-01-13 DIAGNOSIS — K7031 Alcoholic cirrhosis of liver with ascites: Secondary | ICD-10-CM

## 2023-01-13 DIAGNOSIS — R634 Abnormal weight loss: Secondary | ICD-10-CM | POA: Diagnosis not present

## 2023-01-13 DIAGNOSIS — R197 Diarrhea, unspecified: Secondary | ICD-10-CM

## 2023-01-13 MED ORDER — DIPHENOXYLATE-ATROPINE 2.5-0.025 MG PO TABS: 1.0000 | ORAL_TABLET | Freq: Three times a day (TID) | ORAL | 1 refills | Status: DC | PRN

## 2023-01-13 NOTE — Progress Notes (Signed)
Referring Provider: Madelin Headings, MD Primary Care Physician:  Madelin Headings, MD Primary GI:  Dr. Marletta Lor  Chief Complaint  Patient presents with   Watery stools    Patient here today due to having issues recently with watery stools, symptoms ongoing for a while now per patient. Todd Rodriguez says Todd Rodriguez takes imodium and this does not help at all. Patient says after eating Todd Rodriguez immediately has to run to the rest room. Patient says Todd Rodriguez has no energy.    HPI:   Todd Rodriguez is a 47 y.o. male who presents  today for hospital follow-up visit.  Has a history of chronic GERD, weight loss, rectal bleeding, pancreatic pseudocyst, chronic alcohol abuse, previously severe fatty liver disease now with evidence of decompensated cirrhosis.    Recently admitted to [facility] for anasarca.  During his workup found to have evidence of decompensated cirrhosis with ascites.  Underwent paracentesis with 4 L of fluid removed.  States Todd Rodriguez was drinking up until about 5 weeks ago.  Was drinking 5-6 drinks a day on average.  Has a history of pancreatic pseudocyst which has been followed with MRIs, most recent MRI January 2022 which showed no significant change, recommended against further surveillance imaging as this has been stable.  CT abdomen pelvis with contrast 04/16/2022 showed cyst is actually slightly smaller.  Evidence of chronic calcification/chronic pancreatitis.  In regards to his hypervolemia, currently on Lasix 40 mg twice daily, spironolactone 50 mg daily.  States Todd Rodriguez is down 20 pounds from his hospitalization.  Denies any abdominal distention.  States Todd Rodriguez feels better from this point though continues to be fatigued.  Main complaint for me today is ongoing diarrhea.  States Todd Rodriguez will have up to 10 loose/watery stools a day.  No abdominal pain associated with this.  This has been going on for a year Todd Rodriguez states.  Previously on Creon which did not help.  Tried Imodium which did not help.  In the hospital Todd Rodriguez was screened  for C. difficile which was negative.  GI pathogen panel negative.  Procedure history: EGD 04/17/2022 evidence of gastropathy otherwise unremarkable.  EGD 09/23/2020 irregular Z-line, gastritis, normal duodenum.  EGD and colonoscopy in 05/13/20 moderately severe esophagitis with no bleeding found in the middle third of the esophagus, suspicious for Candida esophagitis.  KOH was negative.  Todd Rodriguez was treated with 2 weeks of Diflucan.  GE junction biopsy showed GERD with mild inflammation consistent with reflux. Duodenal biopsies benign.   Colonoscopy 05/13/2020 hyperplastic and inflammatory polyp removed.  Colon biopsies negative.  Past Medical History:  Diagnosis Date   Acute pancreatitis 10/25/2013   Arthritis    Diabetes mellitus without complication    DKA (diabetic ketoacidosis) 06/30/2021   DKA, type 2 07/01/2021   GERD (gastroesophageal reflux disease)    Hepatic steatosis    History of pancreatitis    Hypertension    Myalgia and myositis 04/21/2014   much better from illness presumed infectious    has disability form  will complete     Pancreatic pseudocyst     Past Surgical History:  Procedure Laterality Date   ANTERIOR CERVICAL DECOMP/DISCECTOMY FUSION N/A 01/12/2018   Procedure: Revision ACDF C4-5, removal of hardware C5-6, exploration of fusion C5-6;  Surgeon: Venita Lick, MD;  Location: [facility] OR;  Service: Orthopedics;  Laterality: N/A;  3.5 hrs   BIOPSY  05/13/2020   Procedure: BIOPSY;  Surgeon: Lanelle Bal, DO;  Location: [facility];  Service: Endoscopy;;  BIOPSY  09/23/2020   Procedure: BIOPSY;  Surgeon: Lanelle Bal, DO;  Location: [facility];  Service: Endoscopy;;   COLONOSCOPY WITH PROPOFOL N/A 05/13/2020   Dr. Marletta Lor: Nonbleeding internal hemorrhoids.  Few small mouth diverticula in the sigmoid colon.  2 cecal polyps, inflammatory.  Sigmoid colon polyp hyperplastic.  Localized area of granular mucosa in the transverse colon, status post biopsy which was  unremarkable.  No microscopic colitis.  Plans for repeat colonoscopy in 5 years.   ESOPHAGEAL BRUSHING  05/13/2020   Procedure: ESOPHAGEAL BRUSHING;  Surgeon: Lanelle Bal, DO;  Location: [facility];  Service: Endoscopy;;  mid esophagus   ESOPHAGOGASTRODUODENOSCOPY (EGD) WITH PROPOFOL N/A 05/13/2020   Dr. Marletta Lor: Moderately severe esophagitis with no bleeding found in the middle third of the esophagus, suspicious for Candida esophagitis.  KOH was negative. Gastritis, duodenal polyp.  Duodenal biopsy benign.  Duodenal bulb biopsy with Brunner's gland hyperplasia and mild changes suggestive of peptic injury.  No celiac disease seen.  GE junction biopsy showed GERD with mild inflammation consisten   ESOPHAGOGASTRODUODENOSCOPY (EGD) WITH PROPOFOL N/A 09/23/2020   Procedure: ESOPHAGOGASTRODUODENOSCOPY (EGD) WITH PROPOFOL;  Surgeon: Lanelle Bal, DO;  Location: [facility];  Service: Endoscopy;  Laterality: N/A;  7:30am   ESOPHAGOGASTRODUODENOSCOPY (EGD) WITH PROPOFOL N/A 04/17/2022   Procedure: ESOPHAGOGASTRODUODENOSCOPY (EGD) WITH PROPOFOL;  Surgeon: Corbin Ade, MD;  Location: [facility];  Service: Endoscopy;  Laterality: N/A;   NECK SURGERY     2 disc   POLYPECTOMY  05/13/2020   Procedure: POLYPECTOMY;  Surgeon: Lanelle Bal, DO;  Location: [facility];  Service: Endoscopy;;   SPINAL FUSION      Current Outpatient Medications  Medication Sig Dispense Refill   albuterol (VENTOLIN HFA) 108 (90 Base) MCG/ACT inhaler Inhale 2 puffs into the lungs every 4 (four) hours as needed for wheezing or shortness of breath. 18 g 1   atorvastatin (LIPITOR) 40 MG tablet Take 1 tablet (40 mg total) by mouth daily. 90 tablet 1   B COMPLEX-C-FOLIC ACID PO Take 0.8 mg by mouth daily.     BD PEN NEEDLE NANO 2ND GEN 32G X 4 MM MISC USE AS DIRECTED 100 each 0   benazepril (LOTENSIN) 10 MG tablet TAKE 1 TABLET BY MOUTH EVERY DAY 90 tablet 0   Continuous Blood Gluc Sensor (DEXCOM G6 SENSOR) MISC 1  Device by Does not apply route as directed. 9 each 3   Continuous Blood Gluc Transmit (DEXCOM G6 TRANSMITTER) MISC 1 Device by Does not apply route as directed. 1 each 3   Cyanocobalamin (VITAMIN B-12 PO) Take 1 tablet by mouth daily.     empagliflozin (JARDIANCE) 25 MG TABS tablet Take 1 tablet (25 mg total) by mouth daily before breakfast. 90 tablet 0   folic acid (FOLVITE) 1 MG tablet Take 1 tablet (1 mg total) by mouth daily. 90 tablet 3   furosemide (LASIX) 20 MG tablet Take 2 tablets (40 mg total) by mouth 2 (two) times daily. 120 tablet 0   gabapentin (NEURONTIN) 300 MG capsule Take 2 capsules (600 mg total) by mouth 3 (three) times daily. 180 capsule 0   lipase/protease/amylase (CREON) 36000 UNITS CPEP capsule Take 1 capsule (36,000 Units total) by mouth 3 (three) times daily with meals. 90 capsule 0   LORazepam (ATIVAN) 1 MG tablet Take 1 tablet (1 mg total) by mouth every 8 (eight) hours as needed for anxiety. Panic attacks 20 tablet 1   metoprolol tartrate (  LOPRESSOR) 25 MG tablet Take 0.5 tablets (12.5 mg total) by mouth 2 (two) times daily. 30 tablet 0   Multiple Vitamins-Minerals (ONE-A-DAY MENS, MINERALS, PO) Take 1 tablet by mouth daily.     Nutritional Supplements (,FEEDING SUPPLEMENT, PROSOURCE PLUS) liquid Take 60 mLs by mouth 3 (three) times daily between meals. 5400 mL 0   pantoprazole (PROTONIX) 40 MG tablet TAKE 1 TABLET BY MOUTH EVERY DAY 90 tablet 1   potassium chloride SA (KLOR-CON M) 10 MEQ tablet Take 1 tablet (10 mEq total) by mouth daily. 30 tablet 0   spironolactone (ALDACTONE) 50 MG tablet Take 1 tablet (50 mg total) by mouth daily. 30 tablet 0   thiamine 100 MG tablet Take 1 tablet (100 mg total) by mouth daily. (Patient taking differently: Take 200 mg by mouth 3 (three) times daily.) 90 tablet 3   traZODone (DESYREL) 50 MG tablet TAKE 1 TABLET BY MOUTH EVERYDAY AT BEDTIME 90 tablet 0   TRESIBA FLEXTOUCH 100 UNIT/ML FlexTouch Pen Inject 20 Units into the skin daily.  (Patient taking differently: Inject 30 Units into the skin daily.) 15 mL 0   No current facility-administered medications for this visit.    Allergies as of 01/13/2023   (No Known Allergies)    Family History  Problem Relation Age of Onset   Diabetes Mother    Hypertension Mother    Diabetes Father    Hypertension Father    Arthritis Maternal Grandmother    Hypertension Maternal Grandfather    Diabetes Maternal Grandfather    Colon cancer Neg Hx    Pancreatitis Neg Hx    Pancreatic cancer Neg Hx     Social History   Socioeconomic History   Marital status: Married    Spouse name: Not on file   Number of children: 1   Years of education: Not on file   Highest education level: Not on file  Occupational History    Employer: [employer]  Tobacco Use   Smoking status: Every Day    Packs/day: 1.00    Years: 12.00    Additional pack years: 0.00    Total pack years: 12.00    Types: Cigarettes    Passive exposure: Current   Smokeless tobacco: Never  Vaping Use   Vaping Use: Never used  Substance and Sexual Activity   Alcohol use: Not Currently   Drug use: No   Sexual activity: Yes    Partners: Female    Comment: Patient's Wife  Other Topics Concern   Not on file  Social History Narrative   5 hours of sleep    2 people living in the home   A dog living in the home   [employer] hs education tirerunner  [employer]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   Eye exam nany clark    FA tobacco some etoh neg rd exercises   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             Social Determinants of Health   Financial Resource Strain: Low Risk  (06/15/2022)   Overall Financial Resource Strain (CARDIA)    Difficulty of Paying Living Expenses: Not hard at all  Food Insecurity: No Food Insecurity (06/15/2022)   Hunger Vital Sign    Worried About Running Out of Food in the Last Year: Never true    Ran Out of Food in the Last Year: Never true  Transportation Needs: No Transportation  Needs (06/15/2022)   PRAPARE - Transportation  Lack of Transportation (Medical): No    Lack of Transportation (Non-Medical): No  Physical Activity: Inactive (06/15/2022)   Exercise Vital Sign    Days of Exercise per Week: 0 days    Minutes of Exercise per Session: 0 min  Stress: No Stress Concern Present (06/15/2022)   [employer] of Occupational Health - Occupational Stress Questionnaire    Feeling of Stress : Not at all  Social Connections: Socially Integrated (06/15/2022)   Social Connection and Isolation Panel [NHANES]    Frequency of Communication with Friends and Family: More than three times a week    Frequency of Social Gatherings with Friends and Family: More than three times a week    Attends Religious Services: More than 4 times per year    Active Member of [employer] or Organizations: Yes    Attends Engineer, structural: More than 4 times per year    Marital Status: Married    Subjective: Review of Systems  Constitutional:  Positive for weight loss. Negative for chills and fever.  HENT:  Negative for congestion and hearing loss.   Eyes:  Negative for blurred vision and double vision.  Respiratory:  Negative for cough and shortness of breath.   Cardiovascular:  Negative for chest pain and palpitations.  Gastrointestinal:  Positive for diarrhea. Negative for abdominal pain, blood in stool, constipation, heartburn, melena and vomiting.  Genitourinary:  Negative for dysuria and urgency.  Musculoskeletal:  Negative for joint pain and myalgias.  Skin:  Negative for itching and rash.  Neurological:  Negative for dizziness and headaches.  Psychiatric/Behavioral:  Negative for depression. The patient is not nervous/anxious.      Objective: BP (!) 129/94 (BP Location: Left Arm, Patient Position: Sitting, Cuff Size: Normal)   Pulse 97   Temp (!) 97.1 F (36.2 C) (Temporal)   Ht 6\' 2"  (1.88 m)   Wt 158 lb 11.2 oz (72 kg)   BMI 20.38 kg/m  Physical  Exam Constitutional:      Comments: Cachectic, in wheelchair  HENT:     Head: Normocephalic and atraumatic.  Eyes:     Extraocular Movements: Extraocular movements intact.     Conjunctiva/sclera: Conjunctivae normal.  Cardiovascular:     Rate and Rhythm: Normal rate and regular rhythm.  Pulmonary:     Effort: Pulmonary effort is normal.     Breath sounds: Normal breath sounds.  Abdominal:     General: Bowel sounds are normal.     Palpations: Abdomen is soft.  Musculoskeletal:        General: Normal range of motion.     Cervical back: Normal range of motion and neck supple.  Skin:    General: Skin is warm.  Neurological:     General: No focal deficit present.     Mental Status: Todd Rodriguez is alert and oriented to person, place, and time.  Psychiatric:        Mood and Affect: Mood normal.        Behavior: Behavior normal.      Assessment: *Decompensated alcoholic cirrhosis-new diagnosis *Portal hypertension with ascites *GERD-well-controlled on pantoprazole *Pancreatic pseudocyst-stable, no further imaging recommended *Diarrhea-chronic, worsening *Chronic alcohol abuse-sober for 5 weeks *Weight loss    Plan: Discussed cirrhosis in depth with patient and his wife today.  New diagnosis for patient, evidence of decompensation with portal hypertension and ascites.  Will check baseline MELD labs today.  Ultrasound due for hepatoma screening 06/25/2023.  Needs repeat EGD given recent decompensation for variceal screening.  Will schedule on follow-up visit.  Continue on Lasix and Aldactone.  May make adjustments pending clinical course.   Recommended 2 g low-sodium diet, needs to increase protein consumption.  Discussed hepatic encephalopathy with patient as well as warning signs for his wife to look out for.  Absolute alcohol cessation going forward.  Patient seems to understand this.  Continue to monitor.  May benefit from referral to transplant hepatology pending clinical  course.  Etiology of his chronic diarrhea unclear.  No improvement on Creon.  Okay to discontinue this medication.  Recent C. difficile and GI pathogen panel negative.  Imodium not helping.  Will send in Lomotil and to take as needed.  GERD well-controlled on pantoprazole, will continue.  No further imaging recommended for pancreatic pseudocyst.  Follow-up in 4 to 6 weeks.  01/13/2023 3:58 PM   Disclaimer: This note was dictated with voice recognition software. Similar sounding words can inadvertently be transcribed and may not be corrected upon review.

## 2023-01-13 NOTE — Patient Instructions (Signed)
Continue on Lasix and Aldactone for your extra fluid.  Recommend 2 g low-salt diet.  Increase protein in your diet including protein snack before bedtime.  If your abdomen starts filling back up with fluid let us know and I will order paracentesis.  You will need upper endoscopy for variceal screening.  We will discuss this on follow-up visit.  I am going to check a baseline MELD score today at [facility].  We will call with results.  Continue to avoid alcohol.  Follow-up in 4 to 6 weeks.  It was very nice seeing you today.  Dr. Marletta Lor

## 2023-01-13 NOTE — Telephone Encounter (Signed)
Can refill the trazodone but the lotensin was dced at hospital.  Pleae update the  med list?

## 2023-01-14 ENCOUNTER — Other Ambulatory Visit: Payer: Medicare Other

## 2023-01-14 NOTE — Progress Notes (Signed)
01/14/2023 Name: Todd Rodriguez MRN: [id number] DOB: Apr 05, 1976  Chief Complaint  Patient presents with   Medication Management   Todd Rodriguez is a 47 y.o. year old male who presented for a telephone visit.   They were referred to the pharmacist by their PCP for assistance in managing  medication adherence .   Patient is participating in a Managed Medicaid Plan:  No  Subjective: Telephone visit to discuss medication management and adherence, specific to Type 2 Diabetes and other chronic disease staes.  Care Team: Primary Care Provider: Madelin Headings, MD ; Next Scheduled Visit: 01/19/23 Medication Access/Adherence Patient reports affordability concerns with their medications: No  Patient reports access/transportation concerns to their pharmacy: No  Patient reports adherence concerns with their medications:  No    Diabetes: Current medications: Jardiance 25mg  daily, Tresiba 30 units in the morning  -Current glucose readings: 340 yesterday afternoon <1h post-prandial -Using Dexcom CGM -Patient reports hypoglycemic s/sx including dizziness, shakiness, sweating.  States this happens frequently and usually middle of the night/very early morning.  Dexcom will alert and wake him up, prompting him to get a snack or juice.  Hypertension: Current medications: Benazepril 10mg  daily, Furosemide 40mg  BID, Metoprolol 12.5mg  BID, Spironolactone 50mg  daily -Patient has a validated, automated, upper arm home BP cuff -Current blood pressure readings readings: 130/97 -Endorses continued improvement of fluid retention, with none appearing in feat, ankles, or abdomen -States he will need refills on metoprolol, spironolactone, and furosemide  Hyperlipidemia/ASCVD Risk Reduction Current lipid lowering medications: Atorvastatin 40mg  daily -05/09/22 Lipid Panel:  TC 188, HDL 141, LDL 32, TG 73  Medication Management: Current adherence strategy: does not use daily pill organizer -Patient reports  Good adherence to medications -Patient reports no barriers, such as access or affordability, to adherence  Objective: Lab Results  Component Value Date   HGBA1C 7.2 (H) 12/27/2022   Lab Results  Component Value Date   CREATININE 0.61 01/08/2023   BUN 3 (L) 01/08/2023   NA 132 (L) 01/08/2023   K 4.3 01/08/2023   CL 100 01/08/2023   CO2 26 01/08/2023   Medications Reviewed Today     Reviewed by Lenna Gilford, RPH (Pharmacist) on 01/14/23 at 1039  Med List Status: <None>   Medication Order Taking? Sig Documenting Provider Last Dose Status Informant  albuterol (VENTOLIN HFA) 108 (90 Base) MCG/ACT inhaler [id number] Yes Inhale 2 puffs into the lungs every 4 (four) hours as needed for wheezing or shortness of breath. Nelwyn Salisbury, MD Taking Active Self, Pharmacy Records  atorvastatin (LIPITOR) 40 MG tablet [id number] Yes Take 1 tablet (40 mg total) by mouth daily. Shon Hale, MD Taking Active Self, Pharmacy Records  B COMPLEX-C-FOLIC ACID PO [id number] Yes Take 0.8 mg by mouth daily. [provider] Taking Active Self, Pharmacy Records  BD PEN NEEDLE NANO 2ND GEN 32G X 4 MM MISC [id number] Yes USE AS DIRECTED Nelwyn Salisbury, MD Taking Active Self, Pharmacy Records  benazepril (LOTENSIN) 10 MG tablet [id number] Yes TAKE 1 TABLET BY MOUTH EVERY DAY Worthy Rancher B, FNP Taking Active   Continuous Blood Gluc Sensor (DEXCOM G6 SENSOR) MISC [id number] Yes 1 Device by Does not apply route as directed. Shon Hale, MD Taking Active Self, Pharmacy Records  Continuous Blood Gluc Transmit (DEXCOM G6 TRANSMITTER) MISC [id number] Yes 1 Device by Does not apply route as directed. Shon Hale, MD Taking Active Self, Pharmacy Records  Cyanocobalamin (VITAMIN B-12 PO) [id number] No Take 1 tablet by mouth daily. [provider]  Unknown Active Self, Pharmacy Records  diphenoxylate-atropine (LOMOTIL) 2.5-0.025 MG tablet [id number] Yes Take 1 tablet by mouth 3 (three) times daily as  needed for diarrhea or loose stools. Lanelle Bal, DO Taking Active   empagliflozin (JARDIANCE) 25 MG TABS tablet [id number] Yes Take 1 tablet (25 mg total) by mouth daily before breakfast. Nelwyn Salisbury, MD Taking Active Self, Pharmacy Records  folic acid (FOLVITE) 1 MG tablet [id number] Yes Take 1 tablet (1 mg total) by mouth daily. Panosh, Neta Mends, MD Taking Active Self, Pharmacy Records  furosemide (LASIX) 20 MG tablet [id number] Yes Take 2 tablets (40 mg total) by mouth 2 (two) times daily. Arrien, York Ram, MD Taking Active   gabapentin (NEURONTIN) 300 MG capsule [id number] Yes Take 2 capsules (600 mg total) by mouth 3 (three) times daily. Nelwyn Salisbury, MD Taking Active Self, Pharmacy Records  LORazepam (ATIVAN) 1 MG tablet [id number] Yes Take 1 tablet (1 mg total) by mouth every 8 (eight) hours as needed for anxiety. Panic attacks Panosh, Neta Mends, MD Taking Active Self, Pharmacy Records  metoprolol tartrate (LOPRESSOR) 25 MG tablet [id number] Yes Take 0.5 tablets (12.5 mg total) by mouth 2 (two) times daily. Arrien, York Ram, MD Taking Active   Multiple Vitamins-Minerals (ONE-A-DAY MENS, MINERALS, PO) [id number] Yes Take 1 tablet by mouth daily. [provider] Taking Active Self, Pharmacy Records  Nutritional Supplements (,FEEDING SUPPLEMENT, PROSOURCE PLUS) liquid [id number] No Take 60 mLs by mouth 3 (three) times daily between meals. Arrien, York Ram, MD Unknown Active   pantoprazole (PROTONIX) 40 MG tablet [id number] Yes TAKE 1 TABLET BY MOUTH EVERY DAY Lanelle Bal, DO Taking Active Self, Pharmacy Records  potassium chloride SA (KLOR-CON M) 10 MEQ tablet [id number] Yes Take 1 tablet (10 mEq total) by mouth daily. Arrien, York Ram, MD Taking Active   spironolactone (ALDACTONE) 50 MG tablet [id number] Yes Take 1 tablet (50 mg total) by mouth daily. Arrien, York Ram, MD Taking Active   thiamine 100 MG tablet [id number] Yes Take 1 tablet (100 mg total)  by mouth daily.  Patient taking differently: Take 200 mg by mouth 3 (three) times daily.   Panosh, Neta Mends, MD Taking Active Self, Pharmacy Records  traZODone (DESYREL) 50 MG tablet [id number] Yes TAKE 1 TABLET BY MOUTH EVERYDAY AT BEDTIME Worthy Rancher B, FNP Taking Active   TRESIBA FLEXTOUCH 100 UNIT/ML FlexTouch Pen [id number] Yes Inject 20 Units into the skin daily.  Patient taking differently: Inject 30 Units into the skin daily.   Arrien, York Ram, MD Taking Active            Assessment/Plan:   Diabetes: - Currently uncontrolled - Reviewed goal A1c, goal fasting, and goal 2 hour post prandial glucose - Recommend to try taking Tresiba dose before bedtime; medication should not peak, but can see if this will alleviate nocturnal hypoclycemia and help with afternoon hyperglycemia  - Recommend to check glucose continuously with CGM -Previously followed by Dr. Lonzo Cloud; appointment with endocrinology would be beneficial -Recommend A1c and CMP in 7July  Hypertension: - Currently uncontrolled - Recommend to check BP daily and record -If consistently >130/80, increase benazepril to 20mg  daily   Hyperlipidemia/ASCVD Risk Reduction: - Currently controlled.  - Recommend to continue current regimen- indicated even with recent decompensated cirrhosis diagnosis  Medication Management: - Currently strategy sufficient to maintain appropriate adherence to prescribed medication regimen - Suggested use of weekly pill box to organize medications - Discussed collaboration with local pharmacies for adherence packaging. Reviewed local  pharmacies with adherence packaging options. Patient elects to continue current strategy.  Follow Up Plan: Follow-up with PCP scheduled 4/16; I will follow-up per Dr. Rosezella FloridaPanosh's recommendations  Lenna Gilfordheryl A Odysseus Cada, PharmD, DPLA

## 2023-01-18 ENCOUNTER — Other Ambulatory Visit: Payer: Self-pay | Admitting: Family Medicine

## 2023-01-19 ENCOUNTER — Ambulatory Visit (INDEPENDENT_AMBULATORY_CARE_PROVIDER_SITE_OTHER): Payer: Medicare Other | Admitting: Internal Medicine

## 2023-01-19 ENCOUNTER — Encounter: Payer: Self-pay | Admitting: Internal Medicine

## 2023-01-19 VITALS — BP 112/78 | Temp 97.8°F | Ht 73.0 in | Wt 152.8 lb

## 2023-01-19 DIAGNOSIS — E43 Unspecified severe protein-calorie malnutrition: Secondary | ICD-10-CM | POA: Diagnosis not present

## 2023-01-19 DIAGNOSIS — Z0000 Encounter for general adult medical examination without abnormal findings: Secondary | ICD-10-CM

## 2023-01-19 DIAGNOSIS — K7031 Alcoholic cirrhosis of liver with ascites: Secondary | ICD-10-CM

## 2023-01-19 DIAGNOSIS — F17200 Nicotine dependence, unspecified, uncomplicated: Secondary | ICD-10-CM

## 2023-01-19 DIAGNOSIS — E1169 Type 2 diabetes mellitus with other specified complication: Secondary | ICD-10-CM | POA: Diagnosis not present

## 2023-01-19 DIAGNOSIS — E785 Hyperlipidemia, unspecified: Secondary | ICD-10-CM

## 2023-01-19 DIAGNOSIS — Z79899 Other long term (current) drug therapy: Secondary | ICD-10-CM

## 2023-01-19 DIAGNOSIS — Z7984 Long term (current) use of oral hypoglycemic drugs: Secondary | ICD-10-CM

## 2023-01-19 NOTE — Progress Notes (Signed)
Chief Complaint  Patient presents with   Annual Exam    HPI: Patient  Todd Rodriguez  47 y.o. comes in today for Preventive Health Care visit  He is continue to abstain from alcohol because his hospitalization and effect of alcohol "scared him to death". He is kept his appointment is now told to increase nutritional intake boost 3 times a day. States that his vision is not very clear he has had cataract surgery has an eye check soon. No vomiting some loose stools the GI doctor has changed stopping the pancreas enzymes and getting Lomotil and to have updated lab test done today.  No active bleeding or vomiting has not swollen up again is still losing some weight would like to gain some healthy weight. States his diabetes blood sugar is "up-and-down". When asked if he has a counselor to talk to he states that his wife.   Health Maintenance  Topic Date Due   COVID-19 Vaccine (1) Never done   DTaP/Tdap/Td (1 - Tdap) Never done   OPHTHALMOLOGY EXAM  12/04/2022   INFLUENZA VACCINE  05/06/2023   Medicare Annual Wellness (AWV)  06/16/2023   HEMOGLOBIN A1C  06/29/2023   Diabetic kidney evaluation - Urine ACR  12/27/2023   Diabetic kidney evaluation - eGFR measurement  01/08/2024   FOOT EXAM  01/19/2024   COLONOSCOPY (Pts 45-78yrs Insurance coverage will need to be confirmed)  05/13/2030   Hepatitis C Screening  Completed   HIV Screening  Completed   HPV VACCINES  Aged Out   Health Maintenance Review LIFESTYLE:  Exercise: Limited but more Tobacco/ETS: Continuing daily Alcohol: Currently abstinent continue Sugar beverages: No Sleep: Doing better Drug use: no HH of 2 pet corgi Work: Disabled wife works    ROS:  REST of 12 system review negative except as per HPI   Past Medical History:  Diagnosis Date   Acute pancreatitis 10/25/2013   Arthritis    Diabetes mellitus without complication    DKA (diabetic ketoacidosis) 06/30/2021   DKA, type 2 07/01/2021   GERD  (gastroesophageal reflux disease)    Hepatic steatosis    History of pancreatitis    Hypertension    Myalgia and myositis 04/21/2014   much better from illness presumed infectious    has disability form  will complete     Pancreatic pseudocyst     Past Surgical History:  Procedure Laterality Date   ANTERIOR CERVICAL DECOMP/DISCECTOMY FUSION N/A 01/12/2018   Procedure: Revision ACDF C4-5, removal of hardware C5-6, exploration of fusion C5-6;  Surgeon: Venita Lick, MD;  Location: [facility];  Service: Orthopedics;  Laterality: N/A;  3.5 hrs   BIOPSY  05/13/2020   Procedure: BIOPSY;  Surgeon: Lanelle Bal, DO;  Location: [facility];  Service: Endoscopy;;   BIOPSY  09/23/2020   Procedure: BIOPSY;  Surgeon: Lanelle Bal, DO;  Location: [facility];  Service: Endoscopy;;   COLONOSCOPY WITH PROPOFOL N/A 05/13/2020   Dr. Marletta Lor: Nonbleeding internal hemorrhoids.  Few small mouth diverticula in the sigmoid colon.  2 cecal polyps, inflammatory.  Sigmoid colon polyp hyperplastic.  Localized area of granular mucosa in the transverse colon, status post biopsy which was unremarkable.  No microscopic colitis.  Plans for repeat colonoscopy in 5 years.   ESOPHAGEAL BRUSHING  05/13/2020   Procedure: ESOPHAGEAL BRUSHING;  Surgeon: Lanelle Bal, DO;  Location: [facility];  Service: Endoscopy;;  mid esophagus   ESOPHAGOGASTRODUODENOSCOPY (EGD) WITH PROPOFOL N/A 05/13/2020   Dr. Marletta Lor:  Moderately severe esophagitis with no bleeding found in the middle third of the esophagus, suspicious for Candida esophagitis.  KOH was negative. Gastritis, duodenal polyp.  Duodenal biopsy benign.  Duodenal bulb biopsy with Brunner's gland hyperplasia and mild changes suggestive of peptic injury.  No celiac disease seen.  GE junction biopsy showed GERD with mild inflammation consisten   ESOPHAGOGASTRODUODENOSCOPY (EGD) WITH PROPOFOL N/A 09/23/2020   Procedure: ESOPHAGOGASTRODUODENOSCOPY (EGD) WITH PROPOFOL;  Surgeon:  Lanelle Bal, DO;  Location: [facility];  Service: Endoscopy;  Laterality: N/A;  7:30am   ESOPHAGOGASTRODUODENOSCOPY (EGD) WITH PROPOFOL N/A 04/17/2022   Procedure: ESOPHAGOGASTRODUODENOSCOPY (EGD) WITH PROPOFOL;  Surgeon: Corbin Ade, MD;  [facility]: [facility];  Service: Endoscopy;  Laterality: N/A;   NECK SURGERY     2 disc   POLYPECTOMY  05/13/2020   Procedure: POLYPECTOMY;  Surgeon: Lanelle Bal, DO;  Location: [facility];  Service: Endoscopy;;   SPINAL FUSION      Family History  Problem Relation Age of Onset   Diabetes Mother    Hypertension Mother    Diabetes Father    Hypertension Father    Arthritis Maternal Grandmother    Hypertension Maternal Grandfather    Diabetes Maternal Grandfather    Colon cancer Neg Hx    Pancreatitis Neg Hx    Pancreatic cancer Neg Hx     Social History   Socioeconomic History   Marital status: Married    Spouse name: Not on file   Number of children: 1   Years of education: Not on file   Highest education level: Not on file  Occupational History    Employer: [employer]  Tobacco Use   Smoking status: Every Day    Packs/day: 1.00    Years: 12.00    Additional pack years: 0.00    Total pack years: 12.00    Types: Cigarettes    Passive exposure: Current   Smokeless tobacco: Never  Vaping Use   Vaping Use: Never used  Substance and Sexual Activity   Alcohol use: Not Currently   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             Social Determinants of Health   Financial Resource Strain: Low Risk  (06/15/2022)   Overall Financial Resource  Strain (CARDIA)    Difficulty of Paying Living Expenses: Not hard at all  Food Insecurity: No Food Insecurity (01/19/2023)   Hunger Vital Sign    Worried About Running Out of Food in the Last Year: Never true    Ran Out of Food in the Last Year: Never true  Transportation Needs: No Transportation Needs (01/19/2023)   PRAPARE - Administrator, Civil Service (Medical): No    Lack of Transportation (Non-Medical): No  Physical Activity: Sufficiently Active (01/19/2023)   Exercise Vital Sign    Days of Exercise per Week: 5 days    Minutes of Exercise per Session: 30 min  Stress: No Stress Concern  Present (01/19/2023)   [employer] of Occupational Health - Occupational Stress Questionnaire    Feeling of Stress : Not at all  Social Connections: Socially Integrated (06/15/2022)   Social Connection and Isolation Panel [NHANES]    Frequency of Communication with Friends and Family: More than three times a week    Frequency of Social Gatherings with Friends and Family: More than three times a week    Attends Religious Services: More than 4 times per year    Active Member of [employer] or Organizations: Yes    Attends Engineer, structural: More than 4 times per year    Marital Status: Married    Outpatient Medications Prior to Visit  Medication Sig Dispense Refill   albuterol (VENTOLIN HFA) 108 (90 Base) MCG/ACT inhaler Inhale 2 puffs into the lungs every 4 (four) hours as needed for wheezing or shortness of breath. 18 g 1   atorvastatin (LIPITOR) 40 MG tablet Take 1 tablet (40 mg total) by mouth daily. 90 tablet 1   B COMPLEX-C-FOLIC ACID PO Take 0.8 mg by mouth daily.     BD PEN NEEDLE NANO 2ND GEN 32G X 4 MM MISC USE AS DIRECTED 100 each 0   benazepril (LOTENSIN) 10 MG tablet TAKE 1 TABLET BY MOUTH EVERY DAY 90 tablet 0   Continuous Blood Gluc Sensor (DEXCOM G6 SENSOR) MISC 1 Device by Does not apply route as directed. 9 each 3   Continuous Blood Gluc Transmit (DEXCOM G6  TRANSMITTER) MISC 1 Device by Does not apply route as directed. 1 each 3   diphenoxylate-atropine (LOMOTIL) 2.5-0.025 MG tablet Take 1 tablet by mouth 3 (three) times daily as needed for diarrhea or loose stools. 30 tablet 1   empagliflozin (JARDIANCE) 25 MG TABS tablet Take 1 tablet (25 mg total) by mouth daily before breakfast. 90 tablet 0   folic acid (FOLVITE) 1 MG tablet Take 1 tablet (1 mg total) by mouth daily. 90 tablet 3   furosemide (LASIX) 20 MG tablet Take 2 tablets (40 mg total) by mouth 2 (two) times daily. 120 tablet 0   gabapentin (NEURONTIN) 300 MG capsule Take 2 capsules (600 mg total) by mouth 3 (three) times daily. 180 capsule 0   LORazepam (ATIVAN) 1 MG tablet Take 1 tablet (1 mg total) by mouth every 8 (eight) hours as needed for anxiety. Panic attacks 20 tablet 1   metoprolol tartrate (LOPRESSOR) 25 MG tablet Take 0.5 tablets (12.5 mg total) by mouth 2 (two) times daily. 30 tablet 0   Multiple Vitamins-Minerals (ONE-A-DAY MENS, MINERALS, PO) Take 1 tablet by mouth daily.     Nutritional Supplements (,FEEDING SUPPLEMENT, PROSOURCE PLUS) liquid Take 60 mLs by mouth 3 (three) times daily between meals. 5400 mL 0   pantoprazole (PROTONIX) 40 MG tablet TAKE 1 TABLET BY MOUTH EVERY DAY 90 tablet 1   potassium chloride SA (KLOR-CON M) 10 MEQ tablet Take 1 tablet (10 mEq total) by mouth daily. 30 tablet 0   spironolactone (ALDACTONE) 50 MG tablet Take 1 tablet (50 mg total) by mouth daily. 30 tablet 0   thiamine 100 MG tablet Take 1 tablet (100 mg total) by mouth daily. (Patient taking differently: Take 200 mg by mouth 3 (three) times daily.) 90 tablet 3   traZODone (DESYREL) 50 MG tablet TAKE 1 TABLET BY MOUTH EVERYDAY AT BEDTIME 90 tablet 0   TRESIBA FLEXTOUCH 100 UNIT/ML FlexTouch Pen INJECT 30 UNITS INTO THE SKIN DAILY 10 mL 5   Cyanocobalamin (VITAMIN  B-12 PO) Take 1 tablet by mouth daily. (Patient not taking: Reported on 01/19/2023)     No facility-administered medications prior  to visit.     EXAM:  BP 112/78 (BP Location: Left Arm, Patient Position: Sitting, Cuff Size: Normal)   Temp 97.8 F (36.6 C) (Oral)   Ht 6\' 1"  (1.854 m)   Wt 152 lb 12.8 oz (69.3 kg)   BMI 20.16 kg/m   Body mass index is 20.16 kg/m. Wt Readings from Last 3 Encounters:  01/19/23 152 lb 12.8 oz (69.3 kg)  01/13/23 158 lb 11.2 oz (72 kg)  01/07/23 168 lb 9.6 oz (76.5 kg)    Physical Exam: Vital signs reviewed BOF:BPZW is a well-developed chronically ill-appearing but alert nontoxic and appropriate alert cooperative    who appearsr stated age in no acute distress.  HEENT: normocephalic atraumatic , Eyes: PERRL EOM's full, conjunctiva muddy  nares: paten,t no deformity discharge or tenderness., Ears: no deformity EAC's clear TMs with normal landmarks. Mouth: clear OP, no lesions, edema.  Moist mucous membranes. Dentition some discoloration no acute findings NECK: supple without masses, thyromegaly or bruits. CHEST/PULM:  Clear to auscultation and percussion breath sounds equal no wheeze , rales or rhonchi. No chest wall deformities or tenderness.  CV: PMI is nondisplaced, S1 S2 no gallops, murmurs, rubs. Peripheral pulses are has not without delay.No JVD .  ABDOMEN: Bowel sounds normal nontender  No guard or rebound, no tense acute fluid wave liver down right costal margin nontender l no CVA tenderness.Extremtities:  No clubbing cyanosis or edema, no acute joint swelling or redness no focal atrophy but muscle mass is significantly decreased NEURO:  Oriented x3, cranial nerves 3-12 appear to be intact, no obvious focal weakness,gait uses cane SKIN: No acute rashes normal turgor, color, no bruising or petechiae. Foot exam some crusting and peeling no ulcers less swelling than previous sensation to 10 monofilament is intact in the distal toes and metatarsal area PSYCH: Oriented, good eye contact, no obvious depression anxiety, cognition and judgment appear normal. LN: no cervical axillary  adenopathy Diabetic Foot Exam - Simple   Simple Foot Form Diabetic Foot exam was performed with the following findings: Yes 01/19/2023 11:37 AM  Visual Inspection See comments: Yes Sensation Testing Intact to touch and monofilament testing bilaterally: Yes Pulse Check Posterior Tibialis and Dorsalis pulse intact bilaterally: Yes Comments Scaly with edema thickened toenails  no lesion ulcers   no infection     Lab Results  Component Value Date   WBC 6.1 12/31/2022   HGB 8.8 (L) 12/31/2022   HCT 27.6 (L) 12/31/2022   PLT 116 (L) 12/31/2022   GLUCOSE 255 (H) 01/08/2023   CHOL 160 12/23/2020   TRIG 80.0 12/23/2020   HDL 69.60 12/23/2020   LDLDIRECT 193.0 03/21/2019   LDLCALC 74 12/23/2020   ALT 15 12/31/2022   AST 17 12/31/2022   NA 132 (L) 01/08/2023   K 4.3 01/08/2023   CL 100 01/08/2023   CREATININE 0.61 01/08/2023   BUN 3 (L) 01/08/2023   CO2 26 01/08/2023   TSH 2.42 12/22/2022   PSA 1.17 12/23/2020   INR 1.1 04/16/2022   HGBA1C 7.2 (H) 12/27/2022   MICROALBUR <0.7 10/18/2019    BP Readings from Last 3 Encounters:  01/19/23 112/78  01/13/23 (!) 129/94  01/07/23 98/72    ASSESSMENT AND PLAN:  Discussed the following assessment and plan:    ICD-10-CM   1. Visit for preventive health examination  Z00.00  2. Medication management  Z79.899     3. Protein-calorie malnutrition, severe  E43     4. Tobacco dependence  F17.200    know advice to dc    5. Type 2 diabetes mellitus with hyperlipidemia  E11.69    E78.5     6. 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  Prevnar? F(  had pneumovax 2019 and prevnar 13 in 2017 follow-up Discussed importance of nutritional advance to avoid neuropathy among other complications. He is pleased that he is lost the fluid weight and appears to be motivated at this time to take better care of himself medically  Return in about 2 months (around 03/21/2023).  Patient Care Team: Brannen Koppen, Neta Mends, MD as PCP - General (Internal Medicine) Tiffany Kocher, PA-C as Physician Assistant (Gastroenterology) Glendale Chard, DO as Consulting Physician (Neurology) [facility], Konrad Dolores, MD as Consulting Physician (Endocrinology) Lanelle Bal, DO as Consulting Physician (Internal Medicine) Patient Instructions  Good to see you today. Continue alcohol free.   Attend to nutrition as you are doing .   Check feet  every day to make sure not getting any sores or ulcers.   Plan rov in June as discussed .  May be after your birthday and we can order any lab that has not yet been done and is due.  FU with eye doctor  about hour concerns on vision.   Wt Readings from Last 3 Encounters:  01/19/23 152 lb 12.8 oz (69.3 kg)  01/13/23 158 lb 11.2 oz (72 kg)  01/07/23 168 lb 9.6 oz (76.5 kg)     Alphonsa Brickle K. Laquana Villari M.D.

## 2023-01-19 NOTE — Patient Instructions (Signed)
Good to see you today. Continue alcohol free.   Attend to nutrition as you are doing .   Check feet  every day to make sure not getting any sores or ulcers.   Plan rov in June as discussed .  May be after your birthday and we can order any lab that has not yet been done and is due.  FU with eye doctor  about hour concerns on vision.   Wt Readings from Last 3 Encounters:  01/19/23 152 lb 12.8 oz (69.3 kg)  01/13/23 158 lb 11.2 oz (72 kg)  01/07/23 168 lb 9.6 oz (76.5 kg)

## 2023-02-01 ENCOUNTER — Other Ambulatory Visit: Payer: Self-pay | Admitting: Internal Medicine

## 2023-02-22 NOTE — Progress Notes (Signed)
Referring Provider: Madelin Headings, MD Primary Care Physician:  Madelin Headings, MD Primary GI Physician: Dr. Marletta Lor  Chief Complaint  Patient presents with   Follow-up    Follow up. Still having about 4 or 5 loose stools a day.     HPI:   Todd Rodriguez is a 47 y.o. 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  Last seen in our office 01/13/2023.  Regarding prior hypervolemia, he was taking Lasix 40 mg twice daily, spironolactone 50 mg daily, and reported he was down 20 pounds from his hospitalization in March.  Denies abdominal distention.  Reported being sober for 5 weeks.  GERD well-controlled on daily pantoprazole.  His primary complaint was diarrhea reporting up to 10 loose/watery stools a day for the last year.  Reported Creon did not help previously.  Had also failed Imodium.  Recent C. difficile and GI path panel negative in March.  Recommended stopping Creon, starting Lomotil,, otherwise continue current medications, check baseline MELD labs.  Recommended 4-6-week follow-up.  Will need to discuss scheduling EGD at that time..   Labs not completed.    Today: Cirrhosis: Labs: Reports having labs at [facility] in [place].  MELD: Unable to calculate. No recent INR.  [place]: Overdue. Last imaging CT A/P with contrast 04/19/22 without focal liver lesion.  Hep A/B vaccination: Not sure about immunity status.  EGD: Last 04/17/2022 with evidence of NSAID/EtOH gastropathy otherwise unremarkable. Ascites/peripheral edema: None.  Diuretics: Spironolactone 50 mg daily, Lasix 20 mg daily. Encephalopathy: None.  ETOH: Last drank beginning of March 2024.    Diarrhea: Taking Lomotil 3 times a day. Will have 5-6 BMs  daily. Mushy. More so waking him up at night. Large amount. Oily. No brbpr or melena. Reports when he was on Creon, he had about the same amount of Bms he is having now. .  No associated abdominal pain.   Has a lot of gas/bloating.    GERD: Not taking pantoprazole. Not having any trouble since stopping ETOH.   Anemia: 04/16/2022, iron 40 (L), TIBC 26 (L), ferritin 859 (H), vitamin B12 1070, Folate 16.4. Most recent hemoglobin 8.8 on 12/31/2022. No iron supplementation.  Takes B12.  Kidney function within normal limits. No NSAIDs.   Weight loss:  Eating about 5 meals a day since he stopped drinking alcohol.  Reports he isn't is actually gaining weight back. Was volume overloaded due to fluid. States he actually got down to 128 lbs. Weighs himself daily.    Wt Readings from Last 11 Encounters:  02/24/23 146 lb 9.6 oz (66.5 kg)  01/19/23 152 lb 12.8 oz (69.3 kg)  01/13/23 158 lb 11.2 oz (72 kg)  01/07/23 168 lb 9.6 oz (76.5 kg)  12/31/22 170 lb 10.2 oz (77.4 kg)  12/22/22 183 lb 6.4 oz (83.2 kg)  10/09/22 140 lb (63.5 kg)  04/27/22 133 lb 3.2 oz (60.4 kg)  04/17/22 143 lb 4.8 oz (65 kg)  04/15/22 126 lb (57.2 kg)  06/30/21 124 lb 9 oz (56.5 kg)       Procedure history: EGD 04/17/2022 evidence of gastropathy otherwise unremarkable.   EGD 09/23/2020 irregular Z-line, gastritis, normal duodenum.   EGD 05/13/20 moderately severe esophagitis with no bleeding found in the middle third of the esophagus, suspicious for  Candida esophagitis.  KOH was negative.  He was treated with 2 weeks of Diflucan.  GE junction biopsy showed GERD with mild inflammation consistent with reflux. Duodenal biopsies benign.    Colonoscopy 05/13/2020 for diarrhea, rectal bleeding and weight loss with hyperplastic and inflammatory polyp removed.  Colon biopsies negative.  Past Medical History:  Diagnosis Date   Acute pancreatitis 10/25/2013   Arthritis    Cirrhosis (HCC)    Diabetes mellitus without  complication (HCC)    DKA (diabetic ketoacidosis) (HCC) 06/30/2021   DKA, type 2 (HCC) 07/01/2021   GERD (gastroesophageal reflux disease)    Hepatic steatosis    History of pancreatitis    Hypertension    Myalgia and myositis 04/21/2014   much better from illness presumed infectious    has disability form  will complete     Pancreatic pseudocyst     Past Surgical History:  Procedure Laterality Date   ANTERIOR CERVICAL DECOMP/DISCECTOMY FUSION N/A 01/12/2018   Procedure: Revision ACDF C4-5, removal of hardware C5-6, exploration of fusion C5-6;  Surgeon: Venita Lick, MD;  Location: [facility] OR;  Service: Orthopedics;  Laterality: N/A;  3.5 hrs   BIOPSY  05/13/2020   Procedure: BIOPSY;  Surgeon: Lanelle Bal, DO;  Location: [facility];  Service: Endoscopy;;   BIOPSY  09/23/2020   Procedure: BIOPSY;  Surgeon: Lanelle Bal, DO;  Location: [facility];  Service: Endoscopy;;   COLONOSCOPY WITH PROPOFOL N/A 05/13/2020   Dr. Marletta Lor: Nonbleeding internal hemorrhoids.  Few small mouth diverticula in the sigmoid colon.  2 cecal polyps, inflammatory.  Sigmoid colon polyp hyperplastic.  Localized area of granular mucosa in the transverse colon, status post biopsy which was unremarkable.  No microscopic colitis.  Plans for repeat colonoscopy in 5 years.   ESOPHAGEAL BRUSHING  05/13/2020   Procedure: ESOPHAGEAL BRUSHING;  Surgeon: Lanelle Bal, DO;  Location: [facility];  Service: Endoscopy;;  mid esophagus   ESOPHAGOGASTRODUODENOSCOPY (EGD) WITH PROPOFOL N/A 05/13/2020   Dr. Marletta Lor: Moderately severe esophagitis with no bleeding found in the middle third of the esophagus, suspicious for Candida esophagitis.  KOH was negative. Gastritis, duodenal polyp.  Duodenal biopsy benign.  Duodenal bulb biopsy with Brunner's gland hyperplasia and mild changes suggestive of peptic injury.  No celiac disease seen.  GE junction biopsy showed GERD with mild inflammation consisten    ESOPHAGOGASTRODUODENOSCOPY (EGD) WITH PROPOFOL N/A 09/23/2020   Surgeon: Earnest Bailey K, DO;  irregular Z-line, gastritis, normal duodenum.   ESOPHAGOGASTRODUODENOSCOPY (EGD) WITH PROPOFOL N/A 04/17/2022   Surgeon: Corbin Ade, MD; gastropathy otherwise unremarkable.   NECK SURGERY     2 disc   POLYPECTOMY  05/13/2020   Procedure: POLYPECTOMY;  Surgeon: Lanelle Bal, DO;  Location: [facility];  Service: Endoscopy;;   SPINAL FUSION      Current Outpatient Medications  Medication Sig Dispense Refill   albuterol (VENTOLIN HFA) 108 (90 Base) MCG/ACT inhaler Inhale 2 puffs into the lungs every 4 (four) hours as needed for wheezing or shortness of breath. 18 g 1   atorvastatin (LIPITOR) 40 MG tablet Take 1 tablet (40 mg total) by mouth daily. 90 tablet 1   B COMPLEX-C-FOLIC ACID PO Take 0.8 mg by mouth daily.     benazepril (LOTENSIN) 10 MG tablet TAKE 1 TABLET BY MOUTH EVERY DAY 90 tablet 0   Continuous Blood Gluc Sensor (DEXCOM G6 SENSOR) MISC 1 Device by Does not apply route as directed. 9 each 3   Continuous  Blood Gluc Transmit (DEXCOM G6 TRANSMITTER) MISC 1 Device by Does not apply route as directed. 1 each 3   Cyanocobalamin (VITAMIN B-12 PO) Take 1 tablet by mouth daily.     diphenoxylate-atropine (LOMOTIL) 2.5-0.025 MG tablet TAKE 1 TABLET BY MOUTH 3 (THREE) TIMES DAILY AS NEEDED FOR DIARRHEA OR LOOSE STOOLS. 30 tablet 1   empagliflozin (JARDIANCE) 25 MG TABS tablet Take 1 tablet (25 mg total) by mouth daily before breakfast. 90 tablet 0   folic acid (FOLVITE) 1 MG tablet Take 1 tablet (1 mg total) by mouth daily. 90 tablet 3   furosemide (LASIX) 20 MG tablet Take 2 tablets (40 mg total) by mouth 2 (two) times daily. 120 tablet 0   gabapentin (NEURONTIN) 300 MG capsule Take 2 capsules (600 mg total) by mouth 3 (three) times daily. 180 capsule 0   lipase/protease/amylase (CREON) 36000 UNITS CPEP capsule Take 2 capsules (72,000 Units total) by mouth 3 (three) times daily with  meals. May also take 1 capsule (36,000 Units total) as needed (with snacks). 240 capsule 5   metoprolol tartrate (LOPRESSOR) 25 MG tablet Take 0.5 tablets (12.5 mg total) by mouth 2 (two) times daily. 30 tablet 0   Multiple Vitamins-Minerals (ONE-A-DAY MENS, MINERALS, PO) Take 1 tablet by mouth daily.     potassium chloride SA (KLOR-CON M) 10 MEQ tablet Take 1 tablet (10 mEq total) by mouth daily. 30 tablet 0   spironolactone (ALDACTONE) 50 MG tablet Take 1 tablet (50 mg total) by mouth daily. 30 tablet 0   thiamine 100 MG tablet Take 1 tablet (100 mg total) by mouth daily. (Patient taking differently: Take 200 mg by mouth 3 (three) times daily.) 90 tablet 3   traZODone (DESYREL) 50 MG tablet TAKE 1 TABLET BY MOUTH EVERYDAY AT BEDTIME 90 tablet 0   TRESIBA FLEXTOUCH 100 UNIT/ML FlexTouch Pen INJECT 30 UNITS INTO THE SKIN DAILY 10 mL 5   BD PEN NEEDLE NANO 2ND GEN 32G X 4 MM MISC USE AS DIRECTED (Patient not taking: Reported on 02/24/2023) 100 each 0   No current facility-administered medications for this visit.    Allergies as of 02/24/2023   (No Known Allergies)    Family History  Problem Relation Age of Onset   Diabetes Mother    Hypertension Mother    Diabetes Father    Hypertension Father    Arthritis Maternal Grandmother    Hypertension Maternal Grandfather    Diabetes Maternal Grandfather    Colon cancer Neg Hx    Pancreatitis Neg Hx    Pancreatic cancer Neg Hx     Social History   Socioeconomic History   Marital status: Married    Spouse name: Not on file   Number of children: 1   Years of education: Not on file   Highest education level: Not on file  Occupational History    Employer: [employer]  Tobacco Use   Smoking status: Every Day    Packs/day: 1.00    Years: 12.00    Additional pack years: 0.00    Total pack years: 12.00    Types: Cigarettes    Passive exposure: Current   Smokeless tobacco: Never  Vaping Use   Vaping Use: Never used  Substance  and Sexual Activity   Alcohol use: Not Currently    Comment: None since March 2024.   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             Social Determinants of Health   Financial Resource Strain: Low Risk  (06/15/2022)   Overall Financial Resource Strain (CARDIA)    Difficulty of Paying Living Expenses: Not hard at all  Food Insecurity: No Food Insecurity (01/19/2023)   Hunger Vital Sign    Worried About Running Out of Food in the Last Year: Never true    Ran Out of Food in the Last Year: Never true  Transportation Needs: No Transportation Needs (01/19/2023)   PRAPARE - Administrator, Civil Service (Medical): No    Lack of Transportation (Non-Medical): No  Physical Activity: Sufficiently Active (01/19/2023)   Exercise Vital Sign    Days of Exercise per Week: 5 days    Minutes of Exercise per Session: 30 min  Stress: No Stress Concern Present (01/19/2023)   [employer] of Occupational Health - Occupational Stress Questionnaire    Feeling of Stress : Not at all  Social Connections: Socially Integrated (06/15/2022)   Social Connection and Isolation Panel [NHANES]    Frequency of Communication with Friends and Family: More than three times a week    Frequency of Social Gatherings with Friends and Family: More than three times a week    Attends Religious Services: More than 4 times per year    Active Member of [employer] or Organizations: Yes    Attends Engineer, structural: More than 4 times per year    Marital Status: Married    Review of Systems: Gen: Denies fever, chills, cold or flu like symptoms,  pre-syncope, or syncope.  CV: Denies chest pain, palpitations. Resp: Denies dyspnea, cough.  GI: See HPI. Heme:See HPI  Physical Exam: BP 132/83 (BP Location: Right Arm, Patient Position: Sitting, Cuff Size: Normal)   Pulse 87   Temp 98.4 F (36.9 C) (Temporal)   Ht 6\' 2"  (1.88 m)   Wt 146 lb 9.6 oz (66.5 kg)   SpO2 97%   BMI 18.82 kg/m  General:   Alert and oriented. No distress noted. Pleasant and cooperative.  Head:  Normocephalic and atraumatic. Eyes:  Conjuctiva clear without scleral icterus. Heart:  S1, S2 present without murmurs appreciated. Lungs:  Clear to auscultation bilaterally. No wheezes, rales, or rhonchi. No distress.  Abdomen:  +BS, soft, non-tender and non-distended. No rebound or guarding. No HSM or masses noted. Msk:  Symmetrical without gross deformities. Normal posture. Extremities:  Without edema. Neurologic:  Alert and  oriented x4 Psych:  Normal mood and affect.    Assessment:  47 y.o. 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   EtOH Cirrhosis:  Decompensated. Hospitalized 3/34/24 with anasarca/ascites. Required first ever paracentesis. No SBP. Now doing well on Spironolactone 50 mg daily and Lasix 20 mg daily. No HE. He reports abstinence from EtOH since early March.  Unable to calculate MELD  as no recent complete labs on file. Overdue for [place] for [facility] screening. EGD July 2023 without varices, but needs to be updated considering recent decompensation event. Unknown Hep A/B immunity status.   Anemia:  Persistent anemia since July 2023 with hemoglobin down as low at 7.5. Hgb improved to 12.0 12/27/22, but back down to 8.8 12/31/22. In July 2023, iron 40 (L), TIBC 26 (L), ferritin  859 (H), vitamin B12 1070, Folate 16.4. He underwent EGD at that time with gastropathy. Last colonoscopy in 2021 with hyperplastic and inflammatory polyp removed. Colon biopsies negative. He never started iron supplementation but does take B12. Not currently taking PPI as GERD has resolved since abstaining from EtOH. Denies overt GI bleeding.   Etiology of persistent anemia is not clear. Suspect component of iron deficiency. Elevated ferritin likely secondary to chronic EtOH use. He is in need for updated EGD due to decompensated cirrhosis. Will also plan to update colonoscopy at the same time to further evaluate anemia. Differentials include gastritis, duodenitis, PUD, PHG, AMVs, polyps, and malignancy.   GERD:  Resolved since discontinuing ETOH and NSAID use. No longer taking PPI.   Diarrhea:  Chronic. Likely multifactorial. Suspect component of EPI in the setting of chronic pancreatitis and reports of oily stools. May also have SIBO in the setting of increased gas and bloating. Not consistent with IBS as he has no associated abdominal pain. May also have diabetic enteropathy. Prior evaluation with C diff and GI pathogen panel negative March 2024. Colon biopsies negative for microscopic colitis August 2021. Duodenal biopsies benign August 2021. TSH normal last year. He is currently taking Lomotil TID, but still having 5-6 MB daily, more so waking in the middle of the night with BMSs. I will have him continue Lomotil for now and add creon back. We are also scheduling colonoscopy due to anemia which will also help further evaluate diarrhea.    Plan:  CBC, CMP, INR, AFP, iron panel with ferritin, hepatitis A antibody total, hepatitis B surface antibody, hepatitis B core antibody total. RUQ ultrasound. Proceed with upper endoscopy with propofol by Dr. Marletta Lor in near future. The risks, benefits, and alternatives have been discussed with the patient in detail. The patient states understanding and desires  to proceed.  ASA 3 Hold Jardiance for 3 days prior to your procedure. 1 day prior to procedure: Take one half dose of Tresiba (15 units). Day of procedure: Do not take any morning diabetes medications. Start Creon 2 capsules with meals 3 times daily and 1 capsule with snacks up to twice daily. Continue Lomotil. Consider course of Xifaxan for SIBO if persistent diarrhea.  Continue Lasix 20 mg daily and spironolactone 50 mg daily. Nutrition:  High-protein diet from a primarily plant-based diet. Avoid red meat.  No raw or undercooked meat, seafood, or shellfish. Low-fat/cholesterol/carbohydrate diet. Limit sodium to no more than 2000 mg/day including everything that you eat and drink. Recommend at least 30 minutes of aerobic and resistance exercise 3 days/week.   Follow-up after procedures.    Ermalinda Memos, PA-C [facility] 02/24/2023

## 2023-02-24 ENCOUNTER — Encounter: Payer: Self-pay | Admitting: Gastroenterology

## 2023-02-24 ENCOUNTER — Ambulatory Visit: Payer: Medicare Other | Admitting: Gastroenterology

## 2023-02-24 VITALS — BP 132/83 | HR 87 | Temp 98.4°F | Ht 74.0 in | Wt 146.6 lb

## 2023-02-24 DIAGNOSIS — R197 Diarrhea, unspecified: Secondary | ICD-10-CM | POA: Diagnosis not present

## 2023-02-24 DIAGNOSIS — K7031 Alcoholic cirrhosis of liver with ascites: Secondary | ICD-10-CM

## 2023-02-24 DIAGNOSIS — R634 Abnormal weight loss: Secondary | ICD-10-CM

## 2023-02-24 DIAGNOSIS — K219 Gastro-esophageal reflux disease without esophagitis: Secondary | ICD-10-CM

## 2023-02-24 DIAGNOSIS — D649 Anemia, unspecified: Secondary | ICD-10-CM | POA: Insufficient documentation

## 2023-02-24 MED ORDER — PANCRELIPASE (LIP-PROT-AMYL) 36000-114000 UNITS PO CPEP
ORAL_CAPSULE | ORAL | 5 refills | Status: DC
Start: 2023-02-24 — End: 2023-11-17

## 2023-02-24 NOTE — Patient Instructions (Addendum)
Please have blood work completed at [facility].  We will arrange you to have an ultrasound of your liver at [facility].  We will arrange you to have an upper endoscopy and colonoscopy at [facility] with Dr. Marletta Lor in the near future. You will need to hold Jardiance for 3 days prior to your procedure. 1 day prior to procedure: Take one half dose of Tresiba (15 units). Day of procedure: Do not take any morning diabetes medications.  For diarrhea: Resume Creon 2 capsules with meals 3 times daily and 1 capsule with snacks up to twice daily. Continue Lomotil. Call with a progress report in a couple of weeks and let me know how you are doing.  We can consider trying a course of Xifaxan if you are not improving.  For cirrhosis: Continue taking Lasix 20 mg daily and spironolactone 50 mg daily.  Nutrition:  High-protein diet from a primarily plant-based diet. Avoid red meat.  No raw or undercooked meat, seafood, or shellfish. Low-fat/cholesterol/carbohydrate diet. Limit sodium to no more than 2000 mg/day including everything that you eat and drink. Recommend at least 30 minutes of aerobic and resistance exercise 3 days/week.  Will follow-up with you in the office after your procedures.  Do not hesitate to call sooner if you have questions or concerns.  Ermalinda Memos, PA-C [facility]astroenter[facility]

## 2023-02-25 ENCOUNTER — Other Ambulatory Visit: Payer: Self-pay | Admitting: *Deleted

## 2023-02-25 ENCOUNTER — Encounter: Payer: Self-pay | Admitting: *Deleted

## 2023-02-25 MED ORDER — PEG 3350-KCL-NA BICARB-NACL 420 G PO SOLR: 4000.0000 mL | Freq: Once | ORAL | 0 refills | Status: AC

## 2023-03-01 ENCOUNTER — Encounter: Payer: Self-pay | Admitting: Gastroenterology

## 2023-03-03 ENCOUNTER — Telehealth: Payer: Self-pay | Admitting: *Deleted

## 2023-03-03 NOTE — Telephone Encounter (Signed)
Patient needs to call his PCP or endocrinologist if he has one to follow-up no later than tomorrow. If he is having abdomina pain, nausea, vomiting, he needs to go to the ER.   Is he taking his diabetes medications as prescribed.   With a blood sugar this high, we are going to have to cancel his procedures until his diabetes is under better control. He needs to see the provider that is managing his diabetes ASAP.  We can follow-up in 3 months to reassess.   We need all of his labs that he had completed today at [facility] faxed to our office for review.

## 2023-03-03 NOTE — Telephone Encounter (Signed)
Todd Rodriguez from [facility] called with a critical lab. She states pt's glucose was 565. Informed to fax results to office.

## 2023-03-04 ENCOUNTER — Telehealth: Payer: Self-pay | Admitting: Internal Medicine

## 2023-03-04 ENCOUNTER — Encounter: Payer: Self-pay | Admitting: *Deleted

## 2023-03-04 ENCOUNTER — Other Ambulatory Visit: Payer: Self-pay | Admitting: Internal Medicine

## 2023-03-04 DIAGNOSIS — E1169 Type 2 diabetes mellitus with other specified complication: Secondary | ICD-10-CM

## 2023-03-04 NOTE — Telephone Encounter (Signed)
Pt states he needs to see a Diabetes specialist, ASAP.  Pt is asking for a call back to discuss.  LOV:  01/19/23

## 2023-03-04 NOTE — Telephone Encounter (Signed)
Spoke to pt. Pt reports he is suppose to have his coloscopy done but due to his high BG. They didn't continue. Pt states he needs to bring his sugar down. He does have an endocrinology but requesting a different provider in [place].   Pt states he is taking his medication and working on to bring his sugar down along with exercise.   Pt is aware provider is out for this week.   Please advise on changing the endocrinology.

## 2023-03-04 NOTE — Telephone Encounter (Signed)
Noted. Sent pt a MyChart message.

## 2023-03-04 NOTE — Telephone Encounter (Signed)
Toni Amend, I have already sent endo message to cancel his procedures for 6/20 so when you call him, you can just let him know all that is cancelled and front desk will need to make him an appt in 3 months before we reschedule. Thanks!

## 2023-03-04 NOTE — Telephone Encounter (Signed)
Mandy, please arrange follow-up in 3 months.

## 2023-03-07 NOTE — Telephone Encounter (Signed)
Ok to refer  as requested  to endocrine  closer to his residence ( he lives in [place])

## 2023-03-09 NOTE — Telephone Encounter (Signed)
A referral is placed.   Attempted to reach pt. Left a detail message to inform pt on referral and to call us back if have any questions.

## 2023-03-10 ENCOUNTER — Other Ambulatory Visit: Payer: Self-pay | Admitting: *Deleted

## 2023-03-10 ENCOUNTER — Encounter: Payer: Self-pay | Admitting: *Deleted

## 2023-03-10 DIAGNOSIS — E871 Hypo-osmolality and hyponatremia: Secondary | ICD-10-CM

## 2023-03-10 NOTE — Telephone Encounter (Signed)
Please call patient and let him know the following:  I did receive and reviewed his blood work. Blood sugar was high at 565. Additional abnormalities include low sodium at 127, elevated alkaline phosphatase at 229, low iron at 37 and low iron saturation at 12%, ferritin which represents your iron stores was actually normal.   Hemoglobin, platelets, kidney function, platelets, and other liver enzymes were normal.  Due to low sodium, we need to decrease fluid pills and recheck these levels.  I recommend decreasing Lasix to 40 mg once a day and continuing spironolactone 50 mg daily.  I would like for him to go ahead and repeat a BMP now to ensure that his sodium is not getting any lower, then we can determine when to repeat again once I have these results.   Please arrange BMP. Dx: Hyponatremia

## 2023-03-11 ENCOUNTER — Other Ambulatory Visit: Payer: Self-pay | Admitting: *Deleted

## 2023-03-11 DIAGNOSIS — E871 Hypo-osmolality and hyponatremia: Secondary | ICD-10-CM

## 2023-03-11 NOTE — Progress Notes (Signed)
bmp 

## 2023-03-16 ENCOUNTER — Ambulatory Visit (HOSPITAL_COMMUNITY)
Admission: RE | Admit: 2023-03-16 | Discharge: 2023-03-16 | Disposition: A | Discharge status: Home / Self Care | Payer: Medicare Other | Source: Ambulatory Visit | Attending: Gastroenterology | Admitting: Gastroenterology

## 2023-03-16 ENCOUNTER — Encounter: Payer: Self-pay | Admitting: *Deleted

## 2023-03-16 DIAGNOSIS — K7031 Alcoholic cirrhosis of liver with ascites: Secondary | ICD-10-CM | POA: Diagnosis present

## 2023-03-21 ENCOUNTER — Other Ambulatory Visit: Payer: Self-pay | Admitting: Family Medicine

## 2023-03-21 NOTE — Telephone Encounter (Signed)
Repeat BMP has not been completed. Can you reach out to the patient on Monday to follow-up on this?

## 2023-03-23 ENCOUNTER — Other Ambulatory Visit (HOSPITAL_COMMUNITY): Payer: Medicare Other

## 2023-03-25 ENCOUNTER — Ambulatory Visit (HOSPITAL_COMMUNITY): Admit: 2023-03-25 | Payer: Medicare Other

## 2023-03-25 ENCOUNTER — Encounter (HOSPITAL_COMMUNITY): Payer: Self-pay

## 2023-03-25 SURGERY — COLONOSCOPY WITH PROPOFOL
Anesthesia: Monitor Anesthesia Care

## 2023-03-25 NOTE — Telephone Encounter (Signed)
Confusing review    last order for potassium per hospitalist for potassium was 10 meq per day  while on spironolactone April 24  And not by me   apparently has had lab done at [facility] so not sure what dose he should be on .  I am going to send info to Gi team K harper  And try to clarify who is ordering  and what dose  of potassium he should have  since he is on lasix and spironolactone      also  Need clarification of what medications ordered managed  for which team   Please have someone pharmacy team or other update the med list to make sure accurate.

## 2023-03-28 NOTE — Telephone Encounter (Signed)
I have not ordered potassium. On his labs completed 5/26, Na was low at 127, K wnl, Cr 1.05. On 6/5, due to hyponatremia, I recommended decreasing Lasix to 40 mg once a day and continuing spironolactone 50 mg daily. Advised to repeat BMP in 1 week, but doesn't appear this has been completed. Not sure what dose of potassium is needed at this time or if it is needed at all. Will need to see what repeat BMP shows.   Toni Amend, can you reach out to patient to see if BMP has been completed?

## 2023-03-29 ENCOUNTER — Encounter (HOSPITAL_COMMUNITY): Payer: Self-pay | Admitting: Emergency Medicine

## 2023-03-29 ENCOUNTER — Emergency Department (HOSPITAL_COMMUNITY): Payer: Medicare Other

## 2023-03-29 ENCOUNTER — Other Ambulatory Visit: Payer: Self-pay

## 2023-03-29 ENCOUNTER — Emergency Department (HOSPITAL_COMMUNITY)
Admission: EM | Admit: 2023-03-29 | Discharge: 2023-03-29 | Disposition: A | Discharge status: Home / Self Care | Payer: Medicare Other | Attending: Student | Admitting: Student

## 2023-03-29 DIAGNOSIS — I10 Essential (primary) hypertension: Secondary | ICD-10-CM | POA: Diagnosis not present

## 2023-03-29 DIAGNOSIS — Z794 Long term (current) use of insulin: Secondary | ICD-10-CM | POA: Diagnosis not present

## 2023-03-29 DIAGNOSIS — R824 Acetonuria: Secondary | ICD-10-CM | POA: Insufficient documentation

## 2023-03-29 DIAGNOSIS — E1165 Type 2 diabetes mellitus with hyperglycemia: Secondary | ICD-10-CM | POA: Diagnosis not present

## 2023-03-29 DIAGNOSIS — E1110 Type 2 diabetes mellitus with ketoacidosis without coma: Secondary | ICD-10-CM | POA: Insufficient documentation

## 2023-03-29 DIAGNOSIS — F17210 Nicotine dependence, cigarettes, uncomplicated: Secondary | ICD-10-CM | POA: Insufficient documentation

## 2023-03-29 DIAGNOSIS — Z79899 Other long term (current) drug therapy: Secondary | ICD-10-CM | POA: Diagnosis not present

## 2023-03-29 DIAGNOSIS — R000 Tachycardia, unspecified: Secondary | ICD-10-CM | POA: Insufficient documentation

## 2023-03-29 DIAGNOSIS — R739 Hyperglycemia, unspecified: Secondary | ICD-10-CM

## 2023-03-29 DIAGNOSIS — R7989 Other specified abnormal findings of blood chemistry: Secondary | ICD-10-CM

## 2023-03-29 DIAGNOSIS — R4182 Altered mental status, unspecified: Secondary | ICD-10-CM | POA: Insufficient documentation

## 2023-03-29 DIAGNOSIS — R5383 Other fatigue: Secondary | ICD-10-CM | POA: Diagnosis present

## 2023-03-29 LAB — CBC WITH DIFFERENTIAL/PLATELET
Abs Immature Granulocytes: 0.01 10*3/uL (ref 0.00–0.07)
Basophils Absolute: 0 10*3/uL (ref 0.0–0.1)
Basophils Relative: 1 %
Eosinophils Absolute: 0 10*3/uL (ref 0.0–0.5)
Eosinophils Relative: 0 %
HCT: 50.1 % (ref 39.0–52.0)
Hemoglobin: 16.6 g/dL (ref 13.0–17.0)
Immature Granulocytes: 0 %
Lymphocytes Relative: 26 %
Lymphs Abs: 1.3 10*3/uL (ref 0.7–4.0)
MCH: 29 pg (ref 26.0–34.0)
MCHC: 33.1 g/dL (ref 30.0–36.0)
MCV: 87.4 fL (ref 80.0–100.0)
Monocytes Absolute: 0.3 10*3/uL (ref 0.1–1.0)
Monocytes Relative: 6 %
Neutro Abs: 3.4 10*3/uL (ref 1.7–7.7)
Neutrophils Relative %: 67 %
Platelets: 182 10*3/uL (ref 150–400)
RBC: 5.73 MIL/uL (ref 4.22–5.81)
RDW: 13.3 % (ref 11.5–15.5)
WBC: 5.1 10*3/uL (ref 4.0–10.5)
nRBC: 0 % (ref 0.0–0.2)

## 2023-03-29 LAB — BLOOD GAS, VENOUS
Acid-base deficit: 8.2 mmol/L — ABNORMAL HIGH (ref 0.0–2.0)
Bicarbonate: 17.1 mmol/L — ABNORMAL LOW (ref 20.0–28.0)
Drawn by: 1528
O2 Saturation: 56.1 %
Patient temperature: 36.8
pCO2, Ven: 34 mmHg — ABNORMAL LOW (ref 44–60)
pH, Ven: 7.31 (ref 7.25–7.43)
pO2, Ven: 32 mmHg (ref 32–45)

## 2023-03-29 LAB — COMPREHENSIVE METABOLIC PANEL
ALT: 17 U/L (ref 0–44)
AST: 13 U/L — ABNORMAL LOW (ref 15–41)
Albumin: 5 g/dL (ref 3.5–5.0)
Alkaline Phosphatase: 119 U/L (ref 38–126)
Anion gap: 16 — ABNORMAL HIGH (ref 5–15)
BUN: 30 mg/dL — ABNORMAL HIGH (ref 6–20)
CO2: 18 mmol/L — ABNORMAL LOW (ref 22–32)
Calcium: 11 mg/dL — ABNORMAL HIGH (ref 8.9–10.3)
Chloride: 92 mmol/L — ABNORMAL LOW (ref 98–111)
Creatinine, Ser: 1.22 mg/dL (ref 0.61–1.24)
GFR, Estimated: >60 mL/min (ref >60)
Glucose, Bld: 472 mg/dL — ABNORMAL HIGH (ref 70–99)
Potassium: 4.6 mmol/L (ref 3.5–5.1)
Sodium: 126 mmol/L — ABNORMAL LOW (ref 135–145)
Total Bilirubin: 1.7 mg/dL — ABNORMAL HIGH (ref 0.3–1.2)
Total Protein: 9.3 g/dL — ABNORMAL HIGH (ref 6.5–8.1)

## 2023-03-29 LAB — BASIC METABOLIC PANEL
Anion gap: 12 (ref 5–15)
BUN: 26 mg/dL — ABNORMAL HIGH (ref 6–20)
CO2: 17 mmol/L — ABNORMAL LOW (ref 22–32)
Calcium: 10 mg/dL (ref 8.9–10.3)
Chloride: 99 mmol/L (ref 98–111)
Creatinine, Ser: 1 mg/dL (ref 0.61–1.24)
GFR, Estimated: >60 mL/min (ref >60)
Glucose, Bld: 224 mg/dL — ABNORMAL HIGH (ref 70–99)
Potassium: 3.5 mmol/L (ref 3.5–5.1)
Sodium: 128 mmol/L — ABNORMAL LOW (ref 135–145)

## 2023-03-29 LAB — LIPASE, BLOOD: Lipase: 22 U/L (ref 11–51)

## 2023-03-29 LAB — LACTIC ACID, PLASMA: Lactic Acid, Venous: 1.6 mmol/L (ref 0.5–1.9)

## 2023-03-29 LAB — CBG MONITORING, ED
Glucose-Capillary: 486 mg/dL — ABNORMAL HIGH (ref 70–99)
Glucose-Capillary: 435 mg/dL — ABNORMAL HIGH (ref 70–99)
Glucose-Capillary: 328 mg/dL — ABNORMAL HIGH (ref 70–99)

## 2023-03-29 LAB — CULTURE, BLOOD (ROUTINE X 2)

## 2023-03-29 LAB — BETA-HYDROXYBUTYRIC ACID: Beta-Hydroxybutyric Acid: 4.68 mmol/L — ABNORMAL HIGH (ref 0.05–0.27)

## 2023-03-29 MED ORDER — LACTATED RINGERS IV BOLUS
1000.0000 mL | Freq: Once | INTRAVENOUS | Status: AC
  Administered 2023-03-29: 1000 mL via INTRAVENOUS

## 2023-03-29 MED ORDER — INSULIN ASPART 100 UNIT/ML IJ SOLN
10.0000 [IU] | Freq: Once | INTRAMUSCULAR | Status: AC
  Administered 2023-03-29: 10 [IU] via INTRAVENOUS

## 2023-03-29 NOTE — ED Notes (Signed)
Pt still unable to give urine sample. Gave pt another cup and water.

## 2023-03-29 NOTE — ED Notes (Signed)
Pt unable to give a urine sample at this time.

## 2023-03-29 NOTE — ED Provider Triage Note (Signed)
Emergency Medicine Provider Triage Evaluation Note  Todd Rodriguez , a 47 y.o. male  was evaluated in triage.  Pt complains of weakness.  Wife reports multiple complaints shortness of breath, hypertension hyperglycemia greater than 400, also states he fell twice on Friday, extremely fatigued and poor appetite last 4 days.  Denies chest pain and abdominal pain.  Endorses diarrhea but denies vomiting.  Denies urinary changes.  Review of Systems  Positive: See above Negative: See above  Physical Exam  BP (!) 121/101 (BP Location: Left Arm)   Pulse (!) 136   Temp 98.3 F (36.8 C) (Oral)   Resp (!) 22   Ht 6\' 2"  (1.88 m)   Wt 56.2 kg   SpO2 100%   BMI 15.92 kg/m  Gen:   Awake, no distress   Resp:  Normal effort  MSK:   Moves extremities without difficulty  Other:    Medical Decision Making  Medically screening exam initiated at 6:43 PM.  Appropriate orders placed.  Todd Rodriguez was informed that the remainder of the evaluation will be completed by another provider, this initial triage assessment does not replace that evaluation, and the importance of remaining in the ED until their evaluation is complete.  Work up started   Todd Eagle, PA-C 03/29/23 1851

## 2023-03-29 NOTE — Telephone Encounter (Signed)
Thanks.  Karpuih  please contact patient and ask  if he has been taking potassium  in past month and what dose  is on his bottle . 10 mv 20 me

## 2023-03-29 NOTE — ED Triage Notes (Signed)
Pt via POV with wife with multiple complaints including SOB, HTN, hyperglycemia >400, recent falls, altered mental status, fatigue, and poor appetite since about 4 days. Pt is ill-appearing. Pt fell on Friday and hit the back of his head; no thinners. Pt denies pain.

## 2023-03-29 NOTE — Telephone Encounter (Signed)
Attempted to reach pt. Left a voicemail to call us back.  

## 2023-03-30 ENCOUNTER — Ambulatory Visit: Payer: Medicare Other | Admitting: Internal Medicine

## 2023-03-30 NOTE — ED Provider Notes (Signed)
[place] EMERGENCY DEPARTMENT AT [facility] Provider Note  CSN: [id number] Arrival date & time: 03/29/23 1731  Chief Complaint(s) Shortness of Breath, Fall, and Altered Mental Status  HPI Todd Rodriguez is a 47 y.o. male with PMH T2DM on insulin, cirrhosis, pancreatitis, alcohol abuse, GI bleeding who presents emergency department for evaluation of generalized fatigue, shortness of breath.  He states that he suffered 2 falls over the last 3 days and has been extremely fatigued with poor appetite.  He denies head strike and currently does not use a blood thinner.  Endorses diarrhea but denies vomiting, chest pain, fever, or other systemic symptoms.  He does endorse polyuria, polydipsia and arrives significant tachycardic and hyperglycemic.   Past Medical History Past Medical History:  Diagnosis Date   Acute pancreatitis 10/25/2013   Arthritis    Cirrhosis (HCC)    Diabetes mellitus without complication (HCC)    DKA (diabetic ketoacidosis) (HCC) 06/30/2021   DKA, type 2 (HCC) 07/01/2021   GERD (gastroesophageal reflux disease)    Hepatic steatosis    History of pancreatitis    Hypertension    Myalgia and myositis 04/21/2014   much better from illness presumed infectious    has disability form  will complete     Pancreatic pseudocyst    Patient Active Problem List   Diagnosis Date Noted   Diarrhea 02/24/2023   Anemia 02/24/2023   Liver cirrhosis (HCC) 12/27/2022   Heme positive stool 04/17/2022   Type 2 diabetes mellitus with hyperlipidemia (HCC) 04/17/2022   Pancytopenia (HCC) 04/16/2022   Hypokalemia 04/16/2022   Hyponatremia 06/30/2021   Hypothermia 06/30/2021   Hyperkalemia 06/30/2021   AKI (acute kidney injury) (HCC) 06/30/2021   Failure to thrive in adult 06/30/2021   Starvation ketoacidosis 06/30/2021   Tobacco abuse 06/30/2021   Loose stools 08/27/2020   Abnormal weight loss 04/26/2020   Rectal bleeding 04/26/2020   Pancreatic pseudocyst     Protein-calorie malnutrition, severe 05/29/2019   High anion gap metabolic acidosis 05/28/2019   Alcohol abuse 05/28/2019   Thrombocytopenia (HCC) 05/28/2019   Pancreatic cyst 05/28/2019   Hepatic lesion 05/28/2019   Dyslipidemia 05/11/2019   Abnormal LFTs 09/12/2018   Medication management 09/12/2018   S/P cervical spinal fusion 01/12/2018   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/2018   Carpal tunnel syndrome of left wrist 12/10/2017   Cervical radiculopathy 11/11/2017   History of fusion of cervical spine 10/22/2017   Chronic pancreatitis (HCC) 10/28/2015   Cramps, extremity 06/25/2015   Diabetes mellitus type 2, uncontrolled, without complications 06/25/2015   Hand cramps 01/25/2015   Hx of acute pancreatitis 01/03/2014   Tobacco dependence 01/03/2014   Other and unspecified hyperlipidemia 01/03/2014   GERD (gastroesophageal reflux disease) 11/22/2013   Essential hypertension, benign 10/25/2013   Leukocytosis, unspecified 10/25/2013   Home Medication(s) Prior to Admission medications   Medication Sig Start Date End Date Taking? Authorizing Provider  albuterol (VENTOLIN HFA) 108 (90 Base) MCG/ACT inhaler Inhale 2 puffs into the lungs every 4 (four) hours as needed for wheezing or shortness of breath. 12/23/22   Nelwyn Salisbury, MD  atorvastatin (LIPITOR) 40 MG tablet Take 1 tablet (40 mg total) by mouth daily. 07/03/21   Shon Hale, MD  B COMPLEX-C-FOLIC ACID PO Take 0.8 mg by mouth daily.    [provider]  BD PEN NEEDLE NANO 2ND GEN 32G X 4 MM MISC USE AS DIRECTED Patient not taking: Reported on 02/24/2023 12/15/22   Nelwyn Salisbury, MD  benazepril (LOTENSIN) 10 MG tablet TAKE 1 TABLET BY MOUTH EVERY DAY 01/13/23   Worthy Rancher B, FNP  Continuous Blood Gluc Sensor (DEXCOM G6 SENSOR) MISC 1 Device by Does not apply route as directed. 07/03/21   Shon Hale, MD  Continuous Blood Gluc Transmit (DEXCOM G6 TRANSMITTER) MISC 1 Device by Does not apply  route as directed. 07/03/21   Shon Hale, MD  Cyanocobalamin (VITAMIN B-12 PO) Take 1 tablet by mouth daily.    [provider]  diphenoxylate-atropine (LOMOTIL) 2.5-0.025 MG tablet Take 1 tablet by mouth 3 (three) times daily as needed for diarrhea or loose stools. 03/05/23   Letta Median, PA-C  empagliflozin (JARDIANCE) 25 MG TABS tablet Take 1 tablet (25 mg total) by mouth daily before breakfast. 10/09/22   Nelwyn Salisbury, MD  folic acid (FOLVITE) 1 MG tablet Take 1 tablet (1 mg total) by mouth daily. 04/27/22   Panosh, Neta Mends, MD  furosemide (LASIX) 20 MG tablet Take 2 tablets (40 mg total) by mouth 2 (two) times daily. 12/31/22   Arrien, York Ram, MD  gabapentin (NEURONTIN) 300 MG capsule Take 2 capsules (600 mg total) by mouth 3 (three) times daily. 10/09/22   Nelwyn Salisbury, MD  lipase/protease/amylase (CREON) 36000 UNITS CPEP capsule Take 2 capsules (72,000 Units total) by mouth 3 (three) times daily with meals. May also take 1 capsule (36,000 Units total) as needed (with snacks). 02/24/23   Letta Median, PA-C  metoprolol tartrate (LOPRESSOR) 25 MG tablet Take 0.5 tablets (12.5 mg total) by mouth 2 (two) times daily. 12/31/22 02/24/23  Arrien, York Ram, MD  Multiple Vitamins-Minerals (ONE-A-DAY MENS, MINERALS, PO) Take 1 tablet by mouth daily.    [provider]  potassium chloride SA (KLOR-CON M) 10 MEQ tablet Take 1 tablet (10 mEq total) by mouth daily. 12/31/22 02/24/23  Arrien, York Ram, MD  spironolactone (ALDACTONE) 50 MG tablet Take 1 tablet (50 mg total) by mouth daily. 01/01/23 02/24/23  Arrien, York Ram, MD  thiamine 100 MG tablet Take 1 tablet (100 mg total) by mouth daily. Patient taking differently: Take 200 mg by mouth 3 (three) times daily. 04/27/22   Panosh, Neta Mends, MD  traZODone (DESYREL) 50 MG tablet TAKE 1 TABLET BY MOUTH EVERYDAY AT BEDTIME 01/13/23   Worthy Rancher B, FNP  TRESIBA FLEXTOUCH 100 UNIT/ML FlexTouch Pen INJECT 30  UNITS INTO THE SKIN DAILY 01/19/23   Eulis Foster, FNP                                                                                                                                    Past Surgical History Past Surgical History:  Procedure Laterality Date   ANTERIOR CERVICAL DECOMP/DISCECTOMY FUSION N/A 01/12/2018   Procedure: Revision ACDF C4-5, removal of hardware C5-6, exploration of fusion C5-6;  Surgeon: Venita Lick, MD;  Location: [facility] OR;  Service: Orthopedics;  Laterality: N/A;  3.5  hrs   BIOPSY  05/13/2020   Procedure: BIOPSY;  Surgeon: Lanelle Bal, DO;  Location: [facility] ENDO SUITE;  Service: Endoscopy;;   BIOPSY  09/23/2020   Procedure: BIOPSY;  Surgeon: Lanelle Bal, DO;  Location: [facility] SUITE;  Service: Endoscopy;;   COLONOSCOPY WITH PROPOFOL N/A 05/13/2020   Dr. Marletta Lor: Nonbleeding internal hemorrhoids.  Few small mouth diverticula in the sigmoid colon.  2 cecal polyps, inflammatory.  Sigmoid colon polyp hyperplastic.  Localized area of granular mucosa in the transverse colon, status post biopsy which was unremarkable.  No microscopic colitis.  Plans for repeat colonoscopy in 5 years.   ESOPHAGEAL BRUSHING  05/13/2020   Procedure: ESOPHAGEAL BRUSHING;  Surgeon: Lanelle Bal, DO;  Location: [facility];  Service: Endoscopy;;  mid esophagus   ESOPHAGOGASTRODUODENOSCOPY (EGD) WITH PROPOFOL N/A 05/13/2020   Dr. Marletta Lor: Moderately severe esophagitis with no bleeding found in the middle third of the esophagus, suspicious for Candida esophagitis.  KOH was negative. Gastritis, duodenal polyp.  Duodenal biopsy benign.  Duodenal bulb biopsy with Brunner's gland hyperplasia and mild changes suggestive of peptic injury.  No celiac disease seen.  GE junction biopsy showed GERD with mild inflammation consisten   ESOPHAGOGASTRODUODENOSCOPY (EGD) WITH PROPOFOL N/A 09/23/2020   Surgeon: Earnest Bailey K, DO;  irregular Z-line, gastritis, normal duodenum.    ESOPHAGOGASTRODUODENOSCOPY (EGD) WITH PROPOFOL N/A 04/17/2022   Surgeon: Corbin Ade, MD; gastropathy otherwise unremarkable.   NECK SURGERY     2 disc   POLYPECTOMY  05/13/2020   Procedure: POLYPECTOMY;  Surgeon: Lanelle Bal, DO;  Location: [facility];  Service: Endoscopy;;   SPINAL FUSION     Family History Family History  Problem Relation Age of Onset   Diabetes Mother    Hypertension Mother    Diabetes Father    Hypertension Father    Arthritis Maternal Grandmother    Hypertension Maternal Grandfather    Diabetes Maternal Grandfather    Colon cancer Neg Hx    Pancreatitis Neg Hx    Pancreatic cancer Neg Hx     Social History Social History   Tobacco Use   Smoking status: Every Day    Packs/day: 1.00    Years: 12.00    Additional pack years: 0.00    Total pack years: 12.00    Types: Cigarettes    Passive exposure: Current   Smokeless tobacco: Never  Vaping Use   Vaping Use: Never used  Substance Use Topics   Alcohol use: Not Currently    Comment: None since March 2024.   Drug use: No   Allergies Patient has no known allergies.  Review of Systems Review of Systems  Constitutional:  Positive for fatigue.  Respiratory:  Positive for shortness of breath.   Gastrointestinal:  Positive for diarrhea.    Physical Exam Vital Signs  I have reviewed the triage vital signs BP (!) 157/100   Pulse 90   Temp 99 F (37.2 C)   Resp 13   Ht 6\' 2"  (1.88 m)   Wt 56.2 kg   SpO2 99%   BMI 15.92 kg/m   Physical Exam Constitutional:      General: He is not in acute distress.    Appearance: Normal appearance. He is ill-appearing.  HENT:     Head: Normocephalic and atraumatic.     Nose: No congestion or rhinorrhea.  Eyes:     General:        Right eye: No discharge.  Left eye: No discharge.     Extraocular Movements: Extraocular movements intact.     Pupils: Pupils are equal, round, and reactive to light.  Cardiovascular:     Rate and  Rhythm: Regular rhythm. Tachycardia present.     Heart sounds: No murmur heard. Pulmonary:     Effort: No respiratory distress.     Breath sounds: No wheezing or rales.  Abdominal:     General: There is no distension.     Tenderness: There is no abdominal tenderness.  Musculoskeletal:        General: Normal range of motion.     Cervical back: Normal range of motion.  Skin:    General: Skin is warm and dry.  Neurological:     General: No focal deficit present.     Mental Status: He is alert.     ED Results and Treatments Labs (all labs ordered are listed, but only abnormal results are displayed) Labs Reviewed  COMPREHENSIVE METABOLIC PANEL - Abnormal; Notable for the following components:      Result Value   Sodium 126 (*)    Chloride 92 (*)    CO2 18 (*)    Glucose, Bld 472 (*)    BUN 30 (*)    Calcium 11.0 (*)    Total Protein 9.3 (*)    AST 13 (*)    Total Bilirubin 1.7 (*)    Anion gap 16 (*)    All other components within normal limits  BLOOD GAS, VENOUS - Abnormal; Notable for the following components:   pCO2, Ven 34 (*)    Bicarbonate 17.1 (*)    Acid-base deficit 8.2 (*)    All other components within normal limits  BETA-HYDROXYBUTYRIC ACID - Abnormal; Notable for the following components:   Beta-Hydroxybutyric Acid 4.68 (*)    All other components within normal limits  BASIC METABOLIC PANEL - Abnormal; Notable for the following components:   Sodium 128 (*)    CO2 17 (*)    Glucose, Bld 224 (*)    BUN 26 (*)    All other components within normal limits  CBG MONITORING, ED - Abnormal; Notable for the following components:   Glucose-Capillary 486 (*)    All other components within normal limits  CBG MONITORING, ED - Abnormal; Notable for the following components:   Glucose-Capillary 435 (*)    All other components within normal limits  CBG MONITORING, ED - Abnormal; Notable for the following components:   Glucose-Capillary 328 (*)    All other components  within normal limits  CULTURE, BLOOD (ROUTINE X 2)  CULTURE, BLOOD (ROUTINE X 2)  LACTIC ACID, PLASMA  CBC WITH DIFFERENTIAL/PLATELET  LIPASE, BLOOD                                                                                                                          Radiology DG Chest Port 1 View  Result Date: 03/29/2023 CLINICAL DATA:  Shortness of breath  EXAM: PORTABLE CHEST 1 VIEW COMPARISON:  CT and radiograph 12/27/2022 FINDINGS: The heart size and mediastinal contours are within normal limits. Both lungs are clear. The visualized skeletal structures are unremarkable. IMPRESSION: No active disease. Electronically Signed   By: Minerva Fester M.D.   On: 03/29/2023 20:09    Pertinent labs & imaging results that were available during my care of the patient were reviewed by me and considered in my medical decision making (see MDM for details).  Medications Ordered in ED Medications  lactated ringers bolus 1,000 mL (0 mLs Intravenous Stopped 03/29/23 2246)  insulin aspart (novoLOG) injection 10 Units (10 Units Intravenous Given 03/29/23 2134)  lactated ringers bolus 1,000 mL (0 mLs Intravenous Stopped 03/29/23 2338)                                                                                                                                     Procedures .Critical Care  Performed by: Glendora Score, MD Authorized by: Glendora Score, MD   Critical care provider statement:    Critical care time (minutes):  30   Critical care was necessary to treat or prevent imminent or life-threatening deterioration of the following conditions:  Endocrine crisis   Critical care was time spent personally by me on the following activities:  Development of treatment plan with patient or surrogate, discussions with consultants, evaluation of patient's response to treatment, examination of patient, ordering and review of laboratory studies, ordering and review of radiographic studies, ordering and  performing treatments and interventions, pulse oximetry, re-evaluation of patient's condition and review of old charts   (including critical care time)  Medical Decision Making / ED Course   This patient presents to the ED for concern of shortness of breath, fatigue, hyperglycemia, this involves an extensive number of treatment options, and is a complaint that carries with it a high risk of complications and morbidity.  The differential diagnosis includes DKA, HHS, stress hyperglycemia, electrolyte abnormality, dehydration  MDM: Patient seen emergency room for evaluation of shortness of breath and generalized fatigue in the setting of severe hyperglycemia.  Physical exam reveals a cachectic ill-appearing patient with tachycardia but is otherwise unremarkable.  No appreciable rales or wheezing heard on lung exam and patient is not hypoxic.  Initial laboratory evaluation with a CO2 of 18, glucose 472 with an anion gap of 16 and a beta hydroxybutyrate of 4.68.  pH is 7.31 with normal lipase.  With only minimal decreased pH but significant elevated beta-hydroxybutyrate, suspect impending DKA and he was given aggressive fluid resuscitation and 10 units IV insulin.  After 2 L of fluid, patient was able to successfully closed his anion gap and glucose has improved from 472 down to 224.  Corrected sodium 130.  Lactic is normal.  Tachycardia improved.  He states that his symptoms have significant improved and he is requesting help establishing care with an endocrinologist.  I did place a  referral to endocrinology here in [place] and he was given strict return precautions of which she voiced understanding.  At this time with his anion gap closed and symptoms improved, he will be discharged with outpatient endocrinology follow-up and return precautions of which he voiced understanding.    Additional history obtained: -Additional history obtained from multiple family members -External records from outside  source obtained and reviewed including: Chart review including previous notes, labs, imaging, consultation notes   Lab Tests: -I ordered, reviewed, and interpreted labs.   The pertinent results include:   Labs Reviewed  COMPREHENSIVE METABOLIC PANEL - Abnormal; Notable for the following components:      Result Value   Sodium 126 (*)    Chloride 92 (*)    CO2 18 (*)    Glucose, Bld 472 (*)    BUN 30 (*)    Calcium 11.0 (*)    Total Protein 9.3 (*)    AST 13 (*)    Total Bilirubin 1.7 (*)    Anion gap 16 (*)    All other components within normal limits  BLOOD GAS, VENOUS - Abnormal; Notable for the following components:   pCO2, Ven 34 (*)    Bicarbonate 17.1 (*)    Acid-base deficit 8.2 (*)    All other components within normal limits  BETA-HYDROXYBUTYRIC ACID - Abnormal; Notable for the following components:   Beta-Hydroxybutyric Acid 4.68 (*)    All other components within normal limits  BASIC METABOLIC PANEL - Abnormal; Notable for the following components:   Sodium 128 (*)    CO2 17 (*)    Glucose, Bld 224 (*)    BUN 26 (*)    All other components within normal limits  CBG MONITORING, ED - Abnormal; Notable for the following components:   Glucose-Capillary 486 (*)    All other components within normal limits  CBG MONITORING, ED - Abnormal; Notable for the following components:   Glucose-Capillary 435 (*)    All other components within normal limits  CBG MONITORING, ED - Abnormal; Notable for the following components:   Glucose-Capillary 328 (*)    All other components within normal limits  CULTURE, BLOOD (ROUTINE X 2)  CULTURE, BLOOD (ROUTINE X 2)  LACTIC ACID, PLASMA  CBC WITH DIFFERENTIAL/PLATELET  LIPASE, BLOOD      EKG   EKG Interpretation  Date/Time:    Ventricular Rate:    PR Interval:    QRS Duration:   QT Interval:    QTC Calculation:   R Axis:     Text Interpretation:           Imaging Studies ordered: I ordered imaging studies including  chest x-ray I independently visualized and interpreted imaging. I agree with the radiologist interpretation   Medicines ordered and prescription drug management: Meds ordered this encounter  Medications   lactated ringers bolus 1,000 mL   insulin aspart (novoLOG) injection 10 Units   lactated ringers bolus 1,000 mL    -I have reviewed the patients home medicines and have made adjustments as needed  Critical interventions Fluid resuscitation, insulin    Cardiac Monitoring: The patient was maintained on a cardiac monitor.  I personally viewed and interpreted the cardiac monitored which showed an underlying rhythm of: Sinus tachycardia, NSR  Social Determinants of Health:  Factors impacting patients care include: Does not have a current endocrinologist   Reevaluation: After the interventions noted above, I reevaluated the patient and found that they have :improved  Co morbidities  that complicate the patient evaluation  Past Medical History:  Diagnosis Date   Acute pancreatitis 10/25/2013   Arthritis    Cirrhosis (HCC)    Diabetes mellitus without complication (HCC)    DKA (diabetic ketoacidosis) (HCC) 06/30/2021   DKA, type 2 (HCC) 07/01/2021   GERD (gastroesophageal reflux disease)    Hepatic steatosis    History of pancreatitis    Hypertension    Myalgia and myositis 04/21/2014   much better from illness presumed infectious    has disability form  will complete     Pancreatic pseudocyst       Dispostion: I considered admission for this patient, with anion gap closed and symptoms improved she is safe for discharge with outpatient follow-up and return precautions     Final Clinical Impression(s) / ED Diagnoses Final diagnoses:  Hyperglycemia  Ketonemia     @PCDICTATION @    Glendora Score, MD 03/30/23 1539

## 2023-04-01 ENCOUNTER — Telehealth: Payer: Self-pay | Admitting: Gastroenterology

## 2023-04-01 LAB — CULTURE, BLOOD (ROUTINE X 2): Culture: NO GROWTH

## 2023-04-01 NOTE — Telephone Encounter (Signed)
Please reach this patient to arrange follow-up in 1-2 weeks.

## 2023-04-02 ENCOUNTER — Other Ambulatory Visit: Payer: Self-pay | Admitting: Gastroenterology

## 2023-04-02 DIAGNOSIS — K7031 Alcoholic cirrhosis of liver with ascites: Secondary | ICD-10-CM

## 2023-04-02 LAB — CULTURE, BLOOD (ROUTINE X 2): Special Requests: ADEQUATE

## 2023-04-02 MED ORDER — SPIRONOLACTONE 50 MG PO TABS
50.0000 mg | ORAL_TABLET | Freq: Every day | ORAL | 3 refills | Status: DC
Start: 2023-04-02 — End: 2023-07-01

## 2023-04-02 MED ORDER — FUROSEMIDE 20 MG PO TABS
40.0000 mg | ORAL_TABLET | Freq: Every day | ORAL | 3 refills | Status: DC
Start: 2023-04-02 — End: 2023-07-01

## 2023-04-03 LAB — CULTURE, BLOOD (ROUTINE X 2): Special Requests: ADEQUATE

## 2023-04-07 ENCOUNTER — Ambulatory Visit: Payer: Medicare Other | Admitting: Internal Medicine

## 2023-04-11 ENCOUNTER — Other Ambulatory Visit: Payer: Self-pay | Admitting: Family

## 2023-04-13 NOTE — Progress Notes (Deleted)
Referring Provider: Madelin Headings, MD Primary Care Physician:  Madelin Headings, MD Primary GI Physician: Dr. Bonnetta Barry chief complaint on file.   HPI:   Todd Rodriguez is a 47 y.o. 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 Blood sugar was high at 565. Additional abnormalities include low sodium at 127, elevated alkaline phosphatase at 229, low iron at 37 and low iron saturation at 12%, ferritin which represents your iron stores was actually normal.    Hemoglobin, platelets, kidney function, platelets, and other liver enzymes were normal.   Due to low sodium, we need to decrease fluid pills and recheck these levels.  I recommend decreasing Lasix to 40 mg once a day and continuing spironolactone 50 mg daily.  I would like for him to go ahead and repeat a BMP   Past Medical History:  Diagnosis Date   Acute pancreatitis 10/25/2013   Arthritis    Cirrhosis (HCC)    Diabetes mellitus without complication (HCC)    DKA (diabetic ketoacidosis) (HCC) 06/30/2021   DKA, type 2 (HCC) 07/01/2021   GERD (gastroesophageal reflux disease)    Hepatic steatosis    History of pancreatitis    Hypertension    Myalgia and myositis 04/21/2014   much better from illness presumed infectious    has disability form  will complete     Pancreatic pseudocyst     Past Surgical History:  Procedure Laterality Date   ANTERIOR CERVICAL DECOMP/DISCECTOMY FUSION N/A 01/12/2018   Procedure: Revision ACDF C4-5, removal of hardware C5-6, exploration of fusion C5-6;  Surgeon: Venita Lick, MD;  Location: [facility] OR;  Service: Orthopedics;  Laterality: N/A;  3.5  hrs   BIOPSY  05/13/2020   Procedure: BIOPSY;  Surgeon: Lanelle Bal, DO;  Location: [facility];  Service: Endoscopy;;   BIOPSY  09/23/2020   Procedure: BIOPSY;  Surgeon: Lanelle Bal, DO;  Location: [facility];  Service: Endoscopy;;   COLONOSCOPY WITH PROPOFOL N/A 05/13/2020   Dr. Marletta Lor: Nonbleeding internal hemorrhoids.  Few small mouth diverticula in the sigmoid colon.  2 cecal polyps, inflammatory.  Sigmoid colon polyp hyperplastic.  Localized area of granular mucosa in the transverse colon, status post biopsy which was unremarkable.  No microscopic colitis.  Plans for repeat colonoscopy in 5 years.   ESOPHAGEAL BRUSHING  05/13/2020   Procedure: ESOPHAGEAL BRUSHING;  Surgeon: Lanelle Bal, DO;  Location: [facility];  Service: Endoscopy;;  mid esophagus   ESOPHAGOGASTRODUODENOSCOPY (EGD) WITH PROPOFOL N/A 05/13/2020   Dr. Marletta Lor: Moderately severe esophagitis with no bleeding found in the middle third of the esophagus, suspicious for Candida esophagitis.  KOH was negative. Gastritis, duodenal polyp.  Duodenal biopsy benign.  Duodenal bulb biopsy with Brunner's gland hyperplasia and mild changes suggestive of peptic injury.  No celiac disease seen.  GE junction biopsy showed GERD with mild inflammation consisten   ESOPHAGOGASTRODUODENOSCOPY (EGD) WITH PROPOFOL N/A 09/23/2020   Surgeon: Earnest Bailey K, DO;  irregular Z-line, gastritis, normal duodenum.   ESOPHAGOGASTRODUODENOSCOPY (EGD) WITH PROPOFOL N/A 04/17/2022   Surgeon: Corbin Ade, MD; gastropathy otherwise unremarkable.   NECK SURGERY     2 disc  POLYPECTOMY  05/13/2020   Procedure: POLYPECTOMY;  Surgeon: Lanelle Bal, DO;  Location: [facility];  Service: Endoscopy;;   SPINAL FUSION      Current Outpatient Medications  Medication Sig Dispense Refill   albuterol (VENTOLIN HFA) 108 (90 Base) MCG/ACT inhaler Inhale 2 puffs into the lungs every 4 (four) hours as needed for wheezing or shortness of  breath. 18 g 1   atorvastatin (LIPITOR) 40 MG tablet Take 1 tablet (40 mg total) by mouth daily. 90 tablet 1   B COMPLEX-C-FOLIC ACID PO Take 0.8 mg by mouth daily.     BD PEN NEEDLE NANO 2ND GEN 32G X 4 MM MISC USE AS DIRECTED (Patient not taking: Reported on 02/24/2023) 100 each 0   benazepril (LOTENSIN) 10 MG tablet TAKE 1 TABLET BY MOUTH EVERY DAY 90 tablet 0   Continuous Blood Gluc Sensor (DEXCOM G6 SENSOR) MISC 1 Device by Does not apply route as directed. 9 each 3   Continuous Blood Gluc Transmit (DEXCOM G6 TRANSMITTER) MISC 1 Device by Does not apply route as directed. 1 each 3   Cyanocobalamin (VITAMIN B-12 PO) Take 1 tablet by mouth daily.     diphenoxylate-atropine (LOMOTIL) 2.5-0.025 MG tablet Take 1 tablet by mouth 3 (three) times daily as needed for diarrhea or loose stools. 90 tablet 3   empagliflozin (JARDIANCE) 25 MG TABS tablet Take 1 tablet (25 mg total) by mouth daily before breakfast. 90 tablet 0   folic acid (FOLVITE) 1 MG tablet Take 1 tablet (1 mg total) by mouth daily. 90 tablet 3   furosemide (LASIX) 20 MG tablet Take 2 tablets (40 mg total) by mouth daily. 60 tablet 3   gabapentin (NEURONTIN) 300 MG capsule Take 2 capsules (600 mg total) by mouth 3 (three) times daily. 180 capsule 0   lipase/protease/amylase (CREON) 36000 UNITS CPEP capsule Take 2 capsules (72,000 Units total) by mouth 3 (three) times daily with meals. May also take 1 capsule (36,000 Units total) as needed (with snacks). 240 capsule 5   metoprolol tartrate (LOPRESSOR) 25 MG tablet Take 0.5 tablets (12.5 mg total) by mouth 2 (two) times daily. 30 tablet 0   Multiple Vitamins-Minerals (ONE-A-DAY MENS, MINERALS, PO) Take 1 tablet by mouth daily.     potassium chloride SA (KLOR-CON M) 10 MEQ tablet Take 1 tablet (10 mEq total) by mouth daily. 30 tablet 0   spironolactone (ALDACTONE) 50 MG tablet Take 1 tablet (50 mg total) by mouth daily. 30 tablet 3   thiamine 100 MG tablet Take 1 tablet (100 mg total) by  mouth daily. (Patient taking differently: Take 200 mg by mouth 3 (three) times daily.) 90 tablet 3   traZODone (DESYREL) 50 MG tablet TAKE 1 TABLET BY MOUTH EVERYDAY AT BEDTIME 90 tablet 0   TRESIBA FLEXTOUCH 100 UNIT/ML FlexTouch Pen INJECT 30 UNITS INTO THE SKIN DAILY 10 mL 5   No current facility-administered medications for this visit.    Allergies as of 04/15/2023   (No Known Allergies)    Family History  Problem Relation Age of Onset   Diabetes Mother    Hypertension Mother    Diabetes Father    Hypertension Father    Arthritis Maternal Grandmother    Hypertension Maternal Grandfather    Diabetes Maternal Grandfather    Colon cancer Neg Hx    Pancreatitis Neg Hx    Pancreatic cancer Neg Hx     Social History   Socioeconomic History   Marital status: Married  Spouse name: Not on file   Number of children: 1   Years of education: Not on file   Highest education level: Not on file  Occupational History    Employer: [employer]  Tobacco Use   Smoking status: Every Day    Packs/day: 1.00    Years: 12.00    Additional pack years: 0.00    Total pack years: 12.00    Types: Cigarettes    Passive exposure: Current   Smokeless tobacco: Never  Vaping Use   Vaping Use: Never used  Substance and Sexual Activity   Alcohol use: Not Currently    Comment: None since March 2024.   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             Social Determinants of Health   Financial Resource Strain: Low Risk  (06/15/2022)   Overall Financial Resource Strain (CARDIA)    Difficulty of Paying Living Expenses: Not  hard at all  Food Insecurity: No Food Insecurity (01/19/2023)   Hunger Vital Sign    Worried About Running Out of Food in the Last Year: Never true    Ran Out of Food in the Last Year: Never true  Transportation Needs: No Transportation Needs (01/19/2023)   PRAPARE - Administrator, Civil Service (Medical): No    Lack of Transportation (Non-Medical): No  Physical Activity: Sufficiently Active (01/19/2023)   Exercise Vital Sign    Days of Exercise per Week: 5 days    Minutes of Exercise per Session: 30 min  Stress: No Stress Concern Present (01/19/2023)   [employer] of Occupational Health - Occupational Stress Questionnaire    Feeling of Stress : Not at all  Social Connections: Socially Integrated (06/15/2022)   Social Connection and Isolation Panel [NHANES]    Frequency of Communication with Friends and Family: More than three times a week    Frequency of Social Gatherings with Friends and Family: More than three times a week    Attends Religious Services: More than 4 times per year    Active Member of [employer] or Organizations: Yes    Attends Engineer, structural: More than 4 times per year    Marital Status: Married    Review of Systems: Gen: Denies fever, chills, anorexia. Denies fatigue, weakness, weight loss.  CV: Denies chest pain, palpitations, syncope, peripheral edema, and claudication. Resp: Denies dyspnea at rest, cough, wheezing, coughing up blood, and pleurisy. GI: Denies vomiting blood, jaundice, and fecal incontinence.   Denies dysphagia or odynophagia. Derm: Denies rash, itching, dry skin Psych: Denies depression, anxiety, memory loss, confusion. No homicidal or suicidal ideation.  Heme: Denies bruising, bleeding, and enlarged lymph nodes.  Physical Exam: There were no vitals taken for this visit. General:   Alert and oriented. No distress noted. Pleasant and cooperative.  Head:  Normocephalic and atraumatic. Eyes:  Conjuctiva clear without  scleral icterus. Heart:  S1, S2 present without murmurs appreciated. Lungs:  Clear to auscultation bilaterally.  No wheezes, rales, or rhonchi. No distress.  Abdomen:  +BS, soft, non-tender and non-distended. No rebound or guarding. No HSM or masses noted. Msk:  Symmetrical without gross deformities. Normal posture. Extremities:  Without edema. Neurologic:  Alert and  oriented x4 Psych:  Normal mood and affect.    Assessment:     Plan:  ***   Ermalinda Memos, PA-C [facility] 04/15/2023

## 2023-04-14 NOTE — Telephone Encounter (Signed)
Todd Rodriguez, patient has requested to cancel appt for 7/11. He will call back to reschedule when he is ready.

## 2023-04-15 ENCOUNTER — Ambulatory Visit: Payer: Medicare Other | Admitting: Gastroenterology

## 2023-04-20 ENCOUNTER — Other Ambulatory Visit: Payer: Self-pay | Admitting: Family Medicine

## 2023-04-27 ENCOUNTER — Ambulatory Visit: Payer: Medicare Other | Admitting: Internal Medicine

## 2023-04-27 ENCOUNTER — Encounter: Payer: Self-pay | Admitting: Internal Medicine

## 2023-04-27 VITALS — BP 108/78 | HR 112 | Temp 98.3°F | Wt 131.8 lb

## 2023-04-27 DIAGNOSIS — E785 Hyperlipidemia, unspecified: Secondary | ICD-10-CM

## 2023-04-27 DIAGNOSIS — Z79899 Other long term (current) drug therapy: Secondary | ICD-10-CM

## 2023-04-27 DIAGNOSIS — E1169 Type 2 diabetes mellitus with other specified complication: Secondary | ICD-10-CM | POA: Diagnosis not present

## 2023-04-27 DIAGNOSIS — E1165 Type 2 diabetes mellitus with hyperglycemia: Secondary | ICD-10-CM | POA: Diagnosis not present

## 2023-04-27 DIAGNOSIS — Z794 Long term (current) use of insulin: Secondary | ICD-10-CM

## 2023-04-27 DIAGNOSIS — K7031 Alcoholic cirrhosis of liver with ascites: Secondary | ICD-10-CM | POA: Diagnosis not present

## 2023-04-27 LAB — POCT GLYCOSYLATED HEMOGLOBIN (HGB A1C): Hemoglobin A1C: 10.6 % — AB (ref 4.0–5.6)

## 2023-04-27 MED ORDER — POTASSIUM CHLORIDE CRYS ER 10 MEQ PO TBCR: 10.0000 meq | EXTENDED_RELEASE_TABLET | Freq: Every day | ORAL | 1 refills | Status: DC

## 2023-04-27 MED ORDER — GABAPENTIN 300 MG PO CAPS: 600.0000 mg | ORAL_CAPSULE | Freq: Three times a day (TID) | ORAL | 6 refills | Status: DC

## 2023-04-27 NOTE — Patient Instructions (Addendum)
Refilled potassium  and gabapentin today.  If bg goes 400 and above take 2-4 units of short acting insulin.   Tomorrow try to in tresiba to  26 units .    Specialist may decide to change your insulin   If getting dehydrated  stop the jardiance temporarily.   Continue good nutrition  efforts.  ROV after endocrine evaluation.  2 units with meal or 400 over since trying to avoid  low bg

## 2023-04-27 NOTE — Progress Notes (Signed)
Chief Complaint  Patient presents with   Medical Management of Chronic Issues    HPI: Todd Rodriguez 47 y.o. come in for Chronic disease management   Complcated disease lives in [place]    alcohol related med problems  including pancreatitis and   Etoh cirrhsis and more recent episode of impending DKA.  Stopped etoh for a while  but on his birthday brother-in-law brought some alcohol and he  drink some too much  which he feels sent him to the hospital.  Plans no more alcohol since  this mishap since then Question about Librium on med list was given to him by other?  States sleep is currently okay if he takes a trazodone.  To ed with hyperglycemia  sob impending dka 6 24. Bg 472   With elevated Bhbutyrate  corrected sodium 130  He is on gabapentin 300 x 2 3 times a day since his neck surgery.  He does have some tingling in his hands and feet. Needs refill of potassium he is taking 1 a day potassium in the ED was 3.5. Stated he did have some low sodium when in the hospital felt corrected to 130. He is also on spironolactone.  Followed by the GI team. No current excess swelling.  In regard to his diabetes he finally has an appointment with endocrinology but that changed from August to September 9 in [place] which is much closer to his home. He has a Dexcom monitor and has been on [place] 30 units a day with 6 units per meal of immediate release short acting insulin.  However he was getting low blood sugars he felt waking in the morning with 70s and the alarm on his phone going off.  Dr. Clent Ridges had seen him and advised he go well with the short acting insulin but he also went down on the basal.  He is only been taking 24 units a day felt his blood sugars were pretty good then but were very high when he went to the hospital a few weeks ago in the 400s.  He states that now it is about 300 but is fearful that it will go down to 70 in the morning.  He takes all his medicines and his  insulin in the morning.  ROS: See pertinent positives and negatives per HPI.  Past Medical History:  Diagnosis Date   Acute pancreatitis 10/25/2013   Arthritis    Cirrhosis (HCC)    Diabetes mellitus without complication (HCC)    DKA (diabetic ketoacidosis) (HCC) 06/30/2021   DKA, type 2 (HCC) 07/01/2021   GERD (gastroesophageal reflux disease)    Hepatic steatosis    History of pancreatitis    Hypertension    Myalgia and myositis 04/21/2014   much better from illness presumed infectious    has disability form  will complete     Pancreatic pseudocyst     Family History  Problem Relation Age of Onset   Diabetes Mother    Hypertension Mother    Diabetes Father    Hypertension Father    Arthritis Maternal Grandmother    Hypertension Maternal Grandfather    Diabetes Maternal Grandfather    Colon cancer Neg Hx    Pancreatitis Neg Hx    Pancreatic cancer Neg Hx     Social History   Socioeconomic History   Marital status: Married    Spouse name: Not on file   Number of children: 1   Years of education: Not on file  Highest education level: Not on file  Occupational History    Employer: [employer]  Tobacco Use   Smoking status: Every Day    Current packs/day: 1.00    Average packs/day: 1 pack/day for 12.0 years (12.0 ttl pk-yrs)    Types: Cigarettes    Passive exposure: Current   Smokeless tobacco: Never  Vaping Use   Vaping status: Never Used  Substance and Sexual Activity   Alcohol use: Not Currently    Comment: None since March 2024.   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              Social Determinants of Health   Financial Resource Strain: Low Risk  (06/15/2022)   Overall Financial Resource Strain (CARDIA)    Difficulty of Paying Living Expenses: Not hard at all  Food Insecurity: No Food Insecurity (01/19/2023)   Hunger Vital Sign    Worried About Running Out of Food in the Last Year: Never true    Ran Out of Food in the Last Year: Never true  Transportation Needs: No Transportation Needs (01/19/2023)   PRAPARE - Administrator, Civil Service (Medical): No    Lack of Transportation (Non-Medical): No  Physical Activity: Sufficiently Active (01/19/2023)   Exercise Vital Sign    Days of Exercise per Week: 5 days    Minutes of Exercise per Session: 30 min  Stress: No Stress Concern Present (01/19/2023)   [employer] of Occupational Health - Occupational Stress Questionnaire    Feeling of Stress : Not at all  Social Connections: Socially Integrated (06/15/2022)   Social Connection and Isolation Panel [NHANES]    Frequency of Communication with Friends and Family: More than three times a week    Frequency of Social Gatherings with Friends and Family: More than three times a week    Attends Religious Services: More than 4 times per year    Active Member of [employer] or Organizations: Yes    Attends Engineer, structural: More than 4 times per year    Marital Status: Married    Outpatient Medications Prior to Visit  Medication Sig Dispense Refill   albuterol (VENTOLIN HFA) 108 (90 Base) MCG/ACT inhaler Inhale 2 puffs into the lungs every 4 (four) hours as needed for wheezing or shortness of breath. 18 g 1   atorvastatin (LIPITOR) 40 MG tablet Take 1 tablet (40 mg total) by mouth daily. 90 tablet 1   B COMPLEX-C-FOLIC ACID PO Take 0.8 mg by mouth daily.     BD PEN NEEDLE NANO 2ND GEN 32G X 4 MM MISC USE AS DIRECTED 100 each 0   benazepril (LOTENSIN) 10 MG tablet TAKE 1 TABLET BY MOUTH EVERY DAY 90 tablet 0   chlordiazePOXIDE (LIBRIUM) 25 MG  capsule Take 25 mg by mouth. As needed.     Continuous Blood Gluc Sensor (DEXCOM G6 SENSOR) MISC 1 Device by Does not apply route as directed. 9 each 3   Continuous Blood Gluc  Transmit (DEXCOM G6 TRANSMITTER) MISC 1 Device by Does not apply route as directed. 1 each 3   Cyanocobalamin (VITAMIN B-12 PO) Take 1 tablet by mouth daily.     diphenoxylate-atropine (LOMOTIL) 2.5-0.025 MG tablet Take 1 tablet by mouth 3 (three) times daily as needed for diarrhea or loose stools. 90 tablet 3   empagliflozin (JARDIANCE) 25 MG TABS tablet Take 1 tablet (25 mg total) by mouth daily before breakfast. 90 tablet 0   folic acid (FOLVITE) 1 MG tablet Take 1 tablet (1 mg total) by mouth daily. 90 tablet 3   furosemide (LASIX) 20 MG tablet Take 2 tablets (40 mg total) by mouth daily. 60 tablet 3   lipase/protease/amylase (CREON) 36000 UNITS CPEP capsule Take 2 capsules (72,000 Units total) by mouth 3 (three) times daily with meals. May also take 1 capsule (36,000 Units total) as needed (with snacks). 240 capsule 5   Multiple Vitamins-Minerals (ONE-A-DAY MENS, MINERALS, PO) Take 1 tablet by mouth daily.     spironolactone (ALDACTONE) 50 MG tablet Take 1 tablet (50 mg total) by mouth daily. 30 tablet 3   thiamine 100 MG tablet Take 1 tablet (100 mg total) by mouth daily. (Patient taking differently: Take 200 mg by mouth 3 (three) times daily.) 90 tablet 3   traZODone (DESYREL) 50 MG tablet TAKE 1 TABLET BY MOUTH EVERYDAY AT BEDTIME 90 tablet 0   TRESIBA FLEXTOUCH 100 UNIT/ML FlexTouch Pen INJECT 30 UNITS INTO THE SKIN DAILY 10 mL 5   gabapentin (NEURONTIN) 300 MG capsule Take 2 capsules (600 mg total) by mouth 3 (three) times daily. 180 capsule 0   metoprolol tartrate (LOPRESSOR) 25 MG tablet Take 0.5 tablets (12.5 mg total) by mouth 2 (two) times daily. 30 tablet 0   potassium chloride SA (KLOR-CON M) 10 MEQ tablet Take 1 tablet (10 mEq total) by mouth daily. 30 tablet 0   No facility-administered medications prior to  visit.     EXAM:  BP 108/78 (BP Location: Right Arm, Patient Position: Sitting, Cuff Size: Normal)   Pulse (!) 112   Temp 98.3 F (36.8 C) (Oral)   Wt 131 lb 12.8 oz (59.8 kg)   SpO2 96%   BMI 16.92 kg/m   Body mass index is 16.92 kg/m. Wt Readings from Last 3 Encounters:  04/27/23 131 lb 12.8 oz (59.8 kg)  03/29/23 124 lb (56.2 kg)  02/24/23 146 lb 9.6 oz (66.5 kg)    GENERAL: vitals reviewed and listed above, alert, oriented, appears well hydrated and in no acute distress under nourished nonicteric normal speech and alertness HEENT: atraumatic, conjunctiva  clear, no obvious abnormalities on inspection of external nose and ears O NECK: no obvious masses on inspection palpation  LUNGS: clear to auscultation bilaterally, no wheezes, rales or rhonchi,  CV: HRRR, no clubbing cyanosis or  peripheral edema nl cap refill  MS: moves all extremities without noticeable focal  abnormality decreased muscle mass.  Left ring finger mild contracture PSYCH: pleasant and cooperative,  Lab Results  Component Value Date   WBC 5.1 03/29/2023   HGB 16.6 03/29/2023   HCT 50.1 03/29/2023   PLT 182 03/29/2023   GLUCOSE 224 (H) 03/29/2023   CHOL 160 12/23/2020   TRIG 80.0 12/23/2020   HDL 69.60 12/23/2020   LDLDIRECT 193.0 03/21/2019   LDLCALC 74 12/23/2020   ALT 17 03/29/2023   AST 13 (L) 03/29/2023   NA 128 (L) 03/29/2023   K 3.5 03/29/2023   CL 99 03/29/2023  CREATININE 1.00 03/29/2023   BUN 26 (H) 03/29/2023   CO2 17 (L) 03/29/2023   TSH 2.42 12/22/2022   PSA 1.17 12/23/2020   INR 1.1 04/16/2022   HGBA1C 10.6 (A) 04/27/2023   MICROALBUR <0.7 10/18/2019   BP Readings from Last 3 Encounters:  04/27/23 108/78  03/29/23 (!) 157/100  02/24/23 132/83    ASSESSMENT AND PLAN:  Discussed the following assessment and plan:  Type 2 diabetes mellitus with hyperglycemia, with long-term current use of insulin (HCC)  Medication management  Alcoholic cirrhosis of liver with  ascites (HCC)  Type 2 diabetes mellitus with hyperlipidemia (HCC) - Plan: POC HgB A1c Has appt with endo : Sept   9  Dr Alphonzo Lemmings.   unfortunately his A1c went from the 7.2 to 10.6 suspect this was because he decreased his Tresiba from 30 to 24 units and stopped  the immediate release short acting insulin with meals because of a of hypoglycemia at 1 point.  He has been in this regimen for a number of months. Suggest to add back at least 2 units to 4 units of immediate release insulin with meals but can limit the evening 1 to avoid hypoglycemia in the morning.  Will increase his Tresiba to 26 units.   This still may not be enough and maybe his insulin needs to be changed.   But I suspect that decreasing his previous insulin regiment was too great.   Also warned of stopping the Jardiance if any suggestion of dehydration or DKA. Advised  contact us in 2 weeks about how his blood sugars are doing and we can do insulin adjustment   ...virtual visit if needed Will refill gabapentin and potassium today  -Patient advised to return or notify health care team  if  new concerns arise. In interim   Patient Instructions  Refilled potassium  and gabapentin today.  If bg goes 400 and above take 2-4 units of short acting insulin.   Tomorrow try to in tresiba to  26 units .    Specialist may decide to change your insulin   If getting dehydrated  stop the jardiance temporarily.   Continue good nutrition  efforts.  ROV after endocrine evaluation.  2 units with meal or 400 over since trying to avoid  low bg    Burna Mortimer K. Ethelle Ola M.D.

## 2023-05-05 NOTE — Progress Notes (Unsigned)
Referring Provider: Madelin Headings, MD Primary Care Physician:  Madelin Headings, MD Primary GI Physician: Dr. Marletta Lor  Chief Complaint  Patient presents with   Follow-up    Follow up. No problem     HPI:   Todd Rodriguez is a 47 y.o. 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   Last seen in the office 02/24/2023.  No significant ascites, peripheral edema, or encephalopathy.  Reported last alcoholic drink was in March 2024.  He denied overt GI bleeding.  Was no longer taking PPI as GERD resolved since discontinuing alcohol and NSAID use.  He continued with diarrhea having 5 or 6 bowel movements daily, but stating he was more so waking in the middle of the night with bowel movements, currently taking Lomotil 3 times daily.  Suspected component of EPI the setting of chronic pancreatitis versus SIBO versus diabetic enteropathy.  Recommendations included updating labs, RUQ ultrasound, EGD, colonoscopy, start Creon, continue Lomotil, continue current diuretics.   I did receive and reviewed his blood work dated 03/03/2023. Blood sugar was high at 565. Additional abnormalities include low sodium at 127, elevated alkaline phosphatase at 229, low iron at 37 and low iron saturation at 12%, ferritin wnl.  Hemoglobin, platelets, kidney function, liver enzymes, INR, AFP were normal. No immunity to hepatitis A or B.  Due to low sodium, recommended decreasing fluid pills and recheck these levels.  I recommend decreasing Lasix to 40 mg once a day and continuing spironolactone 50 mg daily.   RUQ ultrasound 03/16/2023: No focal liver lesion, gallbladder wall  thickening with gallbladder sludge and gallstones.  No Murphy sign.  Ultimately, procedures were canceled due to hyperglycemia.  Repeat BMP 6/24 with sodium stable at 128.   Today:  Cirrhosis: MELD 3.0: 16 on 03/03/23 [place]: Up-to-date June 2024 with no focal liver lesion. AFP: Within normal limits in May. Hep A/B vaccination: No immunity to hepatitis A or hepatitis B noted on labs 03/03/2023. EGD:  Last 04/17/2022 with evidence of NSAID/EtOH gastropathy otherwise unremarkable.  BB: No Ascites/peripheral edema: None. Diuretics: Patient states he is currently taking Lasix 20 mg twice daily and spironolactone 50 mg daily. Paracentesis: First paracentesis March 2024.  No recurrent paracentesis required. History of SBP: Never. Encephalopathy:   None.  Continues to avoid all alcohol.  Diarrhea:  Improving.  Taking Creon 2 capsules with meals.  Does not take 1 capsule with snacks.  Still having about 5 bowel movements a day, but they are now formed.  Only taking 1 or occasionally 2 Lomotil per day whereas he had previously been taking 3 Lomotil daily.  No BRBPR or melena.  He is still taking iron every day.  Most recent hemoglobin was 14.2 on 5/29.  States he is actually finally gaining some weight back.  He had lost down to 124 pounds but is weighing 138 pounds today.  Blood sugars have improved, but are still not adequately managed.  Current blood glucose is 314.  States he has appointment with endocrinology in September.     Procedure history: EGD 04/17/2022 evidence of gastropathy otherwise unremarkable.   EGD 09/23/2020 irregular Z-line, gastritis, normal duodenum.   EGD 05/13/20 moderately severe esophagitis with no bleeding found in  the middle third of the esophagus, suspicious for Candida esophagitis.  KOH was negative.  He was treated with 2 weeks of Diflucan.  GE junction biopsy showed GERD with mild inflammation consistent with reflux. Duodenal biopsies benign.    Colonoscopy  05/13/2020 for diarrhea, rectal bleeding and weight loss with hyperplastic and inflammatory polyp removed.  Colon biopsies negative.    C diff and GI pathogen panel negative March 2024.    TSH within normal limits in 2023.  Past Medical History:  Diagnosis Date   Acute pancreatitis 10/25/2013   Arthritis    Cirrhosis (HCC)    Diabetes mellitus without complication (HCC)    DKA (diabetic ketoacidosis) (HCC) 06/30/2021   DKA, type 2 (HCC) 07/01/2021   GERD (gastroesophageal reflux disease)    Hepatic steatosis    History of pancreatitis    Hypertension    Myalgia and myositis 04/21/2014   much better from illness presumed infectious    has disability form  will complete     Pancreatic pseudocyst     Past Surgical History:  Procedure Laterality Date   ANTERIOR CERVICAL DECOMP/DISCECTOMY FUSION N/A 01/12/2018   Procedure: Revision ACDF C4-5, removal of hardware C5-6, exploration of fusion C5-6;  Surgeon: Venita Lick, MD;  Location: [facility];  Service: Orthopedics;  Laterality: N/A;  3.5 hrs   BIOPSY  05/13/2020   Procedure: BIOPSY;  Surgeon: Lanelle Bal, DO;  Location: [facility];  Service: Endoscopy;;   BIOPSY  09/23/2020   Procedure: BIOPSY;  Surgeon: Lanelle Bal, DO;  Location: [facility];  Service: Endoscopy;;   COLONOSCOPY WITH PROPOFOL N/A 05/13/2020   Dr. Marletta Lor: Nonbleeding internal hemorrhoids.  Few small mouth diverticula in the sigmoid colon.  2 cecal polyps, inflammatory.  Sigmoid colon polyp hyperplastic.  Localized area of granular mucosa in the transverse colon, status post biopsy which was unremarkable.  No microscopic colitis.  Plans for repeat colonoscopy in 5 years.   ESOPHAGEAL BRUSHING  05/13/2020   Procedure: ESOPHAGEAL BRUSHING;  Surgeon: Lanelle Bal, DO;  [facility]: [facility];  Service: Endoscopy;;  mid esophagus   ESOPHAGOGASTRODUODENOSCOPY (EGD) WITH PROPOFOL N/A 05/13/2020   Dr. Marletta Lor: Moderately severe esophagitis with no  bleeding found in the middle third of the esophagus, suspicious for Candida esophagitis.  KOH was negative. Gastritis, duodenal polyp.  Duodenal biopsy benign.  Duodenal bulb biopsy with Brunner's gland hyperplasia and mild changes suggestive of peptic injury.  No celiac disease seen.  GE junction biopsy showed GERD with mild inflammation consisten   ESOPHAGOGASTRODUODENOSCOPY (EGD) WITH PROPOFOL N/A 09/23/2020   Surgeon: Earnest Bailey K, DO;  irregular Z-line, gastritis, normal duodenum.   ESOPHAGOGASTRODUODENOSCOPY (EGD) WITH PROPOFOL N/A 04/17/2022   Surgeon: Corbin Ade, MD; gastropathy otherwise unremarkable.   NECK SURGERY     2 disc   POLYPECTOMY  05/13/2020   Procedure: POLYPECTOMY;  Surgeon: Lanelle Bal, DO;  [facility]: [facility];  Service: Endoscopy;;   SPINAL FUSION      Current Outpatient Medications  Medication Sig Dispense Refill   albuterol (VENTOLIN HFA) 108 (90 Base) MCG/ACT inhaler Inhale 2 puffs into the lungs every 4 (four) hours as needed for wheezing or shortness of breath. 18 g 1   atorvastatin (LIPITOR) 40 MG tablet Take 1 tablet (40 mg total) by mouth daily. 90 tablet 1   B COMPLEX-C-FOLIC ACID PO Take 0.8 mg by mouth daily.     BD PEN NEEDLE NANO 2ND GEN 32G X 4 MM MISC  USE AS DIRECTED 100 each 0   benazepril (LOTENSIN) 10 MG tablet TAKE 1 TABLET BY MOUTH EVERY DAY 90 tablet 0   chlordiazePOXIDE (LIBRIUM) 25 MG capsule Take 25 mg by mouth. As needed.     Continuous Blood Gluc Sensor (DEXCOM G6 SENSOR) MISC 1 Device by Does not apply route as directed. 9 each 3   Continuous Blood Gluc Transmit (DEXCOM G6 TRANSMITTER) MISC 1 Device by Does not apply route as directed. 1 each 3   Cyanocobalamin (VITAMIN B-12 PO) Take 1 tablet by mouth daily.     diphenoxylate-atropine (LOMOTIL) 2.5-0.025 MG tablet Take 1 tablet by mouth 3 (three) times daily as needed for diarrhea or loose stools. 90 tablet 3   empagliflozin (JARDIANCE) 25 MG TABS tablet Take 1 tablet (25  mg total) by mouth daily before breakfast. 90 tablet 0   folic acid (FOLVITE) 1 MG tablet Take 1 tablet (1 mg total) by mouth daily. 90 tablet 3   furosemide (LASIX) 20 MG tablet Take 2 tablets (40 mg total) by mouth daily. 60 tablet 3   gabapentin (NEURONTIN) 300 MG capsule Take 2 capsules (600 mg total) by mouth 3 (three) times daily. 180 capsule 6   lipase/protease/amylase (CREON) 36000 UNITS CPEP capsule Take 2 capsules (72,000 Units total) by mouth 3 (three) times daily with meals. May also take 1 capsule (36,000 Units total) as needed (with snacks). 240 capsule 5   metoprolol tartrate (LOPRESSOR) 25 MG tablet Take 0.5 tablets (12.5 mg total) by mouth 2 (two) times daily. 30 tablet 0   Multiple Vitamins-Minerals (ONE-A-DAY MENS, MINERALS, PO) Take 1 tablet by mouth daily.     potassium chloride (KLOR-CON M) 10 MEQ tablet Take 1 tablet (10 mEq total) by mouth daily. 90 tablet 1   spironolactone (ALDACTONE) 50 MG tablet Take 1 tablet (50 mg total) by mouth daily. 30 tablet 3   thiamine 100 MG tablet Take 1 tablet (100 mg total) by mouth daily. (Patient taking differently: Take 200 mg by mouth 3 (three) times daily.) 90 tablet 3   traZODone (DESYREL) 50 MG tablet TAKE 1 TABLET BY MOUTH EVERYDAY AT BEDTIME 90 tablet 0   TRESIBA FLEXTOUCH 100 UNIT/ML FlexTouch Pen INJECT 30 UNITS INTO THE SKIN DAILY 10 mL 5   No current facility-administered medications for this visit.    Allergies as of 05/06/2023   (No Known Allergies)    Family History  Problem Relation Age of Onset   Diabetes Mother    Hypertension Mother    Diabetes Father    Hypertension Father    Arthritis Maternal Grandmother    Hypertension Maternal Grandfather    Diabetes Maternal Grandfather    Colon cancer Neg Hx    Pancreatitis Neg Hx    Pancreatic cancer Neg Hx     Social History   Socioeconomic History   Marital status: Married    Spouse name: Not on file   Number of children: 1   Years of education: Not on file    Highest education level: Not on file  Occupational History    Employer: [employer]  Tobacco Use   Smoking status: Every Day    Current packs/day: 1.00    Average packs/day: 1 pack/day for 12.0 years (12.0 ttl pk-yrs)    Types: Cigarettes    Passive exposure: Current   Smokeless tobacco: Never  Vaping Use   Vaping status: Never Used  Substance and Sexual Activity   Alcohol use: Not Currently    Comment:  None since March 2024.   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             Social Determinants of Health   Financial Resource Strain: Low Risk  (06/15/2022)   Overall Financial Resource Strain (CARDIA)    Difficulty of Paying Living Expenses: Not hard at all  Food Insecurity: No Food Insecurity (01/19/2023)   Hunger Vital Sign    Worried About Running Out of Food in the Last Year: Never true    Ran Out of Food in the Last Year: Never true  Transportation Needs: No Transportation Needs (01/19/2023)   PRAPARE - Administrator, Civil Service (Medical): No    Lack of Transportation (Non-Medical): No  Physical Activity: Sufficiently Active (01/19/2023)   Exercise Vital Sign    Days of Exercise per Week: 5 days    Minutes of Exercise per Session: 30 min  Stress: No Stress Concern Present (01/19/2023)   [employer] of Occupational Health - Occupational Stress Questionnaire    Feeling of Stress : Not at all  Social Connections: Socially Integrated (06/15/2022)   Social Connection and Isolation Panel [NHANES]    Frequency of Communication with Friends and Family: More than three times a week     Frequency of Social Gatherings with Friends and Family: More than three times a week    Attends Religious Services: More than 4 times per year    Active Member of [employer] or Organizations: Yes    Attends Engineer, structural: More than 4 times per year    Marital Status: Married    Review of Systems: Gen: Denies fever, chills, cold or flulike symptoms, presyncope, syncope. CV: Denies chest pain, palpitations. Resp: Denies dyspnea, cough. GI: See HPI Heme: See HPI  Physical Exam: BP 131/88 (BP Location: Right Arm, Patient Position: Sitting, Cuff Size: Normal)   Pulse (!) 118   Temp 97.9 F (36.6 C) (Temporal)   Ht 6\' 2"  (1.88 m)   Wt 138 lb (62.6 kg)   BMI 17.72 kg/m  General:   Alert and oriented. No distress noted. Pleasant and cooperative.  Head:  Normocephalic and atraumatic. Eyes:  Conjuctiva clear without scleral icterus. Heart:  S1, S2 present without murmurs appreciated. Lungs:  Clear to auscultation bilaterally. No wheezes, rales, or rhonchi. No distress.  Abdomen:  +BS, soft, non-tender and non-distended. No rebound or guarding. No HSM or masses noted. Msk:  Symmetrical without gross deformities. Normal posture. Extremities:  Without edema. Neurologic:  Alert and  oriented x4 Psych:  Normal mood and affect.    Assessment:  47 y.o. 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  Previously decompensated with anasarca/ascites and required first-ever paracentesis in March during hospitalization.  He did not have SBP.  He is now doing very  well on spironolactone 50 mg daily and Lasix 20 mg twice daily.  Seems that there may have been some confusion about his prior Lasix dosing and today tells me he was taking 40 mg twice daily previously, but decreased to 20 mg twice daily when I called him about hyponatremia in May.  As he continues without ascites or recurrent peripheral edema, I recommended that we can try decreasing Lasix further to 20 mg daily and continuing spironolactone.  Will also plan to repeat BMP to see how his sodium is doing.  No history of hepatic encephalopathy.  He has been abstinent from alcohol since March 2024.  HCC screening up-to-date in June 2024.  aFP within normal limits in May.  EGD July 2023 without varices, but previously recommended updating EGD considering decompensating event with ascites in March.  This has yet to be scheduled due to uncontrolled diabetes.  Will have to continue to hold off on this as his blood sugar today is 314 in office per his meter.  He has an appointment with endocrinology in September.  Diarrhea: Chronic.  Suspect EPI in the setting of chronic pancreatitis. Not consistent with IBS as he has no associated abdominal pain. May also have diabetic enteropathy. Prior evaluation with C diff and GI pathogen panel negative March 2024. Colon biopsies negative for microscopic colitis August 2021. Duodenal biopsies benign August 2021. TSH normal last year.  Started on Creon in May 2024 and has noted improvement.  Still having 5 stools per day, but they are now formed and he is only taking 1 and occasionally Lomotil per day.  Will try increasing Creon to 3 pills with meals to see if this provides any additional improvement.  IDA: Patient was noted to have persistent anemia since July 2023 with hemoglobin down as low as 7.5.  In July 2023, iron 40 (L), TIBC 26 (L), ferritin 859 (H) - possibly secondary to chronic alcohol use, vitamin B12 1070, Folate 16.4. He underwent EGD at that time with  gastropathy. Last colonoscopy in 2021 with hyperplastic and inflammatory polyp removed. Colon biopsies negative.   He has been taking oral iron and hemoglobin improved to 14.2 in May.  We had previously recommended updating a colonoscopy to further evaluate anemia.  His EGD will also be repeated due to cirrhosis with decompensating events earlier this year.  This will also serve to reevaluate anemia.  Procedures have been on hold due to uncontrolled diabetes.  Will have to continue to hold on this until he has follow-up with endocrinology in September.  Differentials include gastritis, duodenitis, PUD, PHG, AMVs, polyps, and malignancy.   Weight loss: Seems to have resolved.  Patient is gaining weight.  This may have be related to uncontrolled diabetes, possibly malabsorption in setting of EPI, now on Creon.  Some of his weight loss was also influenced by diuresis as he was volume overloaded in March.    Plan:  BMP Decrease Lasix to 20 mg once in the morning. Continue spironolactone 50 mg daily. Strict 2 g sodium diet. Patient will let us know if he develops any recurrent lower extremity edema or abdominal distention. Increase Creon to 3 pills with meals 3 times a day and 1 pill with snacks up to twice a day.  Patient is going to use his current prescription and  call with a progress report in 1-2 weeks.  If improvement in diarrhea, will send in new prescription. Continue Lomotil as needed. Continue taking iron for now. Keep upcoming appointment with endocrinology. Follow-up in mid October to discuss EGD and colonoscopy.   Ermalinda Memos, PA-C [facility] 05/06/2023

## 2023-05-06 ENCOUNTER — Ambulatory Visit: Payer: Medicare Other | Admitting: Gastroenterology

## 2023-05-06 ENCOUNTER — Encounter: Payer: Self-pay | Admitting: Gastroenterology

## 2023-05-06 VITALS — BP 131/88 | HR 118 | Temp 97.9°F | Ht 74.0 in | Wt 138.0 lb

## 2023-05-06 DIAGNOSIS — R634 Abnormal weight loss: Secondary | ICD-10-CM

## 2023-05-06 DIAGNOSIS — R197 Diarrhea, unspecified: Secondary | ICD-10-CM | POA: Diagnosis not present

## 2023-05-06 DIAGNOSIS — D649 Anemia, unspecified: Secondary | ICD-10-CM | POA: Diagnosis not present

## 2023-05-06 DIAGNOSIS — K7031 Alcoholic cirrhosis of liver with ascites: Secondary | ICD-10-CM

## 2023-05-06 DIAGNOSIS — E871 Hypo-osmolality and hyponatremia: Secondary | ICD-10-CM

## 2023-05-06 NOTE — Patient Instructions (Signed)
Please have blood work completed at [facility].  Decrease Lasix to 20 mg once in the morning and continue spironolactone 50 mg daily.  Limit your sodium intake to no more than 2000 mg of sodium per 24 hours.  If you begin to notice any recurrent swelling with decreasing Lasix, please let me know.   Increase Creon to 3 pills with meals 3 times a day and 1 pill with snacks up to 2 times daily.  Please call in 1-2 weeks to let me know how this is working for you.  If it works well, I will send in a new prescription.  Continue Lomotil as needed.    Continue taking iron daily.   We will plan to see back in mid October to discuss scheduling EGD and colonoscopy.   It was great to see you again today!  Do not hesitate to call if you have any questions or concerns prior to your next visit.  Ermalinda Memos, PA-C [facility]

## 2023-05-20 ENCOUNTER — Encounter (INDEPENDENT_AMBULATORY_CARE_PROVIDER_SITE_OTHER): Payer: Self-pay

## 2023-05-21 ENCOUNTER — Telehealth: Payer: Self-pay

## 2023-05-24 ENCOUNTER — Telehealth: Payer: Self-pay | Admitting: Internal Medicine

## 2023-05-24 NOTE — Telephone Encounter (Signed)
Todd Rodriguez with [facility] [employer] is calling and md need to call [phone] Division Vital Records in [place] to register in order to sign death certificate. Todd Rodriguez said they do long can use paper. This a time sensitive

## 2023-05-24 NOTE — Telephone Encounter (Signed)
Error/njr °

## 2023-05-25 NOTE — Telephone Encounter (Signed)
So [place]  can you or Lea  contact them and set up anything needed to register  so I can sign the death cert  ASAP?

## 2023-05-27 NOTE — Telephone Encounter (Signed)
Received death's certificate from [facility] this morning.   Form was filled and sign by provider.    Attempted to reach pt's spouse. Left a voicemail to call us back.

## 2023-05-28 NOTE — Telephone Encounter (Signed)
Takeya with [employer] called to ask if we could fax the signed death certificate to [phone]  Also, they are asking if we can please also mail back the hard copy in the self addressed envelope  the death certificate came with?  Please advise.

## 2023-05-31 ENCOUNTER — Ambulatory Visit: Payer: Self-pay | Admitting: Nurse Practitioner

## 2023-06-03 ENCOUNTER — Encounter: Payer: Self-pay | Admitting: Gastroenterology

## 2023-06-06 NOTE — Telephone Encounter (Signed)
Spoke to Optometrist.   He states pt was found dead last night to this morning. He ask if provider would sign a dead certificate.   Advise him provider is not in office, will be back on Tuesday. We can follow up with him.   Left contact #: [phone] to call him.   Please advise.

## 2023-06-06 NOTE — Telephone Encounter (Signed)
[facility]   [phone]  Asking that MD please sign the Death Certificate.

## 2023-06-06 DEATH — deceased

## 2023-06-14 ENCOUNTER — Ambulatory Visit: Payer: Self-pay | Admitting: Nurse Practitioner

## 2023-07-01 ENCOUNTER — Other Ambulatory Visit: Payer: Self-pay | Admitting: Gastroenterology

## 2023-07-01 DIAGNOSIS — K7031 Alcoholic cirrhosis of liver with ascites: Secondary | ICD-10-CM

## 2023-08-09 ENCOUNTER — Other Ambulatory Visit: Payer: Self-pay | Admitting: Internal Medicine

## 2023-11-02 ENCOUNTER — Telehealth: Payer: Self-pay | Admitting: Internal Medicine

## 2023-11-02 NOTE — Telephone Encounter (Signed)
Patient dropped off document  healthcare provider statement , to be filled out by provider. Patient requested to send it back via Fax within 7-days. Document is located in providers tray at front office.Please advise at Mobile [phone] (mobile)

## 2023-11-02 NOTE — Telephone Encounter (Signed)
Wife, [facility], requested form to be fill out.  Type of form: cause of death.    Form was in provider's red folder.

## 2023-11-18 ENCOUNTER — Telehealth: Payer: Self-pay

## 2023-11-18 NOTE — Telephone Encounter (Signed)
Form for Cause of Death for pt was dropped off by Wife, [employer].   Form completed by provider and faxed in successfully.   Contacted wife and inform her of information above.  Wife verbalized understanding.

## 2023-11-22 NOTE — Telephone Encounter (Signed)
Form was faxed. See other encounter.
